# Patient Record
Sex: Female | Born: 1980 | Race: Black or African American | Hispanic: No | Marital: Married | Smoking: Former smoker
Health system: Southern US, Community
[De-identification: ages and names within clinical notes are randomized; demographics above are authoritative.]

## PROBLEM LIST (undated history)

## (undated) ENCOUNTER — Inpatient Hospital Stay (HOSPITAL_COMMUNITY): Payer: Self-pay

## (undated) DIAGNOSIS — D649 Anemia, unspecified: Secondary | ICD-10-CM

## (undated) DIAGNOSIS — G43909 Migraine, unspecified, not intractable, without status migrainosus: Secondary | ICD-10-CM

## (undated) DIAGNOSIS — R87619 Unspecified abnormal cytological findings in specimens from cervix uteri: Secondary | ICD-10-CM

## (undated) DIAGNOSIS — IMO0002 Reserved for concepts with insufficient information to code with codable children: Secondary | ICD-10-CM

## (undated) DIAGNOSIS — E669 Obesity, unspecified: Secondary | ICD-10-CM

## (undated) DIAGNOSIS — D219 Benign neoplasm of connective and other soft tissue, unspecified: Secondary | ICD-10-CM

## (undated) DIAGNOSIS — F329 Major depressive disorder, single episode, unspecified: Secondary | ICD-10-CM

## (undated) DIAGNOSIS — R87629 Unspecified abnormal cytological findings in specimens from vagina: Secondary | ICD-10-CM

## (undated) DIAGNOSIS — F419 Anxiety disorder, unspecified: Secondary | ICD-10-CM

## (undated) DIAGNOSIS — F32A Depression, unspecified: Secondary | ICD-10-CM

## (undated) DIAGNOSIS — E049 Nontoxic goiter, unspecified: Secondary | ICD-10-CM

## (undated) DIAGNOSIS — N83209 Unspecified ovarian cyst, unspecified side: Secondary | ICD-10-CM

## (undated) DIAGNOSIS — K219 Gastro-esophageal reflux disease without esophagitis: Secondary | ICD-10-CM

## (undated) HISTORY — DX: Unspecified abnormal cytological findings in specimens from cervix uteri: R87.619

## (undated) HISTORY — PX: LAPAROSCOPIC GASTRIC SLEEVE RESECTION: SHX5895

## (undated) HISTORY — DX: Migraine, unspecified, not intractable, without status migrainosus: G43.909

## (undated) HISTORY — DX: Reserved for concepts with insufficient information to code with codable children: IMO0002

## (undated) HISTORY — DX: Obesity, unspecified: E66.9

## (undated) HISTORY — DX: Anxiety disorder, unspecified: F41.9

---

## 2002-09-11 ENCOUNTER — Encounter: Admission: RE | Admit: 2002-09-11 | Discharge: 2002-09-11 | Payer: Self-pay | Admitting: Obstetrics and Gynecology

## 2002-09-11 ENCOUNTER — Encounter (INDEPENDENT_AMBULATORY_CARE_PROVIDER_SITE_OTHER): Payer: Self-pay | Admitting: *Deleted

## 2002-09-11 ENCOUNTER — Other Ambulatory Visit: Admission: RE | Admit: 2002-09-11 | Discharge: 2002-09-11 | Payer: Self-pay | Admitting: Obstetrics and Gynecology

## 2002-10-02 ENCOUNTER — Encounter: Admission: RE | Admit: 2002-10-02 | Discharge: 2002-10-02 | Payer: Self-pay | Admitting: Obstetrics and Gynecology

## 2003-04-14 ENCOUNTER — Emergency Department (HOSPITAL_COMMUNITY): Admission: EM | Admit: 2003-04-14 | Discharge: 2003-04-14 | Payer: Self-pay | Admitting: Emergency Medicine

## 2004-08-16 ENCOUNTER — Emergency Department (HOSPITAL_COMMUNITY): Admission: EM | Admit: 2004-08-16 | Discharge: 2004-08-16 | Payer: Self-pay | Admitting: Emergency Medicine

## 2005-06-21 ENCOUNTER — Other Ambulatory Visit: Admission: RE | Admit: 2005-06-21 | Discharge: 2005-06-21 | Payer: Self-pay | Admitting: Obstetrics and Gynecology

## 2008-04-09 ENCOUNTER — Encounter (HOSPITAL_COMMUNITY): Admission: RE | Admit: 2008-04-09 | Discharge: 2008-04-10 | Payer: Self-pay | Admitting: Internal Medicine

## 2008-09-19 ENCOUNTER — Emergency Department (HOSPITAL_COMMUNITY): Admission: EM | Admit: 2008-09-19 | Discharge: 2008-09-19 | Payer: Self-pay | Admitting: *Deleted

## 2010-04-08 ENCOUNTER — Emergency Department (HOSPITAL_COMMUNITY): Admission: EM | Admit: 2010-04-08 | Discharge: 2010-04-08 | Payer: Self-pay | Admitting: Family Medicine

## 2010-04-21 ENCOUNTER — Emergency Department (HOSPITAL_COMMUNITY): Admission: EM | Admit: 2010-04-21 | Discharge: 2010-04-22 | Payer: Self-pay | Admitting: Emergency Medicine

## 2011-09-19 LAB — ACETAMINOPHEN LEVEL: Acetaminophen (Tylenol), Serum: <4 — ABNORMAL LOW

## 2011-09-19 LAB — POCT I-STAT, CHEM 8
BUN: 9
Calcium, Ion: 1.24
HCT: 40
Sodium: 140

## 2011-09-19 LAB — RAPID URINE DRUG SCREEN, HOSP PERFORMED
Amphetamines: NOT DETECTED
Barbiturates: NOT DETECTED
Benzodiazepines: NOT DETECTED
Cocaine: NOT DETECTED
Opiates: NOT DETECTED
Tetrahydrocannabinol: NOT DETECTED

## 2011-09-19 LAB — SALICYLATE LEVEL: Salicylate Lvl: <10

## 2011-09-19 LAB — POCT PREGNANCY, URINE: Preg Test, Ur: NEGATIVE

## 2011-09-19 LAB — TRICYCLICS SCREEN, URINE: TCA Scrn: NOT DETECTED

## 2012-10-04 ENCOUNTER — Ambulatory Visit (INDEPENDENT_AMBULATORY_CARE_PROVIDER_SITE_OTHER): Payer: BC Managed Care – PPO | Admitting: Family Medicine

## 2012-10-04 VITALS — Temp 98.1°F

## 2012-10-04 DIAGNOSIS — Z23 Encounter for immunization: Secondary | ICD-10-CM

## 2013-06-17 LAB — T4
T4, Total: 8.9
TSH: 0.23

## 2013-07-17 ENCOUNTER — Encounter: Payer: Self-pay | Admitting: Physician Assistant

## 2013-07-17 ENCOUNTER — Ambulatory Visit (INDEPENDENT_AMBULATORY_CARE_PROVIDER_SITE_OTHER): Payer: BC Managed Care – PPO | Admitting: Physician Assistant

## 2013-07-17 VITALS — BP 120/80 | HR 97 | Temp 98.0°F | Resp 16 | Ht 60.0 in | Wt 207.0 lb

## 2013-07-17 DIAGNOSIS — M62838 Other muscle spasm: Secondary | ICD-10-CM

## 2013-07-17 DIAGNOSIS — M357 Hypermobility syndrome: Secondary | ICD-10-CM

## 2013-07-17 DIAGNOSIS — E0590 Thyrotoxicosis, unspecified without thyrotoxic crisis or storm: Secondary | ICD-10-CM

## 2013-07-17 DIAGNOSIS — F419 Anxiety disorder, unspecified: Secondary | ICD-10-CM | POA: Insufficient documentation

## 2013-07-17 DIAGNOSIS — T148XXA Other injury of unspecified body region, initial encounter: Secondary | ICD-10-CM

## 2013-07-17 DIAGNOSIS — E669 Obesity, unspecified: Secondary | ICD-10-CM | POA: Insufficient documentation

## 2013-07-17 DIAGNOSIS — G43909 Migraine, unspecified, not intractable, without status migrainosus: Secondary | ICD-10-CM | POA: Insufficient documentation

## 2013-07-17 MED ORDER — MELOXICAM 15 MG PO TABS: 15.0000 mg | ORAL_TABLET | Freq: Every day | ORAL | Status: DC

## 2013-07-17 MED ORDER — HYDROCODONE-ACETAMINOPHEN 5-325 MG PO TABS: 1.0000 | ORAL_TABLET | Freq: Four times a day (QID) | ORAL | Status: DC | PRN

## 2013-07-17 NOTE — Progress Notes (Signed)
Subjective:    Patient ID: Jessica Banks, female    DOB: [birth year], 32 y.o.   MRN: [id number]  HPI 32 y.o. Female presents to clinic today for follow up on abnormal TSH and evaluation of muscle spasms.   Pts TSH was drawn on 06/17/13 and was 0.230. Normal T4. Lab performed as part of workup for urticaria.  Pt does not complain of any chest pain, palpitations, heat or cold intolerance, difficulty urinating or moving her bowels, hot flashes, skin changes, menstrual problems, or changes in vision. She does have a significant family history of hyperthyroidism. Patient expresses that she has always had a palpable thyroid but this was first time her labs have been abnormal.   Patient was doing crossfit 4 days ago and states that she was doing a new exercise and went home and her arm started hurting. She then went to a workout the next day that required similar exercises and her arm has continued to get more painful. She took some Flexeril and 800 mg of Motrin with only mild relief. Patient is not able to use left arm due to pain and muscle spasms.   Patient doing well otherwise. She follows Neurologist for migraine care.    Review of Systems  Constitutional: Negative for fever, chills, activity change, appetite change, fatigue and unexpected weight change.  Eyes: Negative for visual disturbance.  Respiratory: Negative for cough, chest tightness and shortness of breath.   Cardiovascular: Negative for chest pain, palpitations and leg swelling.  Gastrointestinal: Negative for nausea, vomiting, abdominal pain, diarrhea and constipation.  Genitourinary: Negative for difficulty urinating and menstrual problem.  Musculoskeletal: Positive for myalgias. Negative for arthralgias.  Skin: Negative for pallor and rash.  Neurological: Positive for headaches (history of migraines). Negative for speech difficulty, weakness and light-headedness.  Psychiatric/Behavioral: Negative for sleep disturbance.  All  other systems reviewed and are negative.  Past medical history, surgical history, family history, social history and problem list reviewed.      Objective:   Physical Exam  Nursing note and vitals reviewed. Constitutional: She is oriented to person, place, and time. She appears well-developed and well-nourished. No distress.  HENT:  Head: Normocephalic and atraumatic.  Right Ear: External ear normal.  Left Ear: External ear normal.  Eyes: Conjunctivae are normal.  Neck: Normal range of motion. Neck supple. Thyromegaly present.  Cardiovascular: Normal rate, regular rhythm, normal heart sounds and intact distal pulses.   Pulmonary/Chest: Effort normal and breath sounds normal.  Musculoskeletal:       Left shoulder: She exhibits decreased range of motion (2nd to pain and muscle spasms), pain and spasm. She exhibits no tenderness, no swelling, normal pulse and normal strength.       Left upper arm: She exhibits tenderness (2nd to muslce spasms).  Lymphadenopathy:    She has no cervical adenopathy.  Neurological: She is alert and oriented to person, place, and time. She has normal strength. No cranial nerve deficit or sensory deficit.  Skin: Skin is warm and dry. She is not diaphoretic.  Psychiatric: She has a normal mood and affect. Her speech is normal and behavior is normal. Judgment and thought content normal. Cognition and memory are normal.        Assessment & Plan:  Hyperthyroidism - Plan: US Soft Tissue Head/Neck, TSH. Will contact patient with results. Patient referred for [place] and once results are will discuss next step in care with patient .   Muscle strain/spasms - Plan: meloxicam (MOBIC) 15 MG tablet, HYDROcodone-acetaminophen (  NORCO) 5-325 MG per tablet. Pt to continue using Flexeril before bed time for the next week. She was instructed to rest and not perform any upper body crossfit exercises for the next week until her arm is better. Advised use of warm compresses on the  sore areas and told her that a massage would greatly help.  F/u as needed or if pain continues in left arm. Will contact her with her lab results.

## 2013-07-17 NOTE — Patient Instructions (Addendum)
Hold off on upper body work until your pain is resolved. Take the flexeril at bedtime. Use warm compresses on the sore areas. Get a massage!

## 2013-07-19 NOTE — Progress Notes (Signed)
I have examined this patient along with the student and agree.  

## 2013-07-25 ENCOUNTER — Other Ambulatory Visit: Payer: Self-pay

## 2013-12-07 ENCOUNTER — Ambulatory Visit (INDEPENDENT_AMBULATORY_CARE_PROVIDER_SITE_OTHER): Payer: BC Managed Care – PPO | Admitting: Physician Assistant

## 2013-12-07 VITALS — BP 122/78 | HR 104 | Temp 98.3°F | Resp 18 | Ht 60.0 in | Wt 209.0 lb

## 2013-12-07 DIAGNOSIS — L500 Allergic urticaria: Secondary | ICD-10-CM

## 2013-12-07 DIAGNOSIS — L282 Other prurigo: Secondary | ICD-10-CM

## 2013-12-07 MED ORDER — HYDROXYZINE HCL 25 MG PO TABS: 25.0000 mg | ORAL_TABLET | Freq: Three times a day (TID) | ORAL | Status: DC | PRN

## 2013-12-07 MED ORDER — PREDNISONE 20 MG PO TABS: ORAL_TABLET | ORAL | Status: DC

## 2013-12-07 MED ORDER — METHYLPREDNISOLONE SODIUM SUCC 125 MG IJ SOLR
125.0000 mg | Freq: Once | INTRAMUSCULAR | Status: AC
  Administered 2013-12-07: 125 mg via INTRAMUSCULAR

## 2013-12-07 NOTE — Progress Notes (Signed)
   Subjective:    Patient ID: Jessica Banks, female    DOB: August 11, 1981, 32 y.o.   MRN: [id number]  Rash Pertinent negatives include no fever, shortness of breath or vomiting.   32 year old female presents for evaluation of pruritic rash/hives x 2 days. Has hx of similar rash over the past few years. Has been seen by an allergist and had extensive allergy testing that was inconclusive. They were unable to identify a specific allergen, and she was diagnosed with "sensitive skin." No hx of anaphylaxis.   Current episode started yesterday with slight itching that she noticed when she woke up. Over the course of the day it got progressively worse and began spreading.  Has multiple whelps on bilateral arms and trunk.  Denies SOB, wheezing, lip/tongue swelling, or throat swelling.  She has taken Benadryl, Zyrtec and used OTC hydrocortisone cream.   Patient is otherwise doing well with no other concerns today.     Review of Systems  Constitutional: Negative for fever and chills.  HENT: Negative for trouble swallowing.   Respiratory: Negative for chest tightness, shortness of breath and wheezing.   Gastrointestinal: Negative for nausea and vomiting.  Skin: Positive for rash.       Objective:   Physical Exam  Constitutional: She is oriented to person, place, and time. She appears well-developed and well-nourished.  HENT:  Head: Normocephalic and atraumatic.  Right Ear: External ear normal.  Left Ear: External ear normal.  Eyes: Conjunctivae are normal.  Neck: Normal range of motion.  Cardiovascular: Normal rate.   Pulmonary/Chest: Effort normal.  Neurological: She is alert and oriented to person, place, and time.  Skin:  Diffuse urticaria over entire torso and bilateral arms. No vesicles, drainage, or pain  Psychiatric: She has a normal mood and affect. Her behavior is normal. Judgment and thought content normal.          Assessment & Plan:  Allergic urticaria - Plan:  methylPREDNISolone sodium succinate (SOLU-MEDROL) 125 mg/2 mL injection 125 mg, predniSONE (DELTASONE) 20 MG tablet, hydrOXYzine (ATARAX/VISTARIL) 25 MG tablet  Pruritic rash - Plan: predniSONE (DELTASONE) 20 MG tablet  Solumedrol 125 mg IM today Prednisone taper to begin tomorrow Continue Zyrtec daily. Change form Benadryl to Atarax at bedtime Follow up or go to ER with any acutely worsening sx's, or if she fails to improve.

## 2015-04-01 ENCOUNTER — Telehealth: Payer: Self-pay

## 2015-04-01 NOTE — Telephone Encounter (Signed)
Patient called into voicemail on Thursday at 812am wanting to know if we had received her faxed [facility] for her records, she stated that she had sent them 24 hours prior to the call. As of 9:43am I have not seen it. Will wait and see if it comes in later today.

## 2015-04-02 NOTE — Telephone Encounter (Signed)
Request has been received and records mailed on 03/31/15. Patient notified.

## 2015-12-19 HISTORY — PX: LAPAROSCOPIC GASTRIC SLEEVE RESECTION: SHX5895

## 2016-04-04 DIAGNOSIS — Z9884 Bariatric surgery status: Secondary | ICD-10-CM | POA: Insufficient documentation

## 2016-04-06 ENCOUNTER — Ambulatory Visit (INDEPENDENT_AMBULATORY_CARE_PROVIDER_SITE_OTHER): Payer: No Typology Code available for payment source | Admitting: Physician Assistant

## 2016-04-06 VITALS — BP 108/66 | HR 91 | Temp 97.9°F | Resp 18 | Ht 60.0 in | Wt 223.0 lb

## 2016-04-06 DIAGNOSIS — E860 Dehydration: Secondary | ICD-10-CM

## 2016-04-06 DIAGNOSIS — N390 Urinary tract infection, site not specified: Secondary | ICD-10-CM

## 2016-04-06 DIAGNOSIS — R82998 Other abnormal findings in urine: Secondary | ICD-10-CM

## 2016-04-06 DIAGNOSIS — R34 Anuria and oliguria: Secondary | ICD-10-CM | POA: Diagnosis not present

## 2016-04-06 DIAGNOSIS — Z9889 Other specified postprocedural states: Secondary | ICD-10-CM

## 2016-04-06 DIAGNOSIS — R109 Unspecified abdominal pain: Secondary | ICD-10-CM | POA: Diagnosis not present

## 2016-04-06 LAB — POCT URINALYSIS DIP (MANUAL ENTRY)
BILIRUBIN UA: NEGATIVE
GLUCOSE UA: NEGATIVE
GLUCOSE UA: NEGATIVE
Ketones, POC UA: NEGATIVE
NITRITE UA: NEGATIVE
NITRITE UA: NEGATIVE
Protein Ur, POC: NEGATIVE
SPEC GRAV UA: 1.025
SPEC GRAV UA: 1.02
UROBILINOGEN UA: 0.2
Urobilinogen, UA: 0.2
pH, UA: 5
pH, UA: 5.5

## 2016-04-06 LAB — COMPLETE METABOLIC PANEL WITH GFR
ALT: 9 U/L (ref 6–29)
AST: 14 U/L (ref 10–30)
Albumin: 3.8 g/dL (ref 3.6–5.1)
Alkaline Phosphatase: 100 U/L (ref 33–115)
BUN: 8 mg/dL (ref 7–25)
CHLORIDE: 106 mmol/L (ref 98–110)
CO2: 22 mmol/L (ref 20–31)
Calcium: 9.4 mg/dL (ref 8.6–10.2)
Creat: 0.6 mg/dL (ref 0.50–1.10)
Glucose, Bld: 90 mg/dL (ref 65–99)
POTASSIUM: 4 mmol/L (ref 3.5–5.3)
Sodium: 137 mmol/L (ref 135–146)
Total Bilirubin: 0.4 mg/dL (ref 0.2–1.2)
Total Protein: 6.7 g/dL (ref 6.1–8.1)

## 2016-04-06 LAB — POCT CBC
Granulocyte percent: 49.3 %G (ref 37–80)
HEMATOCRIT: 35.6 % — AB (ref 37.7–47.9)
HEMOGLOBIN: 12.2 g/dL (ref 12.2–16.2)
Lymph, poc: 2.3 (ref 0.6–3.4)
MCH: 29.3 pg (ref 27–31.2)
MCHC: 34.4 g/dL (ref 31.8–35.4)
MCV: 85.4 fL (ref 80–97)
MID (cbc): 0.4 (ref 0–0.9)
MPV: 8.5 fL (ref 0–99.8)
POC GRANULOCYTE: 2.6 (ref 2–6.9)
POC LYMPH PERCENT: 42.7 %L (ref 10–50)
POC MID %: 8 % (ref 0–12)
Platelet Count, POC: 253 10*3/uL (ref 142–424)
RBC: 4.17 M/uL (ref 4.04–5.48)
RDW, POC: 14.2 %
WBC: 5.3 10*3/uL (ref 4.6–10.2)

## 2016-04-06 LAB — POC MICROSCOPIC URINALYSIS (UMFC): MUCUS RE: ABSENT

## 2016-04-06 NOTE — Progress Notes (Signed)
04/07/2016 1:50 PM   DOB: December 26, 1980 / MRN: [id number]  SUBJECTIVE:  Jessica Banks is a 35 y.o. female presenting for right sided flank pain.  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  She feels that she is dehydrated and and thinks this is contributing to her side pain.  She had mentioned this to her surgeon at routine follow up and feels that he did not listen to her and advised her to drink more fluids.  She feels now that she is so far behind that she can't catch up.  She would like labs today to investigate why she feels so bad and why she may be having flank pain.   She is allergic to novocain.   She  has a past medical history of Anxiety; Migraine; Obesity; and Abnormal Pap smear.    She  reports that she quit smoking about 2 years ago. She does not have any smokeless tobacco history on file. She reports that she does not drink alcohol or use illicit drugs. She  reports that she currently engages in sexual activity. The patient  has no past surgical history on file.  Her family history includes Thyroid disease in her mother.  Review of Systems  Constitutional: Negative for fever and chills.  Cardiovascular: Negative for chest pain.  Gastrointestinal: Positive for abdominal pain. Negative for nausea, vomiting and diarrhea.  Genitourinary: Positive for flank pain. Negative for dysuria, urgency, frequency and hematuria.  Skin: Negative for rash.  Neurological: Positive for dizziness. Negative for headaches.    Problem list and medications reviewed and updated by myself where necessary, and exist elsewhere in the encounter.   OBJECTIVE:  BP 108/66 mmHg  Pulse 91  Temp(Src) 97.9 F (36.6 C)  Resp 18  Ht 5' (1.524 m)  Wt 223 lb (101.152 kg)  BMI 43.55 kg/m2  SpO2 98%  LMP 03/06/2016 (Approximate)  Physical Exam  Constitutional: She is oriented to person, place, and time. She appears well-nourished. No distress.  Eyes: EOM are normal. Pupils are equal, round,  and reactive to light.  Cardiovascular: Normal rate.   Pulmonary/Chest: Effort normal and breath sounds normal.  Abdominal: Bowel sounds are normal. She exhibits no distension.  Musculoskeletal: Normal range of motion.  Neurological: She is alert and oriented to person, place, and time. No cranial nerve deficit. Gait normal.  Skin: Skin is dry. She is not diaphoretic.  Psychiatric: She has a normal mood and affect.  Vitals reviewed.   Results for orders placed or performed in visit on 04/06/16 (from the past 72 hour(s))  POCT urinalysis dipstick     Status: Abnormal   Collection Time: 04/06/16  4:39 PM  Result Value Ref Range   Color, UA yellow yellow   Clarity, UA clear clear   Glucose, UA negative negative   Bilirubin, UA small (A) negative   Ketones, POC UA negative negative   Spec Grav, UA 1.025    Blood, UA trace-lysed (A) negative   pH, UA 5.0    Protein Ur, POC trace (A) negative   Urobilinogen, UA 0.2    Nitrite, UA Negative Negative   Leukocytes, UA Trace (A) Negative  POCT Microscopic Urinalysis ([facility])     Status: Abnormal   Collection Time: 04/06/16  4:44 PM  Result Value Ref Range   WBC,UR,HPF,POC Moderate (A) None WBC/hpf   RBC,UR,HPF,POC Few (A) None RBC/hpf   Bacteria Few (A) None, Too numerous to count   Mucus Absent Absent   Epithelial  Cells, UR Per Microscopy Few (A) None, Too numerous to count cells/hpf  COMPLETE METABOLIC PANEL WITH GFR     Status: None   Collection Time: 04/06/16  5:00 PM  Result Value Ref Range   Sodium 137 135 - 146 mmol/L   Potassium 4.0 3.5 - 5.3 mmol/L   Chloride 106 98 - 110 mmol/L   CO2 22 20 - 31 mmol/L   Glucose, Bld 90 65 - 99 mg/dL   BUN 8 7 - 25 mg/dL   Creat 6.16 8.15 - 8.10 mg/dL   Total Bilirubin 0.4 0.2 - 1.2 mg/dL   Alkaline Phosphatase 100 33 - 115 U/L   AST 14 10 - 30 U/L   ALT 9 6 - 29 U/L   Total Protein 6.7 6.1 - 8.1 g/dL   Albumin 3.8 3.6 - 5.1 g/dL   Calcium 9.4 8.6 - 04.2 mg/dL   GFR, Est African  American >89 >=60 mL/min   GFR, Est Non African American >89 >=60 mL/min    Comment:   The estimated GFR is a calculation valid for adults (>=36 years old) that uses the CKD-EPI algorithm to adjust for age and sex. It is   not to be used for children, pregnant women, hospitalized patients,    patients on dialysis, or with rapidly changing kidney function. According to the NKDEP, eGFR >89 is normal, 60-89 shows mild impairment, 30-59 shows moderate impairment, 15-29 shows severe impairment and <15 is ESRD.     POCT CBC     Status: Abnormal   Collection Time: 04/06/16  5:05 PM  Result Value Ref Range   WBC 5.3 4.6 - 10.2 K/uL   Lymph, poc 2.3 0.6 - 3.4   POC LYMPH PERCENT 42.7 10 - 50 %L   MID (cbc) 0.4 0 - 0.9   POC MID % 8.0 0 - 12 %M   POC Granulocyte 2.6 2 - 6.9   Granulocyte percent 49.3 37 - 80 %G   RBC 4.17 4.04 - 5.48 M/uL   Hemoglobin 12.2 12.2 - 16.2 g/dL   HCT, POC 24.0 (A) 82.0 - 47.9 %   MCV 85.4 80 - 97 fL   MCH, POC 29.3 27 - 31.2 pg   MCHC 34.4 31.8 - 35.4 g/dL   RDW, POC 64.9 %   Platelet Count, POC 253 142 - 424 K/uL   MPV 8.5 0 - 99.8 fL  POCT urinalysis dipstick     Status: Abnormal   Collection Time: 04/06/16  6:17 PM  Result Value Ref Range   Color, UA yellow yellow   Clarity, UA clear clear   Glucose, UA negative negative   Bilirubin, UA negative negative   Ketones, POC UA small (15) (A) negative   Spec Grav, UA 1.020    Blood, UA trace-lysed (A) negative   pH, UA 5.5    Protein Ur, POC negative negative   Urobilinogen, UA 0.2    Nitrite, UA Negative Negative   Leukocytes, UA small (1+) (A) Negative   Orthostatic VS for the past 24 hrs:  BP- Lying Pulse- Lying BP- Sitting Pulse- Sitting BP- Standing at 0 minutes Pulse- Standing at 0 minutes  04/06/16 1636 109/75 mmHg 82 131/83 mmHg 88 119/82 mmHg 92    No results found.  ASSESSMENT AND PLAN  Jessica Banks was seen today for flank pain and back pain.  Diagnoses and all orders for this  visit:  Flank pain: My workup has revealed very little aside from dehyrdation. Her flank  could be caused by a kidney stone, constipation, gall bladder disease, or possibly adhesions.  Her work up shows nothing urgent here.  Advised that she focus on hydration at home and start a bowel prep to get constipation off of the table.  If her pain continues and her urine culture comes back normal will order a CT of the abdomen to attempt to find an underlying cause.         -     POCT urinalysis dipstick -     POCT CBC -     COMPLETE METABOLIC PANEL WITH GFR -     POCT Microscopic Urinalysis ([facility])  S/P gastric surgery: See problem one.   Oligouria: See problem one.  -     Orthostatic vital signs  Dehydration: See problem one.   Urine leukocytes increased -     Urine culture -     POCT urinalysis dipstick    The patient was advised to call or return to clinic if she does not see an improvement in symptoms or to seek the care of the closest emergency department if she worsens with the above plan.   Philis Fendt, MHS, PA-C [facility] 04/07/2016 1:50 PM

## 2016-04-06 NOTE — Patient Instructions (Signed)
     IF you received an x-ray today, you will receive an invoice from [facility]. Please contact [facility] at [phone] with questions or concerns regarding your invoice.   IF you received labwork today, you will receive an invoice from [facility]. Please contact [facility] at [phone] with questions or concerns regarding your invoice.   Our billing staff will not be able to assist you with questions regarding bills from these companies.  You will be contacted with the lab results as soon as they are available. The fastest way to get your results is to activate your My Chart account. Instructions are located on the last page of this paperwork. If you have not heard from us regarding the results in 2 weeks, please contact this office.      

## 2016-04-07 LAB — URINE CULTURE
COLONY COUNT: NO GROWTH
Organism ID, Bacteria: NO GROWTH

## 2016-04-10 ENCOUNTER — Ambulatory Visit (INDEPENDENT_AMBULATORY_CARE_PROVIDER_SITE_OTHER): Payer: No Typology Code available for payment source | Admitting: Physician Assistant

## 2016-04-10 ENCOUNTER — Telehealth: Payer: Self-pay

## 2016-04-10 ENCOUNTER — Emergency Department (HOSPITAL_BASED_OUTPATIENT_CLINIC_OR_DEPARTMENT_OTHER): Payer: PRIVATE HEALTH INSURANCE

## 2016-04-10 ENCOUNTER — Encounter (HOSPITAL_BASED_OUTPATIENT_CLINIC_OR_DEPARTMENT_OTHER): Payer: Self-pay | Admitting: *Deleted

## 2016-04-10 ENCOUNTER — Emergency Department (HOSPITAL_BASED_OUTPATIENT_CLINIC_OR_DEPARTMENT_OTHER)
Admission: EM | Admit: 2016-04-10 | Discharge: 2016-04-10 | Disposition: A | Discharge status: Home / Self Care | Payer: PRIVATE HEALTH INSURANCE | Attending: Emergency Medicine | Admitting: Emergency Medicine

## 2016-04-10 VITALS — BP 122/80 | HR 94 | Temp 98.2°F | Resp 18 | Wt 224.0 lb

## 2016-04-10 DIAGNOSIS — R109 Unspecified abdominal pain: Secondary | ICD-10-CM

## 2016-04-10 DIAGNOSIS — R10A0 Flank pain, unspecified side: Secondary | ICD-10-CM

## 2016-04-10 DIAGNOSIS — Z87891 Personal history of nicotine dependence: Secondary | ICD-10-CM | POA: Diagnosis not present

## 2016-04-10 DIAGNOSIS — E860 Dehydration: Secondary | ICD-10-CM | POA: Diagnosis not present

## 2016-04-10 DIAGNOSIS — Z9889 Other specified postprocedural states: Secondary | ICD-10-CM

## 2016-04-10 DIAGNOSIS — E669 Obesity, unspecified: Secondary | ICD-10-CM | POA: Insufficient documentation

## 2016-04-10 DIAGNOSIS — M549 Dorsalgia, unspecified: Secondary | ICD-10-CM | POA: Diagnosis present

## 2016-04-10 DIAGNOSIS — R1011 Right upper quadrant pain: Secondary | ICD-10-CM | POA: Insufficient documentation

## 2016-04-10 LAB — POCT URINALYSIS DIP (MANUAL ENTRY)
BILIRUBIN UA: NEGATIVE
Blood, UA: NEGATIVE
Glucose, UA: NEGATIVE
LEUKOCYTES UA: NEGATIVE
Nitrite, UA: NEGATIVE
PH UA: 6
Protein Ur, POC: NEGATIVE
Spec Grav, UA: 1.02
Urobilinogen, UA: 1

## 2016-04-10 LAB — COMPREHENSIVE METABOLIC PANEL
ALT: 14 U/L (ref 14–54)
AST: 25 U/L (ref 15–41)
Albumin: 4.1 g/dL (ref 3.5–5.0)
Alkaline Phosphatase: 99 U/L (ref 38–126)
Anion gap: 11 (ref 5–15)
BILIRUBIN TOTAL: 0.5 mg/dL (ref 0.3–1.2)
BUN: 13 mg/dL (ref 6–20)
CO2: 18 mmol/L — ABNORMAL LOW (ref 22–32)
CREATININE: 0.7 mg/dL (ref 0.44–1.00)
Calcium: 9.4 mg/dL (ref 8.9–10.3)
Chloride: 107 mmol/L (ref 101–111)
Glucose, Bld: 88 mg/dL (ref 65–99)
POTASSIUM: 3.7 mmol/L (ref 3.5–5.1)
Sodium: 136 mmol/L (ref 135–145)
TOTAL PROTEIN: 8 g/dL (ref 6.5–8.1)

## 2016-04-10 LAB — POCT CBC
GRANULOCYTE PERCENT: 53.6 % (ref 37–80)
HCT, POC: 37.5 % — AB (ref 37.7–47.9)
Hemoglobin: 12.9 g/dL (ref 12.2–16.2)
Lymph, poc: 2.2 (ref 0.6–3.4)
MCH, POC: 29.4 pg (ref 27–31.2)
MCHC: 34.5 g/dL (ref 31.8–35.4)
MCV: 85.4 fL (ref 80–97)
MID (cbc): 0.4 (ref 0–0.9)
MPV: 8.8 fL (ref 0–99.8)
PLATELET COUNT, POC: 265 10*3/uL (ref 142–424)
POC Granulocyte: 3 (ref 2–6.9)
POC LYMPH %: 39.3 % (ref 10–50)
POC MID %: 7.1 %M (ref 0–12)
RBC: 4.39 M/uL (ref 4.04–5.48)
RDW, POC: 14.3 %
WBC: 5.6 10*3/uL (ref 4.6–10.2)

## 2016-04-10 LAB — LIPASE: LIPASE: 12 U/L (ref 7–60)

## 2016-04-10 LAB — LIPASE, BLOOD: LIPASE: 19 U/L (ref 11–51)

## 2016-04-10 LAB — POCT URINE PREGNANCY: PREG TEST UR: NEGATIVE

## 2016-04-10 MED ORDER — SULFAMETHOXAZOLE-TRIMETHOPRIM 800-160 MG PO TABS: 1.0000 | ORAL_TABLET | Freq: Two times a day (BID) | ORAL | Status: AC

## 2016-04-10 MED ORDER — SODIUM CHLORIDE 0.9 % IV BOLUS (SEPSIS)
1000.0000 mL | Freq: Once | INTRAVENOUS | Status: AC
  Administered 2016-04-10: 1000 mL via INTRAVENOUS

## 2016-04-10 MED ORDER — ONDANSETRON HCL 4 MG/2ML IJ SOLN
4.0000 mg | Freq: Once | INTRAMUSCULAR | Status: AC
  Administered 2016-04-10: 4 mg via INTRAVENOUS

## 2016-04-10 MED ORDER — SULFAMETHOXAZOLE-TRIMETHOPRIM 800-160 MG PO TABS
1.0000 | ORAL_TABLET | Freq: Once | ORAL | Status: AC
  Administered 2016-04-10: 1 via ORAL
  Filled 2016-04-10: qty 1

## 2016-04-10 MED ORDER — TRAMADOL HCL 50 MG PO TABS: 50.0000 mg | ORAL_TABLET | Freq: Three times a day (TID) | ORAL | Status: DC | PRN

## 2016-04-10 MED ORDER — SODIUM CHLORIDE 0.9 % IV BOLUS (SEPSIS)
1000.0000 mL | Freq: Once | INTRAVENOUS | Status: DC
Start: 2016-04-10 — End: 2016-04-11

## 2016-04-10 MED ORDER — ONDANSETRON HCL 4 MG/2ML IJ SOLN
INTRAMUSCULAR | Status: AC
  Filled 2016-04-10: qty 2

## 2016-04-10 MED ORDER — IOPAMIDOL (ISOVUE-300) INJECTION 61%
100.0000 mL | Freq: Once | INTRAVENOUS | Status: AC | PRN
  Administered 2016-04-10: 100 mL via INTRAVENOUS

## 2016-04-10 NOTE — ED Notes (Signed)
MD at bedside. 

## 2016-04-10 NOTE — Telephone Encounter (Signed)
Returned call and spoke with Coralyn Mark. She needed facility details, she was given [facility] imaging facility. Coralyn Mark will get that entered and this may take up to 72 hours. Philis Fendt PA-C advised. Called patient, left message, with  details

## 2016-04-10 NOTE — ED Notes (Signed)
IV attempts x2  1 each AC labs obtained right [facility] sent to lab post assessment each site WNL.

## 2016-04-10 NOTE — Telephone Encounter (Signed)
Jessica Banks is calling Jessica Banks back to do an emergency prior authorization. Please call! [phone]

## 2016-04-10 NOTE — Progress Notes (Signed)
04/10/2016 2:27 PM   DOB: 1981/04/30 / MRN: [id number]  SUBJECTIVE:  Jessica Banks is a 35 y.o. female presenting for a follow up of flank pain. She is roughly thee weeks out of bariatric surgery and reports the pain is progressively worsening.  Complains that the pain is constant, dull, and sometimes stabbing in nature.  Denies fever, chills, nausea.  She is a non drinker.  Reports she is moving her bowels without difficulty today and has been taking OTC laxatives with excellent results.  She is pushing PO fluids and is urinating 4-5 times daily and reports the urine is clear and without odor.    She is allergic to novocain.   She  has a past medical history of Anxiety; Migraine; Obesity; and Abnormal Pap smear.    She  reports that she quit smoking about 2 years ago. She does not have any smokeless tobacco history on file. She reports that she does not drink alcohol or use illicit drugs. She  reports that she currently engages in sexual activity. The patient  has no past surgical history on file.  Her family history includes Thyroid disease in her mother.  Review of Systems  Constitutional: Negative for fever and chills.  Respiratory: Negative for cough.   Cardiovascular: Negative for chest pain.  Gastrointestinal: Negative for diarrhea and constipation.  Genitourinary: Positive for flank pain. Negative for dysuria, urgency and frequency.  Skin: Negative for rash.  Neurological: Negative for dizziness and headaches.    Problem list and medications reviewed and updated by myself where necessary, and exist elsewhere in the encounter.   OBJECTIVE:  BP 122/80 mmHg  Pulse 94  Temp(Src) 98.2 F (36.8 C) (Oral)  Resp 18  Wt 224 lb (101.606 kg)  SpO2 96%  LMP 03/06/2016 (Approximate)  Physical Exam  Constitutional: She is oriented to person, place, and time. She appears well-nourished. No distress.  Eyes: EOM are normal. Pupils are equal, round, and reactive to light.    Cardiovascular: Normal rate, regular rhythm and normal heart sounds.   Pulmonary/Chest: Effort normal.  Abdominal: Soft. Bowel sounds are normal. She exhibits no distension and no mass. There is no tenderness. There is no rebound and no guarding.  Neurological: She is alert and oriented to person, place, and time. No cranial nerve deficit. Gait normal.  Skin: Skin is dry. She is not diaphoretic.  Psychiatric: She has a normal mood and affect.  Vitals reviewed.   Results for orders placed or performed in visit on 04/10/16 (from the past 72 hour(s))  POCT urinalysis dipstick     Status: Abnormal   Collection Time: 04/10/16  2:24 PM  Result Value Ref Range   Color, UA yellow yellow   Clarity, UA clear clear   Glucose, UA negative negative   Bilirubin, UA negative negative   Ketones, POC UA small (15) (A) negative   Spec Grav, UA 1.020    Blood, UA negative negative   pH, UA 6.0    Protein Ur, POC negative negative   Urobilinogen, UA 1.0    Nitrite, UA Negative Negative   Leukocytes, UA Negative Negative  POCT CBC     Status: Abnormal   Collection Time: 04/10/16  2:24 PM  Result Value Ref Range   WBC 5.6 4.6 - 10.2 K/uL   Lymph, poc 2.2 0.6 - 3.4   POC LYMPH PERCENT 39.3 10 - 50 %L   MID (cbc) 0.4 0 - 0.9   POC MID % 7.1 0 -  12 %M   POC Granulocyte 3.0 2 - 6.9   Granulocyte percent 53.6 37 - 80 %G   RBC 4.39 4.04 - 5.48 M/uL   Hemoglobin 12.9 12.2 - 16.2 g/dL   HCT, POC 37.5 (A) 37.7 - 47.9 %   MCV 85.4 80 - 97 fL   MCH, POC 29.4 27 - 31.2 pg   MCHC 34.5 31.8 - 35.4 g/dL   RDW, POC 14.3 %   Platelet Count, POC 265 142 - 424 K/uL   MPV 8.8 0 - 99.8 fL  POCT urine pregnancy     Status: None   Collection Time: 04/10/16  2:26 PM  Result Value Ref Range   Preg Test, Ur Negative Negative    No results found.  ASSESSMENT AND PLAN  Jessica Banks was seen today for follow-up.  Diagnoses and all orders for this visit:  Flank pain -     Lipase -     POCT urinalysis  dipstick -     POCT CBC -     POCT urine pregnancy -     traMADol (ULTRAM) 50 MG tablet; Take 1-2 tablets (50-100 mg total) by mouth every 8 (eight) hours as needed. Okay to take with 1000 mg of tylenol every 8 hours. -     CT Abdomen Pelvis W Contrast; Future  Status post gastric surgery: I am concerned that her pain is worsening and I can not identify any particular etiology.  Will send her for a stat CT scan of the abdomen and pelvis.  This may reveal gall bladder disease, pancreatitis, or an unknown etiology related to the surgery.  -     CT Abdomen Pelvis W Contrast; Future  Other orders -     Discontinue: traMADol (ULTRAM) 50 MG tablet; Take 1 tablet (50 mg total) by mouth every 8 (eight) hours as needed.    The patient was advised to call or return to clinic if she does not see an improvement in symptoms or to seek the care of the closest emergency department if she worsens with the above plan.   Philis Fendt, MHS, PA-C [facility] 04/10/2016 2:27 PM

## 2016-04-10 NOTE — ED Notes (Signed)
Gastric sleeve surgery 4 weeks ago. Pain in her right upper quadrant.

## 2016-04-10 NOTE — ED Provider Notes (Signed)
CSN: [id number]     Arrival date & time 04/10/16  1814 History  By signing my name below, I, Doran Stabler, attest that this documentation has been prepared under the direction and in the presence of Julianne Rice, MD. Electronically Signed: Doran Stabler, ED Scribe. 04/10/2016. 8:30 PM.   Chief Complaint  Patient presents with  . Back Pain   The history is provided by the patient. No language interpreter was used.   HPI Comments: Jessica Banks is a 35 y.o. female who presents to the Emergency Department complaining of persistent RUQ pain s/p gastric bypass surgery last month. Pt states she had a gastric bypass surgery on 3/29//17. SheHas had persistent pain around the incision site in the right upper quadrant. She states the pain is constant and rates it 2/10. She also has episodic sharp pain that radiates to her right flank. Her pain is worse when she sits up. Denies hematuria, frequency, urgency or dysuria. States she's having regular bowel movements with no blood or mucus. Pt presents today with persistent pain. Pt denies any fevers, chills, cough, SOB, N/V/D or any other symptoms at this time.   Past Medical History  Diagnosis Date  . Anxiety   . Migraine   . Obesity   . Abnormal Pap smear     CIN I   Past Surgical History  Procedure Laterality Date  . Laparoscopic gastric sleeve resection     Family History  Problem Relation Age of Onset  . Thyroid disease Mother     s/p thyroidectomy   Social History  Substance Use Topics  . Smoking status: Former Smoker    Quit date: 06/07/2013  . Smokeless tobacco: None  . Alcohol Use: No   OB History    No data available     Review of Systems  Constitutional: Negative for fever and chills.  Respiratory: Negative for shortness of breath.   Cardiovascular: Negative for chest pain.  Gastrointestinal: Positive for abdominal pain. Negative for nausea, vomiting, diarrhea, constipation and blood in stool.  Genitourinary:  Positive for flank pain. Negative for dysuria, frequency, hematuria, vaginal bleeding, vaginal discharge and pelvic pain.  Musculoskeletal: Positive for myalgias and back pain. Negative for arthralgias, neck pain and neck stiffness.  Skin: Negative for rash and wound.  Neurological: Negative for dizziness, weakness, light-headedness, numbness and headaches.  All other systems reviewed and are negative.   Allergies  Novocain  Home Medications   Prior to Admission medications   Medication Sig Start Date End Date Taking? Authorizing Provider  cyclobenzaprine (FLEXERIL) 10 MG tablet Take 10 mg by mouth as needed for muscle spasms.    Historical Provider, MD  flurbiprofen (ANSAID) 100 MG tablet Take 100 mg by mouth 2 (two) times daily as needed.    Historical Provider, MD  HYDROcodone-acetaminophen (NORCO) 5-325 MG per tablet Take 1 tablet by mouth every 6 (six) hours as needed for pain. 07/17/13   Chelle Jeffery, PA-C  Levonorgestrel (SKYLA) 13.5 MG IUD by Intrauterine route.    Historical Provider, MD  phentermine 37.5 MG capsule Take 37.5 mg by mouth every morning. Reported on 04/06/2016    Historical Provider, MD  promethazine (PHENERGAN) 25 MG tablet Take 25 mg by mouth every 6 (six) hours as needed for nausea. Reported on 04/06/2016    Historical Provider, MD  rizatriptan (MAXALT) 10 MG tablet Take 10 mg by mouth as needed for migraine. Reported on 04/06/2016    Historical Provider, MD  sulfamethoxazole-trimethoprim (BACTRIM DS,SEPTRA DS) 800-160 MG  tablet Take 1 tablet by mouth 2 (two) times daily. 04/10/16 04/17/16  Julianne Rice, MD  traMADol (ULTRAM) 50 MG tablet Take 1-2 tablets (50-100 mg total) by mouth every 8 (eight) hours as needed. Okay to take with 1000 mg of tylenol every 8 hours. 04/10/16   Tereasa Coop, PA-C   BP 149/89 mmHg  Pulse 102  Temp(Src) 98.4 F (36.9 C) (Oral)  Resp 16  SpO2 100%  LMP 03/06/2016 (Approximate) Physical Exam  Constitutional: She is oriented to  person, place, and time. She appears well-developed and well-nourished. No distress.  HENT:  Head: Normocephalic and atraumatic.  Mouth/Throat: Oropharynx is clear and moist. No oropharyngeal exudate.  Eyes: EOM are normal. Pupils are equal, round, and reactive to light.  Neck: Normal range of motion. Neck supple.  Cardiovascular: Normal rate and regular rhythm.   Pulmonary/Chest: Effort normal and breath sounds normal. No respiratory distress. She has no wheezes. She has no rales. She exhibits no tenderness.  Abdominal: Soft. Bowel sounds are normal. She exhibits no distension and no mass. There is tenderness. There is no rebound and no guarding.  Patient with 4 surgical scars that appear to be well healing. She is tender to palpation over the scar in the right upper quadrant. There is no obvious warmth, redness or swelling. No rebound, guarding or tenderness.  Musculoskeletal: Normal range of motion. She exhibits no edema or tenderness.  No CVA tenderness bilaterally. No midline thoracic or lumbar tenderness. No lower extremity swelling, asymmetry.  Neurological: She is alert and oriented to person, place, and time.  Patient is alert and oriented x3 with clear, goal oriented speech. Patient has 5/5 motor in all extremities. Sensation is intact to light touch.  Skin: Skin is warm and dry. No rash noted. No erythema.  Psychiatric: She has a normal mood and affect. Her behavior is normal.  Nursing note and vitals reviewed.   ED Course  Procedures   DIAGNOSTIC STUDIES: Oxygen Saturation is 100% on room air, normal by my interpretation.    COORDINATION OF CARE: 8:14 PM Will give fluids. Will order CTA abdomen and blood work. Discussed treatment plan with pt at bedside and pt agreed to plan.  Labs Review Labs Reviewed  COMPREHENSIVE METABOLIC PANEL - Abnormal; Notable for the following:    CO2 18 (*)    All other components within normal limits  LIPASE, BLOOD    Imaging Review Ct  Abdomen Pelvis W Contrast  04/10/2016  CLINICAL DATA:  Patient status post gastric sleeve 03/15/2016. Right flank and lower abdominal pain. EXAM: CT ABDOMEN AND PELVIS WITH CONTRAST TECHNIQUE: Multidetector CT imaging of the abdomen and pelvis was performed using the standard protocol following bolus administration of intravenous contrast. CONTRAST:  169mL ISOVUE-300 IOPAMIDOL (ISOVUE-300) INJECTION 61% COMPARISON:  None. FINDINGS: Lower chest:  Lower lobes are clear.  No pleural effusion. Hepatobiliary: The hepatic dome is not included on current evaluation. The visualized liver is unremarkable. Gallbladder is unremarkable. Pancreas: Unremarkable Spleen: Unremarkable Adrenals/Urinary Tract: The adrenal glands are normal. Kidneys enhance symmetrically with contrast. Too small to characterize low-attenuation lesion interpolar region left kidney (image 18; series 2). Stomach/Bowel: No abnormal bowel wall thickening or evidence for bowel obstruction. The appendix is normal. No free intraperitoneal air. Postsurgical changes along the greater curvature of the stomach compatible with gastric sleeve procedure. Vascular/Lymphatic: Normal caliber abdominal aorta. 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 No retroperitoneal adenopathy. Other: Intrauterine device is present. Moderate amount of  free fluid in the pelvis. Ovaries are unremarkable. Left ovarian cyst. Musculoskeletal: No aggressive or acute appearing osseous lesions. IMPRESSION: Moderate amount of free fluid in the pelvis which is nonspecific and may be secondary to recent pelvic cyst rupture. Normal appendix. No hydronephrosis. 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 Electronically Signed   By: Lovey Newcomer M.D.   On: 04/10/2016 22:51   I have personally reviewed and evaluated these images and lab results as part of my medical decision-making.   EKG Interpretation None       MDM   Final diagnoses:  Right upper quadrant pain  Dehydration    I personally performed the services described in this documentation, which was scribed in my presence. The recorded information has been reviewed and is accurate.   Bicarbonate is low which may indicate some degree of dehydration. Given IV fluids in the emergency department. CT scan obtained no significant intra-abdominal finding. Patient does have some free fluid in the pelvis which is nonspecific. Patient does have some fat stranding in the anterior abdominal wall which appears consistent with the surgical site where her tenderness lies. Suspect possible abdominal wall cellulitis. We'll start on antibiotics. Patient will follow-up with her surgeon and primary physician. Return precautions have been given.   Julianne Rice, MD 04/10/16 [phone]

## 2016-04-10 NOTE — Patient Instructions (Signed)
     IF you received an x-ray today, you will receive an invoice from [facility]. Please contact [facility] at [phone] with questions or concerns regarding your invoice.   IF you received labwork today, you will receive an invoice from [facility]. Please contact [facility] at [phone] with questions or concerns regarding your invoice.   Our billing staff will not be able to assist you with questions regarding bills from these companies.  You will be contacted with the lab results as soon as they are available. The fastest way to get your results is to activate your My Chart account. Instructions are located on the last page of this paperwork. If you have not heard from us regarding the results in 2 weeks, please contact this office.      

## 2016-04-10 NOTE — ED Notes (Signed)
Pt. Reports gastric bypass surgery last month and has been seen for post op visit with reports to her Surgeon about the pain that is ongoing in the R  Side.  Pt. Reports no nausea or vomiting with this pain.

## 2016-04-10 NOTE — ED Notes (Signed)
IV attempted in bilateral AC without success. Bobby RN in to attempt placement.

## 2016-04-10 NOTE — Discharge Instructions (Signed)

## 2016-12-18 NOTE — L&D Delivery Note (Signed)
Delivery Note At 7:12 PM a viable and healthy female was delivered via Vaginal, Spontaneous Delivery (Presentation:vtx ; ROA ).  APGAR: 7, 9; weight  pending.   Placenta status: spontaneous intact not sent, .  Cord: around leg x1 with the following complications: .  Cord pH: none  Anesthesia:  epidural Episiotomy: None Lacerations: 2nd degree perineal, labia minora lacerations ( R>L) Suture Repair: 3.0 chromic Est. Blood Loss (mL):  200  Mom to postpartum.  Baby to Couplet care / Skin to Skin.  Kelen Laura A 10/17/2017, 8:03 PM

## 2017-02-06 DIAGNOSIS — F411 Generalized anxiety disorder: Secondary | ICD-10-CM | POA: Diagnosis not present

## 2017-02-23 DIAGNOSIS — Z3201 Encounter for pregnancy test, result positive: Secondary | ICD-10-CM | POA: Diagnosis not present

## 2017-02-23 DIAGNOSIS — O26859 Spotting complicating pregnancy, unspecified trimester: Secondary | ICD-10-CM | POA: Diagnosis not present

## 2017-02-23 DIAGNOSIS — Z8759 Personal history of other complications of pregnancy, childbirth and the puerperium: Secondary | ICD-10-CM | POA: Diagnosis not present

## 2017-03-23 DIAGNOSIS — Z8759 Personal history of other complications of pregnancy, childbirth and the puerperium: Secondary | ICD-10-CM | POA: Diagnosis not present

## 2017-04-03 DIAGNOSIS — F411 Generalized anxiety disorder: Secondary | ICD-10-CM | POA: Diagnosis not present

## 2017-04-05 DIAGNOSIS — R875 Abnormal microbiological findings in specimens from female genital organs: Secondary | ICD-10-CM | POA: Diagnosis not present

## 2017-04-05 DIAGNOSIS — O09521 Supervision of elderly multigravida, first trimester: Secondary | ICD-10-CM | POA: Diagnosis not present

## 2017-04-05 DIAGNOSIS — Z113 Encounter for screening for infections with a predominantly sexual mode of transmission: Secondary | ICD-10-CM | POA: Diagnosis not present

## 2017-04-05 DIAGNOSIS — R7309 Other abnormal glucose: Secondary | ICD-10-CM | POA: Diagnosis not present

## 2017-04-05 DIAGNOSIS — O219 Vomiting of pregnancy, unspecified: Secondary | ICD-10-CM | POA: Diagnosis not present

## 2017-04-05 DIAGNOSIS — Z3A10 10 weeks gestation of pregnancy: Secondary | ICD-10-CM | POA: Diagnosis not present

## 2017-04-05 DIAGNOSIS — Z3689 Encounter for other specified antenatal screening: Secondary | ICD-10-CM | POA: Diagnosis not present

## 2017-04-05 LAB — OB RESULTS CONSOLE RUBELLA ANTIBODY, IGM: RUBELLA: IMMUNE

## 2017-04-05 LAB — OB RESULTS CONSOLE RPR: RPR: NONREACTIVE

## 2017-04-05 LAB — OB RESULTS CONSOLE HEPATITIS B SURFACE ANTIGEN: HEP B S AG: NEGATIVE

## 2017-04-05 LAB — OB RESULTS CONSOLE GC/CHLAMYDIA
Chlamydia: NEGATIVE
Gonorrhea: NEGATIVE

## 2017-04-05 LAB — OB RESULTS CONSOLE HIV ANTIBODY (ROUTINE TESTING): HIV: NONREACTIVE

## 2017-04-14 DIAGNOSIS — R102 Pelvic and perineal pain: Secondary | ICD-10-CM | POA: Diagnosis not present

## 2017-04-14 DIAGNOSIS — R309 Painful micturition, unspecified: Secondary | ICD-10-CM | POA: Diagnosis not present

## 2017-04-14 DIAGNOSIS — Z331 Pregnant state, incidental: Secondary | ICD-10-CM | POA: Diagnosis not present

## 2017-04-14 DIAGNOSIS — B373 Candidiasis of vulva and vagina: Secondary | ICD-10-CM | POA: Diagnosis not present

## 2017-04-14 DIAGNOSIS — O26899 Other specified pregnancy related conditions, unspecified trimester: Secondary | ICD-10-CM | POA: Diagnosis not present

## 2017-04-28 ENCOUNTER — Inpatient Hospital Stay (HOSPITAL_COMMUNITY)
Admission: AD | Admit: 2017-04-28 | Discharge: 2017-04-28 | Disposition: A | Discharge status: Home / Self Care | Payer: BLUE CROSS/BLUE SHIELD | Source: Ambulatory Visit | Attending: Obstetrics and Gynecology | Admitting: Obstetrics and Gynecology

## 2017-04-28 ENCOUNTER — Encounter (HOSPITAL_COMMUNITY): Payer: Self-pay | Admitting: *Deleted

## 2017-04-28 DIAGNOSIS — Z79899 Other long term (current) drug therapy: Secondary | ICD-10-CM | POA: Diagnosis not present

## 2017-04-28 DIAGNOSIS — Z87891 Personal history of nicotine dependence: Secondary | ICD-10-CM | POA: Insufficient documentation

## 2017-04-28 DIAGNOSIS — Z3A13 13 weeks gestation of pregnancy: Secondary | ICD-10-CM | POA: Diagnosis not present

## 2017-04-28 DIAGNOSIS — O208 Other hemorrhage in early pregnancy: Secondary | ICD-10-CM | POA: Insufficient documentation

## 2017-04-28 DIAGNOSIS — O98311 Other infections with a predominantly sexual mode of transmission complicating pregnancy, first trimester: Secondary | ICD-10-CM | POA: Diagnosis not present

## 2017-04-28 DIAGNOSIS — N72 Inflammatory disease of cervix uteri: Secondary | ICD-10-CM

## 2017-04-28 DIAGNOSIS — O4692 Antepartum hemorrhage, unspecified, second trimester: Secondary | ICD-10-CM

## 2017-04-28 DIAGNOSIS — A5901 Trichomonal vulvovaginitis: Secondary | ICD-10-CM

## 2017-04-28 HISTORY — DX: Unspecified ovarian cyst, unspecified side: N83.209

## 2017-04-28 HISTORY — DX: Depression, unspecified: F32.A

## 2017-04-28 HISTORY — DX: Unspecified abnormal cytological findings in specimens from vagina: R87.629

## 2017-04-28 HISTORY — DX: Nontoxic goiter, unspecified: E04.9

## 2017-04-28 HISTORY — DX: Benign neoplasm of connective and other soft tissue, unspecified: D21.9

## 2017-04-28 HISTORY — DX: Anemia, unspecified: D64.9

## 2017-04-28 HISTORY — DX: Major depressive disorder, single episode, unspecified: F32.9

## 2017-04-28 LAB — WET PREP, GENITAL
CLUE CELLS WET PREP: NONE SEEN
SPERM: NONE SEEN
Yeast Wet Prep HPF POC: NONE SEEN

## 2017-04-28 MED ORDER — METRONIDAZOLE 500 MG PO TABS
2000.0000 mg | ORAL_TABLET | Freq: Once | ORAL | Status: AC
  Administered 2017-04-28: 2000 mg via ORAL
  Filled 2017-04-28: qty 4

## 2017-04-28 NOTE — Discharge Instructions (Signed)
n  Trichomoniasis Trichomoniasis is an STI (sexually transmitted infection) that can affect both women and men. In women, the outer area of the female genitalia (vulva) and the vagina are affected. In men, the penis is mainly affected, but the prostate and other reproductive organs can also be involved. This condition can be treated with medicine. It often has no symptoms (is asymptomatic), especially in men. What are the causes? This condition is caused by an organism called Trichomonas vaginalis. Trichomoniasis most often spreads from person to person (is contagious) through sexual contact. What increases the risk? The following factors may make you more likely to develop this condition:  Having unprotected sexual intercourse.  Having sexual intercourse with a partner who has trichomoniasis.  Having multiple sexual partners.  Having had previous trichomoniasis infections or other STIs. What are the signs or symptoms? In women, symptoms of trichomoniasis include:  Abnormal vaginal discharge that is clear, white, gray, or yellow-green and foamy and has an unusual "fishy" odor.  Itching and irritation of the vagina and vulva.  Burning or pain during urination or sexual intercourse.  Genital redness and swelling. In men, symptoms of trichomoniasis include:  Penile discharge that may be foamy or contain pus.  Pain in the penis. This may happen only when urinating.  Itching or irritation inside the penis.  Burning after urination or ejaculation. How is this diagnosed? In women, this condition may be found during a routine Pap test or physical exam. It may be found in men during a routine physical exam. Your health care provider may perform tests to help diagnose this infection, such as:  Urine tests (men and women).  The following in women:  Testing the pH of the vagina.  A vaginal swab test that checks for the Trichomonas vaginalis organism.  Testing vaginal  secretions. Your health care provider may test you for other STIs, including HIV (human immunodeficiency virus). How is this treated? This condition is treated with medicine taken by mouth (orally), such as metronidazole or tinidazole to fight the infection. Your sexual partner(s) may also need to be tested and treated.  If you are a woman and you plan to become pregnant or think you may be pregnant, tell your health care provider right away. Some medicines that are used to treat the infection should not be taken during pregnancy. Your health care provider may recommend over-the-counter medicines or creams to help relieve itching or irritation. You may be tested for infection again 3 months after treatment. Follow these instructions at home:  Take and use over-the-counter and prescription medicines, including creams, only as told by your health care provider.  Do not have sexual intercourse until one week after you finish your medicine, or until your health care provider approves. Ask your health care provider when you may resume sexual intercourse.  (Women) Do not douche or wear tampons while you have the infection.  Discuss your infection with your sexual partner(s). Make sure that your partner gets tested and treated, if necessary.  Keep all follow-up visits as told by your health care provider. This is important. How is this prevented?  Use condoms every time you have sex. Using condoms correctly and consistently can help protect against STIs.  Avoid having multiple sexual partners.  Talk with your sexual partner about any symptoms that either of you may have, as well as any history of STIs.  Get tested for STIs and STDs (sexually transmitted diseases) before you have sex. Ask your partner to do the  same.  Do not have sexual contact if you have symptoms of trichomoniasis or another STI.   Contact a health care provider if:  You still have symptoms after you finish your  medicine.  You develop pain in your abdomen.  You have pain when you urinate.  You have bleeding after sexual intercourse.  You develop a rash.  You feel nauseous or you vomit.  You plan to become pregnant or think you may be pregnant. Summary  Trichomoniasis is an STI (sexually transmitted infection) that can affect both women and men.  This condition often has no symptoms (is asymptomatic), especially in men.  You should not have sexual intercourse until one week after you finish your medicine, or until your health care provider approves. Ask your health care provider when you may resume sexual intercourse.  Discuss your infection with your sexual partner. Make sure that your partner gets tested and treated, if necessary. This information is not intended to replace advice given to you by your health care provider. Make sure you discuss any questions you have with your health care provider. Document Released: 05/30/2001 Document Revised: 10/27/2016 Document Reviewed: 10/27/2016 Elsevier Interactive Patient Education  2017 [employer].

## 2017-04-28 NOTE — MAU Note (Addendum)
Noted bleeding at 1300 when used restroom, when she wiped.  Has been bloated and constipated.  Had been straining to have BM.  Denies any pain at this time, had really bad cramping yesterday- but felt it was more GI related.  No bleeding now  When used restroom.

## 2017-04-28 NOTE — MAU Provider Note (Addendum)
History     Chief Complaint  Patient presents with  . Vaginal Bleeding   36 yo G2P0010 SBF @ 13 5/7 weeks presents for evaluation of note brown vaginal blood with wipe today post void. Pt notes lower abdominal cramping for several weeks and is unchanged. No recent intercourse. (-) G/C . Pap at office with trich. Denies urinary sx OB History    Gravida Para Term Preterm AB Living   2       1 0   SAB TAB Ectopic Multiple Live Births   1       0      Past Medical History:  Diagnosis Date  . Abnormal Pap smear    CIN I  . Anemia   . Anxiety   . Depression    off meds now, ok now  . Enlarged thyroid gland    nromal function  . Fibroid   . Migraine   . Obesity   . Ovarian cyst   . Vaginal Pap smear, abnormal    ok now    Past Surgical History:  Procedure Laterality Date  . LAPAROSCOPIC GASTRIC SLEEVE RESECTION      Family History  Problem Relation Age of Onset  . Thyroid disease Mother        s/p thyroidectomy  . Leukemia Maternal Grandmother   . Stroke Maternal Grandmother   . Cancer Maternal Grandfather        lung  . Heart disease Paternal Grandmother   . COPD Paternal Grandfather     Social History  Substance Use Topics  . Smoking status: Former Smoker    Quit date: 06/07/2013  . Smokeless tobacco: Never Used  . Alcohol use No     Comment: social, prior to preg    Allergies:  Allergies  Allergen Reactions  . Novocain [Procaine] Anaphylaxis    Prescriptions Prior to Admission  Medication Sig Dispense Refill Last Dose  . acetaminophen (TYLENOL) 500 MG tablet Take 1,000 mg by mouth every 6 (six) hours as needed for headache.   Past Week at Unknown time  . diphenhydrAMINE (BENADRYL) 25 MG tablet Take 25 mg by mouth every 6 (six) hours as needed (For headache.). Takes with Tylenol.   Past Week at Unknown time  . doxylamine, Sleep, (UNISOM) 25 MG tablet Take 25 mg by mouth daily as needed (For nausea.). With B6.   Past Week at Unknown time  . flurbiprofen  (ANSAID) 100 MG tablet Take 100 mg by mouth 2 (two) times daily as needed.   Past Week at Unknown time  . Prenatal Vit-Fe Fumarate-FA (PRENATAL MULTIVITAMIN) TABS tablet Take 1 tablet by mouth daily at 12 noon.   04/28/2017 at Unknown time  . promethazine (PHENERGAN) 25 MG tablet Take 25 mg by mouth every 6 (six) hours as needed for nausea or vomiting. Reported on 04/06/2016   Past Week at Unknown time  . pyridoxine (B-6) 100 MG tablet Take 100 mg by mouth daily as needed (For nausea.). Takes with Unisom.   Past Week at Unknown time  . ranitidine (ZANTAC) 150 MG tablet Take 150 mg by mouth 2 (two) times daily as needed for heartburn.   04/28/2017 at Unknown time     Physical Exam   Blood pressure 121/84, pulse (!) 104, temperature 98.2 F (36.8 C), temperature source Oral, resp. rate 18.  General appearance: alert, cooperative and no distress Lungs: clear to auscultation bilaterally Breasts: soft tender no palp mass Heart: regular rate and rhythm, S1,  S2 normal, no murmur, click, rub or gallop Abdomen: gravid 2 FB above symphysis(+) FHR Pelvic: vulva nl Vaginal creamy yellow d/c with tinge of blood Cervix long/closed sl friable Uterus gravid 14 weeks And: nonpalp  Wet preP done:  (+) trich (+) wbc ED Course  IMP: trichomoniasis IUP @ 13 5/7 weeks P) flagyl 2 gram po x 1 now . Partner needs treatment( not together per pt). Keep Ob appt . D/c home MDM   Jessica Wild A, MD 5:07 PM 04/28/2017

## 2017-05-01 DIAGNOSIS — F411 Generalized anxiety disorder: Secondary | ICD-10-CM | POA: Diagnosis not present

## 2017-05-03 DIAGNOSIS — Z3A14 14 weeks gestation of pregnancy: Secondary | ICD-10-CM | POA: Diagnosis not present

## 2017-05-03 DIAGNOSIS — Z36 Encounter for antenatal screening for chromosomal anomalies: Secondary | ICD-10-CM | POA: Diagnosis not present

## 2017-05-03 DIAGNOSIS — O09522 Supervision of elderly multigravida, second trimester: Secondary | ICD-10-CM | POA: Diagnosis not present

## 2017-05-03 DIAGNOSIS — O09521 Supervision of elderly multigravida, first trimester: Secondary | ICD-10-CM | POA: Diagnosis not present

## 2017-05-15 DIAGNOSIS — F411 Generalized anxiety disorder: Secondary | ICD-10-CM | POA: Diagnosis not present

## 2017-05-18 DIAGNOSIS — Z361 Encounter for antenatal screening for raised alphafetoprotein level: Secondary | ICD-10-CM | POA: Diagnosis not present

## 2017-06-12 DIAGNOSIS — Z3A20 20 weeks gestation of pregnancy: Secondary | ICD-10-CM | POA: Diagnosis not present

## 2017-06-12 DIAGNOSIS — O09522 Supervision of elderly multigravida, second trimester: Secondary | ICD-10-CM | POA: Diagnosis not present

## 2017-06-26 DIAGNOSIS — F411 Generalized anxiety disorder: Secondary | ICD-10-CM | POA: Diagnosis not present

## 2017-07-02 DIAGNOSIS — Z3A22 22 weeks gestation of pregnancy: Secondary | ICD-10-CM | POA: Diagnosis not present

## 2017-07-02 DIAGNOSIS — O09522 Supervision of elderly multigravida, second trimester: Secondary | ICD-10-CM | POA: Diagnosis not present

## 2017-07-02 DIAGNOSIS — Z362 Encounter for other antenatal screening follow-up: Secondary | ICD-10-CM | POA: Diagnosis not present

## 2017-07-05 ENCOUNTER — Telehealth: Payer: Self-pay | Admitting: Cardiology

## 2017-07-05 NOTE — Telephone Encounter (Signed)
Received records from [facility] for appointment on 07/16/17 with Kerin Ransom, [place].  Records put with Luke's schedule for 07/16/17. lp

## 2017-07-16 ENCOUNTER — Ambulatory Visit (INDEPENDENT_AMBULATORY_CARE_PROVIDER_SITE_OTHER): Payer: BLUE CROSS/BLUE SHIELD | Admitting: Cardiology

## 2017-07-16 ENCOUNTER — Encounter: Payer: Self-pay | Admitting: Cardiology

## 2017-07-16 VITALS — BP 110/72 | HR 90 | Ht 60.0 in | Wt 228.2 lb

## 2017-07-16 DIAGNOSIS — Z3490 Encounter for supervision of normal pregnancy, unspecified, unspecified trimester: Secondary | ICD-10-CM | POA: Insufficient documentation

## 2017-07-16 DIAGNOSIS — R002 Palpitations: Secondary | ICD-10-CM

## 2017-07-16 DIAGNOSIS — E66813 Obesity, class 3: Secondary | ICD-10-CM

## 2017-07-16 DIAGNOSIS — Z3A25 25 weeks gestation of pregnancy: Secondary | ICD-10-CM | POA: Diagnosis not present

## 2017-07-16 DIAGNOSIS — R55 Syncope and collapse: Secondary | ICD-10-CM

## 2017-07-16 DIAGNOSIS — Z6841 Body Mass Index (BMI) 40.0 and over, adult: Secondary | ICD-10-CM

## 2017-07-16 NOTE — Assessment & Plan Note (Signed)
BMI 44- she is s/p bariatric surgery March 2017

## 2017-07-16 NOTE — Progress Notes (Signed)
07/16/2017 Jessica Banks   [birth year]  [id number]  Primary Physician Glendale Chard, MD Primary Cardiologist: Dr Sallyanne Kuster (new)  HPI:  Pleasant 36 y/o female, [redacted] weeks pregnant, referred to [place] for cardiac evaluation for near syncope. 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 She is [redacted] weeks pregnant with her first pregnancy. She describes episodes of weakness associated with diaphoresis, usually after she gets up in the morning, improved with food. At one point EMS was called and told her that her BS was 70. The denied any palpitations to me. She denies orthopnea, she does have LE edema after a day at work. She also told me she had a cardiac evaluation in 2016 prior to bariatric surgery in march 2017 at [facility] including a stress test and echo and was told these were normal.    Current Outpatient Prescriptions  Medication Sig Dispense Refill  . acetaminophen (TYLENOL) 500 MG tablet Take 1,000 mg by mouth every 6 (six) hours as needed for headache.    . diphenhydrAMINE (BENADRYL) 25 MG tablet Take 25 mg by mouth every 6 (six) hours as needed (For headache.). Takes with Tylenol.    . doxylamine, Sleep, (UNISOM) 25 MG tablet Take 25 mg by mouth daily as needed (For nausea.). With B6.    . pantoprazole (PROTONIX) 40 MG tablet Take 40 mg by mouth daily.  4  . Prenatal Vit-Fe Fumarate-FA (PRENATAL MULTIVITAMIN) TABS tablet Take 1 tablet by mouth daily at 12 noon.    . promethazine (PHENERGAN) 25 MG tablet Take 25 mg by mouth every 6 (six) hours as needed for nausea or vomiting. Reported on 04/06/2016     No current facility-administered medications for this visit.     Allergies  Allergen Reactions  . Novocain [Procaine] Anaphylaxis    Past Medical History:  Diagnosis Date  . Abnormal Pap smear    CIN I  . Anemia   . Anxiety   . Depression    off meds now, ok now  . Enlarged thyroid gland    nromal function  . Fibroid   . Migraine   .  Obesity   . Ovarian cyst   . Vaginal Pap smear, abnormal    ok now    Social History   Social History  . Marital status: Single    Spouse name: n/a  . Number of children: 0  . Years of education: master's   Occupational History  . RN     [employer]  . NP     graduated/boards spring 2014   Social History Main Topics  . Smoking status: Former Smoker    Quit date: 06/07/2013  . Smokeless tobacco: Never Used  . Alcohol use No     Comment: social, prior to preg  . Drug use: No  . Sexual activity: Not Currently   Other Topics Concern  . Not on file   Social History Narrative  . No narrative on file     Family History  Problem Relation Age of Onset  . Thyroid disease Mother        s/p thyroidectomy  . Leukemia Maternal Grandmother   . Stroke Maternal Grandmother   . Cancer Maternal Grandfather        lung  . Heart disease Paternal Grandmother   . COPD Paternal Grandfather      Review of Systems: General: negative for chills, fever, night sweats or weight changes.  Cardiovascular:  negative for chest pain, dyspnea on exertion, edema, orthopnea, palpitations, paroxysmal nocturnal dyspnea or shortness of breath Dermatological: negative for rash Respiratory: negative for cough or wheezing Urologic: negative for hematuria Abdominal: negative for nausea, vomiting, diarrhea, bright red blood per rectum, melena, or hematemesis Neurologic: negative for visual changes, syncope, or dizziness All other systems reviewed and are otherwise negative except as noted above.    Blood pressure 110/72, pulse 90, height 5' (1.524 m), weight 228 lb 3.2 oz (103.5 kg).  General appearance: alert, cooperative, no distress and mildly obese Neck: no carotid bruit and no JVD Lungs: clear to auscultation bilaterally Heart: regular rate and rhythm Abdomen: 25 weeks preganant, non tender Extremities: trace LE edema Pulses: 2+ and symmetric Skin: Skin color, texture, turgor normal.  No rashes or lesions Neurologic: Grossly normal  EKG NSR-HR 90, QTc 435  ASSESSMENT AND PLAN:   Near syncope Referred by Dr Lesle Chris at [facility]  Pregnant [redacted] wks pregnant with her first  Obesity BMI 44- she is s/p bariatric surgery March 2017   PLAN  Discussed with Dr Sallyanne Kuster who saw the patient with me today in the office. Plan an echo to r/o cardiomyopathy, if normal PRN follow up.   Kerin Ransom PA-C 07/16/2017 3:41 PM

## 2017-07-16 NOTE — Assessment & Plan Note (Signed)
Referred by Dr Lesle Chris at [facility]

## 2017-07-16 NOTE — Assessment & Plan Note (Signed)
[redacted] wks pregnant with her first

## 2017-07-16 NOTE — Patient Instructions (Signed)
Medication Instructions:  Your physician recommends that you continue on your current medications as directed. Please refer to the Current Medication list given to you today.  Testing/Procedures: Your physician has requested that you have an echocardiogram. Echocardiography is a painless test that uses sound waves to create images of your heart. It provides your doctor with information about the size and shape of your heart and how well your heart's chambers and valves are working. This procedure takes approximately one hour. There are no restrictions for this procedure. -this will be completed at our [address] location: [place]: Pending test results  Any Other Special Instructions Will Be Listed Below (If Applicable).     If you need a refill on your cardiac medications before your next appointment, please call your pharmacy.

## 2017-07-17 NOTE — Addendum Note (Signed)
Addended by: Vennie Homans on: 07/17/2017 03:32 PM   Modules accepted: Orders

## 2017-07-27 ENCOUNTER — Ambulatory Visit (HOSPITAL_COMMUNITY): Payer: BLUE CROSS/BLUE SHIELD | Attending: Internal Medicine

## 2017-07-27 ENCOUNTER — Other Ambulatory Visit: Payer: Self-pay

## 2017-07-27 DIAGNOSIS — I071 Rheumatic tricuspid insufficiency: Secondary | ICD-10-CM | POA: Diagnosis not present

## 2017-07-27 DIAGNOSIS — R002 Palpitations: Secondary | ICD-10-CM | POA: Insufficient documentation

## 2017-07-30 DIAGNOSIS — Z3A26 26 weeks gestation of pregnancy: Secondary | ICD-10-CM | POA: Diagnosis not present

## 2017-07-30 DIAGNOSIS — O09522 Supervision of elderly multigravida, second trimester: Secondary | ICD-10-CM | POA: Diagnosis not present

## 2017-08-07 DIAGNOSIS — Z3689 Encounter for other specified antenatal screening: Secondary | ICD-10-CM | POA: Diagnosis not present

## 2017-08-07 DIAGNOSIS — O09522 Supervision of elderly multigravida, second trimester: Secondary | ICD-10-CM | POA: Diagnosis not present

## 2017-08-07 DIAGNOSIS — Z3A28 28 weeks gestation of pregnancy: Secondary | ICD-10-CM | POA: Diagnosis not present

## 2017-08-23 DIAGNOSIS — Z3A30 30 weeks gestation of pregnancy: Secondary | ICD-10-CM | POA: Diagnosis not present

## 2017-08-23 DIAGNOSIS — R350 Frequency of micturition: Secondary | ICD-10-CM | POA: Diagnosis not present

## 2017-08-23 DIAGNOSIS — O09523 Supervision of elderly multigravida, third trimester: Secondary | ICD-10-CM | POA: Diagnosis not present

## 2017-09-04 DIAGNOSIS — F411 Generalized anxiety disorder: Secondary | ICD-10-CM | POA: Diagnosis not present

## 2017-09-06 DIAGNOSIS — Z3A32 32 weeks gestation of pregnancy: Secondary | ICD-10-CM | POA: Diagnosis not present

## 2017-09-06 DIAGNOSIS — O09523 Supervision of elderly multigravida, third trimester: Secondary | ICD-10-CM | POA: Diagnosis not present

## 2017-09-17 DIAGNOSIS — Z3A33 33 weeks gestation of pregnancy: Secondary | ICD-10-CM | POA: Diagnosis not present

## 2017-09-17 DIAGNOSIS — O09523 Supervision of elderly multigravida, third trimester: Secondary | ICD-10-CM | POA: Diagnosis not present

## 2017-09-17 DIAGNOSIS — Z23 Encounter for immunization: Secondary | ICD-10-CM | POA: Diagnosis not present

## 2017-10-03 DIAGNOSIS — O09523 Supervision of elderly multigravida, third trimester: Secondary | ICD-10-CM | POA: Diagnosis not present

## 2017-10-03 DIAGNOSIS — Z3A36 36 weeks gestation of pregnancy: Secondary | ICD-10-CM | POA: Diagnosis not present

## 2017-10-03 DIAGNOSIS — Z3685 Encounter for antenatal screening for Streptococcus B: Secondary | ICD-10-CM | POA: Diagnosis not present

## 2017-10-03 LAB — OB RESULTS CONSOLE GBS: GBS: NEGATIVE

## 2017-10-09 DIAGNOSIS — Z3A37 37 weeks gestation of pregnancy: Secondary | ICD-10-CM | POA: Diagnosis not present

## 2017-10-09 DIAGNOSIS — O09523 Supervision of elderly multigravida, third trimester: Secondary | ICD-10-CM | POA: Diagnosis not present

## 2017-10-16 DIAGNOSIS — F411 Generalized anxiety disorder: Secondary | ICD-10-CM | POA: Diagnosis not present

## 2017-10-17 ENCOUNTER — Inpatient Hospital Stay (HOSPITAL_COMMUNITY)
Admission: AD | Admit: 2017-10-17 | Discharge: 2017-10-19 | DRG: 807 | Disposition: A | Discharge status: Home / Self Care | Payer: BLUE CROSS/BLUE SHIELD | Source: Ambulatory Visit | Attending: Obstetrics and Gynecology | Admitting: Obstetrics and Gynecology

## 2017-10-17 ENCOUNTER — Encounter (HOSPITAL_COMMUNITY): Payer: Self-pay | Admitting: *Deleted

## 2017-10-17 ENCOUNTER — Inpatient Hospital Stay (HOSPITAL_COMMUNITY): Payer: BLUE CROSS/BLUE SHIELD | Admitting: Anesthesiology

## 2017-10-17 DIAGNOSIS — Z3A38 38 weeks gestation of pregnancy: Secondary | ICD-10-CM

## 2017-10-17 DIAGNOSIS — K219 Gastro-esophageal reflux disease without esophagitis: Secondary | ICD-10-CM | POA: Diagnosis present

## 2017-10-17 DIAGNOSIS — O99214 Obesity complicating childbirth: Secondary | ICD-10-CM | POA: Diagnosis present

## 2017-10-17 DIAGNOSIS — O4292 Full-term premature rupture of membranes, unspecified as to length of time between rupture and onset of labor: Secondary | ICD-10-CM | POA: Diagnosis not present

## 2017-10-17 DIAGNOSIS — Z87891 Personal history of nicotine dependence: Secondary | ICD-10-CM | POA: Diagnosis not present

## 2017-10-17 DIAGNOSIS — O9962 Diseases of the digestive system complicating childbirth: Secondary | ICD-10-CM | POA: Diagnosis not present

## 2017-10-17 DIAGNOSIS — Z3A00 Weeks of gestation of pregnancy not specified: Secondary | ICD-10-CM | POA: Diagnosis not present

## 2017-10-17 HISTORY — DX: Gastro-esophageal reflux disease without esophagitis: K21.9

## 2017-10-17 LAB — TYPE AND SCREEN
ABO/RH(D): A POS
Antibody Screen: NEGATIVE

## 2017-10-17 LAB — CBC
HCT: 35 % — ABNORMAL LOW (ref 36.0–46.0)
Hemoglobin: 11.1 g/dL — ABNORMAL LOW (ref 12.0–15.0)
MCH: 26.7 pg (ref 26.0–34.0)
MCHC: 31.7 g/dL (ref 30.0–36.0)
MCV: 84.1 fL (ref 78.0–100.0)
PLATELETS: 321 10*3/uL (ref 150–400)
RBC: 4.16 MIL/uL (ref 3.87–5.11)
RDW: 15 % (ref 11.5–15.5)
WBC: 10.2 10*3/uL (ref 4.0–10.5)

## 2017-10-17 LAB — URIC ACID: Uric Acid, Serum: 4.3 mg/dL (ref 2.3–6.6)

## 2017-10-17 LAB — ABO/RH: ABO/RH(D): A POS

## 2017-10-17 MED ORDER — LIDOCAINE HCL (PF) 1 % IJ SOLN
30.0000 mL | INTRAMUSCULAR | Status: DC | PRN
  Administered 2017-10-17: 30 mL via SUBCUTANEOUS
  Filled 2017-10-17: qty 30

## 2017-10-17 MED ORDER — EPHEDRINE 5 MG/ML INJ: 10.0000 mg | INTRAVENOUS | Status: DC | PRN

## 2017-10-17 MED ORDER — EPHEDRINE 5 MG/ML INJ
10.0000 mg | INTRAVENOUS | Status: DC | PRN
  Filled 2017-10-17: qty 2

## 2017-10-17 MED ORDER — IBUPROFEN 600 MG PO TABS
600.0000 mg | ORAL_TABLET | Freq: Four times a day (QID) | ORAL | Status: DC
  Administered 2017-10-18 – 2017-10-19 (×6): 600 mg via ORAL
  Filled 2017-10-17 (×6): qty 1

## 2017-10-17 MED ORDER — PHENYLEPHRINE 40 MCG/ML (10ML) SYRINGE FOR IV PUSH (FOR BLOOD PRESSURE SUPPORT): 80.0000 ug | PREFILLED_SYRINGE | INTRAVENOUS | Status: DC | PRN

## 2017-10-17 MED ORDER — DIBUCAINE 1 % RE OINT: 1.0000 "application " | TOPICAL_OINTMENT | RECTAL | Status: DC | PRN

## 2017-10-17 MED ORDER — FLEET ENEMA 7-19 GM/118ML RE ENEM: 1.0000 | ENEMA | RECTAL | Status: DC | PRN

## 2017-10-17 MED ORDER — LACTATED RINGERS IV SOLN
INTRAVENOUS | Status: DC
  Administered 2017-10-17 (×3): via INTRAVENOUS

## 2017-10-17 MED ORDER — COCONUT OIL OIL
1.0000 "application " | TOPICAL_OIL | Status: DC | PRN
  Administered 2017-10-18: 1 via TOPICAL
  Filled 2017-10-17: qty 120

## 2017-10-17 MED ORDER — BENZOCAINE-MENTHOL 20-0.5 % EX AERO
1.0000 "application " | INHALATION_SPRAY | Freq: Four times a day (QID) | CUTANEOUS | Status: DC | PRN
  Administered 2017-10-18: 1 via TOPICAL
  Filled 2017-10-17: qty 56

## 2017-10-17 MED ORDER — SENNOSIDES-DOCUSATE SODIUM 8.6-50 MG PO TABS
2.0000 | ORAL_TABLET | ORAL | Status: DC
  Administered 2017-10-18 (×2): 2 via ORAL
  Filled 2017-10-17 (×2): qty 2

## 2017-10-17 MED ORDER — SOD CITRATE-CITRIC ACID 500-334 MG/5ML PO SOLN: 30.0000 mL | ORAL | Status: DC | PRN

## 2017-10-17 MED ORDER — PHENYLEPHRINE 40 MCG/ML (10ML) SYRINGE FOR IV PUSH (FOR BLOOD PRESSURE SUPPORT)
80.0000 ug | PREFILLED_SYRINGE | INTRAVENOUS | Status: DC | PRN
  Administered 2017-10-17: 80 ug via INTRAVENOUS
  Filled 2017-10-17: qty 5

## 2017-10-17 MED ORDER — ACETAMINOPHEN 325 MG PO TABS: 650.0000 mg | ORAL_TABLET | ORAL | Status: DC | PRN

## 2017-10-17 MED ORDER — LACTATED RINGERS IV SOLN: 500.0000 mL | Freq: Once | INTRAVENOUS | Status: DC

## 2017-10-17 MED ORDER — DIPHENHYDRAMINE HCL 25 MG PO CAPS: 25.0000 mg | ORAL_CAPSULE | Freq: Four times a day (QID) | ORAL | Status: DC | PRN

## 2017-10-17 MED ORDER — DIPHENHYDRAMINE HCL 50 MG/ML IJ SOLN: 12.5000 mg | INTRAMUSCULAR | Status: DC | PRN

## 2017-10-17 MED ORDER — OXYTOCIN BOLUS FROM INFUSION
500.0000 mL | Freq: Once | INTRAVENOUS | Status: AC
  Administered 2017-10-17: 500 mL via INTRAVENOUS

## 2017-10-17 MED ORDER — FERROUS SULFATE 325 (65 FE) MG PO TABS
325.0000 mg | ORAL_TABLET | Freq: Two times a day (BID) | ORAL | Status: DC
  Administered 2017-10-18 (×2): 325 mg via ORAL
  Filled 2017-10-17 (×2): qty 1

## 2017-10-17 MED ORDER — OXYCODONE-ACETAMINOPHEN 5-325 MG PO TABS: 1.0000 | ORAL_TABLET | ORAL | Status: DC | PRN

## 2017-10-17 MED ORDER — LIDOCAINE HCL (PF) 1 % IJ SOLN
INTRAMUSCULAR | Status: DC | PRN
Start: 2017-10-17 — End: 2017-10-20
  Administered 2017-10-17: 5 mL via EPIDURAL
  Administered 2017-10-17: 6 mL via EPIDURAL

## 2017-10-17 MED ORDER — WITCH HAZEL-GLYCERIN EX PADS: 1.0000 "application " | MEDICATED_PAD | CUTANEOUS | Status: DC | PRN

## 2017-10-17 MED ORDER — PHENYLEPHRINE 40 MCG/ML (10ML) SYRINGE FOR IV PUSH (FOR BLOOD PRESSURE SUPPORT)
80.0000 ug | PREFILLED_SYRINGE | INTRAVENOUS | Status: DC | PRN
  Filled 2017-10-17: qty 10
  Filled 2017-10-17: qty 5

## 2017-10-17 MED ORDER — BUTORPHANOL TARTRATE 1 MG/ML IJ SOLN: 1.0000 mg | INTRAMUSCULAR | Status: DC | PRN

## 2017-10-17 MED ORDER — ACETAMINOPHEN 325 MG PO TABS
650.0000 mg | ORAL_TABLET | ORAL | Status: DC | PRN
Start: 2017-10-17 — End: 2017-10-19

## 2017-10-17 MED ORDER — LACTATED RINGERS IV SOLN
500.0000 mL | Freq: Once | INTRAVENOUS | Status: AC
Start: 2017-10-17 — End: 2017-10-17
  Administered 2017-10-17: 500 mL via INTRAVENOUS

## 2017-10-17 MED ORDER — OXYTOCIN 40 UNITS IN LACTATED RINGERS INFUSION - SIMPLE MED
2.5000 [IU]/h | INTRAVENOUS | Status: DC
  Administered 2017-10-17: 2.5 [IU]/h via INTRAVENOUS
  Filled 2017-10-17: qty 1000

## 2017-10-17 MED ORDER — TERBUTALINE SULFATE 1 MG/ML IJ SOLN
0.2500 mg | Freq: Once | INTRAMUSCULAR | Status: DC | PRN
  Filled 2017-10-17: qty 1

## 2017-10-17 MED ORDER — FENTANYL 2.5 MCG/ML BUPIVACAINE 1/10 % EPIDURAL INFUSION (WH - ANES)
14.0000 mL/h | INTRAMUSCULAR | Status: DC | PRN
  Administered 2017-10-17: 12 mL/h via EPIDURAL
  Administered 2017-10-17: 14 mL/h via EPIDURAL
  Filled 2017-10-17 (×2): qty 100

## 2017-10-17 MED ORDER — ONDANSETRON HCL 4 MG/2ML IJ SOLN: 4.0000 mg | INTRAMUSCULAR | Status: DC | PRN

## 2017-10-17 MED ORDER — LACTATED RINGERS IV SOLN
500.0000 mL | INTRAVENOUS | Status: DC | PRN
  Administered 2017-10-17: 500 mL via INTRAVENOUS
  Administered 2017-10-17: 1000 mL via INTRAVENOUS

## 2017-10-17 MED ORDER — DIPHENHYDRAMINE HCL 50 MG/ML IJ SOLN
12.5000 mg | INTRAMUSCULAR | Status: DC | PRN
Start: 2017-10-17 — End: 2017-10-17

## 2017-10-17 MED ORDER — ZOLPIDEM TARTRATE 5 MG PO TABS: 5.0000 mg | ORAL_TABLET | Freq: Every evening | ORAL | Status: DC | PRN

## 2017-10-17 MED ORDER — SODIUM BICARBONATE 8.4 % IV SOLN
INTRAVENOUS | Status: DC | PRN
  Administered 2017-10-17: 3 mL via EPIDURAL

## 2017-10-17 MED ORDER — ONDANSETRON HCL 4 MG PO TABS
4.0000 mg | ORAL_TABLET | ORAL | Status: DC | PRN
Start: 2017-10-17 — End: 2017-10-19

## 2017-10-17 MED ORDER — OXYTOCIN 40 UNITS IN LACTATED RINGERS INFUSION - SIMPLE MED
1.0000 m[IU]/min | INTRAVENOUS | Status: DC
  Administered 2017-10-17: 2 m[IU]/min via INTRAVENOUS

## 2017-10-17 MED ORDER — PRENATAL MULTIVITAMIN CH
1.0000 | ORAL_TABLET | Freq: Every day | ORAL | Status: DC
  Administered 2017-10-18: 1 via ORAL
  Filled 2017-10-17: qty 1

## 2017-10-17 MED ORDER — OXYCODONE-ACETAMINOPHEN 5-325 MG PO TABS: 2.0000 | ORAL_TABLET | ORAL | Status: DC | PRN

## 2017-10-17 MED ORDER — ONDANSETRON HCL 4 MG/2ML IJ SOLN
4.0000 mg | Freq: Four times a day (QID) | INTRAMUSCULAR | Status: DC | PRN
  Administered 2017-10-17: 4 mg via INTRAVENOUS
  Filled 2017-10-17: qty 2

## 2017-10-17 MED ORDER — SIMETHICONE 80 MG PO CHEW: 80.0000 mg | CHEWABLE_TABLET | ORAL | Status: DC | PRN

## 2017-10-17 NOTE — H&P (Signed)
Jessica Banks is a 36 y.o. female presenting @ term  In early labor having SROM clear fluid 7;15 am. GBS cx neg (+) ctx  OB History    Gravida Para Term Preterm AB Living   2       1 0   SAB TAB Ectopic Multiple Live Births   1       0     Past Medical History:  Diagnosis Date  . Abnormal Pap smear    CIN I  . Anemia   . Anxiety   . Depression    off meds now, ok now  . Enlarged thyroid gland    nromal function  . Fibroid   . GERD (gastroesophageal reflux disease)   . Migraine   . Obesity   . Ovarian cyst   . Vaginal Pap smear, abnormal    ok now   Past Surgical History:  Procedure Laterality Date  . LAPAROSCOPIC GASTRIC SLEEVE RESECTION     Family History: family history includes Arthritis in her mother; COPD in her paternal grandfather; Cancer in her maternal grandfather; Heart disease in her paternal grandmother; Leukemia in her maternal grandmother; Stroke in her maternal grandmother; Thyroid disease in her mother. Social History:  reports that she quit smoking about 4 years ago. She has never used smokeless tobacco. She reports that she does not drink alcohol or use drugs.     Maternal Diabetes: No Genetic Screening: Normal Maternal Ultrasounds/Referrals: Abnormal:  Findings:   Isolated EIF (echogenic intracardiac focus) Fetal Ultrasounds or other Referrals:  None Maternal Substance Abuse:  No Significant Maternal Medications:  None Significant Maternal Lab Results:  Lab values include: Group B Strep negative Other Comments:  None  Review of Systems  Constitutional: Negative.   All other systems reviewed and are negative.  History Dilation: 5 Effacement (%): 80 Station: -1 Exam by:: A. Gray RN  Blood pressure (!) 101/55, pulse 78, temperature 97.7 F (36.5 C), temperature source Oral, resp. rate 18, height 5' (1.524 m), weight 108 kg (238 lb), SpO2 100 %. Exam Physical Exam  Constitutional: She appears well-developed and well-nourished.  HENT:   Head: Atraumatic.  Eyes: EOM are normal.  Neck: Neck supple.  Cardiovascular: Regular rhythm.   Respiratory: Breath sounds normal.  Musculoskeletal: Normal range of motion. She exhibits edema.  Neurological: She is alert.  Skin: Skin is warm and dry.    Prenatal labs: ABO, Rh: --/--/A POS, A POS (10/31 0254) Antibody: NEG (10/31 2706) Rubella: Immune (04/19 0000) RPR: Nonreactive (04/19 0000)  HBsAg: Negative (04/19 0000)  HIV: Non-reactive (04/19 0000)  GBS: Negative (10/17 0000)   Assessment/Plan: Early labor PROM Term gestation P) admit routine labs. Pitocin augmentation. Analgesic/epidural   Rayder Sullenger A 10/17/2017, 1:19 PM

## 2017-10-17 NOTE — Progress Notes (Signed)
S; notes some rectal pressure Epidural just redosed  O: VE rim/100/-1 asynclitic  Tracing: baseline 120 (+) accel occ variables Ctx q 2-3 mins  IMP: Active labor P) right exaggerated sims.cont pitocin

## 2017-10-17 NOTE — MAU Note (Addendum)
Started leaking at 0716, clear fluid, small amt of blood streaked mucous. Some contractions.

## 2017-10-17 NOTE — Anesthesia Pain Management Evaluation Note (Signed)
  CRNA Pain Management Visit Note  Patient: Jessica Banks, 36 y.o., female  "Hello I am a member of the anesthesia team at [facility]. We have an anesthesia team available at all times to provide care throughout the hospital, including epidural management and anesthesia for C-section. I don't know your plan for the delivery whether it a natural birth, water birth, IV sedation, nitrous supplementation, doula or epidural, but we want to meet your pain goals."   1.Was your pain managed to your expectations on prior hospitalizations?   Yes   2.What is your expectation for pain management during this hospitalization?     Epidural  3.How can we help you reach that goal?   Record the patient's initial score and the patient's pain goal.   Pain: 4  Pain Goal: 9 [facility] wants you to be able to say your pain was always managed very well.  Shanekqua Schaper Hristova 10/17/2017

## 2017-10-17 NOTE — Progress Notes (Addendum)
Contacted on-call Dr Benjie Karvonen, okay to give dermoplast spray per verbal read-back order.  Lab also wished to ask about uric acid level order, Dr Benjie Karvonen said to draw lab as ordered.

## 2017-10-17 NOTE — Anesthesia Preprocedure Evaluation (Signed)
Anesthesia Evaluation  Patient identified by MRN, date of birth, ID band Patient awake    Reviewed: Allergy & Precautions, NPO status , Patient's Chart, lab work & pertinent test results  History of Anesthesia Complications Negative for: history of anesthetic complications  Airway Mallampati: II  TM Distance: >3 FB Neck ROM: Full    Dental  (+) Dental Advisory Given   Pulmonary former smoker,    breath sounds clear to auscultation       Cardiovascular negative cardio ROS   Rhythm:Regular Rate:Normal     Neuro/Psych  Headaches,    GI/Hepatic Neg liver ROS, GERD  Medicated and Controlled,  Endo/Other  Morbid obesity  Renal/GU negative Renal ROS     Musculoskeletal   Abdominal (+) + obese,   Peds  Hematology plt 321k   Anesthesia Other Findings   Reproductive/Obstetrics (+) Pregnancy                            Anesthesia Physical Anesthesia Plan  ASA: II  Anesthesia Plan: Epidural   Post-op Pain Management:    Induction:   PONV Risk Score and Plan: Treatment may vary due to age or medical condition  Airway Management Planned: Natural Airway  Additional Equipment:   Intra-op Plan:   Post-operative Plan:   Informed Consent: I have reviewed the patients History and Physical, chart, labs and discussed the procedure including the risks, benefits and alternatives for the proposed anesthesia with the patient or authorized representative who has indicated his/her understanding and acceptance.   Dental advisory given  Plan Discussed with:   Anesthesia Plan Comments: (Patient identified. Risks/Benefits/Options discussed with patient including but not limited to bleeding, infection, nerve damage, paralysis, failed block, incomplete pain control, headache, blood pressure changes, nausea, vomiting, reactions to medication both or allergic, itching and postpartum back pain. Confirmed with  bedside nurse the patient's most recent platelet count. Confirmed with patient that they are not currently taking any anticoagulation, have any bleeding history or any family history of bleeding disorders. Patient expressed understanding and wished to proceed. All questions were answered. )        Anesthesia Quick Evaluation

## 2017-10-17 NOTE — Progress Notes (Signed)
S: epidural  O; VE 4/70/-2 IUPC placed  tracing: baseline 120 (+) accels Couplets  IMP: arrest of dilation Term gestation P) start pitocin.

## 2017-10-17 NOTE — Anesthesia Procedure Notes (Signed)
Epidural Patient location during procedure: OB Start time: 10/17/2017 10:04 AM End time: 10/17/2017 10:44 AM  Staffing Anesthesiologist: Annye Asa Performed: anesthesiologist   Preanesthetic Checklist Completed: patient identified, surgical consent, pre-op evaluation, timeout performed, IV checked, risks and benefits discussed and monitors and equipment checked  Epidural Patient position: sitting Prep: site prepped and draped and DuraPrep Patient monitoring: blood pressure, continuous pulse ox and heart rate Approach: midline Location: L3-L4 Injection technique: LOR air  Needle:  Needle type: Tuohy  Needle gauge: 17 G Needle length: 9 cm Needle insertion depth: 7 cm Catheter type: closed end flexible Catheter size: 19 Gauge Catheter at skin depth: 12 cm Test dose: negative (1% lidocaine)  Assessment Events: blood not aspirated, injection not painful, no injection resistance, negative IV test and no paresthesia  Additional Notes Pt identified in Labor room.  Monitors applied. Working IV access confirmed. Sterile prep, drape lumbar spine.  1% lido local L 2,3.  #17ga Touhy os only, repeat local L 3,4 and LOR air at 7 cm L 3,4, cath in easily to 12 cm skin. Test dose OK, cath dosed and infusion begun.  Patient asymptomatic, VSS, no heme aspirated, tolerated well.  Jenita Seashore, MDReason for block:procedure for pain

## 2017-10-18 LAB — CBC
HCT: 28.1 % — ABNORMAL LOW (ref 36.0–46.0)
Hemoglobin: 9.2 g/dL — ABNORMAL LOW (ref 12.0–15.0)
MCH: 27.1 pg (ref 26.0–34.0)
MCHC: 32.7 g/dL (ref 30.0–36.0)
MCV: 82.9 fL (ref 78.0–100.0)
PLATELETS: 261 10*3/uL (ref 150–400)
RBC: 3.39 MIL/uL — AB (ref 3.87–5.11)
RDW: 15.2 % (ref 11.5–15.5)
WBC: 19.3 10*3/uL — AB (ref 4.0–10.5)

## 2017-10-18 LAB — RPR: RPR Ser Ql: NONREACTIVE

## 2017-10-18 NOTE — Progress Notes (Signed)
Post Partum Day 1,  G2P1011. SVD, 2nd degree lac. 10/31  7 pm. BOY. Subjective: no complaints, up ad lib, voiding and tolerating PO Baby breast feeding well, voided.   Objective: Blood pressure 112/70, pulse 77, temperature 98.2 F (36.8 C), temperature source Axillary, resp. rate 18, height 5' (1.524 m), weight 238 lb (108 kg), SpO2 100 %, unknown if currently breastfeeding.  Physical Exam:  General: alert and cooperative Lochia: appropriate Uterine Fundus: firm Incision: healing well DVT Evaluation: No evidence of DVT seen on physical exam.  CBC Latest Ref Rng & Units 10/18/2017 10/17/2017   WBC 4.0 - 10.5 K/uL 19.3(H) 10.2   Hemoglobin 12.0 - 15.0 g/dL 9.2(L) 11.1(L)   Hematocrit 36.0 - 46.0 % 28.1(L) 35.0(L)   Platelets 150 - 400 K/uL 261 321    A(+) Rub Imm  Assessment/Plan: Plan for discharge tomorrow, Breastfeeding and Circumcision prior to discharge, Dr cousins aware.  WBC elevated, exam non-focal and asymptomatic, no further f/up needed, patient and Dr Garwin Brothers aware  No hx Depression    LOS: 1 day   Jessica Banks R 10/18/2017, 7:41 AM

## 2017-10-18 NOTE — Lactation Note (Signed)
This note was copied from a baby's chart. Lactation Consultation Note New mom w/10 hr old baby. Mom stated baby BF great after delivery, but hasn't wanted to BF since. Baby is spitty. Abd. Slightly distended. Newborn behavior, feeding habits, STS, I&O, cluster feeding, supply and demand educated. Mom encouraged to feed baby 8-12 times/24 hours and with feeding cues.  Mom has pendulous breast w/everted nipple. Hand expression taught w/dot of colostrum to Lt. Nipple. Rt. Breast more tender, heavier, no colostrum noted. Rt. Nipple semi flat, everts well w/stimulation.  Answered questions mom had. Assisted in latch n football hold. Baby latched well. Taught "C" hold and using props and support.  [place] brochure given w/resources, support groups and [place] services.  Patient Name: Jessica Banks Date: 10/18/2017 Reason for consult: Initial assessment   Maternal Data Has patient been taught Hand Expression?: Yes Does the patient have breastfeeding experience prior to this delivery?: No  Feeding Feeding Type: Breast Fed Length of feed: 15 min (still BF)  LATCH Score Latch: Grasps breast easily, tongue down, lips flanged, rhythmical sucking.  Audible Swallowing: A few with stimulation  Type of Nipple: Everted at rest and after stimulation  Comfort (Breast/Nipple): Soft / non-tender  Hold (Positioning): Assistance needed to correctly position infant at breast and maintain latch.  LATCH Score: 8  Interventions Interventions: Breast feeding basics reviewed;Breast compression;Assisted with latch;Adjust position;Skin to skin;Support pillows;Breast massage;Position options;Hand express  Lactation Tools Discussed/Used     Consult Status Consult Status: Follow-up Date: 10/19/17 Follow-up type: In-patient    Theodoro Kalata 10/18/2017, 5:29 AM

## 2017-10-18 NOTE — Progress Notes (Signed)
MOB was referred for history of depression/anxiety. * Referral screened out by Clinical Social Worker because none of the following criteria appear to apply: ~ History of anxiety/depression during this pregnancy, or of post-partum depression. ~ Diagnosis of anxiety and/or depression within last 3 years OR * MOB's symptoms currently being treated with medication and/or therapy. Please contact the Clinical Social Worker if needs arise, by [facility] request, or if MOB scores greater than 9/yes to question 10 on Edinburgh Postpartum Depression Screen.  Chart notes MOB is "okay now."

## 2017-10-18 NOTE — Anesthesia Postprocedure Evaluation (Signed)
Anesthesia Post Note  Patient: Jessica Banks  Procedure(s) Performed: AN AD Electra     Patient location during evaluation: Mother Baby Anesthesia Type: Epidural Level of consciousness: awake Vital Signs Assessment: post-procedure vital signs reviewed and stable Respiratory status: spontaneous breathing Cardiovascular status: blood pressure returned to baseline Postop Assessment: epidural receding, patient able to bend at knees and no headache Anesthetic complications: no    Last Vitals:  Vitals:   10/18/17 0230 10/18/17 0625  BP: (!) 105/54 112/70  Pulse: 93 77  Resp: 18 18  Temp: 36.8 C 36.8 C  SpO2:      Last Pain:  Vitals:   10/18/17 0625  TempSrc: Axillary  PainSc: 2    Pain Goal:                 Everette Rank

## 2017-10-19 MED ORDER — IBUPROFEN 600 MG PO TABS: 600.0000 mg | ORAL_TABLET | Freq: Four times a day (QID) | ORAL | 0 refills | Status: DC

## 2017-10-19 NOTE — Lactation Note (Signed)
This note was copied from a baby's chart. Lactation Consultation Note  Patient Name: Jessica Banks [facility] Date: 10/19/2017  Mom and baby for discharge today.  Reviewed discharge teaching including engorgement treatment.  Answered questions.  Mom feeling more confident with breastfeeding.  Outpatient lactation services reviewed and encouraged.  Mom states she has a lot of good support at home.   Maternal Data    Feeding Feeding Type: Breast Milk  LATCH Score                   Interventions    Lactation Tools Discussed/Used     Consult Status      Ave Filter 10/19/2017, 9:04 AM

## 2017-10-19 NOTE — Discharge Summary (Signed)
OB Discharge Summary  Patient Name: Jessica Banks DOB: 08/20/1981 MRN: [id number]  Date of admission: 10/17/2017 Delivering MD: COUSINS, SHERONETTE   Date of discharge: 10/19/2017  Admitting diagnosis: WATER BROKE,CTX Intrauterine pregnancy: [redacted]w[redacted]d     Secondary diagnosis:Principal Problem:   Postpartum care following vaginal delivery (10/31) Active Problems:   Normal labor and delivery   SVD (spontaneous vaginal delivery)   Second-degree perineal laceration, with delivery   Obstetrical laceration: labial minora lacerations R>L  Additional problems:none     Discharge diagnosis: Term Pregnancy Delivered                                                                     Post partum procedures:none  Augmentation: Pitocin  Complications: None  Hospital course:  Onset of Labor With Vaginal Delivery     36 y.o. yo G2P1011 at [redacted]w[redacted]d was admitted in Latent Labor on 10/17/2017. Patient had an uncomplicated labor course as follows:  Membrane Rupture Time/Date: 7:16 AM ,10/17/2017   Intrapartum Procedures: Episiotomy: None [1]                                         Lacerations:  2nd degree [3];Perineal [11]  Patient had a delivery of a Viable infant. 10/17/2017  Information for the patient's newborn:  Ruhani, [place] [[id number]]       Pateint had an uncomplicated postpartum course.  She is ambulating, tolerating a regular diet, passing flatus, and urinating well. Patient is discharged home in stable condition on 10/19/17.  On PPD#2 pt notes no CP/ SOB. No dizziness with standing, minimal lochia. Pt notes painful BM. Tolerating pain with NSAIDs, no narcotic  use  Physical exam  Vitals:   10/18/17 0230 10/18/17 0625 10/18/17 1747 10/19/17 0640  BP: (!) 105/54 112/70 121/80 135/75  Pulse: 93 77 86 86  Resp: 18 18 16 18   Temp: 98.3 F (36.8 C) 98.2 F (36.8 C) 98.7 F (37.1 C) 98.2 F (36.8 C)  TempSrc: Axillary Axillary Oral Axillary  SpO2:   99%   Weight:       Height:       General: alert and cooperative Lochia: appropriate Uterine Fundus: firm Incision: N/A DVT Evaluation: No evidence of DVT seen on physical exam. Labs: Lab Results  Component Value Date   WBC 19.3 (H) 10/18/2017   HGB 9.2 (L) 10/18/2017   HCT 28.1 (L) 10/18/2017   MCV 82.9 10/18/2017   PLT 261 10/18/2017   CMP Latest Ref Rng & Units 04/10/2016  Glucose 65 - 99 mg/dL 88  BUN 6 - 20 mg/dL 13  Creatinine 0.44 - 1.00 mg/dL 0.70  Sodium 135 - 145 mmol/L 136  Potassium 3.5 - 5.1 mmol/L 3.7  Chloride 101 - 111 mmol/L 107  CO2 22 - 32 mmol/L 18(L)  Calcium 8.9 - 10.3 mg/dL 9.4  Total Protein 6.5 - 8.1 g/dL 8.0  Total Bilirubin 0.3 - 1.2 mg/dL 0.5  Alkaline Phos 38 - 126 U/L 99  AST 15 - 41 U/L 25  ALT 14 - 54 U/L 14    Discharge instruction: per After Visit Summary and "Baby and  Me Booklet".  After Visit Meds:  Allergies as of 10/19/2017      Reactions   Novocain [procaine] Anaphylaxis      Medication List    TAKE these medications   acetaminophen 500 MG tablet Commonly known as:  TYLENOL Take 1,000 mg by mouth every 6 (six) hours as needed for headache.   doxylamine (Sleep) 25 MG tablet Commonly known as:  UNISOM Take 25 mg by mouth at bedtime as needed for sleep. With B6.   ibuprofen 600 MG tablet Commonly known as:  ADVIL,MOTRIN Take 1 tablet (600 mg total) by mouth every 6 (six) hours.   pantoprazole 40 MG tablet Commonly known as:  PROTONIX Take 40 mg by mouth daily.   prenatal multivitamin Tabs tablet Take 1 tablet by mouth daily at 12 noon.   ranitidine 150 MG tablet Commonly known as:  ZANTAC Take 150 mg by mouth daily as needed for heartburn.       Diet: routine diet  Given asymptomatic anemia, pt would like to treat with dietary supplementation over iron pills.  Instructed on stool softeners as needed  Activity: Advance as tolerated. Pelvic rest for 6 weeks.   Outpatient follow up:6 weeks Follow up Appt:No future  appointments. Follow up visit: No Follow-up on file.  Postpartum contraception: Undecided and cnosidering IUD  Newborn Data: Live born female  Birth Weight: 6 lb 15.1 oz (3150 g) APGAR: 7, 9  Newborn Delivery   Birth date/time:  10/17/2017 19:12:00 Delivery type:  Vaginal, Spontaneous Delivery      Baby Feeding: Breast Disposition:home with mother   10/19/2017 Ala Dach., MD

## 2017-12-03 DIAGNOSIS — M25531 Pain in right wrist: Secondary | ICD-10-CM | POA: Diagnosis not present

## 2017-12-03 DIAGNOSIS — M654 Radial styloid tenosynovitis [de Quervain]: Secondary | ICD-10-CM | POA: Diagnosis not present

## 2017-12-04 DIAGNOSIS — Z130 Encounter for screening for diseases of the blood and blood-forming organs and certain disorders involving the immune mechanism: Secondary | ICD-10-CM | POA: Diagnosis not present

## 2017-12-25 DIAGNOSIS — F411 Generalized anxiety disorder: Secondary | ICD-10-CM | POA: Diagnosis not present

## 2018-01-01 DIAGNOSIS — M25531 Pain in right wrist: Secondary | ICD-10-CM | POA: Diagnosis not present

## 2018-01-01 DIAGNOSIS — M654 Radial styloid tenosynovitis [de Quervain]: Secondary | ICD-10-CM | POA: Diagnosis not present

## 2018-06-25 DIAGNOSIS — F411 Generalized anxiety disorder: Secondary | ICD-10-CM | POA: Diagnosis not present

## 2018-07-31 DIAGNOSIS — F411 Generalized anxiety disorder: Secondary | ICD-10-CM | POA: Diagnosis not present

## 2018-12-17 DIAGNOSIS — Z049 Encounter for examination and observation for unspecified reason: Secondary | ICD-10-CM | POA: Diagnosis not present

## 2018-12-17 DIAGNOSIS — R51 Headache: Secondary | ICD-10-CM | POA: Diagnosis not present

## 2018-12-17 DIAGNOSIS — Z79899 Other long term (current) drug therapy: Secondary | ICD-10-CM | POA: Diagnosis not present

## 2018-12-17 DIAGNOSIS — G43719 Chronic migraine without aura, intractable, without status migrainosus: Secondary | ICD-10-CM | POA: Diagnosis not present

## 2018-12-26 DIAGNOSIS — M791 Myalgia, unspecified site: Secondary | ICD-10-CM | POA: Diagnosis not present

## 2018-12-26 DIAGNOSIS — M542 Cervicalgia: Secondary | ICD-10-CM | POA: Diagnosis not present

## 2018-12-26 DIAGNOSIS — G518 Other disorders of facial nerve: Secondary | ICD-10-CM | POA: Diagnosis not present

## 2018-12-26 DIAGNOSIS — G43719 Chronic migraine without aura, intractable, without status migrainosus: Secondary | ICD-10-CM | POA: Diagnosis not present

## 2019-01-17 DIAGNOSIS — G43719 Chronic migraine without aura, intractable, without status migrainosus: Secondary | ICD-10-CM | POA: Diagnosis not present

## 2019-01-17 DIAGNOSIS — G518 Other disorders of facial nerve: Secondary | ICD-10-CM | POA: Diagnosis not present

## 2019-01-17 DIAGNOSIS — M791 Myalgia, unspecified site: Secondary | ICD-10-CM | POA: Diagnosis not present

## 2019-01-17 DIAGNOSIS — M542 Cervicalgia: Secondary | ICD-10-CM | POA: Diagnosis not present

## 2019-01-24 DIAGNOSIS — Z113 Encounter for screening for infections with a predominantly sexual mode of transmission: Secondary | ICD-10-CM | POA: Diagnosis not present

## 2019-01-24 DIAGNOSIS — Z1151 Encounter for screening for human papillomavirus (HPV): Secondary | ICD-10-CM | POA: Diagnosis not present

## 2019-01-24 DIAGNOSIS — Z6841 Body Mass Index (BMI) 40.0 and over, adult: Secondary | ICD-10-CM | POA: Diagnosis not present

## 2019-01-24 DIAGNOSIS — Z01419 Encounter for gynecological examination (general) (routine) without abnormal findings: Secondary | ICD-10-CM | POA: Diagnosis not present

## 2019-01-24 DIAGNOSIS — Z124 Encounter for screening for malignant neoplasm of cervix: Secondary | ICD-10-CM | POA: Diagnosis not present

## 2019-01-31 DIAGNOSIS — G518 Other disorders of facial nerve: Secondary | ICD-10-CM | POA: Diagnosis not present

## 2019-01-31 DIAGNOSIS — M542 Cervicalgia: Secondary | ICD-10-CM | POA: Diagnosis not present

## 2019-01-31 DIAGNOSIS — M791 Myalgia, unspecified site: Secondary | ICD-10-CM | POA: Diagnosis not present

## 2019-01-31 DIAGNOSIS — R51 Headache: Secondary | ICD-10-CM | POA: Diagnosis not present

## 2019-01-31 DIAGNOSIS — G43719 Chronic migraine without aura, intractable, without status migrainosus: Secondary | ICD-10-CM | POA: Diagnosis not present

## 2019-02-24 DIAGNOSIS — G518 Other disorders of facial nerve: Secondary | ICD-10-CM | POA: Diagnosis not present

## 2019-02-24 DIAGNOSIS — M791 Myalgia, unspecified site: Secondary | ICD-10-CM | POA: Diagnosis not present

## 2019-02-24 DIAGNOSIS — M542 Cervicalgia: Secondary | ICD-10-CM | POA: Diagnosis not present

## 2019-02-24 DIAGNOSIS — G43719 Chronic migraine without aura, intractable, without status migrainosus: Secondary | ICD-10-CM | POA: Diagnosis not present

## 2019-03-11 DIAGNOSIS — M791 Myalgia, unspecified site: Secondary | ICD-10-CM | POA: Diagnosis not present

## 2019-03-11 DIAGNOSIS — G43719 Chronic migraine without aura, intractable, without status migrainosus: Secondary | ICD-10-CM | POA: Diagnosis not present

## 2019-03-11 DIAGNOSIS — F411 Generalized anxiety disorder: Secondary | ICD-10-CM | POA: Diagnosis not present

## 2019-03-11 DIAGNOSIS — M542 Cervicalgia: Secondary | ICD-10-CM | POA: Diagnosis not present

## 2019-03-11 DIAGNOSIS — G518 Other disorders of facial nerve: Secondary | ICD-10-CM | POA: Diagnosis not present

## 2019-03-25 DIAGNOSIS — M791 Myalgia, unspecified site: Secondary | ICD-10-CM | POA: Diagnosis not present

## 2019-03-25 DIAGNOSIS — M542 Cervicalgia: Secondary | ICD-10-CM | POA: Diagnosis not present

## 2019-03-25 DIAGNOSIS — G518 Other disorders of facial nerve: Secondary | ICD-10-CM | POA: Diagnosis not present

## 2019-03-25 DIAGNOSIS — G43719 Chronic migraine without aura, intractable, without status migrainosus: Secondary | ICD-10-CM | POA: Diagnosis not present

## 2019-04-23 ENCOUNTER — Emergency Department (HOSPITAL_COMMUNITY): Payer: BLUE CROSS/BLUE SHIELD

## 2019-04-23 ENCOUNTER — Encounter (HOSPITAL_COMMUNITY): Payer: Self-pay | Admitting: Radiology

## 2019-04-23 ENCOUNTER — Emergency Department (HOSPITAL_COMMUNITY)
Admission: EM | Admit: 2019-04-23 | Discharge: 2019-04-23 | Disposition: A | Discharge status: Home / Self Care | Payer: BLUE CROSS/BLUE SHIELD | Attending: Emergency Medicine | Admitting: Emergency Medicine

## 2019-04-23 ENCOUNTER — Other Ambulatory Visit: Payer: Self-pay

## 2019-04-23 DIAGNOSIS — R079 Chest pain, unspecified: Secondary | ICD-10-CM | POA: Diagnosis not present

## 2019-04-23 DIAGNOSIS — R7989 Other specified abnormal findings of blood chemistry: Secondary | ICD-10-CM | POA: Diagnosis not present

## 2019-04-23 DIAGNOSIS — R0789 Other chest pain: Secondary | ICD-10-CM | POA: Insufficient documentation

## 2019-04-23 DIAGNOSIS — Z87891 Personal history of nicotine dependence: Secondary | ICD-10-CM | POA: Diagnosis not present

## 2019-04-23 DIAGNOSIS — R000 Tachycardia, unspecified: Secondary | ICD-10-CM | POA: Diagnosis not present

## 2019-04-23 DIAGNOSIS — Z79899 Other long term (current) drug therapy: Secondary | ICD-10-CM | POA: Diagnosis not present

## 2019-04-23 DIAGNOSIS — R069 Unspecified abnormalities of breathing: Secondary | ICD-10-CM | POA: Diagnosis not present

## 2019-04-23 DIAGNOSIS — R0902 Hypoxemia: Secondary | ICD-10-CM | POA: Diagnosis not present

## 2019-04-23 DIAGNOSIS — R0602 Shortness of breath: Secondary | ICD-10-CM | POA: Diagnosis not present

## 2019-04-23 LAB — TSH: TSH: 2.117 u[IU]/mL (ref 0.350–4.500)

## 2019-04-23 LAB — CBC WITH DIFFERENTIAL/PLATELET
Abs Immature Granulocytes: 0.03 10*3/uL (ref 0.00–0.07)
Basophils Absolute: 0 10*3/uL (ref 0.0–0.1)
Basophils Relative: 0 %
Eosinophils Absolute: 0.1 10*3/uL (ref 0.0–0.5)
Eosinophils Relative: 1 %
HCT: 39 % (ref 36.0–46.0)
Hemoglobin: 12 g/dL (ref 12.0–15.0)
Immature Granulocytes: 0 %
Lymphocytes Relative: 24 %
Lymphs Abs: 2.2 10*3/uL (ref 0.7–4.0)
MCH: 27.5 pg (ref 26.0–34.0)
MCHC: 30.8 g/dL (ref 30.0–36.0)
MCV: 89.4 fL (ref 80.0–100.0)
Monocytes Absolute: 0.6 10*3/uL (ref 0.1–1.0)
Monocytes Relative: 7 %
Neutro Abs: 6.2 10*3/uL (ref 1.7–7.7)
Neutrophils Relative %: 68 %
Platelets: 326 10*3/uL (ref 150–400)
RBC: 4.36 MIL/uL (ref 3.87–5.11)
RDW: 14.3 % (ref 11.5–15.5)
WBC: 9.3 10*3/uL (ref 4.0–10.5)
nRBC: 0 % (ref 0.0–0.2)

## 2019-04-23 LAB — BASIC METABOLIC PANEL
Anion gap: 10 (ref 5–15)
BUN: 13 mg/dL (ref 6–20)
CO2: 18 mmol/L — ABNORMAL LOW (ref 22–32)
Calcium: 9.6 mg/dL (ref 8.9–10.3)
Chloride: 109 mmol/L (ref 98–111)
Creatinine, Ser: 0.84 mg/dL (ref 0.44–1.00)
GFR calc Af Amer: >60 mL/min (ref >60)
GFR calc non Af Amer: >60 mL/min (ref >60)
Glucose, Bld: 107 mg/dL — ABNORMAL HIGH (ref 70–99)
Potassium: 4.6 mmol/L (ref 3.5–5.1)
Sodium: 137 mmol/L (ref 135–145)

## 2019-04-23 LAB — D-DIMER, QUANTITATIVE: D-Dimer, Quant: 0.7 ug/mL-FEU — ABNORMAL HIGH (ref 0.00–0.50)

## 2019-04-23 LAB — TROPONIN I: Troponin I: <0.03 ng/mL (ref <0.03)

## 2019-04-23 MED ORDER — IOHEXOL 350 MG/ML SOLN
80.0000 mL | Freq: Once | INTRAVENOUS | Status: AC | PRN
  Administered 2019-04-23: 80 mL via INTRAVENOUS

## 2019-04-23 MED ORDER — SODIUM CHLORIDE 0.9 % IV BOLUS
1000.0000 mL | Freq: Once | INTRAVENOUS | Status: AC
  Administered 2019-04-23: 1000 mL via INTRAVENOUS

## 2019-04-23 MED ORDER — LORAZEPAM 2 MG/ML IJ SOLN
0.5000 mg | Freq: Once | INTRAMUSCULAR | Status: AC
  Administered 2019-04-23: 0.5 mg via INTRAVENOUS
  Filled 2019-04-23: qty 1

## 2019-04-23 NOTE — ED Notes (Signed)
Patient transported to CT 

## 2019-04-23 NOTE — ED Triage Notes (Signed)
Per patient last night around 2300 she was awoke from her sleep with a burning sensation in her mid chest and SOB. 324 aspirin given by EMS en route

## 2019-04-23 NOTE — ED Provider Notes (Addendum)
[facility] EMERGENCY DEPARTMENT Provider Note   CSN: [id number] Arrival date & time: 04/23/19  0040    History   Chief Complaint Chief Complaint  Patient presents with  . Chest Pain    HPI Jessica Banks is a 38 y.o. female.     Patient with history of GERD, migraine headaches presents the emergency department today with acute onset of chest pressure and shortness of breath.  Pressure is described as a burning sensation.  Patient went to sleep feeling normal.  She did take an Aleve prior to going to bed for ongoing right lower extremity "restlessness".  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  Symptoms were severe at onset and gradually improved.  EMS was called by family.  EMS has given aspirin prior to arrival.  Her symptoms have been waxing and waning but not as severe as onset.  Patient denies risk factors for pulmonary embolism including: unilateral leg swelling, history of DVT/PE/other blood clots, use of exogenous hormones, recent immobilizations, recent surgery, recent travel (>4hr segment), malignancy, hemoptysis.  No history of hypertension, high cholesterol, current smoking, diabetes.  Family history of heart disease in older patients with comorbidities.       Past Medical History:  Diagnosis Date  . Abnormal Pap smear    CIN I  . Anemia   . Anxiety   . Depression    off meds now, ok now  . Enlarged thyroid gland    nromal function  . Fibroid   . GERD (gastroesophageal reflux disease)   . Migraine   . Obesity   . Ovarian cyst   . Vaginal Pap smear, abnormal    ok now    Patient Active Problem List   Diagnosis Date Noted  . SVD (spontaneous vaginal delivery) 10/18/2017  . Second-degree perineal laceration, with delivery 10/18/2017  . Obstetrical laceration: labial minora lacerations R>L 10/18/2017  . Normal labor and delivery 10/17/2017   . Postpartum care following vaginal delivery (10/31) 10/17/2017  . Near syncope 07/16/2017  . Pregnant 07/16/2017  . Anxiety   . Migraine   . Obesity     Past Surgical History:  Procedure Laterality Date  . LAPAROSCOPIC GASTRIC SLEEVE RESECTION       OB History    Gravida  2   Para  1   Term  1   Preterm      AB  1   Living  1     SAB  1   TAB      Ectopic      Multiple  0   Live Births  1            Home Medications    Prior to Admission medications   Medication Sig Start Date End Date Taking? Authorizing Provider  acetaminophen (TYLENOL) 500 MG tablet Take 1,000 mg by mouth every 6 (six) hours as needed for headache.    [provider]  doxylamine, Sleep, (UNISOM) 25 MG tablet Take 25 mg by mouth at bedtime as needed for sleep. With B6.     [provider]  ibuprofen (ADVIL,MOTRIN) 600 MG tablet Take 1 tablet (600 mg total) by mouth every 6 (six) hours. 10/19/17   Aloha Gell, MD  pantoprazole (PROTONIX) 40 MG tablet Take 40 mg by mouth daily. 06/17/17   [provider]  Prenatal Vit-Fe Fumarate-FA (PRENATAL MULTIVITAMIN) TABS  tablet Take 1 tablet by mouth daily at 12 noon.    [provider]  ranitidine (ZANTAC) 150 MG tablet Take 150 mg by mouth daily as needed for heartburn.    [provider]    Family History Family History  Problem Relation Age of Onset  . Thyroid disease Mother        s/p thyroidectomy  . Arthritis Mother   . Leukemia Maternal Grandmother   . Stroke Maternal Grandmother   . Cancer Maternal Grandfather        lung  . Heart disease Paternal Grandmother   . COPD Paternal Grandfather     Social History Social History   Tobacco Use  . Smoking status: Former Smoker    Last attempt to quit: 06/07/2013    Years since quitting: 5.8  . Smokeless tobacco: Never Used  Substance Use Topics  . Alcohol use: No    Comment: social, prior to preg  . Drug use: No     Allergies    Novocain [procaine]   Review of Systems Review of Systems  Constitutional: Negative for diaphoresis and fever.  Eyes: Negative for redness.  Respiratory: Positive for shortness of breath. Negative for cough and wheezing.   Cardiovascular: Positive for chest pain. Negative for palpitations and leg swelling.  Gastrointestinal: Negative for abdominal pain, nausea and vomiting.  Genitourinary: Negative for dysuria.  Musculoskeletal: Negative for back pain and neck pain.  Skin: Negative for rash.  Neurological: Positive for numbness (tingling in extremities). Negative for syncope and light-headedness.  Psychiatric/Behavioral: The patient is not nervous/anxious.      Physical Exam Updated Vital Signs BP 131/86 (BP Location: Right Arm)   Pulse (!) 119   Temp 98.4 F (36.9 C) (Oral)   Resp (!) 22   SpO2 100%   Physical Exam Vitals signs and nursing note reviewed.  Constitutional:      Appearance: She is well-developed. She is not diaphoretic.  HENT:     Head: Normocephalic and atraumatic.     Mouth/Throat:     Mouth: Mucous membranes are not dry.  Eyes:     Conjunctiva/sclera: Conjunctivae normal.  Neck:     Musculoskeletal: Normal range of motion and neck supple. No muscular tenderness.     Vascular: Normal carotid pulses. No carotid bruit or JVD.     Trachea: Trachea normal. No tracheal deviation.  Cardiovascular:     Rate and Rhythm: Regular rhythm. Tachycardia present.     Pulses: No decreased pulses.     Heart sounds: Normal heart sounds, S1 normal and S2 normal. No murmur.  Pulmonary:     Effort: Pulmonary effort is normal. Tachypnea (mild) present. No accessory muscle usage or respiratory distress.     Breath sounds: No wheezing.  Chest:     Chest wall: No tenderness.  Abdominal:     General: Bowel sounds are normal.     Palpations: Abdomen is soft.     Tenderness: There is no abdominal tenderness. There is no guarding or rebound.  Musculoskeletal: Normal range  of motion.  Skin:    General: Skin is warm and dry.     Coloration: Skin is not pale.  Neurological:     Mental Status: She is alert.  Psychiatric:        Mood and Affect: Mood is anxious.      ED Treatments / Results  Labs (all labs ordered are listed, but only abnormal results are displayed) Labs Reviewed  BASIC METABOLIC PANEL -  Abnormal; Notable for the following components:      Result Value   CO2 18 (*)    Glucose, Bld 107 (*)    All other components within normal limits  D-DIMER, QUANTITATIVE (NOT AT [facility]) - Abnormal; Notable for the following components:   D-Dimer, Quant 0.70 (*)    All other components within normal limits  CBC WITH DIFFERENTIAL/PLATELET  TROPONIN I  TSH    EKG EKG Interpretation  Date/Time:  Wednesday Apr 23 2019 00:52:55 EDT Ventricular Rate:  107 PR Interval:    QRS Duration: 89 QT Interval:  330 QTC Calculation: 441 R Axis:   15 Text Interpretation:  Sinus tachycardia Abnormal R-wave progression, early transition Borderline T wave abnormalities No prior for comparison Confirmed by Thayer Jew [id number]) on 04/23/2019 1:01:11 AM   Radiology Dg Chest 2 View  Result Date: 04/23/2019 CLINICAL DATA:  Chest pain, shortness of breath EXAM: CHEST - 2 VIEW COMPARISON:  None. FINDINGS: Heart and mediastinal contours are within normal limits. No focal opacities or effusions. No acute bony abnormality. IMPRESSION: No active cardiopulmonary disease. Electronically Signed   By: Rolm Baptise M.D.   On: 04/23/2019 01:47    Procedures Procedures (including critical care time)  Medications Ordered in ED Medications  sodium chloride 0.9 % bolus 1,000 mL (0 mLs Intravenous Stopped 04/23/19 0308)  LORazepam (ATIVAN) injection 0.5 mg (0.5 mg Intravenous Given 04/23/19 0216)  iohexol (OMNIPAQUE) 350 MG/ML injection 80 mL (80 mLs Intravenous Contrast Given 04/23/19 0221)     Initial Impression / Assessment and Plan / ED Course  I have reviewed the triage  vital signs and the nursing notes.  Pertinent labs & imaging results that were available during my care of the patient were reviewed by me and considered in my medical decision making (see chart for details).        Patient seen and examined. Work-up initiated. Medications ordered. EKG personally reviewed. Shows sinus tachycardia.   Vital signs reviewed and are as follows: BP 131/86 (BP Location: Right Arm)   Pulse (!) 119   Temp 98.4 F (36.9 C) (Oral)   Resp (!) 22   SpO2 100%   Patient has had recurrent symptoms with persistent tachycardia at times during the ED stay.  D-dimer is elevated and CT angio ordered to definitively rule out pulmonary embolism.  0.5 mg Ativan ordered for anxiety.  Will give fluid bolus as well.  No hypoxia.  3:15 AM patient feeling better.  CT reviewed personally.  No signs of PE or pneumonia.  After discussion with patient, TSH added on.  She will follow-up on this as an outpatient.  We discussed signs and symptoms to return including worsening pain, persistent shortness of breath, lightheadedness or syncope.  Encouraged PCP follow-up for routine physical which she has not had in the past couple of years.  Patient verbalizes understanding and agrees with plan.    Final Clinical Impressions(s) / ED Diagnoses   Final diagnoses:  Chest pressure  Tachycardia   Patient with abrupt chest pain and pressure waking her from sleep.  Symptoms gradually improved.  EKG shows sinus tachycardia without ischemia.  No significant risk factors for ACS and patient has a low risk heart score.  Patient evaluated for pulmonary embolism.  D-dimer is elevated and CT did not demonstrate any PE or other problems with the lungs.  No pneumothorax or pericardial effusion.  Patient does have a history of GERD and did take NSAIDs before going to bed tonight.  Pain possibly GI in etiology,?  Esophageal spasm, trouble with anxiety.  No indications for admission at this point.  Patient  appears well and is not hypoxic.  No significant pain or discomfort at time of discharge.  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  She seems reliable to return if symptoms change or worsen.  ED Discharge Orders    None         Carlisle Cater, PA-C 04/23/19 2552    Merryl Hacker, MD 04/23/19 [phone]

## 2019-04-23 NOTE — Discharge Instructions (Addendum)
Please read and follow all provided instructions.  Your diagnoses today include:  1. Chest pressure   2. Tachycardia     Tests performed today include: An EKG of your heart - shows sinus tachycardia A chest x-ray - is clear Cardiac enzymes - a blood test for heart muscle damage, no sign of heart irritation Blood counts and electrolytes D-dimer - elevated to 0.70 CT of the chest - fortunately no signs of blood clots or other problems TSH - pending, please follow-up on this with your PCP if needed Vital signs. See below for your results today.   Medications prescribed:  None  Take any prescribed medications only as directed.  Follow-up instructions: Please follow-up with your primary care provider for further evaluation of your symptoms.   Return instructions:  SEEK IMMEDIATE MEDICAL ATTENTION IF: You have severe chest pain, especially if the pain is crushing or pressure-like and spreads to the arms, back, neck, or jaw, or if you have sweating, nausea (feeling sick to your stomach), or shortness of breath. THIS IS AN EMERGENCY. Don't wait to see if the pain will go away. Get medical help at once. Call 911 or 0 (operator). DO NOT drive yourself to the hospital.  Your chest pain gets worse and does not go away with rest.  You have an attack of chest pain lasting longer than usual, despite rest and treatment with the medications your caregiver has prescribed.  You wake from sleep with chest pain or shortness of breath. You feel dizzy or faint. You have chest pain not typical of your usual pain for which you originally saw your caregiver.  You have any other emergent concerns regarding your health.  Your vital signs today were: BP 119/80    Pulse (!) 105    Temp 98.4 F (36.9 C) (Oral)    Resp (!) 22    SpO2 100%  If your blood pressure (BP) was elevated above 135/85 this visit, please have this repeated by your doctor within one month. --------------

## 2019-04-30 ENCOUNTER — Other Ambulatory Visit: Payer: Self-pay

## 2019-04-30 ENCOUNTER — Ambulatory Visit (INDEPENDENT_AMBULATORY_CARE_PROVIDER_SITE_OTHER): Payer: BLUE CROSS/BLUE SHIELD | Admitting: Internal Medicine

## 2019-04-30 ENCOUNTER — Encounter: Payer: Self-pay | Admitting: Internal Medicine

## 2019-04-30 VITALS — BP 122/74 | HR 91 | Resp 18 | Ht 60.0 in | Wt 215.0 lb

## 2019-04-30 DIAGNOSIS — G2581 Restless legs syndrome: Secondary | ICD-10-CM | POA: Diagnosis not present

## 2019-04-30 DIAGNOSIS — R0789 Other chest pain: Secondary | ICD-10-CM

## 2019-04-30 DIAGNOSIS — F419 Anxiety disorder, unspecified: Secondary | ICD-10-CM

## 2019-04-30 DIAGNOSIS — Z6841 Body Mass Index (BMI) 40.0 and over, adult: Secondary | ICD-10-CM

## 2019-04-30 DIAGNOSIS — K219 Gastro-esophageal reflux disease without esophagitis: Secondary | ICD-10-CM

## 2019-04-30 DIAGNOSIS — Z9884 Bariatric surgery status: Secondary | ICD-10-CM

## 2019-04-30 MED ORDER — ESCITALOPRAM OXALATE 10 MG PO TABS: 10.0000 mg | ORAL_TABLET | Freq: Every day | ORAL | 1 refills | Status: DC

## 2019-04-30 NOTE — Patient Instructions (Signed)

## 2019-05-05 ENCOUNTER — Encounter: Payer: Self-pay | Admitting: Internal Medicine

## 2019-05-06 ENCOUNTER — Other Ambulatory Visit: Payer: Self-pay | Admitting: Internal Medicine

## 2019-05-06 DIAGNOSIS — F411 Generalized anxiety disorder: Secondary | ICD-10-CM | POA: Diagnosis not present

## 2019-05-06 MED ORDER — ALPRAZOLAM 0.25 MG PO TABS
ORAL_TABLET | ORAL | 0 refills | Status: DC
Start: 2019-05-06 — End: 2019-10-09

## 2019-05-14 DIAGNOSIS — F411 Generalized anxiety disorder: Secondary | ICD-10-CM | POA: Diagnosis not present

## 2019-05-16 ENCOUNTER — Other Ambulatory Visit: Payer: Self-pay | Admitting: Internal Medicine

## 2019-05-16 MED ORDER — ESCITALOPRAM OXALATE 20 MG PO TABS: 20.0000 mg | ORAL_TABLET | Freq: Every day | ORAL | 2 refills | Status: DC

## 2019-05-22 DIAGNOSIS — F411 Generalized anxiety disorder: Secondary | ICD-10-CM | POA: Diagnosis not present

## 2019-05-25 NOTE — Progress Notes (Signed)
Virtual Visit via Video   This visit type was conducted due to national recommendations for restrictions regarding the COVID-19 Pandemic (e.g. social distancing) in an effort to limit this patient's exposure and mitigate transmission in our community.  Due to her co-morbid illnesses, this patient is at least at moderate risk for complications without adequate follow up.  This format is felt to be most appropriate for this patient at this time.  All issues noted in this document were discussed and addressed.  A limited physical exam was performed with this format.    This visit type was conducted due to national recommendations for restrictions regarding the COVID-19 Pandemic (e.g. social distancing) in an effort to limit this patient's exposure and mitigate transmission in our community.  Patients identity confirmed using two different identifiers.  This format is felt to be most appropriate for this patient at this time.  All issues noted in this document were discussed and addressed.  No physical exam was performed (except for noted visual exam findings with Video Visits).    Date:  05/25/2019   ID:  Jessica Banks, DOB July 10, 1981, MRN [id number]  Patient Location:  Home  Provider location:   Office    Chief Complaint:  "I have anxiety, I am on the front lines"  History of Present Illness:    MADDELINE Banks is a 38 y.o. female who presents via video conferencing for a telehealth visit today.    The patient does not have symptoms concerning for COVID-19 infection (fever, chills, cough, or new shortness of breath).   She presents today for virtual visit. She prefers this method of contact due to COVID-19 pandemic.  She wants to be evaluated for both chest pain and anxiety today. She reports she went to [facility] ED on 5/6 for further evaluation of acute cp and sob. She reports she was awakened with cp and sob. She reports being under a lot of stress. 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 This has caused her a lot of anxiety. ED workup was negative for acute cardiac event. She is worried about bringing home the virus to her family. She thinks she needs meds to help her get through this.     Past Medical History:  Diagnosis Date  . Abnormal Pap smear    CIN I  . Anemia   . Anxiety   . Depression    off meds now, ok now  . Enlarged thyroid gland    nromal function  . Fibroid   . GERD (gastroesophageal reflux disease)   . Migraine   . Obesity   . Ovarian cyst   . Vaginal Pap smear, abnormal    ok now   Past Surgical History:  Procedure Laterality Date  . LAPAROSCOPIC GASTRIC SLEEVE RESECTION       No outpatient medications have been marked as taking for the 04/30/19 encounter (Office Visit) with Glendale Chard, MD.     Allergies:   Novocain [procaine]   Social History   Tobacco Use  . Smoking status: Former Smoker    Years: 4.00    Types: Cigarettes    Last attempt to quit: 06/07/2013    Years since quitting: 5.9  . Smokeless tobacco: Never Used  . Tobacco comment: only smoked on the weekends  Substance Use Topics  . Alcohol use: No    Comment: social, prior to preg  . Drug use: No  Family Hx: The patient's family history includes Arthritis in her mother; COPD in her paternal grandfather; Cancer in her maternal grandfather; Heart disease in her paternal grandmother; Leukemia in her maternal grandmother; Stroke in her maternal grandmother; Thyroid disease in her mother.  ROS:   Please see the history of present illness.    Review of Systems  Constitutional: Negative.   Respiratory: Negative.   Cardiovascular: Positive for chest pain.  Gastrointestinal: Negative.   Neurological: Negative.   Psychiatric/Behavioral: The patient is nervous/anxious.     All other systems reviewed and are negative.   Labs/Other Tests and Data Reviewed:    Recent Labs: 04/23/2019: BUN 13; Creatinine, Ser 0.84;  Hemoglobin 12.0; Platelets 326; Potassium 4.6; Sodium 137; TSH 2.117   Recent Lipid Panel No results found for: CHOL, TRIG, HDL, CHOLHDL, LDLCALC, LDLDIRECT  Wt Readings from Last 3 Encounters:  04/30/19 215 lb (97.5 kg)  10/17/17 238 lb (108 kg)  07/16/17 228 lb 3.2 oz (103.5 kg)     Exam:    Vital Signs:  BP 122/74 (BP Location: Left Arm, Patient Position: Sitting, Cuff Size: Normal) Comment: pt provided  Pulse 91 Comment: pt provided  Resp 18   Ht 5' (1.524 m)   Wt 215 lb (97.5 kg) Comment: pt provided  LMP 04/02/2019   SpO2 99%   BMI 41.99 kg/m     Physical Exam  Constitutional: She is oriented to person, place, and time and well-developed, well-nourished, and in no distress.  HENT:  Head: Normocephalic and atraumatic.  Neck: Normal range of motion.  Pulmonary/Chest: Effort normal.  Neurological: She is alert and oriented to person, place, and time.  Psychiatric: Affect normal.  Nursing note and vitals reviewed.   ASSESSMENT & PLAN:     1. Other chest pain  Recent ER visit notes were reviewed in full detail. She and I agree her sx are not likely cardiac related.   2. Gastroesophageal reflux disease without esophagitis  She is encouraged to take pepcid once daily. She can get this OTC. This is the likely cause of her chest pain that she experienced last week.   3. Anxiety  She will continue with behavioral therapy. I will send in rx lexapro 10mg  daily. She is advised to take with evening meal. She agrees to f/u in four weeks for re-evaluation.   4. Restless leg syndrome  She may benefit from ropirinole nightly. However, since we are starting lexapro, we will see if her sx persist while on this therapy.   5. Class 3 severe obesity due to excess calories without serious comorbidity with body mass index (BMI) of 40.0 to 44.9 in adult [facility]  She is s/p bariatric surgery. She is encouraged to incorporate more exercise into her daily routine. This may help her  relieve some stress.     COVID-19 Education: The signs and symptoms of COVID-19 were discussed with the patient and how to seek care for testing (follow up with PCP or arrange E-visit).  The importance of social distancing was discussed today.  Patient Risk:   After full review of this patients clinical status, I feel that they are at least moderate risk at this time.  Time:   Today, I have spent 25 minutes/ 30 seconds with the patient with telehealth technology discussing above diagnoses.  This Is a failed virtual visit. Call started off well initially, then connection was lost. Remainder of call was done on the phone.    Medication Adjustments/Labs and Tests Ordered: Current  medicines are reviewed at length with the patient today.  Concerns regarding medicines are outlined above.   Tests Ordered: No orders of the defined types were placed in this encounter.   Medication Changes: Meds ordered this encounter  Medications  . DISCONTD: escitalopram (LEXAPRO) 10 MG tablet    Sig: Take 1 tablet (10 mg total) by mouth daily.    Dispense:  30 tablet    Refill:  1    Disposition:  Follow up in 4 week(s)  Signed, [name], MD

## 2019-06-02 ENCOUNTER — Other Ambulatory Visit: Payer: Self-pay

## 2019-06-02 ENCOUNTER — Ambulatory Visit: Payer: BC Managed Care – PPO | Admitting: Internal Medicine

## 2019-06-02 ENCOUNTER — Encounter: Payer: Self-pay | Admitting: Internal Medicine

## 2019-06-02 VITALS — BP 112/76 | HR 76 | Temp 98.3°F | Ht 60.0 in | Wt 221.3 lb

## 2019-06-02 DIAGNOSIS — G44209 Tension-type headache, unspecified, not intractable: Secondary | ICD-10-CM | POA: Diagnosis not present

## 2019-06-02 DIAGNOSIS — G43009 Migraine without aura, not intractable, without status migrainosus: Secondary | ICD-10-CM

## 2019-06-02 DIAGNOSIS — Z6841 Body Mass Index (BMI) 40.0 and over, adult: Secondary | ICD-10-CM

## 2019-06-02 DIAGNOSIS — F419 Anxiety disorder, unspecified: Secondary | ICD-10-CM | POA: Diagnosis not present

## 2019-06-02 MED ORDER — CYCLOBENZAPRINE HCL 10 MG PO TABS: 10.0000 mg | ORAL_TABLET | Freq: Three times a day (TID) | ORAL | 1 refills | Status: DC | PRN

## 2019-06-02 MED ORDER — ESCITALOPRAM OXALATE 20 MG PO TABS: 20.0000 mg | ORAL_TABLET | Freq: Every day | ORAL | 2 refills | Status: DC

## 2019-06-02 NOTE — Progress Notes (Signed)
Subjective:     Patient ID: Jessica Banks , female    DOB: 1981-10-18 , 38 y.o.   MRN: [id number]   Chief Complaint  Patient presents with  . lexapro f/u    HPI  Anxiety Presents for follow-up visit. Patient reports no chest pain, confusion, decreased concentration, depressed mood, insomnia, irritability or panic. The severity of symptoms is mild. The quality of sleep is good.   Compliance with medications is 76-100%.     Past Medical History:  Diagnosis Date  . Abnormal Pap smear    CIN I  . Anemia   . Anxiety   . Depression    off meds now, ok now  . Enlarged thyroid gland    nromal function  . Fibroid   . GERD (gastroesophageal reflux disease)   . Migraine   . Obesity   . Ovarian cyst   . Vaginal Pap smear, abnormal    ok now     Family History  Problem Relation Age of Onset  . Thyroid disease Mother        s/p thyroidectomy  . Arthritis Mother   . Leukemia Maternal Grandmother   . Stroke Maternal Grandmother   . Cancer Maternal Grandfather        lung  . Heart disease Paternal Grandmother   . COPD Paternal Grandfather      Current Outpatient Medications:  .  ALPRAZolam (XANAX) 0.25 MG tablet, Take one tab daily as needed, Disp: 30 tablet, Rfl: 0 .  cyclobenzaprine (FLEXERIL) 10 MG tablet, Take 1 tablet (10 mg total) by mouth 3 (three) times daily as needed for muscle spasms., Disp: 30 tablet, Rfl: 1 .  escitalopram (LEXAPRO) 20 MG tablet, Take 1 tablet (20 mg total) by mouth daily., Disp: 90 tablet, Rfl: 2 .  Magnesium 400 MG TABS, Take by mouth daily., Disp: , Rfl:  .  Multiple Vitamins-Minerals (HAIR SKIN AND NAILS FORMULA) TABS, Take 1 tablet by mouth at bedtime., Disp: , Rfl:  .  pantoprazole (PROTONIX) 40 MG tablet, Take 40 mg by mouth at bedtime. , Disp: , Rfl: 4 .  zonisamide (ZONEGRAN) 100 MG capsule, Take 100 mg by mouth at bedtime., Disp: , Rfl:    Allergies  Allergen Reactions  . Novocain [Procaine] Anaphylaxis     Review of Systems   Constitutional: Negative.  Negative for irritability.  Respiratory: Negative.   Cardiovascular: Negative.  Negative for chest pain.  Gastrointestinal: Negative.   Neurological: Negative.   Psychiatric/Behavioral: Negative.  Negative for confusion and decreased concentration. The patient does not have insomnia.      Today's Vitals   06/02/19 1406  BP: 112/76  Pulse: 76  Temp: 98.3 F (36.8 C)  TempSrc: Oral  Weight: 221 lb 4.8 oz (100.4 kg)  Height: 5' (1.524 m)   Body mass index is 43.22 kg/m.   Objective:  Physical Exam Vitals signs and nursing note reviewed.  Constitutional:      Appearance: Normal appearance.  HENT:     Head: Normocephalic and atraumatic.  Cardiovascular:     Rate and Rhythm: Normal rate and regular rhythm.     Heart sounds: Normal heart sounds.  Pulmonary:     Effort: Pulmonary effort is normal.     Breath sounds: Normal breath sounds.  Skin:    General: Skin is warm.  Neurological:     General: No focal deficit present.     Mental Status: She is alert.  Psychiatric:  Mood and Affect: Mood normal.        Behavior: Behavior normal.         Assessment And Plan:     1. Anxiety  Improved with use of lexapro 20mg  daily and therapy. She plans to continue with therapy. Pt agrees to rto in 3 months for re-evaluation. She is encouraged to take meds for at least 4-6 months to decrease risk of relapse of her symptoms. She agrees to continue to take meds as directed.   2. Tension headache  Chronic. She was given rx flexeril 10mg  tid prn. She may also benefit from massage therapy vs. Acupuncture vs. Chiropractic therapy.    3. Migraine without aura and without status migrainosus, not intractable  Chronic. She was given sample of Ubrelvy, 50mg  to use prn migraines. She is also advised that she would likely benefit from magnesium supplementation. I suggest 400mg  nightly.   4. Class 3 severe obesity due to excess calories without serious  comorbidity with body mass index (BMI) of 40.0 to 44.9 in adult [facility])  Importance of achieving optimal weight to decrease risk of cardiovascular disease and cancers was discussed with the patient in full detail. She is encouraged to start slowly - start with 10 minutes twice daily at least three to four days per week and to gradually build to 30 minutes five days weekly. She was given tips to incorporate more activity into her daily routine - take stairs when possible, park farther away from her job, grocery stores, etc.    [name], MD    THE PATIENT IS ENCOURAGED TO PRACTICE SOCIAL DISTANCING DUE TO THE COVID-19 PANDEMIC.

## 2019-06-02 NOTE — Patient Instructions (Signed)

## 2019-06-03 DIAGNOSIS — F411 Generalized anxiety disorder: Secondary | ICD-10-CM | POA: Diagnosis not present

## 2019-06-10 ENCOUNTER — Encounter: Payer: Self-pay | Admitting: Internal Medicine

## 2019-06-10 ENCOUNTER — Other Ambulatory Visit: Payer: Self-pay | Admitting: Internal Medicine

## 2019-06-10 MED ORDER — NURTEC 75 MG PO TBDP: 75.0000 mg | ORAL_TABLET | Freq: Every day | ORAL | 1 refills | Status: DC | PRN

## 2019-06-17 DIAGNOSIS — F411 Generalized anxiety disorder: Secondary | ICD-10-CM | POA: Diagnosis not present

## 2019-07-31 DIAGNOSIS — R079 Chest pain, unspecified: Secondary | ICD-10-CM | POA: Insufficient documentation

## 2019-07-31 DIAGNOSIS — R0789 Other chest pain: Secondary | ICD-10-CM | POA: Diagnosis not present

## 2019-07-31 DIAGNOSIS — R002 Palpitations: Secondary | ICD-10-CM | POA: Diagnosis not present

## 2019-07-31 DIAGNOSIS — R9431 Abnormal electrocardiogram [ECG] [EKG]: Secondary | ICD-10-CM | POA: Insufficient documentation

## 2019-07-31 DIAGNOSIS — E669 Obesity, unspecified: Secondary | ICD-10-CM | POA: Diagnosis not present

## 2019-08-26 DIAGNOSIS — F411 Generalized anxiety disorder: Secondary | ICD-10-CM | POA: Diagnosis not present

## 2019-08-31 ENCOUNTER — Other Ambulatory Visit: Payer: Self-pay | Admitting: Internal Medicine

## 2019-09-02 ENCOUNTER — Ambulatory Visit: Payer: BC Managed Care – PPO | Admitting: Internal Medicine

## 2019-09-09 ENCOUNTER — Ambulatory Visit: Payer: BC Managed Care – PPO | Admitting: Internal Medicine

## 2019-09-09 ENCOUNTER — Other Ambulatory Visit: Payer: Self-pay

## 2019-09-09 ENCOUNTER — Encounter: Payer: Self-pay | Admitting: Internal Medicine

## 2019-09-09 VITALS — BP 122/70 | HR 83 | Temp 98.6°F | Ht 60.2 in | Wt 230.6 lb

## 2019-09-09 DIAGNOSIS — F5101 Primary insomnia: Secondary | ICD-10-CM | POA: Diagnosis not present

## 2019-09-09 DIAGNOSIS — G43009 Migraine without aura, not intractable, without status migrainosus: Secondary | ICD-10-CM

## 2019-09-09 DIAGNOSIS — R9431 Abnormal electrocardiogram [ECG] [EKG]: Secondary | ICD-10-CM | POA: Diagnosis not present

## 2019-09-09 DIAGNOSIS — F419 Anxiety disorder, unspecified: Secondary | ICD-10-CM | POA: Diagnosis not present

## 2019-09-09 MED ORDER — TEMAZEPAM 15 MG PO CAPS: 15.0000 mg | ORAL_CAPSULE | Freq: Every evening | ORAL | 2 refills | Status: DC | PRN

## 2019-09-14 NOTE — Progress Notes (Signed)
Subjective:     Patient ID: Jessica Banks , female    DOB: Oct 17, 1981 , 38 y.o.   MRN: [id number]   Chief Complaint  Patient presents with  . Anxiety    HPI  She is here today for f/u anxiety.  She feels that the lexapro is effective in controlling her symptoms.   Anxiety Presents for follow-up visit. Patient reports no chest pain or feeling of choking. Symptoms occur occasionally. The quality of sleep is fair. Nighttime awakenings: none.   Compliance with medications is 76-100%.     Past Medical History:  Diagnosis Date  . Abnormal Pap smear    CIN I  . Anemia   . Anxiety   . Depression    off meds now, ok now  . Enlarged thyroid gland    nromal function  . Fibroid   . GERD (gastroesophageal reflux disease)   . Migraine   . Obesity   . Ovarian cyst   . Vaginal Pap smear, abnormal    ok now     Family History  Problem Relation Age of Onset  . Thyroid disease Mother        s/p thyroidectomy  . Arthritis Mother   . Leukemia Maternal Grandmother   . Stroke Maternal Grandmother   . Cancer Maternal Grandfather        lung  . Heart disease Paternal Grandmother   . COPD Paternal Grandfather      Current Outpatient Medications:  .  ALPRAZolam (XANAX) 0.25 MG tablet, Take one tab daily as needed, Disp: 30 tablet, Rfl: 0 .  cyclobenzaprine (FLEXERIL) 10 MG tablet, Take 1 tablet (10 mg total) by mouth 3 (three) times daily as needed for muscle spasms., Disp: 30 tablet, Rfl: 1 .  escitalopram (LEXAPRO) 20 MG tablet, Take 1 tablet (20 mg total) by mouth daily., Disp: 90 tablet, Rfl: 2 .  Magnesium 400 MG TABS, Take by mouth daily., Disp: , Rfl:  .  Multiple Vitamins-Minerals (HAIR SKIN AND NAILS FORMULA) TABS, Take 1 tablet by mouth at bedtime., Disp: , Rfl:  .  NURTEC 75 MG TBDP, TAKE 75 MG BY MOUTH DAILY AS NEEDED (MIGRAINE)., Disp: 8 tablet, Rfl: 1 .  pantoprazole (PROTONIX) 40 MG tablet, Take 40 mg by mouth at bedtime. , Disp: , Rfl: 4 .  temazepam (RESTORIL)  15 MG capsule, Take 1 capsule (15 mg total) by mouth at bedtime as needed for sleep., Disp: 30 capsule, Rfl: 2 .  zonisamide (ZONEGRAN) 100 MG capsule, Take 100 mg by mouth at bedtime., Disp: , Rfl:    Allergies  Allergen Reactions  . Novocain [Procaine] Anaphylaxis     Review of Systems  Constitutional: Negative.   Respiratory: Negative.   Cardiovascular: Negative.  Negative for chest pain.       She states she had EKG performed at work. It was found to be abnormal, so she followed up with cardiologist friend of hers. She states echo was performed. Cardiologist plans to see in her 3 months. No further testing is recommended at this time.   Gastrointestinal: Negative.   Neurological: Negative.   Psychiatric/Behavioral: Negative.      Today's Vitals   09/09/19 1128  BP: 122/70  Pulse: 83  Temp: 98.6 F (37 C)  TempSrc: Oral  SpO2: 97%  Weight: 230 lb 9.6 oz (104.6 kg)  Height: 5' 0.2" (1.529 m)   Body mass index is 44.74 kg/m.   Objective:  Physical Exam Vitals signs and nursing note  reviewed.  Constitutional:      Appearance: Normal appearance.  HENT:     Head: Normocephalic and atraumatic.  Cardiovascular:     Rate and Rhythm: Normal rate and regular rhythm.     Heart sounds: Normal heart sounds.  Pulmonary:     Effort: Pulmonary effort is normal.     Breath sounds: Normal breath sounds.  Skin:    General: Skin is warm.  Neurological:     General: No focal deficit present.     Mental Status: She is alert.  Psychiatric:        Mood and Affect: Mood normal.        Behavior: Behavior normal.         Assessment And Plan:     1. Anxiety  Improved with use of Lexapro. She will continue with current meds.   2. Abnormal EKG  EKG reviewed and will be scanned into her chart. She reports decreased EF. I think she should have sleep study. However, she declines at this time b/c this was not recommended by the cardiologist. I will request records and review upon  receipt.   3. Migraine without aura and without status migrainosus, not intractable  Chronic. She was given refill on Nurtec. She was also given sample of 100mg  Ubrelvy to use as needed, so she can determine which medication is more effective for her.   4. Primary insomnia  She was given rx temazepam to use nightly as needed.   [name], MD    THE PATIENT IS ENCOURAGED TO PRACTICE SOCIAL DISTANCING DUE TO THE COVID-19 PANDEMIC.

## 2019-09-28 ENCOUNTER — Encounter: Payer: Self-pay | Admitting: Internal Medicine

## 2019-09-30 ENCOUNTER — Encounter: Payer: Self-pay | Admitting: Internal Medicine

## 2019-10-07 ENCOUNTER — Other Ambulatory Visit: Payer: Self-pay | Admitting: Internal Medicine

## 2019-10-08 NOTE — Telephone Encounter (Signed)
Please refill patient's prescription 

## 2019-10-14 ENCOUNTER — Encounter: Payer: Self-pay | Admitting: Internal Medicine

## 2019-10-16 DIAGNOSIS — Z20828 Contact with and (suspected) exposure to other viral communicable diseases: Secondary | ICD-10-CM | POA: Diagnosis not present

## 2019-10-30 DIAGNOSIS — R002 Palpitations: Secondary | ICD-10-CM | POA: Diagnosis not present

## 2019-10-30 DIAGNOSIS — R0789 Other chest pain: Secondary | ICD-10-CM | POA: Diagnosis not present

## 2019-10-30 DIAGNOSIS — E669 Obesity, unspecified: Secondary | ICD-10-CM | POA: Diagnosis not present

## 2019-10-31 DIAGNOSIS — F411 Generalized anxiety disorder: Secondary | ICD-10-CM | POA: Diagnosis not present

## 2019-11-03 DIAGNOSIS — R002 Palpitations: Secondary | ICD-10-CM | POA: Diagnosis not present

## 2019-11-12 DIAGNOSIS — H40033 Anatomical narrow angle, bilateral: Secondary | ICD-10-CM | POA: Diagnosis not present

## 2019-11-12 DIAGNOSIS — H04123 Dry eye syndrome of bilateral lacrimal glands: Secondary | ICD-10-CM | POA: Diagnosis not present

## 2019-11-20 ENCOUNTER — Other Ambulatory Visit: Payer: Self-pay | Admitting: Internal Medicine

## 2019-11-25 DIAGNOSIS — F4323 Adjustment disorder with mixed anxiety and depressed mood: Secondary | ICD-10-CM | POA: Diagnosis not present

## 2019-11-27 DIAGNOSIS — R002 Palpitations: Secondary | ICD-10-CM | POA: Diagnosis not present

## 2019-11-27 DIAGNOSIS — E669 Obesity, unspecified: Secondary | ICD-10-CM | POA: Diagnosis not present

## 2019-11-27 DIAGNOSIS — R0789 Other chest pain: Secondary | ICD-10-CM | POA: Diagnosis not present

## 2019-12-02 ENCOUNTER — Encounter: Payer: Self-pay | Admitting: Internal Medicine

## 2019-12-10 ENCOUNTER — Other Ambulatory Visit: Payer: Self-pay

## 2019-12-10 MED ORDER — AIMOVIG 70 MG/ML ~~LOC~~ SOAJ
70.0000 mg | SUBCUTANEOUS | 3 refills | Status: DC
Start: 2019-12-10 — End: 2020-03-01

## 2019-12-16 ENCOUNTER — Other Ambulatory Visit: Payer: Self-pay | Admitting: Internal Medicine

## 2019-12-16 NOTE — Telephone Encounter (Signed)
Temazepam refill 

## 2019-12-19 NOTE — L&D Delivery Note (Addendum)
[facility] Delivery Note  Jessica Banks is a 39 y.o. now [id number] s/p SVD at [redacted]w[redacted]d who was admitted for active labor and SROM.   ROM: 2h 71m with clear then transitioned to light meconium stained fluid GBS Status: Positive Maximum Maternal Temperature: 97.5  Delivery Note At 10:10 AM a viable female was delivered via Vaginal, Spontaneous (Presentation: Right Occiput Anterior).  APGAR: 8, 9; weight 7 lbs 3.7 oz (3280 grams).   Placenta status: Spontaneous, Intact.  Cord: 3 vessels with the following complications: None.  Cord pH: n/a  Anesthesia: Epidural Episiotomy: None Lacerations: Superficial vaginal and bilateral periurethral - all hemostatic Suture Repair: None Est. Blood Loss (mL): 100   Postpartum Planning [x]  Mom to postpartum.  Baby to Couplet care / Skin to Skin.  [x]  plans to breast feed [x]  message to sent to schedule follow-up  [x]  vaccines UTD  Laury Deep, MSN, CNM 12/06/20, 10:32 AM

## 2020-01-20 DIAGNOSIS — R790 Abnormal level of blood mineral: Secondary | ICD-10-CM | POA: Diagnosis not present

## 2020-01-20 DIAGNOSIS — E559 Vitamin D deficiency, unspecified: Secondary | ICD-10-CM | POA: Diagnosis not present

## 2020-01-20 DIAGNOSIS — R635 Abnormal weight gain: Secondary | ICD-10-CM | POA: Diagnosis not present

## 2020-01-20 DIAGNOSIS — R5382 Chronic fatigue, unspecified: Secondary | ICD-10-CM | POA: Diagnosis not present

## 2020-01-20 DIAGNOSIS — E7800 Pure hypercholesterolemia, unspecified: Secondary | ICD-10-CM | POA: Diagnosis not present

## 2020-01-20 DIAGNOSIS — N951 Menopausal and female climacteric states: Secondary | ICD-10-CM | POA: Diagnosis not present

## 2020-01-20 DIAGNOSIS — R7309 Other abnormal glucose: Secondary | ICD-10-CM | POA: Diagnosis not present

## 2020-01-20 DIAGNOSIS — R7303 Prediabetes: Secondary | ICD-10-CM | POA: Diagnosis not present

## 2020-01-23 LAB — BASIC METABOLIC PANEL
CO2: 26 — AB (ref 13–22)
Chloride: 106 (ref 99–108)
Glucose: 79
Potassium: 5 (ref 3.4–5.3)
Sodium: 141 (ref 137–147)

## 2020-01-23 LAB — HEPATIC FUNCTION PANEL
Alkaline Phosphatase: 136 — AB (ref 25–125)
Bilirubin, Total: 0.3

## 2020-01-23 LAB — VITAMIN D 25 HYDROXY (VIT D DEFICIENCY, FRACTURES): Vit D, 25-Hydroxy: 20.6

## 2020-01-23 LAB — IRON,TIBC AND FERRITIN PANEL
Ferritin: 7
Iron: 29

## 2020-01-23 LAB — HEMOGLOBIN A1C: Hemoglobin A1C: 6.1

## 2020-01-29 DIAGNOSIS — E7800 Pure hypercholesterolemia, unspecified: Secondary | ICD-10-CM | POA: Diagnosis not present

## 2020-01-29 DIAGNOSIS — Z6841 Body Mass Index (BMI) 40.0 and over, adult: Secondary | ICD-10-CM | POA: Diagnosis not present

## 2020-01-29 DIAGNOSIS — R790 Abnormal level of blood mineral: Secondary | ICD-10-CM | POA: Diagnosis not present

## 2020-01-29 DIAGNOSIS — Z1331 Encounter for screening for depression: Secondary | ICD-10-CM | POA: Diagnosis not present

## 2020-01-29 DIAGNOSIS — R7303 Prediabetes: Secondary | ICD-10-CM | POA: Diagnosis not present

## 2020-01-29 DIAGNOSIS — Z1339 Encounter for screening examination for other mental health and behavioral disorders: Secondary | ICD-10-CM | POA: Diagnosis not present

## 2020-02-06 DIAGNOSIS — Z6841 Body Mass Index (BMI) 40.0 and over, adult: Secondary | ICD-10-CM | POA: Diagnosis not present

## 2020-02-06 DIAGNOSIS — R7303 Prediabetes: Secondary | ICD-10-CM | POA: Diagnosis not present

## 2020-02-12 DIAGNOSIS — Z6841 Body Mass Index (BMI) 40.0 and over, adult: Secondary | ICD-10-CM | POA: Diagnosis not present

## 2020-02-12 DIAGNOSIS — E7800 Pure hypercholesterolemia, unspecified: Secondary | ICD-10-CM | POA: Diagnosis not present

## 2020-02-20 DIAGNOSIS — Z6841 Body Mass Index (BMI) 40.0 and over, adult: Secondary | ICD-10-CM | POA: Diagnosis not present

## 2020-02-20 DIAGNOSIS — R7303 Prediabetes: Secondary | ICD-10-CM | POA: Diagnosis not present

## 2020-02-25 DIAGNOSIS — K219 Gastro-esophageal reflux disease without esophagitis: Secondary | ICD-10-CM | POA: Insufficient documentation

## 2020-02-26 DIAGNOSIS — E559 Vitamin D deficiency, unspecified: Secondary | ICD-10-CM | POA: Diagnosis not present

## 2020-02-26 DIAGNOSIS — E7800 Pure hypercholesterolemia, unspecified: Secondary | ICD-10-CM | POA: Diagnosis not present

## 2020-02-26 DIAGNOSIS — Z6841 Body Mass Index (BMI) 40.0 and over, adult: Secondary | ICD-10-CM | POA: Diagnosis not present

## 2020-03-01 ENCOUNTER — Ambulatory Visit (INDEPENDENT_AMBULATORY_CARE_PROVIDER_SITE_OTHER): Payer: BC Managed Care – PPO | Admitting: Internal Medicine

## 2020-03-01 ENCOUNTER — Encounter: Payer: Self-pay | Admitting: Internal Medicine

## 2020-03-01 ENCOUNTER — Other Ambulatory Visit: Payer: Self-pay

## 2020-03-01 VITALS — BP 112/66 | HR 91 | Temp 98.7°F | Ht 61.2 in | Wt 248.0 lb

## 2020-03-01 DIAGNOSIS — E049 Nontoxic goiter, unspecified: Secondary | ICD-10-CM | POA: Diagnosis not present

## 2020-03-01 DIAGNOSIS — Z6841 Body Mass Index (BMI) 40.0 and over, adult: Secondary | ICD-10-CM

## 2020-03-01 DIAGNOSIS — E559 Vitamin D deficiency, unspecified: Secondary | ICD-10-CM | POA: Diagnosis not present

## 2020-03-01 DIAGNOSIS — K219 Gastro-esophageal reflux disease without esophagitis: Secondary | ICD-10-CM | POA: Diagnosis not present

## 2020-03-01 DIAGNOSIS — Z0000 Encounter for general adult medical examination without abnormal findings: Secondary | ICD-10-CM | POA: Diagnosis not present

## 2020-03-01 DIAGNOSIS — Z8349 Family history of other endocrine, nutritional and metabolic diseases: Secondary | ICD-10-CM

## 2020-03-01 LAB — POCT URINALYSIS DIPSTICK
Bilirubin, UA: NEGATIVE
Blood, UA: NEGATIVE
Glucose, UA: NEGATIVE
Ketones, UA: NEGATIVE
Leukocytes, UA: NEGATIVE
Nitrite, UA: NEGATIVE
Protein, UA: NEGATIVE
Spec Grav, UA: 1.025 (ref 1.010–1.025)
Urobilinogen, UA: 0.2 E.U./dL
pH, UA: 7 (ref 5.0–8.0)

## 2020-03-01 MED ORDER — CYCLOBENZAPRINE HCL 10 MG PO TABS: 10.0000 mg | ORAL_TABLET | Freq: Three times a day (TID) | ORAL | 3 refills | Status: DC | PRN

## 2020-03-01 MED ORDER — ESCITALOPRAM OXALATE 20 MG PO TABS: 20.0000 mg | ORAL_TABLET | Freq: Every day | ORAL | 2 refills | Status: DC

## 2020-03-01 MED ORDER — VITAMIN D (ERGOCALCIFEROL) 1.25 MG (50000 UNIT) PO CAPS: ORAL_CAPSULE | ORAL | 0 refills | Status: DC

## 2020-03-01 MED ORDER — AIMOVIG 70 MG/ML ~~LOC~~ SOAJ: 70.0000 mg | SUBCUTANEOUS | 11 refills | Status: DC

## 2020-03-01 NOTE — Progress Notes (Signed)
This visit occurred during the SARS-CoV-2 public health emergency.  Safety protocols were in place, including screening questions prior to the visit, additional usage of staff PPE, and extensive cleaning of exam room while observing appropriate contact time as indicated for disinfecting solutions.  Subjective:     Patient ID: Jessica Banks , female    DOB: [birth year] , 39 y.o.   MRN: [id number]   Chief Complaint  Patient presents with  . Annual Exam    HPI  She is here today for a full physical examination. She is followed by Dr. Garwin Brothers for her GYN care. She is due to see her later this year.     Past Medical History:  Diagnosis Date  . Abnormal Pap smear    CIN I  . Anemia   . Anxiety   . Depression    off meds now, ok now  . Enlarged thyroid gland    nromal function  . Fibroid   . GERD (gastroesophageal reflux disease)   . Migraine   . Obesity   . Ovarian cyst   . Vaginal Pap smear, abnormal    ok now     Family History  Problem Relation Age of Onset  . Thyroid disease Mother        s/p thyroidectomy  . Arthritis Mother   . Leukemia Maternal Grandmother   . Stroke Maternal Grandmother   . Cancer Maternal Grandfather        lung  . Heart disease Paternal Grandmother   . COPD Paternal Grandfather      Current Outpatient Medications:  .  AIMOVIG 70 MG/ML SOAJ, Inject 70 mg into the skin every 30 (thirty) days., Disp: 1 pen, Rfl: 3 .  ALPRAZolam (XANAX) 0.25 MG tablet, TAKE 1 TABLET BY MOUTH EVERY DAY AS NEEDED, Disp: 30 tablet, Rfl: 0 .  cyclobenzaprine (FLEXERIL) 10 MG tablet, Take 1 tablet (10 mg total) by mouth 3 (three) times daily as needed for muscle spasms., Disp: 30 tablet, Rfl: 1 .  escitalopram (LEXAPRO) 20 MG tablet, Take 1 tablet (20 mg total) by mouth daily., Disp: 90 tablet, Rfl: 2 .  Ferrous Sulfate (IRON PO), Take by mouth. 22 mg, Disp: , Rfl:  .  Magnesium 400 MG TABS, Take by mouth daily., Disp: , Rfl:  .  Multiple Vitamins-Minerals  (HAIR SKIN AND NAILS FORMULA) TABS, Take 1 tablet by mouth at bedtime., Disp: , Rfl:  .  NURTEC 75 MG TBDP, TAKE 75 MG BY MOUTH DAILY AS NEEDED (MIGRAINE)., Disp: 8 tablet, Rfl: 1 .  pantoprazole (PROTONIX) 40 MG tablet, Take 40 mg by mouth at bedtime. , Disp: , Rfl: 4 .  temazepam (RESTORIL) 15 MG capsule, TAKE 1 CAPSULE (15 MG TOTAL) BY MOUTH AT BEDTIME AS NEEDED FOR SLEEP., Disp: 30 capsule, Rfl: 2 .  vitamin E (VITAMIN E) 180 MG (400 UNITS) capsule, Take 400 Units by mouth daily., Disp: , Rfl:    Allergies  Allergen Reactions  . Novocain [Procaine] Anaphylaxis     The patient states she uses none for birth control. Last LMP was Patient's last menstrual period was 02/08/2020.. Negative for Dysmenorrhea Negative for: breast discharge, breast lump(s), breast pain and breast self exam. Associated symptoms include abnormal vaginal bleeding. Pertinent negatives include abnormal bleeding (hematology), anxiety, decreased libido, depression, difficulty falling sleep, dyspareunia, history of infertility, nocturia, sexual dysfunction, sleep disturbances, urinary incontinence, urinary urgency, vaginal discharge and vaginal itching. Diet regular.The patient states her exercise level is    .  The patient's tobacco use is:  Social History   Tobacco Use  Smoking Status Former Smoker  . Years: 4.00  . Types: Cigarettes  . Quit date: 06/07/2013  . Years since quitting: 6.7  Smokeless Tobacco Never Used  Tobacco Comment   only smoked on the weekends  . She has been exposed to passive smoke. The patient's alcohol use is:  Social History   Substance and Sexual Activity  Alcohol Use Yes   Comment: SOCIAL    Review of Systems  Constitutional: Negative.   HENT: Negative.   Eyes: Negative.   Respiratory: Negative.   Cardiovascular: Negative.   Gastrointestinal:       She c/o worsening reflux. Has EGD scheduled in the near future. Has been taking pantoprazole.   Endocrine: Negative.   Genitourinary:  Negative.   Musculoskeletal: Negative.   Skin: Negative.   Allergic/Immunologic: Negative.   Neurological: Negative.   Hematological: Negative.   Psychiatric/Behavioral: Negative.      Today's Vitals   03/01/20 0951  BP: 112/66  Pulse: 91  Temp: 98.7 F (37.1 C)  TempSrc: Oral  Weight: 248 lb (112.5 kg)  Height: 5' 1.2" (1.554 m)   Body mass index is 46.55 kg/m.   Objective:  Physical Exam Vitals and nursing note reviewed.  Constitutional:      Appearance: Normal appearance.  HENT:     Head: Normocephalic and atraumatic.     Right Ear: Tympanic membrane, ear canal and external ear normal.     Left Ear: Tympanic membrane, ear canal and external ear normal.     Nose: Congestion: vitamin d.  Eyes:     Extraocular Movements: Extraocular movements intact.     Conjunctiva/sclera: Conjunctivae normal.     Pupils: Pupils are equal, round, and reactive to light.  Cardiovascular:     Rate and Rhythm: Normal rate and regular rhythm.     Pulses: Normal pulses.     Heart sounds: Normal heart sounds.  Pulmonary:     Effort: Pulmonary effort is normal.     Breath sounds: Normal breath sounds.  Chest:     Breasts: Tanner Score is 5.        Right: Normal.        Left: Normal.  Abdominal:     General: Bowel sounds are normal.     Palpations: Abdomen is soft.     Comments: Rounded, soft, difficult to assess organomegaly  Genitourinary:    Comments: deferred Musculoskeletal:        General: Normal range of motion.     Cervical back: Normal range of motion and neck supple.  Skin:    General: Skin is warm and dry.  Neurological:     General: No focal deficit present.     Mental Status: She is alert and oriented to person, place, and time.  Psychiatric:        Mood and Affect: Mood normal.        Behavior: Behavior normal.         Assessment And Plan:     1. Routine general medical examination at health care facility  A full exam was performed.  Importance of monthly  self breast exams was discussed with the patient. Labs not drawn b/c she had labs drawn at [facility] in Feb 2021. These were reviewed in full detail during her visit. PATIENT IS ADVISED TO GET 30-45 MINUTES REGULAR EXERCISE NO LESS THAN FOUR TO FIVE DAYS PER WEEK - BOTH WEIGHTBEARING EXERCISES AND  AEROBIC ARE RECOMMENDED.  SHE IS ADVISED TO FOLLOW A HEALTHY DIET WITH AT LEAST SIX FRUITS/VEGGIES PER DAY, DECREASE INTAKE OF RED MEAT, AND TO INCREASE FISH INTAKE TO TWO DAYS PER WEEK.  MEATS/FISH SHOULD NOT BE FRIED, BAKED OR BROILED IS PREFERABLE.  I SUGGEST WEARING SPF 50 SUNSCREEN ON EXPOSED PARTS AND ESPECIALLY WHEN IN THE DIRECT SUNLIGHT FOR AN EXTENDED PERIOD OF TIME.  PLEASE AVOID FAST FOOD RESTAURANTS AND INCREASE YOUR WATER INTAKE.   2. Goiter  I will refer her for thyroid u/s.  TSH from 2/21 is normal.   - US THYROID; Future  3. Gastroesophageal reflux disease without esophagitis  Chronic. She was given samples of Dexilant, 60mg  to take once daily in am. Advised to stop pantoprazole while on Dexilant. She is encouraged to let me know how this works for her within a week or so.    4. Vitamin D deficiency disease  Vitamin D level is 20. She was given rx vitamin D to take twice weekly x 2 months, then once weekly.   5. Class 3 severe obesity due to excess calories with serious comorbidity and body mass index (BMI) of 45.0 to 49.9 in adult (HCC)  BMI 46. She plans to have gastric sleeve revision June 2021. She is encouraged to incorporate more exercise into her daily routine.    [name], MD    THE PATIENT IS ENCOURAGED TO PRACTICE SOCIAL DISTANCING DUE TO THE COVID-19 PANDEMIC.

## 2020-03-01 NOTE — Patient Instructions (Signed)
Health Maintenance, Female Adopting a healthy lifestyle and getting preventive care are important in promoting health and wellness. Ask your health care provider about:  The right schedule for you to have regular tests and exams.  Things you can do on your own to prevent diseases and keep yourself healthy. What should I know about diet, weight, and exercise? Eat a healthy diet   Eat a diet that includes plenty of vegetables, fruits, low-fat dairy products, and lean protein.  Do not eat a lot of foods that are high in solid fats, added sugars, or sodium. Maintain a healthy weight Body mass index (BMI) is used to identify weight problems. It estimates body fat based on height and weight. Your health care provider can help determine your BMI and help you achieve or maintain a healthy weight. Get regular exercise Get regular exercise. This is one of the most important things you can do for your health. Most adults should:  Exercise for at least 150 minutes each week. The exercise should increase your heart rate and make you sweat (moderate-intensity exercise).  Do strengthening exercises at least twice a week. This is in addition to the moderate-intensity exercise.  Spend less time sitting. Even light physical activity can be beneficial. Watch cholesterol and blood lipids Have your blood tested for lipids and cholesterol at 38 years of age, then have this test every 5 years. Have your cholesterol levels checked more often if:  Your lipid or cholesterol levels are high.  You are older than 38 years of age.  You are at high risk for heart disease. What should I know about cancer screening? Depending on your health history and family history, you may need to have cancer screening at various ages. This may include screening for:  Breast cancer.  Cervical cancer.  Colorectal cancer.  Skin cancer.  Lung cancer. What should I know about heart disease, diabetes, and high blood  pressure? Blood pressure and heart disease  High blood pressure causes heart disease and increases the risk of stroke. This is more likely to develop in people who have high blood pressure readings, are of African descent, or are overweight.  Have your blood pressure checked: ? Every 3-5 years if you are 18-39 years of age. ? Every year if you are 40 years old or older. Diabetes Have regular diabetes screenings. This checks your fasting blood sugar level. Have the screening done:  Once every three years after age 40 if you are at a normal weight and have a low risk for diabetes.  More often and at a younger age if you are overweight or have a high risk for diabetes. What should I know about preventing infection? Hepatitis B If you have a higher risk for hepatitis B, you should be screened for this virus. Talk with your health care provider to find out if you are at risk for hepatitis B infection. Hepatitis C Testing is recommended for:  Everyone born from 1945 through 1965.  Anyone with known risk factors for hepatitis C. Sexually transmitted infections (STIs)  Get screened for STIs, including gonorrhea and chlamydia, if: ? You are sexually active and are younger than 38 years of age. ? You are older than 38 years of age and your health care provider tells you that you are at risk for this type of infection. ? Your sexual activity has changed since you were last screened, and you are at increased risk for chlamydia or gonorrhea. Ask your health care provider if   you are at risk.  Ask your health care provider about whether you are at high risk for HIV. Your health care provider may recommend a prescription medicine to help prevent HIV infection. If you choose to take medicine to prevent HIV, you should first get tested for HIV. You should then be tested every 3 months for as long as you are taking the medicine. Pregnancy  If you are about to stop having your period (premenopausal) and  you may become pregnant, seek counseling before you get pregnant.  Take 400 to 800 micrograms (mcg) of folic acid every day if you become pregnant.  Ask for birth control (contraception) if you want to prevent pregnancy. Osteoporosis and menopause Osteoporosis is a disease in which the bones lose minerals and strength with aging. This can result in bone fractures. If you are 65 years old or older, or if you are at risk for osteoporosis and fractures, ask your health care provider if you should:  Be screened for bone loss.  Take a calcium or vitamin D supplement to lower your risk of fractures.  Be given hormone replacement therapy (HRT) to treat symptoms of menopause. Follow these instructions at home: Lifestyle  Do not use any products that contain nicotine or tobacco, such as cigarettes, e-cigarettes, and chewing tobacco. If you need help quitting, ask your health care provider.  Do not use street drugs.  Do not share needles.  Ask your health care provider for help if you need support or information about quitting drugs. Alcohol use  Do not drink alcohol if: ? Your health care provider tells you not to drink. ? You are pregnant, may be pregnant, or are planning to become pregnant.  If you drink alcohol: ? Limit how much you use to 0-1 drink a day. ? Limit intake if you are breastfeeding.  Be aware of how much alcohol is in your drink. In the [place] one drink equals one 12 oz bottle of beer (355 mL), one 5 oz glass of wine (148 mL), or one 1 oz glass of hard liquor (44 mL). General instructions  Schedule regular health, dental, and eye exams.  Stay current with your vaccines.  Tell your health care provider if: ? You often feel depressed. ? You have ever been abused or do not feel safe at home. Summary  Adopting a healthy lifestyle and getting preventive care are important in promoting health and wellness.  Follow your health care provider's instructions about healthy  diet, exercising, and getting tested or screened for diseases.  Follow your health care provider's instructions on monitoring your cholesterol and blood pressure. This information is not intended to replace advice given to you by your health care provider. Make sure you discuss any questions you have with your health care provider. Document Revised: 11/27/2018 Document Reviewed: 11/27/2018 Elsevier Patient Education  2020 [employer]  

## 2020-03-04 ENCOUNTER — Telehealth: Payer: Self-pay

## 2020-03-04 NOTE — Telephone Encounter (Signed)
Prior authorization done for Aimovag, waiting for a response form the pt's insurance company.

## 2020-03-08 ENCOUNTER — Telehealth: Payer: Self-pay

## 2020-03-08 NOTE — Telephone Encounter (Signed)
Pt and pharm notified of approval of aimovig

## 2020-03-10 ENCOUNTER — Ambulatory Visit
Admission: RE | Admit: 2020-03-10 | Discharge: 2020-03-10 | Disposition: A | Discharge status: Home / Self Care | Payer: BC Managed Care – PPO | Source: Ambulatory Visit | Attending: Internal Medicine | Admitting: Internal Medicine

## 2020-03-10 DIAGNOSIS — E041 Nontoxic single thyroid nodule: Secondary | ICD-10-CM | POA: Diagnosis not present

## 2020-03-10 DIAGNOSIS — E049 Nontoxic goiter, unspecified: Secondary | ICD-10-CM

## 2020-03-11 ENCOUNTER — Encounter: Payer: Self-pay | Admitting: Internal Medicine

## 2020-03-11 ENCOUNTER — Other Ambulatory Visit: Payer: Self-pay

## 2020-03-11 MED ORDER — DEXLANSOPRAZOLE 60 MG PO CPDR: 60.0000 mg | DELAYED_RELEASE_CAPSULE | Freq: Every day | ORAL | 3 refills | Status: DC

## 2020-03-12 ENCOUNTER — Other Ambulatory Visit: Payer: Self-pay | Admitting: Internal Medicine

## 2020-03-18 DIAGNOSIS — E559 Vitamin D deficiency, unspecified: Secondary | ICD-10-CM | POA: Diagnosis not present

## 2020-03-18 DIAGNOSIS — Z6841 Body Mass Index (BMI) 40.0 and over, adult: Secondary | ICD-10-CM | POA: Diagnosis not present

## 2020-03-22 ENCOUNTER — Other Ambulatory Visit: Payer: Self-pay

## 2020-03-22 ENCOUNTER — Other Ambulatory Visit: Payer: Self-pay | Admitting: Internal Medicine

## 2020-03-22 MED ORDER — TEMAZEPAM 15 MG PO CAPS: 15.0000 mg | ORAL_CAPSULE | Freq: Every evening | ORAL | 2 refills | Status: DC | PRN

## 2020-03-22 MED ORDER — NURTEC 75 MG PO TBDP: 75.0000 mg | ORAL_TABLET | Freq: Every day | ORAL | 1 refills | Status: DC | PRN

## 2020-03-24 DIAGNOSIS — Z6841 Body Mass Index (BMI) 40.0 and over, adult: Secondary | ICD-10-CM | POA: Diagnosis not present

## 2020-03-24 DIAGNOSIS — E559 Vitamin D deficiency, unspecified: Secondary | ICD-10-CM | POA: Diagnosis not present

## 2020-04-02 ENCOUNTER — Encounter: Payer: Self-pay | Admitting: Internal Medicine

## 2020-04-02 DIAGNOSIS — Z87892 Personal history of anaphylaxis: Secondary | ICD-10-CM | POA: Diagnosis not present

## 2020-04-02 DIAGNOSIS — Z806 Family history of leukemia: Secondary | ICD-10-CM | POA: Diagnosis not present

## 2020-04-02 DIAGNOSIS — Z87891 Personal history of nicotine dependence: Secondary | ICD-10-CM | POA: Diagnosis not present

## 2020-04-02 DIAGNOSIS — K219 Gastro-esophageal reflux disease without esophagitis: Secondary | ICD-10-CM | POA: Diagnosis not present

## 2020-04-02 DIAGNOSIS — Z9884 Bariatric surgery status: Secondary | ICD-10-CM | POA: Diagnosis not present

## 2020-04-02 DIAGNOSIS — Z820 Family history of epilepsy and other diseases of the nervous system: Secondary | ICD-10-CM | POA: Diagnosis not present

## 2020-04-02 DIAGNOSIS — Z800 Family history of malignant neoplasm of digestive organs: Secondary | ICD-10-CM | POA: Diagnosis not present

## 2020-04-02 DIAGNOSIS — Z5309 Procedure and treatment not carried out because of other contraindication: Secondary | ICD-10-CM | POA: Diagnosis not present

## 2020-04-02 DIAGNOSIS — R7303 Prediabetes: Secondary | ICD-10-CM | POA: Diagnosis not present

## 2020-04-02 DIAGNOSIS — Z3201 Encounter for pregnancy test, result positive: Secondary | ICD-10-CM | POA: Diagnosis not present

## 2020-04-02 DIAGNOSIS — Z8249 Family history of ischemic heart disease and other diseases of the circulatory system: Secondary | ICD-10-CM | POA: Diagnosis not present

## 2020-04-02 DIAGNOSIS — Z833 Family history of diabetes mellitus: Secondary | ICD-10-CM | POA: Diagnosis not present

## 2020-04-02 DIAGNOSIS — Z884 Allergy status to anesthetic agent status: Secondary | ICD-10-CM | POA: Diagnosis not present

## 2020-04-02 DIAGNOSIS — Z79899 Other long term (current) drug therapy: Secondary | ICD-10-CM | POA: Diagnosis not present

## 2020-04-06 DIAGNOSIS — Z6841 Body Mass Index (BMI) 40.0 and over, adult: Secondary | ICD-10-CM | POA: Diagnosis not present

## 2020-04-06 DIAGNOSIS — R7303 Prediabetes: Secondary | ICD-10-CM | POA: Diagnosis not present

## 2020-04-24 IMAGING — CT CT ANGIOGRAPHY CHEST
2 of 7 series · 19 of 46 positions shown · IV contrast (APPLIED)
Comparison: Chest x-ray earlier today

CLINICAL DATA: Positive D-dimer, chest pain

EXAM:
CT ANGIOGRAPHY CHEST WITH CONTRAST
TECHNIQUE: Multidetector CT imaging of the chest was performed using the
standard protocol during bolus administration of intravenous
contrast. Multiplanar CT image reconstructions and MIPs were
obtained to evaluate the vascular anatomy.
CONTRAST:  80mL OMNIPAQUE IOHEXOL 350 MG/ML SOLN

[Series 8: thins · axial · 0.71mm/px · z∈[+1051,+1303]mm · 16 of 407 slices shown]
[im 23/407  lung]
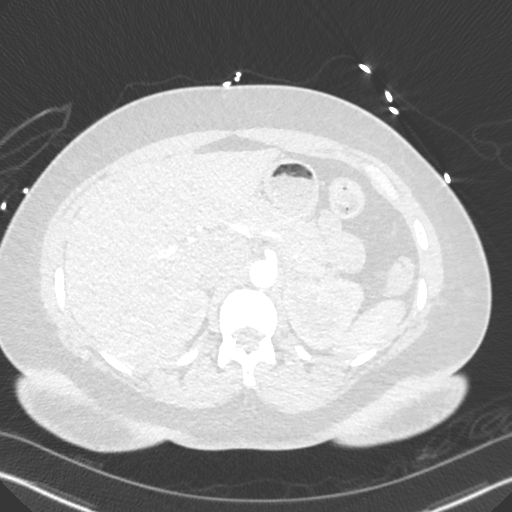
[im 46/407  soft-tissue]
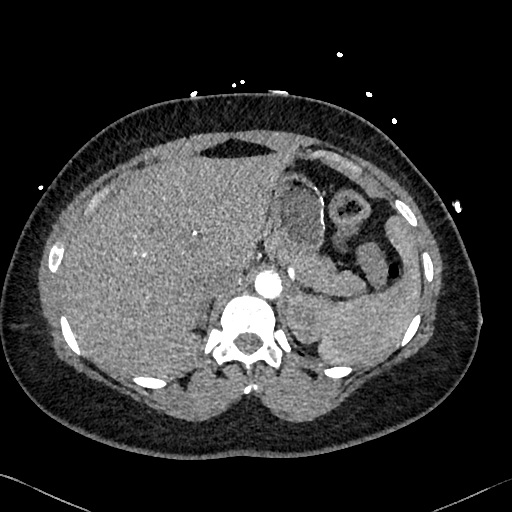
[im 68/407  lung]
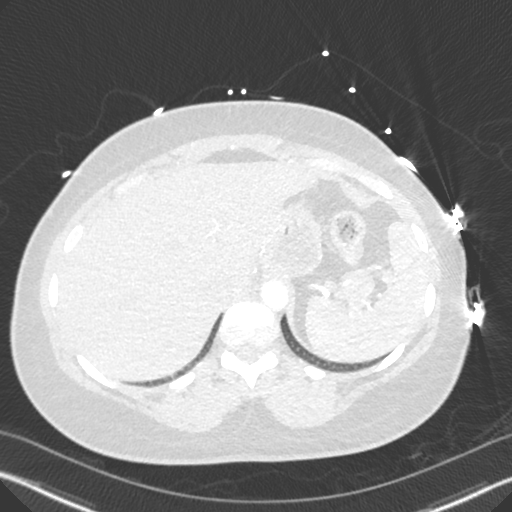
[im 91/407  soft-tissue]
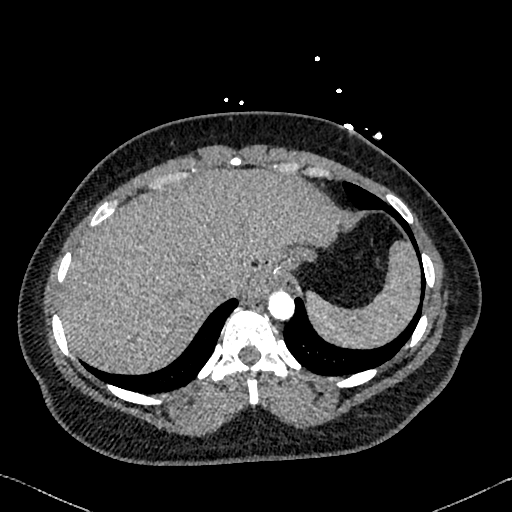
[im 113/407  lung]
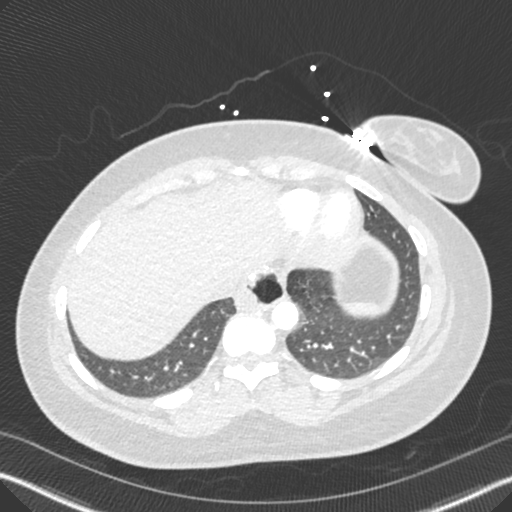
[im 136/407  soft-tissue]
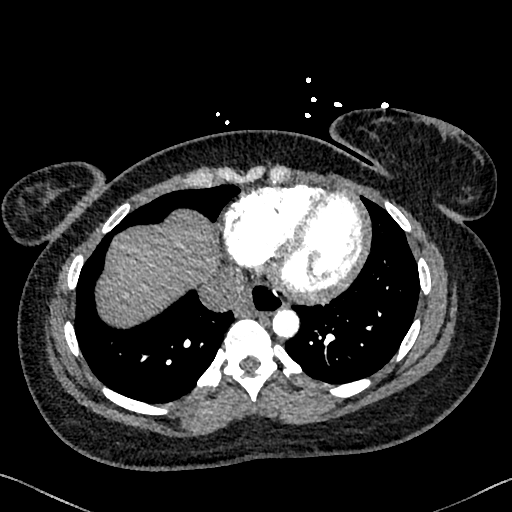
[im 158/407  lung]
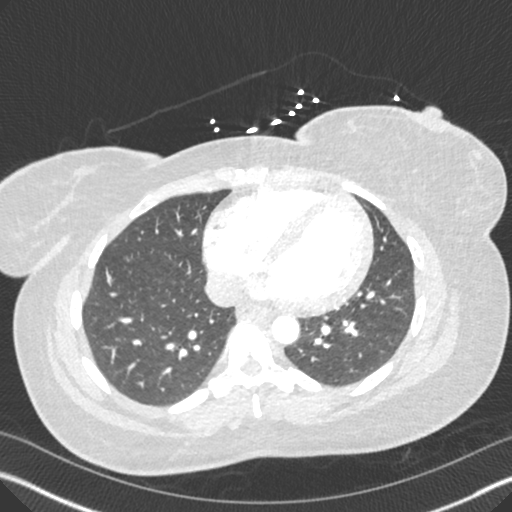
[im 181/407  soft-tissue]
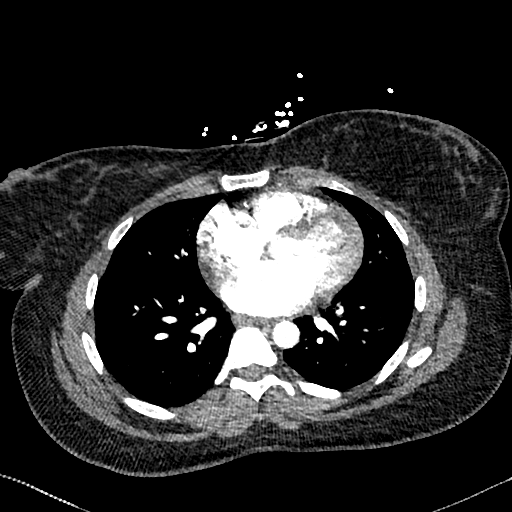
[im 226/407  lung]
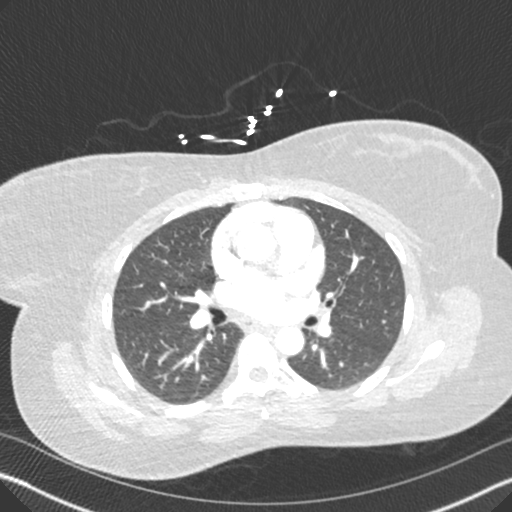
[im 249/407  soft-tissue]
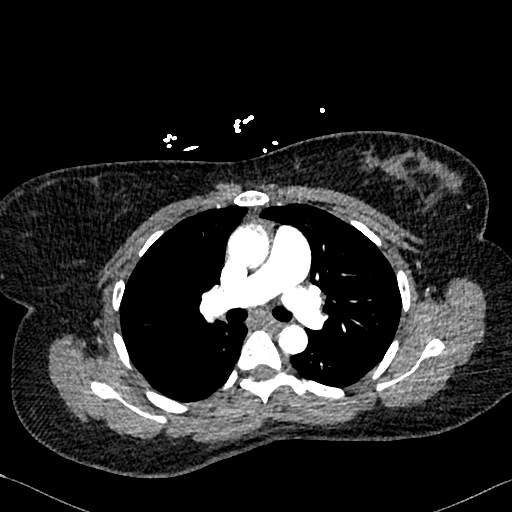
[im 271/407  lung]
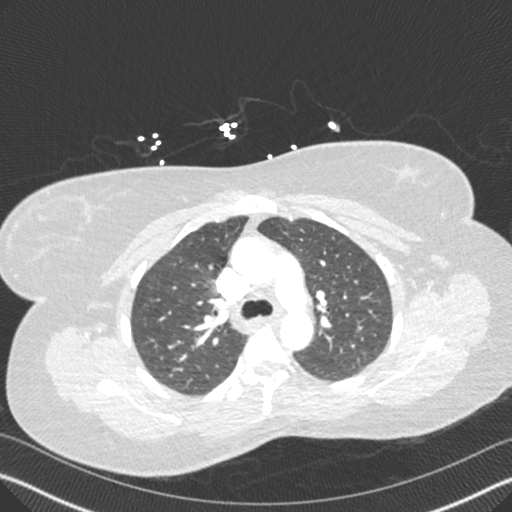
[im 294/407  soft-tissue]
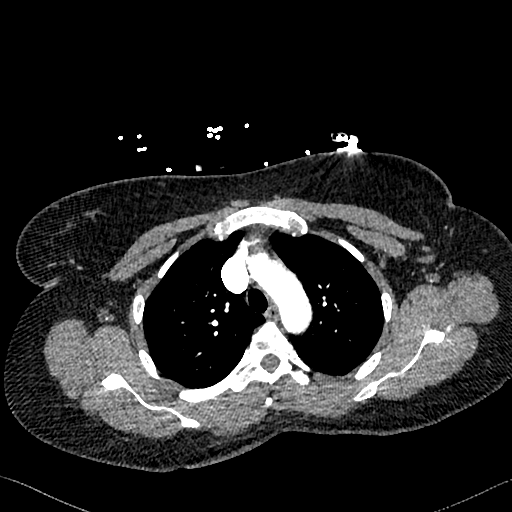
[im 316/407  lung]
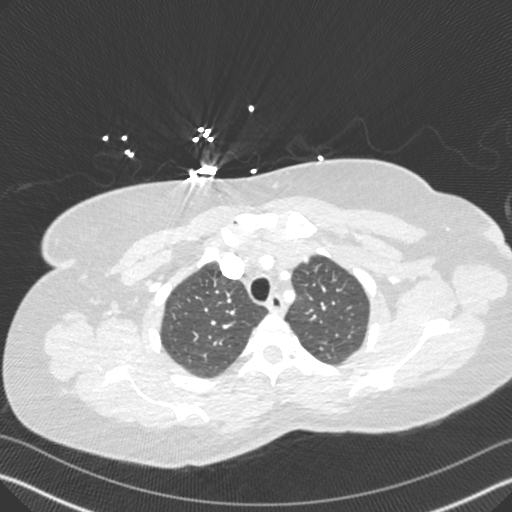
[im 339/407  soft-tissue]
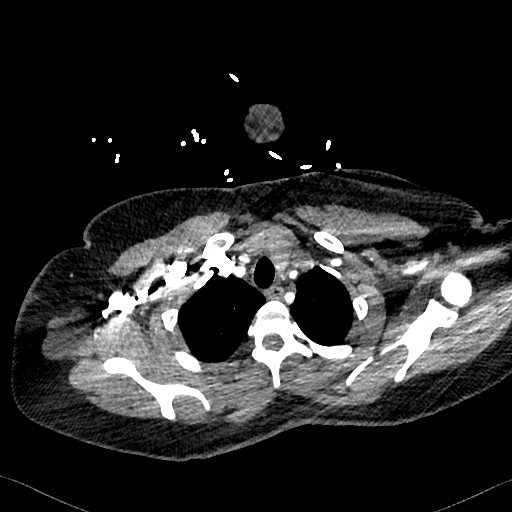
[im 361/407  lung]
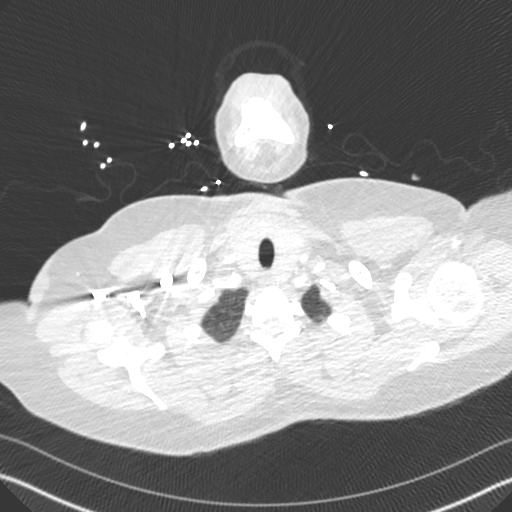
[im 384/407  soft-tissue]
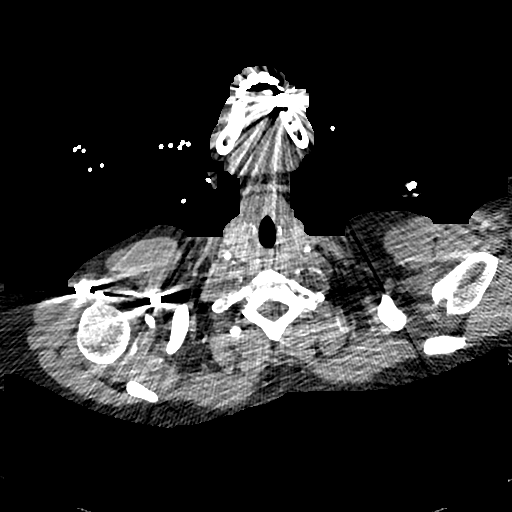

[Series 9: cor · coronal · 0.64mm/px · 3 of 141 slices shown]
[im 36/141  soft-tissue]
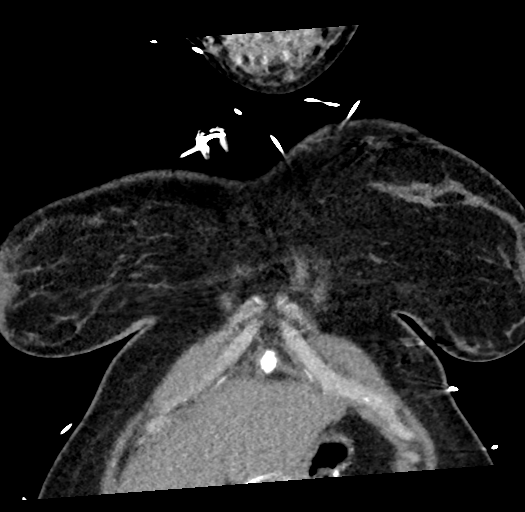
[im 71/141  soft-tissue]
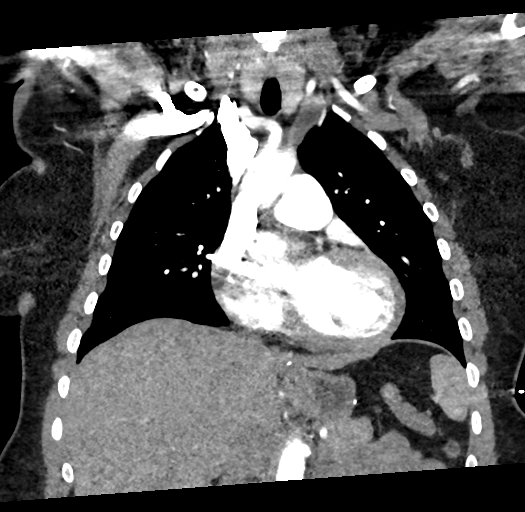
[im 106/141  soft-tissue]
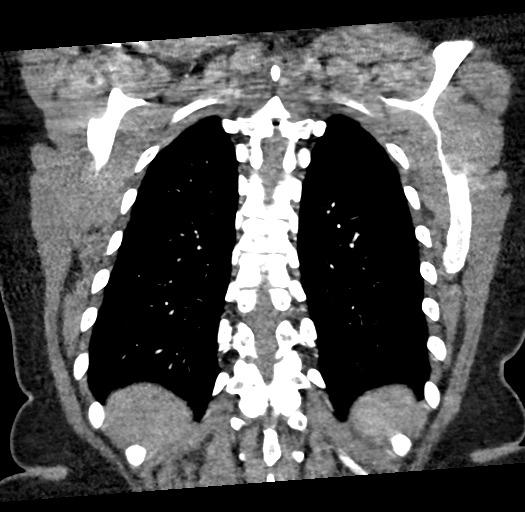

[19 of 46 positions shown; findings below may reference images not displayed]

FINDINGS: Cardiovascular: No filling defects in the pulmonary arteries to
suggest pulmonary emboli. Heart is normal size. Aorta is normal
caliber.

Mediastinum/Nodes: No mediastinal, hilar, or axillary adenopathy.
Small hiatal hernia

Lungs/Pleura: Lungs are clear. No focal airspace opacities or
suspicious nodules. No effusions.

Upper Abdomen: Postoperative changes within the stomach. No acute
findings.

Musculoskeletal: Chest wall soft tissues are unremarkable. No acute
bony abnormality.

Review of the MIP images confirms the above findings.
IMPRESSION: No evidence of pulmonary embolus.

No acute cardiopulmonary disease.

## 2020-04-24 IMAGING — CR CHEST - 2 VIEW
2 series · 2 of 2 positions shown · non-contrast
Comparison: None.

CLINICAL DATA: Chest pain, shortness of breath

EXAM:
CHEST - 2 VIEW

[chest pa]
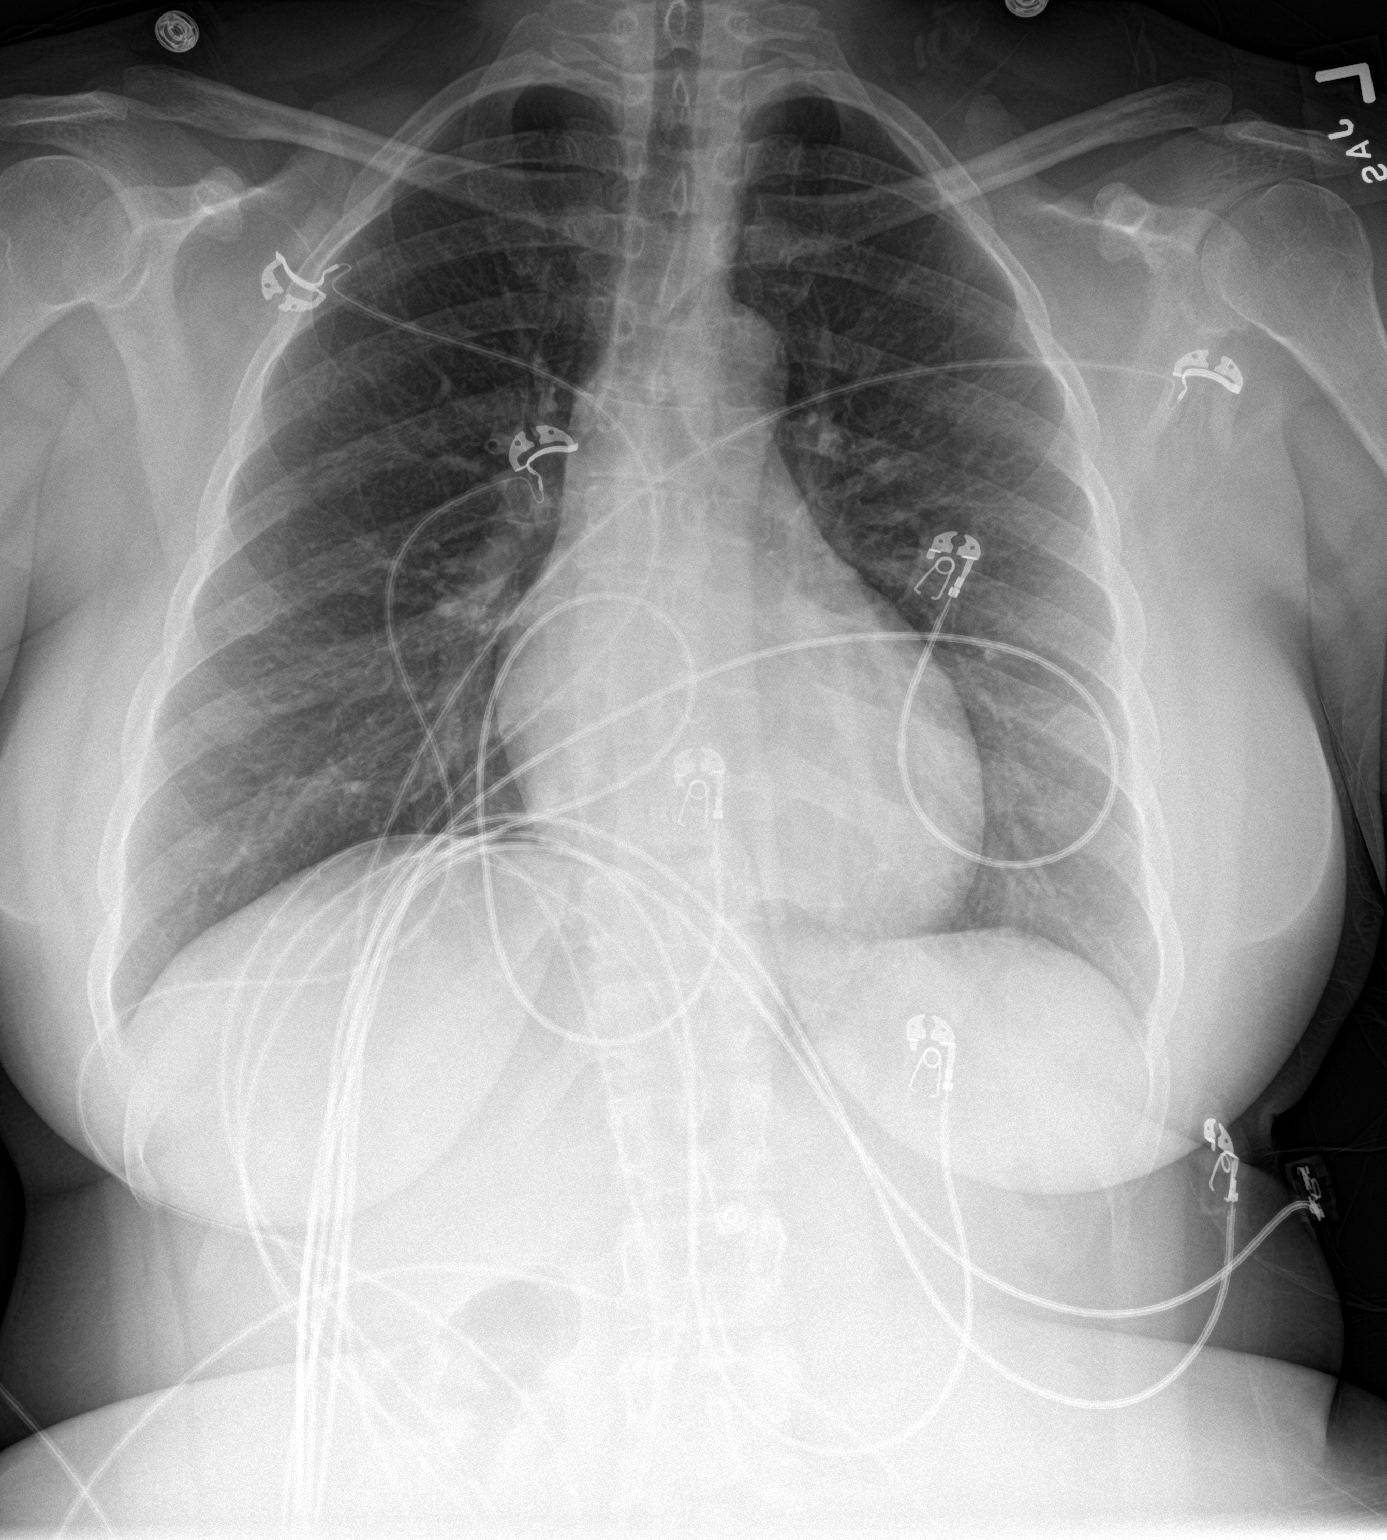

[chest lat]
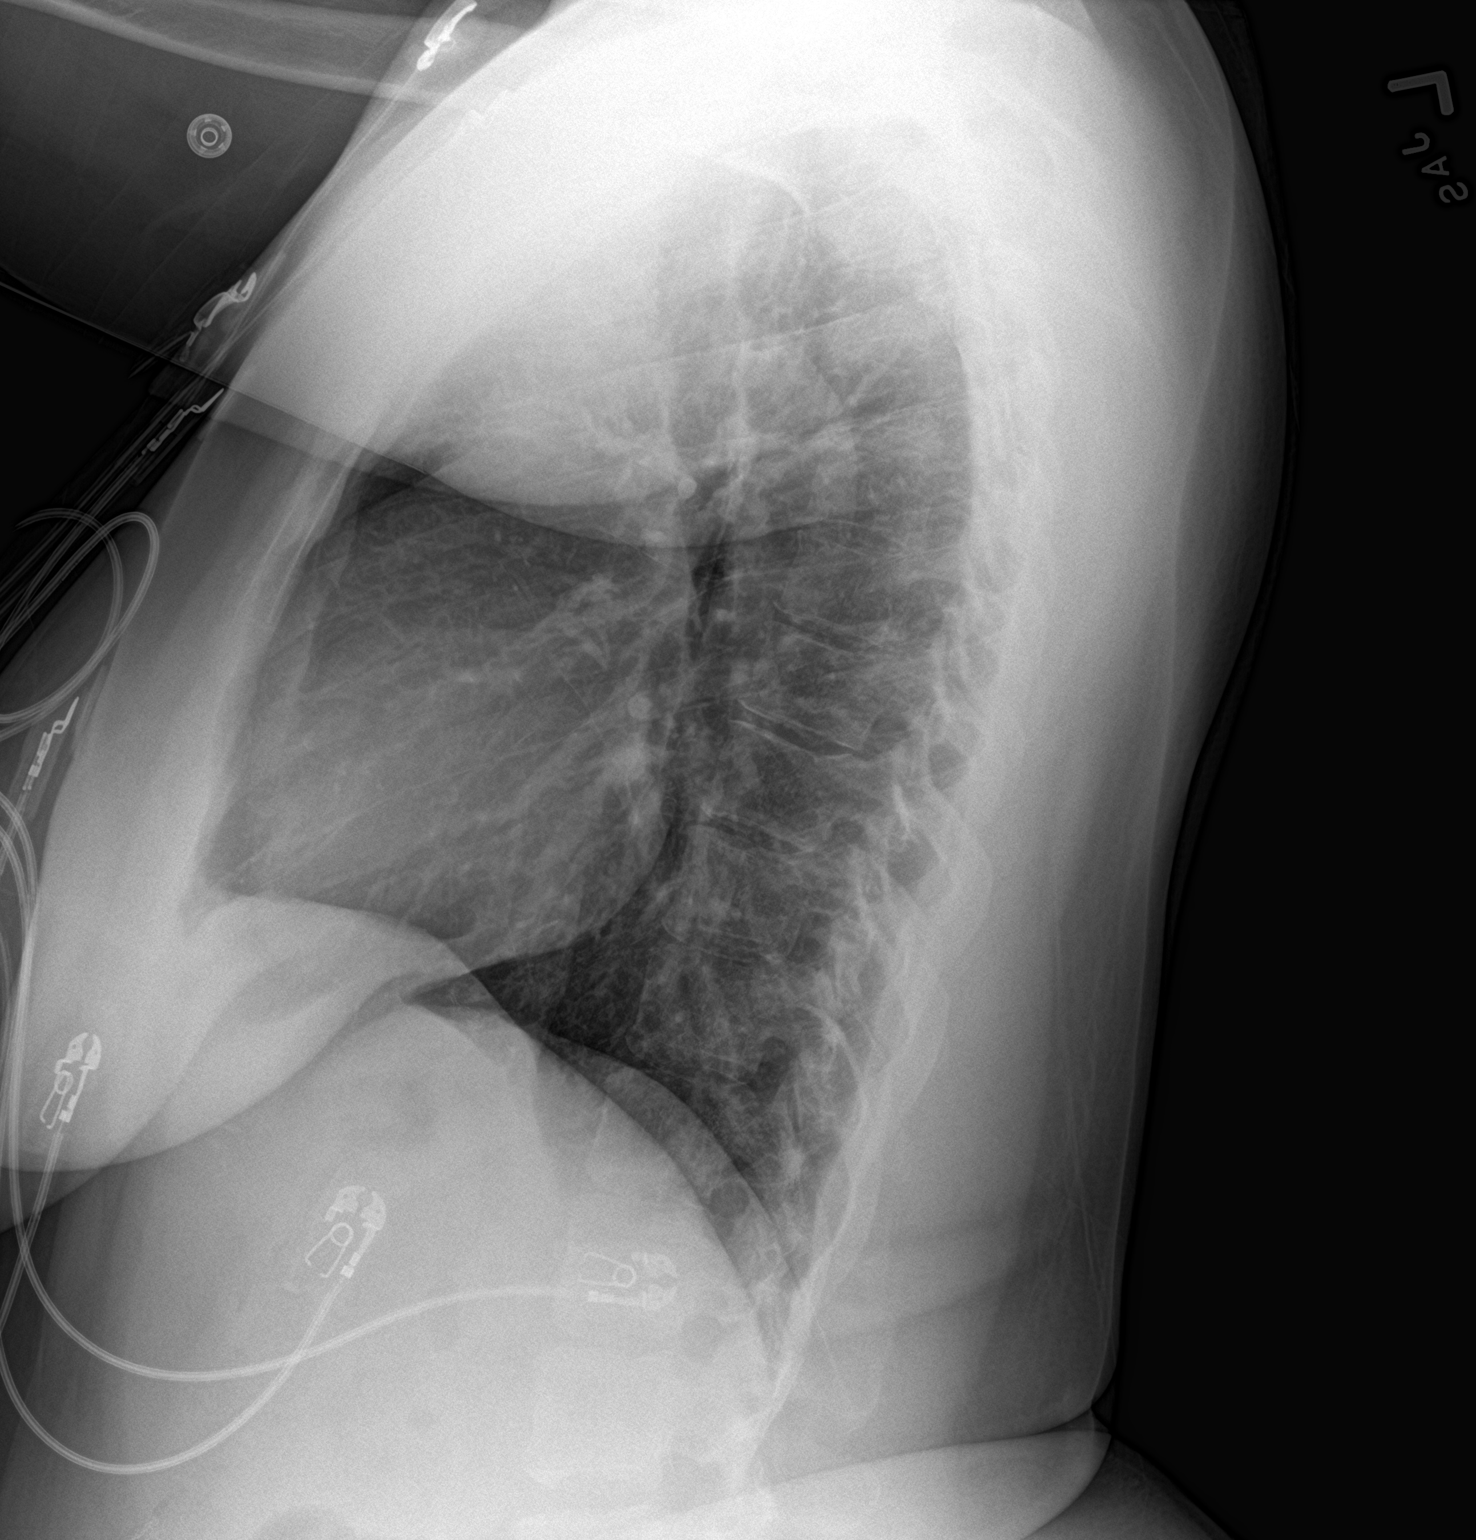

[2 of 2 positions shown; findings below may reference images not displayed]

FINDINGS: Heart and mediastinal contours are within normal limits. No focal
opacities or effusions. No acute bony abnormality.
IMPRESSION: No active cardiopulmonary disease.

## 2020-05-04 DIAGNOSIS — Z3201 Encounter for pregnancy test, result positive: Secondary | ICD-10-CM | POA: Diagnosis not present

## 2020-05-11 DIAGNOSIS — Z3A09 9 weeks gestation of pregnancy: Secondary | ICD-10-CM | POA: Diagnosis not present

## 2020-05-11 DIAGNOSIS — R7309 Other abnormal glucose: Secondary | ICD-10-CM | POA: Diagnosis not present

## 2020-05-11 DIAGNOSIS — Z3689 Encounter for other specified antenatal screening: Secondary | ICD-10-CM | POA: Diagnosis not present

## 2020-05-11 DIAGNOSIS — O09521 Supervision of elderly multigravida, first trimester: Secondary | ICD-10-CM | POA: Diagnosis not present

## 2020-05-11 LAB — OB RESULTS CONSOLE RUBELLA ANTIBODY, IGM: Rubella: IMMUNE

## 2020-05-11 LAB — OB RESULTS CONSOLE HGB/HCT, BLOOD
HCT: 32 (ref 29–41)
Hemoglobin: 10.3

## 2020-05-11 LAB — OB RESULTS CONSOLE PLATELET COUNT: Platelets: 416

## 2020-05-11 LAB — OB RESULTS CONSOLE HIV ANTIBODY (ROUTINE TESTING): HIV: NONREACTIVE

## 2020-05-11 LAB — OB RESULTS CONSOLE RPR: RPR: NONREACTIVE

## 2020-05-11 LAB — OB RESULTS CONSOLE ABO/RH: RH Type: POSITIVE

## 2020-05-11 LAB — OB RESULTS CONSOLE ANTIBODY SCREEN: Antibody Screen: NEGATIVE

## 2020-05-11 LAB — OB RESULTS CONSOLE HEPATITIS B SURFACE ANTIGEN: Hepatitis B Surface Ag: NEGATIVE

## 2020-05-18 DIAGNOSIS — Z3A12 12 weeks gestation of pregnancy: Secondary | ICD-10-CM | POA: Diagnosis not present

## 2020-05-18 DIAGNOSIS — Z1151 Encounter for screening for human papillomavirus (HPV): Secondary | ICD-10-CM | POA: Diagnosis not present

## 2020-05-18 DIAGNOSIS — Z3A10 10 weeks gestation of pregnancy: Secondary | ICD-10-CM | POA: Diagnosis not present

## 2020-05-18 DIAGNOSIS — Z3480 Encounter for supervision of other normal pregnancy, unspecified trimester: Secondary | ICD-10-CM | POA: Diagnosis not present

## 2020-05-18 DIAGNOSIS — O09521 Supervision of elderly multigravida, first trimester: Secondary | ICD-10-CM | POA: Diagnosis not present

## 2020-05-18 DIAGNOSIS — Z124 Encounter for screening for malignant neoplasm of cervix: Secondary | ICD-10-CM | POA: Diagnosis not present

## 2020-05-18 LAB — OB RESULTS CONSOLE GC/CHLAMYDIA
Chlamydia: NEGATIVE
Gonorrhea: NEGATIVE

## 2020-05-31 DIAGNOSIS — O09521 Supervision of elderly multigravida, first trimester: Secondary | ICD-10-CM | POA: Diagnosis not present

## 2020-05-31 DIAGNOSIS — Z3A12 12 weeks gestation of pregnancy: Secondary | ICD-10-CM | POA: Diagnosis not present

## 2020-06-10 DIAGNOSIS — O09521 Supervision of elderly multigravida, first trimester: Secondary | ICD-10-CM | POA: Diagnosis not present

## 2020-06-10 DIAGNOSIS — Z3A13 13 weeks gestation of pregnancy: Secondary | ICD-10-CM | POA: Diagnosis not present

## 2020-06-17 DIAGNOSIS — Z3A14 14 weeks gestation of pregnancy: Secondary | ICD-10-CM | POA: Diagnosis not present

## 2020-06-17 DIAGNOSIS — O09522 Supervision of elderly multigravida, second trimester: Secondary | ICD-10-CM | POA: Diagnosis not present

## 2020-06-19 LAB — CHG ALPHA-FETOPROTEIN SERUM: AFP, Serum: NEGATIVE

## 2020-06-29 DIAGNOSIS — R7303 Prediabetes: Secondary | ICD-10-CM | POA: Diagnosis not present

## 2020-06-29 DIAGNOSIS — Z361 Encounter for antenatal screening for raised alphafetoprotein level: Secondary | ICD-10-CM | POA: Diagnosis not present

## 2020-06-29 LAB — GLUCOSE, 1 HOUR GESTATIONAL: Glucose 1 Hr Prenatal, POC: 92 mg/dL

## 2020-07-05 DIAGNOSIS — O4692 Antepartum hemorrhage, unspecified, second trimester: Secondary | ICD-10-CM | POA: Diagnosis not present

## 2020-07-05 DIAGNOSIS — O209 Hemorrhage in early pregnancy, unspecified: Secondary | ICD-10-CM | POA: Diagnosis not present

## 2020-07-05 DIAGNOSIS — N939 Abnormal uterine and vaginal bleeding, unspecified: Secondary | ICD-10-CM | POA: Diagnosis not present

## 2020-07-05 DIAGNOSIS — O26852 Spotting complicating pregnancy, second trimester: Secondary | ICD-10-CM | POA: Diagnosis not present

## 2020-07-05 DIAGNOSIS — O26892 Other specified pregnancy related conditions, second trimester: Secondary | ICD-10-CM | POA: Diagnosis not present

## 2020-07-05 DIAGNOSIS — Z3A18 18 weeks gestation of pregnancy: Secondary | ICD-10-CM | POA: Diagnosis not present

## 2020-07-05 DIAGNOSIS — Z3A17 17 weeks gestation of pregnancy: Secondary | ICD-10-CM | POA: Diagnosis not present

## 2020-07-09 ENCOUNTER — Other Ambulatory Visit: Payer: Self-pay | Admitting: Obstetrics and Gynecology

## 2020-07-09 ENCOUNTER — Encounter (HOSPITAL_COMMUNITY): Payer: Self-pay | Admitting: Obstetrics and Gynecology

## 2020-07-09 ENCOUNTER — Other Ambulatory Visit: Payer: Self-pay

## 2020-07-09 ENCOUNTER — Inpatient Hospital Stay (HOSPITAL_COMMUNITY)
Admission: AD | Admit: 2020-07-09 | Discharge: 2020-07-09 | Disposition: A | Discharge status: Home / Self Care | Payer: BC Managed Care – PPO | Attending: Obstetrics and Gynecology | Admitting: Obstetrics and Gynecology

## 2020-07-09 ENCOUNTER — Inpatient Hospital Stay (HOSPITAL_BASED_OUTPATIENT_CLINIC_OR_DEPARTMENT_OTHER): Payer: BC Managed Care – PPO

## 2020-07-09 DIAGNOSIS — O26892 Other specified pregnancy related conditions, second trimester: Secondary | ICD-10-CM | POA: Insufficient documentation

## 2020-07-09 DIAGNOSIS — K219 Gastro-esophageal reflux disease without esophagitis: Secondary | ICD-10-CM | POA: Diagnosis not present

## 2020-07-09 DIAGNOSIS — Z3A17 17 weeks gestation of pregnancy: Secondary | ICD-10-CM | POA: Diagnosis not present

## 2020-07-09 DIAGNOSIS — O26852 Spotting complicating pregnancy, second trimester: Secondary | ICD-10-CM | POA: Insufficient documentation

## 2020-07-09 DIAGNOSIS — Z87891 Personal history of nicotine dependence: Secondary | ICD-10-CM | POA: Insufficient documentation

## 2020-07-09 DIAGNOSIS — F329 Major depressive disorder, single episode, unspecified: Secondary | ICD-10-CM | POA: Insufficient documentation

## 2020-07-09 DIAGNOSIS — B9689 Other specified bacterial agents as the cause of diseases classified elsewhere: Secondary | ICD-10-CM | POA: Insufficient documentation

## 2020-07-09 DIAGNOSIS — R3989 Other symptoms and signs involving the genitourinary system: Secondary | ICD-10-CM

## 2020-07-09 DIAGNOSIS — O99342 Other mental disorders complicating pregnancy, second trimester: Secondary | ICD-10-CM | POA: Diagnosis not present

## 2020-07-09 DIAGNOSIS — O23592 Infection of other part of genital tract in pregnancy, second trimester: Secondary | ICD-10-CM | POA: Insufficient documentation

## 2020-07-09 DIAGNOSIS — R109 Unspecified abdominal pain: Secondary | ICD-10-CM | POA: Insufficient documentation

## 2020-07-09 DIAGNOSIS — F419 Anxiety disorder, unspecified: Secondary | ICD-10-CM | POA: Diagnosis not present

## 2020-07-09 DIAGNOSIS — O99212 Obesity complicating pregnancy, second trimester: Secondary | ICD-10-CM

## 2020-07-09 DIAGNOSIS — O99612 Diseases of the digestive system complicating pregnancy, second trimester: Secondary | ICD-10-CM | POA: Insufficient documentation

## 2020-07-09 DIAGNOSIS — O4692 Antepartum hemorrhage, unspecified, second trimester: Secondary | ICD-10-CM | POA: Diagnosis not present

## 2020-07-09 DIAGNOSIS — Z79899 Other long term (current) drug therapy: Secondary | ICD-10-CM | POA: Diagnosis not present

## 2020-07-09 DIAGNOSIS — O26899 Other specified pregnancy related conditions, unspecified trimester: Secondary | ICD-10-CM

## 2020-07-09 LAB — URINALYSIS, ROUTINE W REFLEX MICROSCOPIC
Bilirubin Urine: NEGATIVE
Glucose, UA: NEGATIVE mg/dL
Ketones, ur: NEGATIVE mg/dL
Nitrite: NEGATIVE
Protein, ur: NEGATIVE mg/dL
Specific Gravity, Urine: 1.004 — ABNORMAL LOW (ref 1.005–1.030)
pH: 7 (ref 5.0–8.0)

## 2020-07-09 LAB — WET PREP, GENITAL
Sperm: NONE SEEN
Trich, Wet Prep: NONE SEEN
Yeast Wet Prep HPF POC: NONE SEEN

## 2020-07-09 MED ORDER — CEFADROXIL 500 MG PO CAPS: 500.0000 mg | ORAL_CAPSULE | Freq: Two times a day (BID) | ORAL | 0 refills | Status: AC

## 2020-07-09 MED ORDER — METRONIDAZOLE 500 MG PO TABS: 500.0000 mg | ORAL_TABLET | Freq: Two times a day (BID) | ORAL | 0 refills | Status: DC

## 2020-07-09 MED ORDER — METRONIDAZOLE 0.75 % VA GEL: 1.0000 | VAGINAL | 0 refills | Status: DC

## 2020-07-09 NOTE — MAU Provider Note (Signed)
History     CSN: [id number]  Arrival date and time: 07/09/20 1256   First Provider Initiated Contact with Patient 07/09/20 1347      Chief Complaint  Patient presents with  . Pressure  . Spotting   Ms. Jessica Banks is a 39 y.o. year old G27P1011 female at [redacted]w[redacted]d weeks gestation who presents to MAU reporting pink spotting with wiping x1 week.  She also reports increased pelvic pressure that is oftentimes painful with movement.  She reports the same pressure and pink spotting earlier last week where she had a limited ultrasound at her job that was normal.  She has had no complications such as this in her first pregnancy.  She last sexual intercourse on Saturday, July 03, 2020. She also has a complaint of possible hemorrhoid or some type of rectal skin tag.  She complains that the rectal hemorrhoid or skin tag is painful at times.  She is a patient of [facility]; where was a primary patient of Dr. Garwin Brothers.  She is scheduled to have her anatomy ultrasound Thursday July 15, 2020.     OB History    Gravida  3   Para  1   Term  1   Preterm      AB  1   Living  1     SAB  1   TAB      Ectopic      Multiple  0   Live Births  1           Past Medical History:  Diagnosis Date  . Abnormal Pap smear    CIN I  . Anemia   . Anxiety   . Depression    off meds now, ok now  . Enlarged thyroid gland    nromal function  . GERD (gastroesophageal reflux disease)   . Migraine   . Obesity   . Ovarian cyst   . Vaginal Pap smear, abnormal    ok now    Past Surgical History:  Procedure Laterality Date  . LAPAROSCOPIC GASTRIC SLEEVE RESECTION      Family History  Problem Relation Age of Onset  . Thyroid disease Mother        s/p thyroidectomy  . Arthritis Mother   . Leukemia Maternal Grandmother   . Stroke Maternal Grandmother   . Cancer Maternal Grandfather        lung  . Heart disease Paternal Grandmother   . COPD Paternal Grandfather     Social  History   Tobacco Use  . Smoking status: Former Smoker    Years: 4.00    Types: Cigarettes    Quit date: 06/07/2013    Years since quitting: 7.0  . Smokeless tobacco: Never Used  . Tobacco comment: only smoked on the weekends  Vaping Use  . Vaping Use: Never used  Substance Use Topics  . Alcohol use: Yes    Comment: SOCIAL   . Drug use: No    Allergies:  Allergies  Allergen Reactions  . Novocain [Procaine] Anaphylaxis    Medications Prior to Admission  Medication Sig Dispense Refill Last Dose  . dexlansoprazole (DEXILANT) 60 MG capsule Take 1 capsule (60 mg total) by mouth daily. 30 capsule 3 07/08/2020 at 2100  . docusate sodium (COLACE) 100 MG capsule Take 100 mg by mouth daily.   07/08/2020 at 2100  . polyethylene glycol (MIRALAX / GLYCOLAX) 17 g packet Take 17 g by mouth daily.   07/09/2020 at  0800  . Prenatal Vit-Fe Fumarate-FA (MULTIVITAMIN-PRENATAL) 27-0.8 MG TABS tablet Take 1 tablet by mouth daily at 12 noon.   07/08/2020 at 2100  . Rimegepant Sulfate (NURTEC) 75 MG TBDP Take 75 mg by mouth daily as needed (migraine). 8 tablet 1   . AIMOVIG 70 MG/ML SOAJ Inject 70 mg into the skin every 30 (thirty) days. 1 pen 11   . ALPRAZolam (XANAX) 0.25 MG tablet TAKE 1 TABLET BY MOUTH EVERY DAY AS NEEDED 30 tablet 0   . cyclobenzaprine (FLEXERIL) 10 MG tablet Take 1 tablet (10 mg total) by mouth 3 (three) times daily as needed for muscle spasms. 30 tablet 3   . escitalopram (LEXAPRO) 20 MG tablet TAKE 1 TABLET BY MOUTH EVERY DAY 90 tablet 2   . Ferrous Sulfate (IRON PO) Take by mouth. 22 mg     . Magnesium 400 MG TABS Take by mouth daily.     . Multiple Vitamins-Minerals (HAIR SKIN AND NAILS FORMULA) TABS Take 1 tablet by mouth at bedtime.     . pantoprazole (PROTONIX) 40 MG tablet Take 40 mg by mouth at bedtime.   4   . temazepam (RESTORIL) 15 MG capsule Take 1 capsule (15 mg total) by mouth at bedtime as needed for sleep. 30 capsule 2   . Vitamin D, Ergocalciferol, (DRISDOL) 1.25 MG  (50000 UNIT) CAPS capsule One tab po twice weekly on Tues/Friday 24 capsule 0   . vitamin E (VITAMIN E) 180 MG (400 UNITS) capsule Take 400 Units by mouth daily.       Review of Systems  Constitutional: Negative.   HENT: Negative.   Eyes: Negative.   Respiratory: Negative.   Cardiovascular: Negative.   Gastrointestinal: Positive for rectal pain ("possible hemorrhoid or skin tag").  Endocrine: Negative.   Genitourinary: Positive for pelvic pain (increased pressure and pain) and vaginal bleeding (pink spotting x 1 week).  Musculoskeletal: Negative.   Skin: Negative.   Allergic/Immunologic: Negative.   Neurological: Negative.   Hematological: Negative.   Psychiatric/Behavioral: Negative.    Physical Exam   Blood pressure 124/76, pulse 91, temperature 98.1 F (36.7 C), temperature source Oral, resp. rate 20, last menstrual period 02/08/2020, SpO2 100 %, unknown if currently breastfeeding.  Physical Exam Vitals and nursing note reviewed. Exam conducted with a chaperone present.  Constitutional:      Appearance: Normal appearance. She is obese.  HENT:     Head: Normocephalic and atraumatic.  Eyes:     Pupils: Pupils are equal, round, and reactive to light.  Cardiovascular:     Rate and Rhythm: Normal rate and regular rhythm.  Pulmonary:     Effort: Pulmonary effort is normal.  Abdominal:     Comments: gravid  Genitourinary:    General: Normal vulva.     Comments: Uterus: gravid, S=D, SE: cervix is smooth, pink, no lesions, moderate amt of thick, yellowish, mildly malodorus vaginal d/c -- WP done, closed/long/firm, no CMT, (+) friability after insertion of speculum, no adnexal tenderness  Rectal skin tag noted at 7 o'clock position Musculoskeletal:        General: Normal range of motion.     Cervical back: Normal range of motion.  Skin:    General: Skin is warm and dry.  Neurological:     Mental Status: She is alert and oriented to person, place, and time.  Psychiatric:          Mood and Affect: Mood normal.        Behavior: Behavior  normal.        Thought Content: Thought content normal.        Judgment: Judgment normal.     MAU Course  Procedures  MDM CCUA UCx Wet Prep  Results for orders placed or performed during the hospital encounter of 07/09/20 (from the past 24 hour(s))  Urinalysis, Routine w reflex microscopic     Status: Abnormal   Collection Time: 07/09/20  1:21 PM  Result Value Ref Range   Color, Urine STRAW (A) YELLOW   APPearance CLEAR CLEAR   Specific Gravity, Urine 1.004 (L) 1.005 - 1.030   pH 7.0 5.0 - 8.0   Glucose, UA NEGATIVE NEGATIVE mg/dL   Hgb urine dipstick MODERATE (A) NEGATIVE   Bilirubin Urine NEGATIVE NEGATIVE   Ketones, ur NEGATIVE NEGATIVE mg/dL   Protein, ur NEGATIVE NEGATIVE mg/dL   Nitrite NEGATIVE NEGATIVE   Leukocytes,Ua LARGE (A) NEGATIVE   WBC, UA 0-5 0 - 5 WBC/hpf   Bacteria, UA RARE (A) NONE SEEN   Squamous Epithelial / LPF 0-5 0 - 5  Wet prep, genital     Status: Abnormal   Collection Time: 07/09/20  2:02 PM   Specimen: Cervix  Result Value Ref Range   Yeast Wet Prep HPF POC NONE SEEN NONE SEEN   Trich, Wet Prep NONE SEEN NONE SEEN   Clue Cells Wet Prep HPF POC PRESENT (A) NONE SEEN   WBC, Wet Prep HPF POC MANY (A) NONE SEEN   Sperm NONE SEEN          Assessment and Plan  1. Bacterial vaginosis - Information provided on BV - Rx for Flagyl 500 mg BID x 7 days - Decision made post discharge home to change Rx to Metrogel - patient advised via phone message   2. Abdominal cramping affecting pregnancy - Information provided on abdominal pain in pregnancy - Advised BV is quite possibly the cause of her abdominal cramping  3. Spotting affecting pregnancy in second trimester - Information provided on VB during pregnancy, 2nd trimester - Advised pelvic rest until spotting is resolved x 3 days  4. Suspected UTI - Rx for Cefadroxil 500 mg BID x 10 days - Patient advised of Rx for suspected UTI  and pending UCx via phone message  - Discharge home - Keep scheduled appointment with [facility] on Thursday 07/15/2020 - Patient verbalized an understanding of the plan of care and agrees.   Laury Deep, MSN, CNM 07/09/2020, 2:00 PM

## 2020-07-09 NOTE — MAU Note (Signed)
Presents with c/o spotting with wiping x1 week.  Also no having pelvic pressure.  States no blood noted on panties or panty liner.

## 2020-07-11 LAB — CULTURE, OB URINE
Culture: 40000 — AB
Special Requests: NORMAL

## 2020-07-15 DIAGNOSIS — O26892 Other specified pregnancy related conditions, second trimester: Secondary | ICD-10-CM | POA: Diagnosis not present

## 2020-07-15 DIAGNOSIS — O283 Abnormal ultrasonic finding on antenatal screening of mother: Secondary | ICD-10-CM | POA: Diagnosis not present

## 2020-07-15 DIAGNOSIS — O09522 Supervision of elderly multigravida, second trimester: Secondary | ICD-10-CM | POA: Diagnosis not present

## 2020-07-15 DIAGNOSIS — O3412 Maternal care for benign tumor of corpus uteri, second trimester: Secondary | ICD-10-CM | POA: Diagnosis not present

## 2020-07-20 DIAGNOSIS — O4692 Antepartum hemorrhage, unspecified, second trimester: Secondary | ICD-10-CM | POA: Diagnosis not present

## 2020-07-20 DIAGNOSIS — O09522 Supervision of elderly multigravida, second trimester: Secondary | ICD-10-CM | POA: Diagnosis not present

## 2020-07-20 DIAGNOSIS — Z3A19 19 weeks gestation of pregnancy: Secondary | ICD-10-CM | POA: Diagnosis not present

## 2020-07-30 DIAGNOSIS — F4320 Adjustment disorder, unspecified: Secondary | ICD-10-CM | POA: Diagnosis not present

## 2020-07-30 DIAGNOSIS — R319 Hematuria, unspecified: Secondary | ICD-10-CM | POA: Diagnosis not present

## 2020-07-30 DIAGNOSIS — O09522 Supervision of elderly multigravida, second trimester: Secondary | ICD-10-CM | POA: Diagnosis not present

## 2020-07-30 DIAGNOSIS — Z3689 Encounter for other specified antenatal screening: Secondary | ICD-10-CM | POA: Diagnosis not present

## 2020-07-30 DIAGNOSIS — Z3A20 20 weeks gestation of pregnancy: Secondary | ICD-10-CM | POA: Diagnosis not present

## 2020-08-06 ENCOUNTER — Other Ambulatory Visit: Payer: Self-pay | Admitting: Internal Medicine

## 2020-08-13 DIAGNOSIS — F4320 Adjustment disorder, unspecified: Secondary | ICD-10-CM | POA: Diagnosis not present

## 2020-08-17 ENCOUNTER — Encounter: Payer: Self-pay | Admitting: General Practice

## 2020-08-27 DIAGNOSIS — F4320 Adjustment disorder, unspecified: Secondary | ICD-10-CM | POA: Diagnosis not present

## 2020-08-30 ENCOUNTER — Other Ambulatory Visit: Payer: Self-pay | Admitting: *Deleted

## 2020-08-30 ENCOUNTER — Encounter: Payer: Self-pay | Admitting: Obstetrics and Gynecology

## 2020-08-30 DIAGNOSIS — Z3480 Encounter for supervision of other normal pregnancy, unspecified trimester: Secondary | ICD-10-CM | POA: Insufficient documentation

## 2020-08-30 DIAGNOSIS — O09529 Supervision of elderly multigravida, unspecified trimester: Secondary | ICD-10-CM | POA: Insufficient documentation

## 2020-09-01 DIAGNOSIS — O99210 Obesity complicating pregnancy, unspecified trimester: Secondary | ICD-10-CM | POA: Insufficient documentation

## 2020-09-02 ENCOUNTER — Ambulatory Visit: Payer: BC Managed Care – PPO | Admitting: Internal Medicine

## 2020-09-02 ENCOUNTER — Other Ambulatory Visit: Payer: Self-pay

## 2020-09-02 ENCOUNTER — Encounter: Payer: Self-pay | Admitting: Internal Medicine

## 2020-09-02 ENCOUNTER — Ambulatory Visit (INDEPENDENT_AMBULATORY_CARE_PROVIDER_SITE_OTHER): Payer: BC Managed Care – PPO | Admitting: Obstetrics and Gynecology

## 2020-09-02 ENCOUNTER — Encounter: Payer: Self-pay | Admitting: Obstetrics and Gynecology

## 2020-09-02 VITALS — BP 118/78 | HR 93 | Temp 98.0°F | Wt 255.0 lb

## 2020-09-02 VITALS — BP 134/72 | HR 82 | Temp 97.9°F | Ht 60.8 in | Wt 254.2 lb

## 2020-09-02 DIAGNOSIS — F419 Anxiety disorder, unspecified: Secondary | ICD-10-CM

## 2020-09-02 DIAGNOSIS — Z3A26 26 weeks gestation of pregnancy: Secondary | ICD-10-CM

## 2020-09-02 DIAGNOSIS — O09529 Supervision of elderly multigravida, unspecified trimester: Secondary | ICD-10-CM

## 2020-09-02 DIAGNOSIS — Z6841 Body Mass Index (BMI) 40.0 and over, adult: Secondary | ICD-10-CM | POA: Diagnosis not present

## 2020-09-02 DIAGNOSIS — Z3480 Encounter for supervision of other normal pregnancy, unspecified trimester: Secondary | ICD-10-CM

## 2020-09-02 DIAGNOSIS — O99210 Obesity complicating pregnancy, unspecified trimester: Secondary | ICD-10-CM

## 2020-09-02 NOTE — Progress Notes (Addendum)
I,Tianna Badgett,acting as a Education administrator for [name], MD.,have documented all relevant documentation on the behalf of [name], MD,as directed by  [name], MD while in the presence of [name], MD.  This visit occurred during the SARS-CoV-2 public health emergency.  Safety protocols were in place, including screening questions prior to the visit, additional usage of staff PPE, and extensive cleaning of exam room while observing appropriate contact time as indicated for disinfecting solutions.  Subjective:     Patient ID: Jessica Banks , female    DOB: February 28, 1981 , 39 y.o.   MRN: [id number]   Chief Complaint  Patient presents with  . Anxiety    HPI  She is here today for f/u anxiety.  She she has since stopped anxiety medication due to pregnancy.  She is followed by Hester Mates, midwife at [facility] for [employer]. She is due Dec 2021.   Anxiety Presents for follow-up visit. Patient reports no chest pain or feeling of choking. Symptoms occur occasionally. The quality of sleep is fair. Nighttime awakenings: none.   Compliance with medications is 76-100%.     Past Medical History:  Diagnosis Date  . Abnormal Pap smear    CIN I  . Anemia   . Anxiety   . Depression    off meds now, ok now  . Enlarged thyroid gland    nromal function  . GERD (gastroesophageal reflux disease)   . Migraine   . Obesity   . Ovarian cyst   . Vaginal Pap smear, abnormal    ok now     Family History  Problem Relation Age of Onset  . Thyroid disease Mother        s/p thyroidectomy  . Arthritis Mother   . Leukemia Maternal Grandmother   . Stroke Maternal Grandmother   . Cancer Maternal Grandfather        lung  . Heart disease Paternal Grandmother   . COPD Paternal Grandfather      Current Outpatient Medications:  .  butalbital-acetaminophen-caffeine (FIORICET) 50-325-40 MG tablet, Take 1-2 tablets by mouth every 4 (four) hours as needed., Disp:  , Rfl:  .  cetirizine (ZYRTEC ALLERGY) 10 MG tablet, , Disp: , Rfl:  .  DEXILANT 60 MG capsule, TAKE 1 CAPSULE BY MOUTH EVERY DAY, Disp: 30 capsule, Rfl: 3 .  docusate sodium (COLACE) 100 MG capsule, Take 100 mg by mouth daily., Disp: , Rfl:  .  doxylamine, Sleep, (UNISOM) 25 MG tablet, Take 25 mg by mouth at bedtime as needed., Disp: , Rfl:  .  Prenatal Vit-Fe Fumarate-FA (MULTIVITAMIN-PRENATAL) 27-0.8 MG TABS tablet, Take 1 tablet by mouth daily at 12 noon., Disp: , Rfl:    Allergies  Allergen Reactions  . Novocain [Procaine] Anaphylaxis     Review of Systems  Constitutional: Negative.   Respiratory: Negative.   Cardiovascular: Negative.  Negative for chest pain.  Gastrointestinal: Negative.   Neurological: Negative.      Today's Vitals   09/02/20 1008  BP: 134/72  Pulse: 82  Temp: 97.9 F (36.6 C)  TempSrc: Oral  Weight: 254 lb 3.2 oz (115.3 kg)  Height: 5' 0.8" (1.544 m)   Body mass index is 48.35 kg/m.   Objective:  Physical Exam Vitals and nursing note reviewed.  Constitutional:      Appearance: Normal appearance.  HENT:     Head: Normocephalic and atraumatic.  Cardiovascular:     Rate and Rhythm: Normal rate and  regular rhythm.     Heart sounds: Normal heart sounds.  Pulmonary:     Effort: Pulmonary effort is normal.     Breath sounds: Normal breath sounds.  Abdominal:     Comments: Gravid uterus  Skin:    General: Skin is warm.  Neurological:     General: No focal deficit present.     Mental Status: She is alert.  Psychiatric:        Mood and Affect: Mood normal.        Behavior: Behavior normal.         Assessment And Plan:     1. Anxiety Comments: Chronic. Discontinued medication due to pregnancy. Will defer treatment to OBGYN should her symptoms worsen.  2. Class 3 severe obesity due to excess calories with serious comorbidity and body mass index (BMI) of 45.0 to 49.9 in adult [facility]) Comments: Encouraged to continue with regular exercise  during her pregnancy.   3. [redacted] weeks gestation of pregnancy As per OB/GYN. She reports compliance with prenatal vitamins. Also compliant with prenatal care.    Patient was given opportunity to ask questions. Patient verbalized understanding of the plan and was able to repeat key elements of the plan. All questions were answered to their satisfaction.  [name], MD   I, [name], MD, have reviewed all documentation for this visit. The documentation on 09/13/20 for the exam, diagnosis, procedures, and orders are all accurate and complete.  THE PATIENT IS ENCOURAGED TO PRACTICE SOCIAL DISTANCING DUE TO THE COVID-19 PANDEMIC.

## 2020-09-02 NOTE — Patient Instructions (Addendum)
°  CONGRATULATIONS!!!!!!!!!!!!!!!!!!!!!!!!!!!!!!!!!!!!

## 2020-09-02 NOTE — Progress Notes (Signed)
INITIAL OBSTETRICAL VISIT Patient name: Jessica Banks MRN [id number]  Date of birth: 12-08-1981 Chief Complaint:   Initial Prenatal Visit  History of Present Illness:   Jessica Banks is a 39 y.o. G56P1011 African American female at [redacted]w[redacted]d by early U/S with an Estimated Date of Delivery: 12/12/20 being seen today for her initial obstetrical visit.  Her obstetrical history is significant for advanced maternal age, obesity and history of first trimester SAB in 70. This is a planned pregnancy. She and her husband (FOB) "Ollen Gross" live together with her older son, Sydnee Cabal. She has a support system that consists of her husband/family/friends. Today she reports fatigue. She wants to get the COVID vaccine booster since she received hers the end of January and beginning of February.  Patient's last menstrual period was 02/08/2020. Last pap 05/2020. Results were: normal Review of Systems:   Pertinent items are noted in HPI Denies cramping/contractions, leakage of fluid, vaginal bleeding, abnormal vaginal discharge w/ itching/odor/irritation, headaches, visual changes, shortness of breath, chest pain, abdominal pain, severe nausea/vomiting, or problems with urination or bowel movements unless otherwise stated above.  Pertinent History Reviewed:  Reviewed past medical,surgical, social, obstetrical and family history.  Reviewed problem list, medications and allergies. OB History  Gravida Para Term Preterm AB Living  3 1 1   1 1   SAB TAB Ectopic Multiple Live Births  1     0 1    # Outcome Date GA Lbr Len/2nd Weight Sex Delivery Anes PTL Lv  3 Current           2 Term 10/17/17 [redacted]w[redacted]d 06:54 / 01:08 6 lb 15.1 oz (3.15 kg) M Vag-Spont EPI, Local  LIV     Birth Comments: WNL   1 SAB 2015           Physical Assessment:   Vitals:   09/02/20 1331  BP: 118/78  Pulse: 93  Temp: 98 F (36.7 C)  Weight: 255 lb (115.7 kg)  Body mass index is 48.5 kg/m.       Physical Examination:  General  appearance - well appearing, and in no distress  Mental status - alert, oriented to person, place, and time  Psych:  She has a normal mood and affect  Skin - warm and dry, normal color, no suspicious lesions noted  Chest - effort normal, all lung fields clear to auscultation bilaterally  Heart - normal rate and regular rhythm  Abdomen - soft, nontender  Extremities:  No swelling or varicosities noted  Pelvic - not indicated   FHTs by doppler: 146 bpm  Assessment & Plan:  1) Low-Risk Pregnancy G3P1011 at [redacted]w[redacted]d with an Estimated Date of Delivery: 12/12/20   2) Initial OB visit - Welcomed to practice and introduced self to patient in addition to discussing other advanced practice providers that she may be seeing at this practice - Congratulated patient - Anticipatory guidance on upcoming appointments - Educated on [place] and pregnancy and the integration of virtual appointments  - Educated on babyscripts app- patient reports she has not received email, encouraged to look in spam folder and to call office if she still has not received email - patient verbalizes understanding    3) Supervision of other normal pregnancy, antepartum - Anticipatory guidance for 2 hr GTT in 3 wks; need to be fasting after midnight  2. Antepartum multigravida of advanced maternal age - Will be 39 yo at time of delivery  3. Obesity affecting pregnancy, antepartum - Growth [place] MFM  OB DETAIL +14 WK; Future    Meds: No orders of the defined types were placed in this encounter.   Initial labs reviewed. Prenatal records from [facility] reviewed. Continue prenatal vitamins Reviewed n/v relief measures and warning s/s to report Reviewed recommended weight gain based on pre-gravid BMI Encouraged well-balanced diet Genetic Screening discussed: results reviewed Cystic fibrosis, SMA, Fragile X screening discussed results reviewed The nature of [place] with multiple MDs and other  Advanced Practice Providers was explained to patient; also emphasized that residents, students are part of our team.  Discussed optimized OB schedule and video visits. Advised can have an in-office visit whenever she feels she needs to be seen.  Does have own BP cuff and scale. Check BP weekly, let us know if >140/90. Advised to call during normal business hours and there is an after-hours nurse line available.    Follow-up: Return in about 3 weeks (around 09/23/2020) for Return OB 2hr GTT.   Orders Placed This Encounter  Procedures   [place] MFM OB DETAIL +14 WK   Glucose, 1 hour gestational   OB RESULTS CONSOLE GC/Chlamydia   OB RESULTS CONSOLE RPR   OB RESULTS CONSOLE HIV antibody   OB RESULTS CONSOLE Rubella Antibody   OB RESULTS CONSOLE Hepatitis B surface antigen   OB RESULTS CONSOLE Hemoglobin and hematocrit, blood   OB RESULTS CONSOLE PLATELET COUNT   CHG ALPHA-FETOPROTEIN, SERUM   OB RESULTS CONSOLE ABO/Rh   OB RESULTS CONSOLE Antibody Screen    Laury Deep MSN, CNM 09/02/2020

## 2020-09-08 DIAGNOSIS — F4320 Adjustment disorder, unspecified: Secondary | ICD-10-CM | POA: Diagnosis not present

## 2020-09-09 ENCOUNTER — Encounter: Payer: Self-pay | Admitting: Internal Medicine

## 2020-09-16 ENCOUNTER — Other Ambulatory Visit: Payer: Self-pay

## 2020-09-16 ENCOUNTER — Ambulatory Visit: Payer: BC Managed Care – PPO

## 2020-09-16 ENCOUNTER — Other Ambulatory Visit: Payer: Self-pay | Admitting: *Deleted

## 2020-09-16 ENCOUNTER — Ambulatory Visit: Payer: BC Managed Care – PPO | Attending: Obstetrics and Gynecology

## 2020-09-16 DIAGNOSIS — O99212 Obesity complicating pregnancy, second trimester: Secondary | ICD-10-CM

## 2020-09-16 DIAGNOSIS — O99210 Obesity complicating pregnancy, unspecified trimester: Secondary | ICD-10-CM | POA: Diagnosis not present

## 2020-09-16 DIAGNOSIS — O43192 Other malformation of placenta, second trimester: Secondary | ICD-10-CM

## 2020-09-16 DIAGNOSIS — Z363 Encounter for antenatal screening for malformations: Secondary | ICD-10-CM

## 2020-09-16 DIAGNOSIS — O358XX0 Maternal care for other (suspected) fetal abnormality and damage, not applicable or unspecified: Secondary | ICD-10-CM

## 2020-09-16 DIAGNOSIS — O43199 Other malformation of placenta, unspecified trimester: Secondary | ICD-10-CM

## 2020-09-16 DIAGNOSIS — Z3A27 27 weeks gestation of pregnancy: Secondary | ICD-10-CM

## 2020-09-16 DIAGNOSIS — O09522 Supervision of elderly multigravida, second trimester: Secondary | ICD-10-CM

## 2020-09-23 ENCOUNTER — Ambulatory Visit (INDEPENDENT_AMBULATORY_CARE_PROVIDER_SITE_OTHER): Payer: BC Managed Care – PPO | Admitting: Obstetrics and Gynecology

## 2020-09-23 ENCOUNTER — Other Ambulatory Visit: Payer: Self-pay

## 2020-09-23 VITALS — BP 109/73 | HR 92 | Temp 98.3°F | Wt 251.0 lb

## 2020-09-23 DIAGNOSIS — Z3480 Encounter for supervision of other normal pregnancy, unspecified trimester: Secondary | ICD-10-CM | POA: Diagnosis not present

## 2020-09-23 DIAGNOSIS — O99210 Obesity complicating pregnancy, unspecified trimester: Secondary | ICD-10-CM

## 2020-09-23 DIAGNOSIS — O09529 Supervision of elderly multigravida, unspecified trimester: Secondary | ICD-10-CM

## 2020-09-23 DIAGNOSIS — O43193 Other malformation of placenta, third trimester: Secondary | ICD-10-CM

## 2020-09-23 DIAGNOSIS — Z23 Encounter for immunization: Secondary | ICD-10-CM

## 2020-09-23 DIAGNOSIS — Z3A28 28 weeks gestation of pregnancy: Secondary | ICD-10-CM

## 2020-09-23 NOTE — Patient Instructions (Signed)
Iron-Rich Diet  Iron is a mineral that helps your body to produce hemoglobin. Hemoglobin is a protein in red blood cells that carries oxygen to your body's tissues. Eating too little iron may cause you to feel weak and tired, and it can increase your risk of infection. Iron is naturally found in many foods, and many foods have iron added to them (iron-fortified foods). You may need to follow an iron-rich diet if you do not have enough iron in your body due to certain medical conditions. The amount of iron that you need each day depends on your age, your sex, and any medical conditions you have. Follow instructions from your health care provider or a diet and nutrition specialist (dietitian) about how much iron you should eat each day. What are tips for following this plan? Reading food labels  Check food labels to see how many milligrams (mg) of iron are in each serving. Cooking  Cook foods in pots and pans that are made from iron.  Take these steps to make it easier for your body to absorb iron from certain foods: ? Soak beans overnight before cooking. ? Soak whole grains overnight and drain them before using. ? Ferment flours before baking, such as by using yeast in bread dough. Meal planning  When you eat foods that contain iron, you should eat them with foods that are high in vitamin C. These include oranges, peppers, tomatoes, potatoes, and mango. Vitamin C helps your body to absorb iron. General information  Take iron supplements only as told by your health care provider. An overdose of iron can be life-threatening. If you were prescribed iron supplements, take them with orange juice or a vitamin C supplement.  When you eat iron-fortified foods or take an iron supplement, you should also eat foods that naturally contain iron, such as meat, poultry, and fish. Eating naturally iron-rich foods helps your body to absorb the iron that is added to other foods or contained in a  supplement.  Certain foods and drinks prevent your body from absorbing iron properly. Avoid eating these foods in the same meal as iron-rich foods or with iron supplements. These foods include: ? Coffee, black tea, and red wine. ? Milk, dairy products, and foods that are high in calcium. ? Beans and soybeans. ? Whole grains. What foods should I eat? Fruits Prunes. Raisins. Eat fruits high in vitamin C, such as oranges, grapefruits, and strawberries, alongside iron-rich foods. Vegetables Spinach (cooked). Green peas. Broccoli. Fermented vegetables. Eat vegetables high in vitamin C, such as leafy greens, potatoes, bell peppers, and tomatoes, alongside iron-rich foods. Grains Iron-fortified breakfast cereal. Iron-fortified whole-wheat bread. Enriched rice. Sprouted grains. Meats and other proteins Beef liver. Oysters. Beef. Shrimp. [place]. Chicken. [place]. Sardines. Chickpeas. Nuts. Tofu. Pumpkin seeds. Beverages Tomato juice. Fresh orange juice. Prune juice. Hibiscus tea. Fortified instant breakfast shakes. Sweets and desserts Blackstrap molasses. Seasonings and condiments Tahini. Fermented soy sauce. Other foods Wheat germ. The items listed above may not be a complete list of recommended foods and beverages. Contact a dietitian for more information. What foods should I avoid? Grains Whole grains. Bran cereal. Bran flour. Oats. Meats and other proteins Soybeans. Products made from soy protein. Black beans. Lentils. Mung beans. Split peas. Dairy Milk. Cream. Cheese. Yogurt. Cottage cheese. Beverages Coffee. Black tea. Red wine. Sweets and desserts Cocoa. Chocolate. Ice cream. Other foods Basil. Oregano. Large amounts of parsley. The items listed above may not be a complete list of foods and beverages to avoid.  Contact a dietitian for more information. Summary  Iron is a mineral that helps your body to produce hemoglobin. Hemoglobin is a protein in red blood cells that carries  oxygen to your body's tissues.  Iron is naturally found in many foods, and many foods have iron added to them (iron-fortified foods).  When you eat foods that contain iron, you should eat them with foods that are high in vitamin C. Vitamin C helps your body to absorb iron.  Certain foods and drinks prevent your body from absorbing iron properly, such as whole grains and dairy products. You should avoid eating these foods in the same meal as iron-rich foods or with iron supplements. This information is not intended to replace advice given to you by your health care provider. Make sure you discuss any questions you have with your health care provider. Document Revised: 11/16/2017 Document Reviewed: 10/30/2017 Elsevier Patient Education  2020 [place]. Fetal Movement Counts Patient Name: ________________________________________________ Patient Due Date: ____________________ What is a fetal movement count?  A fetal movement count is the number of times that you feel your baby move during a certain amount of time. This may also be called a fetal kick count. A fetal movement count is recommended for every pregnant woman. You may be asked to start counting fetal movements as early as week 28 of your pregnancy. Pay attention to when your baby is most active. You may notice your baby's sleep and wake cycles. You may also notice things that make your baby move more. You should do a fetal movement count:  When your baby is normally most active.  At the same time each day. A good time to count movements is while you are resting, after having something to eat and drink. How do I count fetal movements? 1. Find a quiet, comfortable area. Sit, or lie down on your side. 2. Write down the date, the start time and stop time, and the number of movements that you felt between those two times. Take this information with you to your health care visits. 3. Write down your start time when you feel the first  movement. 4. Count kicks, flutters, swishes, rolls, and jabs. You should feel at least 10 movements. 5. You may stop counting after you have felt 10 movements, or if you have been counting for 2 hours. Write down the stop time. 6. If you do not feel 10 movements in 2 hours, contact your health care provider for further instructions. Your health care provider may want to do additional tests to assess your baby's well-being. Contact a health care provider if:  You feel fewer than 10 movements in 2 hours.  Your baby is not moving like he or she usually does. Date: ____________ Start time: ____________ Stop time: ____________ Movements: ____________ Date: ____________ Start time: ____________ Stop time: ____________ Movements: ____________ Date: ____________ Start time: ____________ Stop time: ____________ Movements: ____________ Date: ____________ Start time: ____________ Stop time: ____________ Movements: ____________ Date: ____________ Start time: ____________ Stop time: ____________ Movements: ____________ Date: ____________ Start time: ____________ Stop time: ____________ Movements: ____________ Date: ____________ Start time: ____________ Stop time: ____________ Movements: ____________ Date: ____________ Start time: ____________ Stop time: ____________ Movements: ____________ Date: ____________ Start time: ____________ Stop time: ____________ Movements: ____________ This information is not intended to replace advice given to you by your health care provider. Make sure you discuss any questions you have with your health care provider. Document Revised: 07/24/2019 Document Reviewed: 07/24/2019 Elsevier Patient Education  2020 Elsevier  Inc. Ruptura prematura de [place] y ruptura prematura de membranas pretrmino Premature Rupture and Preterm Premature Rupture of Membranes  La ruptura de membranas es cuando las membranas (saco amnitico) que contienen al beb se abren. Esto se conoce  comnmente como "ruptura de bolsa". Si la bolsa de aguas se rompe antes del inicio del trabajo de parto (prematuramente), se llama ruptura prematura de membranas (RPM). Si esto ocurre antes de la semana 37, se denomina ruptura prematura de las [place] pretrmino (RPMP). Debido a que Paediatric nurse impide la infeccin y Musician otras funciones importantes, la ruptura del saco amnitico antes de las 37semanas de [place] provocar problemas graves. Requiere la atencin inmediata de un mdico. Cules son las causas? Cuando ocurre la RPMP a las 37semanas de Media planner o despus, la causa suele ser un debilitamiento natural de las membranas y la friccin producida por las contracciones. Habitualmente, la RPMP se debe a una infeccin. En muchos de [employer], se desconoce la causa. Qu incrementa el riesgo de RPMP? Los siguientes factores pueden hacerla ms propensa a sufrir una RPMP:  Infeccin.  Haber tenido una RPMP en un embarazo anterior.  Tener el cuello uterino corto.  Hemorragia durante el segundo o Oceanographer.  [facility]ajo [facility], que es un clculo de la Air traffic controller.  Fumar.  Consumir drogas.  Nivel socioeconmico bajo. Qu problemas pueden ser ocasionados por la RPM y la RPMP? Esta afeccin ocasiona peligros para la salud de la madre y del beb. Estos incluyen:  Dar a luz un beb prematuro.  Sufrir una infeccin grave en los tejidos de la placenta (corioamnionitis).  Desprendimiento temprano de la placenta en el tero (desprendimiento de la placenta).  Compresin del cordn umbilical.  Tener una infeccin grave despus del parto. Cules son los signos de la RPM y la RPMP? Los signos de esta afeccin incluyen lo siguiente:  Prdida repentina o lenta de lquido por la vagina.  Ropa interior mojada constantemente. En algunos casos, las mujeres confunden la prdida o goteo con orina, especialmente si la prdida es lenta y no un chorro de lquido. Si tiene una  prdida constante o si su ropa interior continua mojndose, es probable que haya roto las [place]. Qu debo hacer si creo que las [place] se han roto?  Llame a su mdico de inmediato.  Deber ir al hospital de inmediato para que un mdico la examine. Qu ocurre si me diagnostican una RPM o RPMP? [place] que llegue al hospital le harn [employer]. Le realizarn un examen del cuello del tero con un instrumento lubricado (espculo) para determinar si el cuello del tero se ha ablandado o ha comenzado a abrirse (dilatarse).  Si le han diagnosticado RPM, es posible que le induzcan el parto dentro de las 24horas, si no tiene contracciones.  Si le han diagnosticado una RPMP, y no tiene [place], es posible que le induzcan el parto segn en qu trimestre se encuentre. Si tiene una RPMP:  Se har un seguimiento exhaustivo de usted y su beb para detectar signos de infeccin u otras complicaciones.  Le administrarn lo siguiente: ? Un antibitico para disminuir las probabilidades de que desarrolle una infeccin. ? Un corticoesteroide para ayudar a [employer] del beb con mayor rapidez. ? Un medicamento para prevenir la parlisis cerebral en el beb. ? Un medicamento para detener el trabajo de parto prematuro.  Le pueden indicar hacer reposo en cama, en su casa o en el hospital.  Podrn inducirle el parto si  surgen complicaciones para usted o para el beb. El tratamiento depender de muchos factores, como en qu semana de gestacin (en qu etapa del [place]) se encuentra, el desarrollo del beb y otras complicaciones que puedan surgir. Esta informacin no tiene Marine scientist el consejo del mdico. Asegrese de hacerle al mdico cualquier pregunta que tenga. Document Revised: 10/04/2017 Document Reviewed: 07/10/2016 Elsevier Patient Education  [place]. Back Pain in Pregnancy Back pain during pregnancy is common. Back pain may be caused by several  factors that are related to changes during your pregnancy. Follow these instructions at home: Managing pain, stiffness, and swelling      If directed, for sudden (acute) back pain, put ice on the painful area. ? Put ice in a plastic bag. ? Place a towel between your skin and the bag. ? Leave the ice on for 20 minutes, 2-3 times per day.  If directed, apply heat to the affected area before you exercise. Use the heat source that your health care provider recommends, such as a moist heat pack or a heating pad. ? Place a towel between your skin and the heat source. ? Leave the heat on for 20-30 minutes. ? Remove the heat if your skin turns bright red. This is especially important if you are unable to feel pain, heat, or cold. You may have a greater risk of getting burned.  If directed, massage the affected area. Activity  Exercise as told by your health care provider. Gentle exercise is the best way to prevent or manage back pain.  Listen to your body when lifting. If lifting hurts, ask for help or bend your knees. This uses your leg muscles instead of your back muscles.  Squat down when picking up something from the floor. Do not bend over.  Only use bed rest for short periods as told by your health care provider. Bed rest should only be used for the most severe episodes of back pain. Standing, sitting, and lying down  Do not stand in one place for long periods of time.  Use good posture when sitting. Make sure your head rests over your shoulders and is not hanging forward. Use a pillow on your lower back if necessary.  Try sleeping on your side, preferably the left side, with a pregnancy support pillow or 1-2 regular pillows between your legs. ? If you have back pain after a night's rest, your bed may be too soft. ? A firm mattress may provide more support for your back during pregnancy. General instructions  Do not wear high heels.  Eat a healthy diet. Try to gain weight within  your health care provider's recommendations.  Use a maternity girdle, elastic sling, or back brace as told by your health care provider.  Take over-the-counter and prescription medicines only as told by your health care provider.  Work with a physical therapist or massage therapist to find ways to manage back pain. Acupuncture or massage therapy may be helpful.  Keep all follow-up visits as told by your health care provider. This is important. Contact a health care provider if:  Your back pain interferes with your daily activities.  You have increasing pain in other parts of your body. Get help right away if:  You develop numbness, tingling, weakness, or problems with the use of your arms or legs.  You develop severe back pain that is not controlled with medicine.  You have a change in bowel or bladder control.  You develop shortness of  breath, dizziness, or you faint.  You develop nausea, vomiting, or sweating.  You have back pain that is a rhythmic, cramping pain similar to labor pains. Labor pain is usually 1-2 minutes apart, lasts for about 1 minute, and involves a bearing down feeling or pressure in your pelvis.  You have back pain and your water breaks or you have vaginal bleeding.  You have back pain or numbness that travels down your leg.  Your back pain developed after you fell.  You develop pain on one side of your back.  You see blood in your urine.  You develop skin blisters in the area of your back pain. Summary  Back pain may be caused by several factors that are related to changes during your pregnancy.  Follow instructions as told by your health care provider for managing pain, stiffness, and swelling.  Exercise as told by your health care provider. Gentle exercise is the best way to prevent or manage back pain.  Take over-the-counter and prescription medicines only as told by your health care provider.  Keep all follow-up visits as told by your health  care provider. This is important. This information is not intended to replace advice given to you by your health care provider. Make sure you discuss any questions you have with your health care provider. Document Revised: 03/25/2019 Document Reviewed: 05/22/2018 Elsevier Patient Education  [place]. Sciatica  Sciatica is pain, numbness, weakness, or tingling along the path of the sciatic nerve. The sciatic nerve starts in the lower back and runs down the back of each leg. The nerve controls the muscles in the lower leg and in the back of the knee. It also provides feeling (sensation) to the back of the thigh, the lower leg, and the sole of the foot. Sciatica is a symptom of another medical condition that pinches or puts pressure on the sciatic nerve. Sciatica most often only affects one side of the body. Sciatica usually goes away on its own or with treatment. In some cases, sciatica may come back (recur). What are the causes? This condition is caused by pressure on the sciatic nerve or pinching of the nerve. This may be the result of:  A disk in between the bones of the spine bulging out too far (herniated disk).  Age-related changes in the spinal disks.  A pain disorder that affects a muscle in the buttock.  Extra bone growth near the sciatic nerve.  A break (fracture) of the pelvis.  Pregnancy.  Tumor. This is rare. What increases the risk? The following factors may make you more likely to develop this condition:  Playing sports that place pressure or stress on the spine.  Having poor strength and flexibility.  A history of back injury or surgery.  Sitting for long periods of time.  Doing activities that involve repetitive bending or lifting.  Obesity. What are the signs or symptoms? Symptoms can vary from mild to very severe, and they may include:  Any of these problems in the lower back, leg, hip, or buttock: ? Mild tingling, numbness, or dull  aches. ? Burning sensations. ? Sharp pains.  Numbness in the back of the calf or the sole of the foot.  Leg weakness.  Severe back pain that makes movement difficult. Symptoms may get worse when you cough, sneeze, or laugh, or when you sit or stand for long periods of time. How is this diagnosed? This condition may be diagnosed based on:  Your symptoms and medical  history.  A physical exam.  Blood tests.  Imaging tests, such as: ? X-rays. ? MRI. ? CT scan. How is this treated? In many cases, this condition improves on its own without treatment. However, treatment may include:  Reducing or modifying physical activity.  Exercising and stretching.  Icing and applying heat to the affected area.  Medicines that help to: ? Relieve pain and swelling. ? Relax your muscles.  Injections of medicines that help to relieve pain, irritation, and inflammation around the sciatic nerve (steroids).  Surgery. Follow these instructions at home: Medicines  Take over-the-counter and prescription medicines only as told by your health care provider.  Ask your health care provider if the medicine prescribed to you: ? Requires you to avoid driving or using heavy machinery. ? Can cause constipation. You may need to take these actions to prevent or treat constipation:  Drink enough fluid to keep your urine pale yellow.  Take over-the-counter or prescription medicines.  Eat foods that are high in fiber, such as beans, whole grains, and fresh fruits and vegetables.  Limit foods that are high in fat and processed sugars, such as fried or sweet foods. Managing pain      If directed, put ice on the affected area. ? Put ice in a plastic bag. ? Place a towel between your skin and the bag. ? Leave the ice on for 20 minutes, 2-3 times a day.  If directed, apply heat to the affected area. Use the heat source that your health care provider recommends, such as a moist heat pack or a heating  pad. ? Place a towel between your skin and the heat source. ? Leave the heat on for 20-30 minutes. ? Remove the heat if your skin turns bright red. This is especially important if you are unable to feel pain, heat, or cold. You may have a greater risk of getting burned. Activity   Return to your normal activities as told by your health care provider. Ask your health care provider what activities are safe for you.  Avoid activities that make your symptoms worse.  Take brief periods of rest throughout the day. ? When you rest for longer periods, mix in some mild activity or stretching between periods of rest. This will help to prevent stiffness and pain. ? Avoid sitting for long periods of time without moving. Get up and move around at least one time each hour.  Exercise and stretch regularly, as told by your health care provider.  Do not lift anything that is heavier than 10 lb (4.5 kg) while you have symptoms of sciatica. When you do not have symptoms, you should still avoid heavy lifting, especially repetitive heavy lifting.  When you lift objects, always use proper lifting technique, which includes: ? Bending your knees. ? Keeping the load close to your body. ? Avoiding twisting. General instructions  Maintain a healthy weight. Excess weight puts extra stress on your back.  Wear supportive, comfortable shoes. Avoid wearing high heels.  Avoid sleeping on a mattress that is too soft or too hard. A mattress that is firm enough to support your back when you sleep may help to reduce your pain.  Keep all follow-up visits as told by your health care provider. This is important. Contact a health care provider if:  You have pain that: ? Wakes you up when you are sleeping. ? Gets worse when you lie down. ? Is worse than you have experienced in the past. ? Lasts longer  than 4 weeks.  You have an unexplained weight loss. Get help right away if:  You are not able to control when you  urinate or have bowel movements (incontinence).  You have: ? Weakness in your lower back, pelvis, buttocks, or legs that gets worse. ? Redness or swelling of your back. ? A burning sensation when you urinate. Summary  Sciatica is pain, numbness, weakness, or tingling along the path of the sciatic nerve.  This condition is caused by pressure on the sciatic nerve or pinching of the nerve.  Sciatica can cause pain, numbness, or tingling in the lower back, legs, hips, and buttocks.  Treatment often includes rest, exercise, medicines, and applying ice or heat. This information is not intended to replace advice given to you by your health care provider. Make sure you discuss any questions you have with your health care provider. Document Revised: 12/23/2018 Document Reviewed: 12/23/2018 Elsevier Patient Education  [place].

## 2020-09-23 NOTE — Progress Notes (Signed)
   LOW-RISK PREGNANCY OFFICE VISIT Patient name: Jessica Banks MRN [id number]  Date of birth: 1980-12-21 Chief Complaint:   Routine Prenatal Visit  History of Present Illness:   Jessica Banks is a 39 y.o. G43P1011 female at [redacted]w[redacted]d with an Estimated Date of Delivery: 12/12/20 being seen today for ongoing management of a low-risk pregnancy.  Today she reports backache and sciatic pain and pelvic pressure. Contractions: Not present. Vag. Bleeding: None.  Movement: Present. denies leaking of fluid. Review of Systems:   Pertinent items are noted in HPI Denies abnormal vaginal discharge w/ itching/odor/irritation, headaches, visual changes, shortness of breath, chest pain, abdominal pain, severe nausea/vomiting, or problems with urination or bowel movements unless otherwise stated above. Pertinent History Reviewed:  Reviewed past medical,surgical, social, obstetrical and family history.  Reviewed problem list, medications and allergies. Physical Assessment:   Vitals:   09/23/20 0808  BP: 109/73  Pulse: 92  Temp: 98.3 F (36.8 C)  Weight: 251 lb (113.9 kg)  Body mass index is 47.74 kg/m.        Physical Examination:   General appearance: Well appearing, and in no distress  Mental status: Alert, oriented to person, place, and time  Skin: Warm & dry  Cardiovascular: Normal heart rate noted  Respiratory: Normal respiratory effort, no distress  Abdomen: Soft, gravid, nontender  Pelvic: Cervical exam deferred         Extremities: Edema: Trace  Fetal Status: Fetal Heart Rate (bpm): 140 Fundal Height: 30 cm Movement: Present Presentation: Undeterminable  No results found for this or any previous visit (from the past 24 hour(s)).  Assessment & Plan:  1) Low-risk pregnancy G3P1011 at [redacted]w[redacted]d with an Estimated Date of Delivery: 12/12/20   2) Supervision of other normal pregnancy, antepartum  - Glucose Tolerance, 2 Hours w/1 Hour,  - HIV Antibody (routine testing w rflx),  - RPR,    - CBC - Flu Vaccine - Desires to have TdaP at next visit - Ok to continue Flexeril or Fioricet for recurrent H/As  3) Antepartum multigravida of advanced maternal age  61) Obesity affecting pregnancy, antepartum  5) [redacted] weeks gestation of pregnancy  6) Marginal insertion of umbilical cord affecting management of mother in third trimester - Discussed marginal cord and impact on pregnancy - F/U U/S 10/19/2020   Meds: No orders of the defined types were placed in this encounter.  Labs/procedures today: 2 hr GTT, 3rd trimester labs and flu vaccine  Plan:  Continue routine obstetrical care   Reviewed: Preterm labor symptoms and general obstetric precautions including but not limited to vaginal bleeding, contractions, leaking of fluid and fetal movement were reviewed in detail with the patient.  All questions were answered.    Follow-up: Return in about 2 weeks (around 10/07/2020) for Return OB visit.  Orders Placed This Encounter  Procedures  . Flu Vaccine QUAD 36+ mos IM  . Glucose Tolerance, 2 Hours w/1 Hour  . HIV Antibody (routine testing w rflx)  . RPR  . CBC   Laury Deep MSN, CNM 09/23/2020 8:58 AM

## 2020-09-24 LAB — CBC
Hematocrit: 34.5 % (ref 34.0–46.6)
Hemoglobin: 11.1 g/dL (ref 11.1–15.9)
MCH: 27.7 pg (ref 26.6–33.0)
MCHC: 32.2 g/dL (ref 31.5–35.7)
MCV: 86 fL (ref 79–97)
Platelets: 284 10*3/uL (ref 150–450)
RBC: 4.01 x10E6/uL (ref 3.77–5.28)
RDW: 14.5 % (ref 11.7–15.4)
WBC: 7.4 10*3/uL (ref 3.4–10.8)

## 2020-09-24 LAB — RPR: RPR Ser Ql: NONREACTIVE

## 2020-09-24 LAB — GLUCOSE TOLERANCE, 2 HOURS W/ 1HR
Glucose, 1 hour: 151 mg/dL (ref 65–179)
Glucose, 2 hour: 114 mg/dL (ref 65–152)
Glucose, Fasting: 75 mg/dL (ref 65–91)

## 2020-09-24 LAB — HIV ANTIBODY (ROUTINE TESTING W REFLEX): HIV Screen 4th Generation wRfx: NONREACTIVE

## 2020-09-27 ENCOUNTER — Other Ambulatory Visit (INDEPENDENT_AMBULATORY_CARE_PROVIDER_SITE_OTHER): Payer: BC Managed Care – PPO | Admitting: Obstetrics and Gynecology

## 2020-09-27 DIAGNOSIS — G43009 Migraine without aura, not intractable, without status migrainosus: Secondary | ICD-10-CM

## 2020-09-27 MED ORDER — BUTALBITAL-APAP-CAFFEINE 50-325-40 MG PO TABS: 1.0000 | ORAL_TABLET | ORAL | 0 refills | Status: DC | PRN

## 2020-09-27 NOTE — Progress Notes (Signed)
TC from patient requesting refill on Fioricet. Rx sent and patient notified via My Chart.  Laury Deep, CNM

## 2020-09-29 DIAGNOSIS — F4320 Adjustment disorder, unspecified: Secondary | ICD-10-CM | POA: Diagnosis not present

## 2020-09-30 ENCOUNTER — Encounter: Payer: BC Managed Care – PPO | Admitting: Obstetrics and Gynecology

## 2020-10-07 ENCOUNTER — Encounter: Payer: BC Managed Care – PPO | Admitting: Obstetrics and Gynecology

## 2020-10-15 ENCOUNTER — Other Ambulatory Visit: Payer: Self-pay

## 2020-10-15 ENCOUNTER — Ambulatory Visit (INDEPENDENT_AMBULATORY_CARE_PROVIDER_SITE_OTHER): Payer: BC Managed Care – PPO | Admitting: Certified Nurse Midwife

## 2020-10-15 VITALS — BP 123/70 | HR 103 | Temp 98.0°F | Wt 253.6 lb

## 2020-10-15 DIAGNOSIS — Z3A31 31 weeks gestation of pregnancy: Secondary | ICD-10-CM

## 2020-10-15 DIAGNOSIS — R3915 Urgency of urination: Secondary | ICD-10-CM | POA: Diagnosis not present

## 2020-10-15 DIAGNOSIS — O0993 Supervision of high risk pregnancy, unspecified, third trimester: Secondary | ICD-10-CM

## 2020-10-15 NOTE — Progress Notes (Signed)
   PRENATAL VISIT NOTE  Subjective:  Jessica Banks is a 39 y.o. G3P1011 at [redacted]w[redacted]d being seen today for ongoing prenatal care.  She is currently monitored for the following issues for this high-risk pregnancy and has Anxiety; Migraine; Obesity; Near syncope; Supervision of other normal pregnancy, antepartum; Antepartum multigravida of advanced maternal age; and Obesity affecting pregnancy, antepartum on their problem list.  Patient reports increased urgency and 3 episodes of hypoglycemia, but otherwise doing well.  Contractions: Not present.  .  Movement: Present. Denies leaking of fluid.   The following portions of the patient's history were reviewed and updated as appropriate: allergies, current medications, past family history, past medical history, past social history, past surgical history and problem list.   Objective:   Vitals:   10/15/20 0853  BP: 123/70  Pulse: (!) 103  Temp: 98 F (36.7 C)  Weight: 253 lb 9.6 oz (115 kg)    Fetal Status: Fetal Heart Rate (bpm): 145 Fundal Height: 32 cm Movement: Present  Presentation: Vertex  General:  Alert, oriented and cooperative. Patient is in no acute distress.  Skin: Skin is warm and dry. No rash noted.   Cardiovascular: Normal heart rate noted  Respiratory: Normal respiratory effort, no problems with respiration noted  Abdomen: Soft, gravid, appropriate for gestational age.  Pain/Pressure: Present     Pelvic: Cervical exam deferred        Extremities: Normal range of motion.  Edema: Trace  Mental Status: Normal mood and affect. Normal behavior. Normal judgment and thought content.   Assessment and Plan:  Pregnancy: G3P1011 at [redacted]w[redacted]d 1. Urgency of urination - Culture, OB Urine  2. Supervision of high risk pregnancy in third trimester - Gave reassurance that hypoglycemia can be a normal finding in pregnancy, discussed dietary changes to prevent further occurrences  3. [redacted] weeks gestation of pregnancy - Pt requested  information about doula care, am recommending a husband/wife team (Jessica Banks & Jessica Banks) and will check on availability/provide contact information.  Preterm labor symptoms and general obstetric precautions including but not limited to vaginal bleeding, contractions, leaking of fluid and fetal movement were reviewed in detail with the patient. Please refer to After Visit Summary for other counseling recommendations.   Return in about 2 weeks (around 10/29/2020) for IN-PERSON, LOB.  Future Appointments  Date Time Provider [place]  10/19/2020  8:30 AM [facility] NURSE [facility]  10/19/2020  8:45 AM [facility] US4 [facility]  10/28/2020  8:00 AM Laury Deep, CNM [facility] None  03/03/2021  9:30 AM [place] Chard, MD [facility] None    Gabriel Carina, CNM

## 2020-10-19 ENCOUNTER — Ambulatory Visit: Payer: BC Managed Care – PPO | Admitting: *Deleted

## 2020-10-19 ENCOUNTER — Other Ambulatory Visit: Payer: Self-pay | Admitting: *Deleted

## 2020-10-19 ENCOUNTER — Other Ambulatory Visit: Payer: Self-pay

## 2020-10-19 ENCOUNTER — Ambulatory Visit: Payer: BC Managed Care – PPO | Attending: Obstetrics and Gynecology

## 2020-10-19 ENCOUNTER — Encounter: Payer: Self-pay | Admitting: *Deleted

## 2020-10-19 DIAGNOSIS — Z362 Encounter for other antenatal screening follow-up: Secondary | ICD-10-CM

## 2020-10-19 DIAGNOSIS — O43192 Other malformation of placenta, second trimester: Secondary | ICD-10-CM | POA: Diagnosis not present

## 2020-10-19 DIAGNOSIS — Z3A32 32 weeks gestation of pregnancy: Secondary | ICD-10-CM

## 2020-10-19 DIAGNOSIS — O99213 Obesity complicating pregnancy, third trimester: Secondary | ICD-10-CM | POA: Diagnosis not present

## 2020-10-19 DIAGNOSIS — Z3480 Encounter for supervision of other normal pregnancy, unspecified trimester: Secondary | ICD-10-CM | POA: Diagnosis not present

## 2020-10-19 DIAGNOSIS — O43199 Other malformation of placenta, unspecified trimester: Secondary | ICD-10-CM | POA: Diagnosis not present

## 2020-10-19 DIAGNOSIS — O358XX0 Maternal care for other (suspected) fetal abnormality and damage, not applicable or unspecified: Secondary | ICD-10-CM

## 2020-10-19 DIAGNOSIS — O09523 Supervision of elderly multigravida, third trimester: Secondary | ICD-10-CM

## 2020-10-19 DIAGNOSIS — O09529 Supervision of elderly multigravida, unspecified trimester: Secondary | ICD-10-CM | POA: Diagnosis not present

## 2020-10-19 DIAGNOSIS — O43193 Other malformation of placenta, third trimester: Secondary | ICD-10-CM

## 2020-10-19 DIAGNOSIS — D259 Leiomyoma of uterus, unspecified: Secondary | ICD-10-CM

## 2020-10-19 DIAGNOSIS — O3413 Maternal care for benign tumor of corpus uteri, third trimester: Secondary | ICD-10-CM

## 2020-10-21 LAB — URINE CULTURE, OB REFLEX

## 2020-10-21 LAB — CULTURE, OB URINE

## 2020-10-22 ENCOUNTER — Telehealth: Payer: Self-pay | Admitting: Obstetrics and Gynecology

## 2020-10-22 NOTE — Telephone Encounter (Signed)
TC from patient stating that she saw her UCx results in My Chart and wants to know what medication she should get. UCx results reviewed and patient advised that she could either get a shot of Rocephin or Duracef. Patient is at work and receive Rocephin injection there. Advised she would need only 1 dose and have a repetat UCx in 2 weeks.  Laury Deep, CNM

## 2020-10-26 ENCOUNTER — Other Ambulatory Visit (INDEPENDENT_AMBULATORY_CARE_PROVIDER_SITE_OTHER): Payer: BC Managed Care – PPO | Admitting: Obstetrics and Gynecology

## 2020-10-26 DIAGNOSIS — R399 Unspecified symptoms and signs involving the genitourinary system: Secondary | ICD-10-CM

## 2020-10-26 DIAGNOSIS — N3000 Acute cystitis without hematuria: Secondary | ICD-10-CM | POA: Diagnosis not present

## 2020-10-26 MED ORDER — CEFADROXIL 500 MG PO CAPS: 500.0000 mg | ORAL_CAPSULE | Freq: Two times a day (BID) | ORAL | 0 refills | Status: AC

## 2020-10-26 NOTE — Progress Notes (Signed)
TC from patient reporting continued pelvic pressure after receiving Rocephin IM on 10/21/2020. Rx for Cefadroxil sent to pharmacy.  Laury Deep, CNM

## 2020-10-28 ENCOUNTER — Encounter: Payer: Self-pay | Admitting: Obstetrics and Gynecology

## 2020-10-28 ENCOUNTER — Ambulatory Visit (INDEPENDENT_AMBULATORY_CARE_PROVIDER_SITE_OTHER): Payer: BC Managed Care – PPO | Admitting: Obstetrics and Gynecology

## 2020-10-28 ENCOUNTER — Other Ambulatory Visit: Payer: Self-pay

## 2020-10-28 VITALS — BP 112/77 | HR 90 | Temp 98.1°F | Wt 255.2 lb

## 2020-10-28 DIAGNOSIS — Z3A33 33 weeks gestation of pregnancy: Secondary | ICD-10-CM

## 2020-10-28 DIAGNOSIS — Z3480 Encounter for supervision of other normal pregnancy, unspecified trimester: Secondary | ICD-10-CM | POA: Diagnosis not present

## 2020-10-28 DIAGNOSIS — F4320 Adjustment disorder, unspecified: Secondary | ICD-10-CM | POA: Diagnosis not present

## 2020-10-28 DIAGNOSIS — Z23 Encounter for immunization: Secondary | ICD-10-CM | POA: Diagnosis not present

## 2020-10-28 DIAGNOSIS — R399 Unspecified symptoms and signs involving the genitourinary system: Secondary | ICD-10-CM

## 2020-10-28 DIAGNOSIS — M5431 Sciatica, right side: Secondary | ICD-10-CM

## 2020-10-28 NOTE — Progress Notes (Signed)
   LOW-RISK PREGNANCY OFFICE VISIT Patient name: Jessica Banks MRN [id number]  Date of birth: 01-Nov-1981 Chief Complaint:   Routine Prenatal Visit  History of Present Illness:   Jessica Banks is a 39 y.o. G54P1011 female at [redacted]w[redacted]d with an Estimated Date of Delivery: 12/12/20 being seen today for ongoing management of a low-risk pregnancy.  Today she reports sciatic nerve pain. Contractions: Not present. Vag. Bleeding: None.  Movement: Present. denies leaking of fluid. Review of Systems:   Pertinent items are noted in HPI Denies abnormal vaginal discharge w/ itching/odor/irritation, headaches, visual changes, shortness of breath, chest pain, abdominal pain, severe nausea/vomiting, or problems with urination or bowel movements unless otherwise stated above. Pertinent History Reviewed:  Reviewed past medical,surgical, social, obstetrical and family history.  Reviewed problem list, medications and allergies. Physical Assessment:   Vitals:   10/28/20 0800  BP: 112/77  Pulse: 90  Temp: 98.1 F (36.7 C)  Weight: 255 lb 3.2 oz (115.8 kg)  Body mass index is 48.54 kg/m.        Physical Examination:   General appearance: Well appearing, and in no distress  Mental status: Alert, oriented to person, place, and time  Skin: Warm & dry  Cardiovascular: Normal heart rate noted  Respiratory: Normal respiratory effort, no distress  Abdomen: Soft, gravid, nontender  Pelvic: Cervical exam deferred         Extremities: Edema: None  Fetal Status: Fetal Heart Rate (bpm): 141   Movement: Present    No results found for this or any previous visit (from the past 24 hour(s)).  Assessment & Plan:  1) Low-risk pregnancy G3P1011 at [redacted]w[redacted]d with an Estimated Date of Delivery: 12/12/20   2) Supervision of other normal pregnancy, antepartum  - Tdap vaccine greater than or equal to 7yo IM - Discussed GBS and cervical exam at next visit  3) UTI symptoms - Taking Duracef as prescribed - Discussed  the increase chances of pregnancy causing more frequent UTIs due to increased stasis of urine  4) Sciatic pain, right - Using Flexeril and TENS unit prn with good relief of pain  5) [redacted] weeks gestation of pregnancy    Meds: No orders of the defined types were placed in this encounter.  Labs/procedures today: TdaP  Plan:  Continue routine obstetrical care   Reviewed: Preterm labor symptoms and general obstetric precautions including but not limited to vaginal bleeding, contractions, leaking of fluid and fetal movement were reviewed in detail with the patient.  All questions were answered. Has home bp cuff. Check bp weekly, let us know if >140/90.   Follow-up: No follow-ups on file.  Orders Placed This Encounter  Procedures  . Tdap vaccine greater than or equal to 7yo IM   [employer] MSN, CNM 10/28/2020 8:10 AM

## 2020-11-09 ENCOUNTER — Encounter (HOSPITAL_COMMUNITY): Payer: Self-pay | Admitting: Obstetrics and Gynecology

## 2020-11-09 ENCOUNTER — Inpatient Hospital Stay (HOSPITAL_COMMUNITY)
Admission: AD | Admit: 2020-11-09 | Discharge: 2020-11-10 | Disposition: A | Discharge status: Home / Self Care | Payer: BC Managed Care – PPO | Attending: Obstetrics and Gynecology | Admitting: Obstetrics and Gynecology

## 2020-11-09 ENCOUNTER — Other Ambulatory Visit: Payer: Self-pay

## 2020-11-09 DIAGNOSIS — Z3A35 35 weeks gestation of pregnancy: Secondary | ICD-10-CM | POA: Insufficient documentation

## 2020-11-09 DIAGNOSIS — O212 Late vomiting of pregnancy: Secondary | ICD-10-CM | POA: Diagnosis not present

## 2020-11-09 DIAGNOSIS — Z87891 Personal history of nicotine dependence: Secondary | ICD-10-CM | POA: Insufficient documentation

## 2020-11-09 DIAGNOSIS — O09529 Supervision of elderly multigravida, unspecified trimester: Secondary | ICD-10-CM

## 2020-11-09 DIAGNOSIS — Z3689 Encounter for other specified antenatal screening: Secondary | ICD-10-CM

## 2020-11-09 DIAGNOSIS — Z3480 Encounter for supervision of other normal pregnancy, unspecified trimester: Secondary | ICD-10-CM

## 2020-11-09 DIAGNOSIS — O219 Vomiting of pregnancy, unspecified: Secondary | ICD-10-CM

## 2020-11-09 LAB — URINALYSIS, ROUTINE W REFLEX MICROSCOPIC
Glucose, UA: NEGATIVE mg/dL
Hgb urine dipstick: NEGATIVE
Ketones, ur: 20 mg/dL — AB
Nitrite: NEGATIVE
Protein, ur: 100 mg/dL — AB
Specific Gravity, Urine: 1.033 — ABNORMAL HIGH (ref 1.005–1.030)
pH: 5 (ref 5.0–8.0)

## 2020-11-09 MED ORDER — LACTATED RINGERS IV BOLUS
1000.0000 mL | Freq: Once | INTRAVENOUS | Status: AC
  Administered 2020-11-09: 1000 mL via INTRAVENOUS

## 2020-11-09 MED ORDER — FAMOTIDINE IN NACL 20-0.9 MG/50ML-% IV SOLN
20.0000 mg | Freq: Once | INTRAVENOUS | Status: AC
  Administered 2020-11-09: 20 mg via INTRAVENOUS
  Filled 2020-11-09: qty 50

## 2020-11-09 MED ORDER — ONDANSETRON HCL 40 MG/20ML IJ SOLN
8.0000 mg | Freq: Once | INTRAMUSCULAR | Status: DC
Start: 2020-11-09 — End: 2020-11-09

## 2020-11-09 MED ORDER — M.V.I. ADULT IV INJ
Freq: Once | INTRAVENOUS | Status: AC
  Filled 2020-11-09: qty 10

## 2020-11-09 MED ORDER — PROMETHAZINE HCL 25 MG/ML IJ SOLN
25.0000 mg | Freq: Once | INTRAMUSCULAR | Status: AC
  Administered 2020-11-09: 25 mg via INTRAVENOUS
  Filled 2020-11-09: qty 1

## 2020-11-09 NOTE — MAU Note (Addendum)
N/V since 1930. Have tried Zofran and phenergan supp. Have vomited 6 times since then. No diarrhea. Have had constipation this pregnancy and had some hard stools today. Having a lot of pelvic pressure. Denies VB or LOF. Have noticed over last few wks sometimes I have n/v after eating but usually one time and done.

## 2020-11-09 NOTE — MAU Provider Note (Signed)
History     CSN: [id number]  Arrival date and time: 11/09/20 2154   First Provider Initiated Contact with Patient 11/09/20 2255      Chief Complaint  Patient presents with  . Abdominal Pain  . Emesis During Pregnancy   Jessica Banks is a 39 y.o. G3P1011 at [redacted]w[redacted]d who receives care at [facility].  She presents today for Abdominal Pain and Emesis During Pregnancy.  She reports she started feeling nauseous around 1930 and "tried to fight it," but still had emesis.  She states she took Zofran ODT 8mg  at 2000 with no relief as well as a Phenergan Suppository 25mg  at 2045mg .  Patient reports that despite this medication, she continued to have vomiting.  Patient questions if zofran and phenergan were effective. She also reports being "very constipated" despite colace, but had a bowel movement after suppository d/t vomiting. Patient recalls eating Brendolyn Patty this morning with a ham, egg, cheese croissant and an orange juice at 0600. She states she did not eat lunch and had "a mambo," a banana, and a fanta today.  Patient reports this is not common intake and that she usually eats 3x/day.  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    OB History    Gravida  3   Para  1   Term  1   Preterm      AB  1   Living  1     SAB  1   TAB      Ectopic      Multiple  0   Live Births  1           Past Medical History:  Diagnosis Date  . Abnormal Pap smear    CIN I  . Anemia   . Anxiety   . Depression    off meds now, ok now  . Enlarged thyroid gland    nromal function  . GERD (gastroesophageal reflux disease)   . Migraine   . Obesity   . Ovarian cyst   . Vaginal Pap smear, abnormal    ok now    Past Surgical History:  Procedure Laterality Date  . LAPAROSCOPIC GASTRIC SLEEVE RESECTION      Family History  Problem Relation Age of Onset  . Thyroid disease Mother        s/p thyroidectomy  . Arthritis Mother   . Leukemia  Maternal Grandmother   . Stroke Maternal Grandmother   . Cancer Maternal Grandfather        lung  . Heart disease Paternal Grandmother   . COPD Paternal Grandfather     Social History   Tobacco Use  . Smoking status: Former Smoker    Years: 4.00    Types: Cigarettes    Quit date: 06/07/2013    Years since quitting: 7.4  . Smokeless tobacco: Never Used  . Tobacco comment: only smoked on the weekends  Vaping Use  . Vaping Use: Never used  Substance Use Topics  . Alcohol use: Not Currently    Comment: SOCIAL   . Drug use: No    Allergies:  Allergies  Allergen Reactions  . Novocain [Procaine] Anaphylaxis    Medications Prior to Admission  Medication Sig Dispense Refill Last Dose  . butalbital-acetaminophen-caffeine (FIORICET) 50-325-40 MG tablet Take 1-2 tablets by mouth every 4 (four) hours as needed. 30 tablet 0 11/08/2020 at Unknown time  . cetirizine (ZYRTEC ALLERGY) 10 MG tablet  11/08/2020 at Unknown time  . DEXILANT 60 MG capsule TAKE 1 CAPSULE BY MOUTH EVERY DAY 30 capsule 3 11/08/2020 at Unknown time  . docusate sodium (COLACE) 100 MG capsule Take 100 mg by mouth daily.   11/08/2020 at Unknown time  . doxylamine, Sleep, (UNISOM) 25 MG tablet Take 25 mg by mouth at bedtime as needed.   11/08/2020 at Unknown time  . ondansetron (ZOFRAN) 8 MG tablet Take 8 mg by mouth every 8 (eight) hours as needed for nausea or vomiting.   11/09/2020 at Unknown time  . Prenatal Vit-Fe Fumarate-FA (MULTIVITAMIN-PRENATAL) 27-0.8 MG TABS tablet Take 1 tablet by mouth daily at 12 noon.   11/08/2020 at Unknown time  . promethazine (PHENERGAN) 25 MG suppository Place 25 mg rectally every 6 (six) hours as needed for nausea or vomiting.   11/09/2020 at Unknown time    Review of Systems  Constitutional: Negative for chills and fever.  Respiratory: Negative for cough and shortness of breath.   Gastrointestinal: Positive for abdominal pain (Epigastric Pain), nausea and vomiting.   Genitourinary: Negative for difficulty urinating and dysuria.  Neurological: Negative for dizziness, light-headedness and headaches.   Physical Exam   Blood pressure 126/83, pulse (!) 108, resp. rate 20, height 5' (1.524 m), weight 113.9 kg, last menstrual period 02/08/2020, SpO2 100 %, unknown if currently breastfeeding.  Physical Exam Vitals and nursing note reviewed.  Constitutional:      Appearance: She is well-developed.  HENT:     Head: Normocephalic and atraumatic.  Eyes:     Conjunctiva/sclera: Conjunctivae normal.  Cardiovascular:     Rate and Rhythm: Regular rhythm. Tachycardia present.     Heart sounds: Normal heart sounds.  Pulmonary:     Effort: Pulmonary effort is normal. No respiratory distress.     Breath sounds: Normal breath sounds.  Abdominal:     Palpations: Abdomen is soft.     Tenderness: There is no abdominal tenderness.     Comments: Gravid-Appears LGA  Musculoskeletal:     Cervical back: Normal range of motion.     Right lower leg: Edema present.     Left lower leg: Edema present.  Skin:    General: Skin is warm and dry.  Neurological:     Mental Status: She is alert and oriented to person, place, and time.  Psychiatric:        Mood and Affect: Mood normal.        Behavior: Behavior normal.    Fetal Assessment: 145 bpm, Mod Var, -Decels, +Accels Mild irregular contractions graphed and palpated.  MAU Course  Procedures Results for orders placed or performed during the hospital encounter of 11/09/20 (from the past 24 hour(s))  Urinalysis, Routine w reflex microscopic Urine, Clean Catch     Status: Abnormal   Collection Time: 11/09/20 10:38 PM  Result Value Ref Range   Color, Urine AMBER (A) YELLOW   APPearance HAZY (A) CLEAR   Specific Gravity, Urine 1.033 (H) 1.005 - 1.030   pH 5.0 5.0 - 8.0   Glucose, UA NEGATIVE NEGATIVE mg/dL   Hgb urine dipstick NEGATIVE NEGATIVE   Bilirubin Urine SMALL (A) NEGATIVE   Ketones, ur 20 (A) NEGATIVE  mg/dL   Protein, ur 100 (A) NEGATIVE mg/dL   Nitrite NEGATIVE NEGATIVE   Leukocytes,Ua LARGE (A) NEGATIVE   RBC / HPF 0-5 0 - 5 RBC/hpf   WBC, UA 6-10 0 - 5 WBC/hpf   Bacteria, UA RARE (A) NONE SEEN   Squamous Epithelial /  LPF 6-10 0 - 5   Mucus PRESENT    MDM Start IV LR Bolus f/b MVI Antiemetic PPI Assessment and Plan  39 year old, G3P1011  SIUP at 35.3weeks Cat I FT N/V  -Reassured of common return of 1st trimester symptoms in the 3rd trimester. -Encouraged increased water intake as much as tolerated when considering sleeve. -Reviewed POC with patient. -Exam performed.  -Will start IV and give LR bolus f/b MVI -Pepcid 20mg  IV -Patient initially refuses antiemetic, but has bout of vomiting while provider at bedside. -Agreeable to antiemetic-Will give Phenergan 25mg  IV   Maryann Conners 11/09/2020, 10:55 PM   Reassessment (12:38 AM) -Patient reports improvement in nausea with current interventions, however, states she does not want to eat.  -LR complete and discussed infusion of MV, patient agreeable. -Nurse updated on POC and will call pharmacy to expedite delivery. -Will continue to monitor and reassess.   Reassessment (1:54 AM)  -Infusion 3/4 complete. -Patient reports nausea continues to be improved and requests discharge. -Encouraged to call or return to MAU if symptoms worsen or with the onset of new symptoms. -Plan to follow up with this provider in office Friday Dec 3rd.  -Discharged to home in improved and stable condition.  Maryann Conners MSN, CNM Advanced Practice Provider, [facility] [employer]

## 2020-11-10 NOTE — Discharge Instructions (Signed)
Postpartum Tubal Ligation Postpartum tubal ligation (PPTL) is a procedure to close the fallopian tubes. This is done so that you cannot get pregnant. When the fallopian tubes are closed, the eggs that the ovaries release cannot enter the uterus, and sperm cannot reach the eggs. PPTL is done right after childbirth or 1-2 days after childbirth, before the uterus returns to its normal location. If you have a cesarean section, it can be performed at the same time as the procedure. Having this done after childbirth does not make your stay in the hospital longer. PPTL is sometimes called "getting your tubes tied." You should not have this procedure if you want to get pregnant again or if you are unsure about having more children. Tell a health care provider about:  Any allergies you have.  All medicines you are taking, including vitamins, herbs, eye drops, creams, and over-the-counter medicines.  Any problems you or family members have had with anesthetic medicines.  Any blood disorders you have.  Any surgeries you have had.  Any medical conditions you have or have had.  Any past pregnancies. What are the risks? Generally, this is a safe procedure. However, problems may occur, including:  Infection.  Bleeding.  Injury to other organs in the abdomen.  Side effects from anesthetic medicines.  Failure of the procedure. If this happens, you could get pregnant.  Having a fertilized egg attach outside the uterus (ectopic pregnancy). What happens before the procedure?  Ask your health care provider about: ? How much pain you can expect to have. ? What medicines you will be given for pain, especially if you are planning to breastfeed. What happens during the procedure? If you had a vaginal delivery:  You will be given one or more of the following: ? A medicine to help you relax (sedative). ? A medicine to numb the area (local anesthetic). ? A medicine to make you fall asleep (general  anesthetic). ? A medicine that is injected into an area of your body to numb everything below the injection site (regional anesthetic).  If you have been given a general anesthetic, a tube will be put down your throat to help you breathe.  An IV will be inserted into one of your veins.  Your bladder may be emptied with a small tube (catheter).  An incision will be made just below your belly button.  Your fallopian tubes will be located and brought up through the incision.  Your fallopian tubes will be tied off, burned (cauterized), or blocked with a clip, ring, or clamp. A small part in the center of each fallopian tube may be removed.  The incision will be closed with stitches (sutures).  A bandage (dressing) will be placed over the incision. If you had a cesarean delivery:  Tubal ligation will be done through the incision that was used for the cesarean delivery of your baby.  The incision will be closed with sutures.  A dressing will be placed over the incision. The procedure may vary among health care providers and hospitals. What happens after the procedure?  Your blood pressure, heart rate, breathing rate, and blood oxygen level will be monitored until you leave the hospital.  You will be given pain medicine as needed.  Do not drive for 24 hours if you were given a sedative during your procedure. Summary  Postpartum tubal ligation is a procedure that closes the fallopian tubes so you cannot get pregnant anymore.  This procedure is done while you are still   in the hospital after childbirth. If you have a cesarean section, it can be performed at the same time.  Having this done after childbirth does not make your stay in the hospital longer.  Postpartum tubal ligation is considered permanent. You should not have this procedure if you want to get pregnant again or if you are unsure about having more children.  Talk to your health care provider to see if this procedure is  right for you. This information is not intended to replace advice given to you by your health care provider. Make sure you discuss any questions you have with your health care provider. Document Revised: 05/19/2019 Document Reviewed: 10/24/2018 Elsevier Patient Education  [place].

## 2020-11-15 ENCOUNTER — Telehealth: Payer: Self-pay

## 2020-11-15 NOTE — Telephone Encounter (Signed)
Patient called in to cancel all of her BPP's and Follow Up Ultrasound appointments.   When asked why she needs to cancel, she stated that her Midwife said that she did not need these appointments. Routing to our doctors to inform them of the cancellations.

## 2020-11-16 ENCOUNTER — Ambulatory Visit: Payer: BC Managed Care – PPO

## 2020-11-19 ENCOUNTER — Other Ambulatory Visit (HOSPITAL_COMMUNITY)
Admission: RE | Admit: 2020-11-19 | Discharge: 2020-11-19 | Disposition: A | Discharge status: Home / Self Care | Payer: BC Managed Care – PPO | Source: Ambulatory Visit

## 2020-11-19 ENCOUNTER — Ambulatory Visit (INDEPENDENT_AMBULATORY_CARE_PROVIDER_SITE_OTHER): Payer: BC Managed Care – PPO

## 2020-11-19 ENCOUNTER — Other Ambulatory Visit: Payer: Self-pay

## 2020-11-19 VITALS — BP 118/74 | HR 95 | Temp 98.4°F | Wt 254.8 lb

## 2020-11-19 DIAGNOSIS — Z3480 Encounter for supervision of other normal pregnancy, unspecified trimester: Secondary | ICD-10-CM | POA: Insufficient documentation

## 2020-11-19 DIAGNOSIS — Z3483 Encounter for supervision of other normal pregnancy, third trimester: Secondary | ICD-10-CM | POA: Diagnosis not present

## 2020-11-19 DIAGNOSIS — Z3A36 36 weeks gestation of pregnancy: Secondary | ICD-10-CM

## 2020-11-19 DIAGNOSIS — F4320 Adjustment disorder, unspecified: Secondary | ICD-10-CM | POA: Diagnosis not present

## 2020-11-19 DIAGNOSIS — O99210 Obesity complicating pregnancy, unspecified trimester: Secondary | ICD-10-CM

## 2020-11-19 NOTE — Patient Instructions (Signed)
Postpartum Tubal Ligation Postpartum tubal ligation (PPTL) is a procedure to close the fallopian tubes. This is done so that you cannot get pregnant. When the fallopian tubes are closed, the eggs that the ovaries release cannot enter the uterus, and sperm cannot reach the eggs. PPTL is done right after childbirth or 1-2 days after childbirth, before the uterus returns to its normal location. If you have a cesarean section, it can be performed at the same time as the procedure. Having this done after childbirth does not make your stay in the hospital longer. PPTL is sometimes called "getting your tubes tied." You should not have this procedure if you want to get pregnant again or if you are unsure about having more children. Tell a health care provider about:  Any allergies you have.  All medicines you are taking, including vitamins, herbs, eye drops, creams, and over-the-counter medicines.  Any problems you or family members have had with anesthetic medicines.  Any blood disorders you have.  Any surgeries you have had.  Any medical conditions you have or have had.  Any past pregnancies. What are the risks? Generally, this is a safe procedure. However, problems may occur, including:  Infection.  Bleeding.  Injury to other organs in the abdomen.  Side effects from anesthetic medicines.  Failure of the procedure. If this happens, you could get pregnant.  Having a fertilized egg attach outside the uterus (ectopic pregnancy). What happens before the procedure?  Ask your health care provider about: ? How much pain you can expect to have. ? What medicines you will be given for pain, especially if you are planning to breastfeed. What happens during the procedure? If you had a vaginal delivery:  You will be given one or more of the following: ? A medicine to help you relax (sedative). ? A medicine to numb the area (local anesthetic). ? A medicine to make you fall asleep (general  anesthetic). ? A medicine that is injected into an area of your body to numb everything below the injection site (regional anesthetic).  If you have been given a general anesthetic, a tube will be put down your throat to help you breathe.  An IV will be inserted into one of your veins.  Your bladder may be emptied with a small tube (catheter).  An incision will be made just below your belly button.  Your fallopian tubes will be located and brought up through the incision.  Your fallopian tubes will be tied off, burned (cauterized), or blocked with a clip, ring, or clamp. A small part in the center of each fallopian tube may be removed.  The incision will be closed with stitches (sutures).  A bandage (dressing) will be placed over the incision. If you had a cesarean delivery:  Tubal ligation will be done through the incision that was used for the cesarean delivery of your baby.  The incision will be closed with sutures.  A dressing will be placed over the incision. The procedure may vary among health care providers and hospitals. What happens after the procedure?  Your blood pressure, heart rate, breathing rate, and blood oxygen level will be monitored until you leave the hospital.  You will be given pain medicine as needed.  Do not drive for 24 hours if you were given a sedative during your procedure. Summary  Postpartum tubal ligation is a procedure that closes the fallopian tubes so you cannot get pregnant anymore.  This procedure is done while you are still   in the hospital after childbirth. If you have a cesarean section, it can be performed at the same time.  Having this done after childbirth does not make your stay in the hospital longer.  Postpartum tubal ligation is considered permanent. You should not have this procedure if you want to get pregnant again or if you are unsure about having more children.  Talk to your health care provider to see if this procedure is  right for you. This information is not intended to replace advice given to you by your health care provider. Make sure you discuss any questions you have with your health care provider. Document Revised: 05/19/2019 Document Reviewed: 10/24/2018 Elsevier Patient Education  [place].

## 2020-11-19 NOTE — Progress Notes (Signed)
   LOW-RISK PREGNANCY OFFICE VISIT  Patient name: Jessica Banks MRN [id number]  Date of birth: [birth year] Chief Complaint:   Routine Prenatal Visit  Subjective:   Jessica Banks is a 39 y.o. G20P1011 female at [redacted]w[redacted]d with an Estimated Date of Delivery: 12/12/20 being seen today for ongoing management of a low-risk pregnancy aeb has Anxiety; Migraine; Obesity; Near syncope; Supervision of other normal pregnancy, antepartum; Antepartum multigravida of advanced maternal age; and Obesity affecting pregnancy, antepartum on their problem list.  Patient presents today with complaint of pelvic pressure and intermittent back pain. She states she thinks they are BH contractions. Patient endorses fetal movement and denies vaginal concerns including abnormal discharge, leaking of fluid, and bleeding.  Contractions: Irritability. Vag. Bleeding: None.  Movement: Present.  Reviewed past medical,surgical, social, obstetrical and family history as well as problem list, medications and allergies.  Objective   Vitals:   11/19/20 0855  BP: 118/74  Pulse: 95  Temp: 98.4 F (36.9 C)  Weight: 254 lb 12.8 oz (115.6 kg)  Body mass index is 49.76 kg/m.  Total Weight Gain:10 lb 12.8 oz (4.899 kg)         Physical Examination:   General appearance: Well appearing, and in no distress  Mental status: Alert, oriented to person, place, and time  Skin: Warm & dry  Cardiovascular: Normal heart rate noted  Respiratory: Normal respiratory effort, no distress  Abdomen: Soft, gravid, nontender, AGA with Fundal height of Fundal Height: 37 cm  Pelvic: Cervical exam performed  Dilation: 2 Effacement (%): Thick Station: Ballotable Presentation: Vertex  Extremities: Edema: None  Fetal Status: Fetal Heart Rate (bpm): 134  Movement: Present   No results found for this or any previous visit (from the past 24 hour(s)).  Assessment & Plan:  Low-risk pregnancy of a 39 y.o., G3P1011 at [redacted]w[redacted]d with an Estimated Date  of Delivery: 12/12/20   1. Supervision of other normal pregnancy, antepartum -Anticipatory guidance for upcoming visits. -Discussed exam findings today. -Reviewed characteristics of contractions and how to distinguish them from general pain.  -Reassured that she should be able to continue to work her remaining 5 shifts despite dilation. -Labor Precautions.   2. Obesity affecting pregnancy, antepartum -Taking bASA  3. [redacted] weeks gestation of pregnancy -Labs as below. -Educated on GBS bacteria including what it is, why we test, and how and when we treat if needed. -Discussed releasing of results to mychart. -Discussed and reviewed postpartum contraception. *Patient is considering BTL for contraception. -Reviewed meeting with MD to discuss procedure.  Informed that provider will not know who will be performing procedure, but that it would be completed after delivery. -Patient agreeable with pre-procedure consultation via virtual visit. Provider will attempt to schedule accordingly.     Meds: No orders of the defined types were placed in this encounter.  Labs/procedures today:  Lab Orders     Culture, beta strep (group b only)   Reviewed: Preterm labor symptoms and general obstetric precautions including but not limited to vaginal bleeding, contractions, leaking of fluid and fetal movement were reviewed in detail with the patient.  All questions were answered.  Follow-up: No follow-ups on file.  Orders Placed This Encounter  Procedures  . Culture, beta strep (group b only)   Maryann Conners MSN, CNM 11/19/2020

## 2020-11-22 ENCOUNTER — Telehealth: Payer: Self-pay | Admitting: General Practice

## 2020-11-22 LAB — CULTURE, BETA STREP (GROUP B ONLY): Strep Gp B Culture: POSITIVE — AB

## 2020-11-22 NOTE — Telephone Encounter (Signed)
Pt aware of virtual appt with Dr. Rip Harbour on 11/26/2020 at 1030 and pt verbalized understanding.

## 2020-11-23 ENCOUNTER — Ambulatory Visit: Payer: BC Managed Care – PPO

## 2020-11-23 LAB — CERVICOVAGINAL ANCILLARY ONLY
Chlamydia: NEGATIVE
Comment: NEGATIVE
Comment: NEGATIVE
Comment: NORMAL
Neisseria Gonorrhea: NEGATIVE
Trichomonas: NEGATIVE

## 2020-11-25 ENCOUNTER — Ambulatory Visit (INDEPENDENT_AMBULATORY_CARE_PROVIDER_SITE_OTHER): Payer: BC Managed Care – PPO | Admitting: Obstetrics and Gynecology

## 2020-11-25 ENCOUNTER — Other Ambulatory Visit: Payer: Self-pay

## 2020-11-25 ENCOUNTER — Encounter: Payer: Self-pay | Admitting: Obstetrics and Gynecology

## 2020-11-25 ENCOUNTER — Other Ambulatory Visit (INDEPENDENT_AMBULATORY_CARE_PROVIDER_SITE_OTHER): Payer: BC Managed Care – PPO | Admitting: Obstetrics and Gynecology

## 2020-11-25 VITALS — BP 134/85 | HR 92 | Temp 97.9°F | Wt 250.8 lb

## 2020-11-25 DIAGNOSIS — O99210 Obesity complicating pregnancy, unspecified trimester: Secondary | ICD-10-CM

## 2020-11-25 DIAGNOSIS — O09529 Supervision of elderly multigravida, unspecified trimester: Secondary | ICD-10-CM

## 2020-11-25 DIAGNOSIS — F411 Generalized anxiety disorder: Secondary | ICD-10-CM | POA: Diagnosis not present

## 2020-11-25 DIAGNOSIS — Z3480 Encounter for supervision of other normal pregnancy, unspecified trimester: Secondary | ICD-10-CM

## 2020-11-25 DIAGNOSIS — Z3A37 37 weeks gestation of pregnancy: Secondary | ICD-10-CM

## 2020-11-25 NOTE — Progress Notes (Signed)
   LOW-RISK PREGNANCY OFFICE VISIT Patient name: Jessica Banks MRN [id number]  Date of birth: Apr 28, 1981 Chief Complaint:   Routine Prenatal Visit  History of Present Illness:   Jessica Banks is a 39 y.o. G50P1011 female at [redacted]w[redacted]d with an Estimated Date of Delivery: 12/12/20 being seen today for ongoing management of a low-risk pregnancy.  Today she reports occasional contractions and lost mucous plug on Tuesday 11/23/20. Contractions: Irregular. Vag. Bleeding: None.  Movement: Present. denies leaking of fluid. Review of Systems:   Pertinent items are noted in HPI Denies abnormal vaginal discharge w/ itching/odor/irritation, headaches, visual changes, shortness of breath, chest pain, abdominal pain, severe nausea/vomiting, or problems with urination or bowel movements unless otherwise stated above. Pertinent History Reviewed:  Reviewed past medical,surgical, social, obstetrical and family history.  Reviewed problem list, medications and allergies. Physical Assessment:   Vitals:   11/25/20 0914  BP: 134/85  Pulse: 92  Temp: 97.9 F (36.6 C)  Weight: 250 lb 12.8 oz (113.8 kg)  Body mass index is 48.98 kg/m.        Physical Examination:   General appearance: Well appearing, and in no distress  Mental status: Alert, oriented to person, place, and time  Skin: Warm & dry  Cardiovascular: Normal heart rate noted  Respiratory: Normal respiratory effort, no distress  Abdomen: Soft, gravid, nontender  Pelvic: Cervical exam performed         Extremities: Edema: None  Fetal Status: Fetal Heart Rate (bpm): 135   Movement: Present    No results found for this or any previous visit (from the past 24 hour(s)).  Assessment & Plan:  1) Low-risk pregnancy G3P1011 at [redacted]w[redacted]d with an Estimated Date of Delivery: 12/12/20   2) Supervision of other normal pregnancy, antepartum - Discussed with patient and husband about what to expect during induction, the role of their doula vs the role of  their midwife - Discussed best method for IOL: pitocin and AROM - IOL scheduled for 12/07/2020 midnight -- orders entered in Epic  3) Obesity affecting pregnancy, antepartum  4) Antepartum multigravida of advanced maternal age  61) [redacted] weeks gestation of pregnancy    Meds: No orders of the defined types were placed in this encounter.  Labs/procedures today: cervical exam  Plan:  Continue routine obstetrical care   Reviewed: Term labor symptoms and general obstetric precautions including but not limited to vaginal bleeding, contractions, leaking of fluid and fetal movement were reviewed in detail with the patient.  All questions were answered. Has home bp cuff. Check bp weekly, let us know if >140/90.   Follow-up: Return in about 1 week (around 12/02/2020) for Return OB visit.  No orders of the defined types were placed in this encounter.  Laury Deep MSN, CNM 11/25/2020

## 2020-11-25 NOTE — Progress Notes (Signed)
IOL orders entered 

## 2020-11-25 NOTE — Patient Instructions (Signed)
Signs and Symptoms of Labor Labor is your body's natural process of moving your baby, placenta, and umbilical cord out of your uterus. The process of labor usually starts when your baby is full-term, between 37 and 40 weeks of pregnancy. How will I know when I am close to going into labor? As your body prepares for labor and the birth of your baby, you may notice the following symptoms in the weeks and days before true labor starts:  Having a strong desire to get your home ready to receive your new baby. This is called nesting. Nesting may be a sign that labor is approaching, and it may occur several weeks before birth. Nesting may involve cleaning and organizing your home.  Passing a small amount of thick, bloody mucus out of your vagina (normal bloody show or losing your mucus plug). This may happen more than a week before labor begins, or it might occur right before labor begins as the opening of the cervix starts to widen (dilate). For some women, the entire mucus plug passes at once. For others, smaller portions of the mucus plug may gradually pass over several days.  Your baby moving (dropping) lower in your pelvis to get into position for birth (lightening). When this happens, you may feel more pressure on your bladder and pelvic bone and less pressure on your ribs. This may make it easier to breathe. It may also cause you to need to urinate more often and have problems with bowel movements.  Having "practice contractions" (Braxton Hicks contractions) that occur at irregular (unevenly spaced) intervals that are more than 10 minutes apart. This is also called false labor. False labor contractions are common after exercise or sexual activity, and they will stop if you change position, rest, or drink fluids. These contractions are usually mild and do not get stronger over time. They may feel like: ? A backache or back pain. ? Mild cramps, similar to menstrual cramps. ? Tightening or pressure in  your abdomen. Other early symptoms that labor may be starting soon include:  Nausea or loss of appetite.  Diarrhea.  Having a sudden burst of energy, or feeling very tired.  Mood changes.  Having trouble sleeping. How will I know when labor has begun? Signs that true labor has begun may include:  Having contractions that come at regular (evenly spaced) intervals and increase in intensity. This may feel like more intense tightening or pressure in your abdomen that moves to your back. ? Contractions may also feel like rhythmic pain in your upper thighs or back that comes and goes at regular intervals. ? For first-time mothers, this change in intensity of contractions often occurs at a more gradual pace. ? Women who have given birth before may notice a more rapid progression of contraction changes.  Having a feeling of pressure in the vaginal area.  Your water breaking (rupture of membranes). This is when the sac of fluid that surrounds your baby breaks. When this happens, you will notice fluid leaking from your vagina. This may be clear or blood-tinged. Labor usually starts within 24 hours of your water breaking, but it may take longer to begin. ? Some women notice this as a gush of fluid. ? Others notice that their underwear repeatedly becomes damp. Follow these instructions at home:   When labor starts, or if your water breaks, call your health care provider or nurse care line. Based on your situation, they will determine when you should go in for an   exam.  When you are in early labor, you may be able to rest and manage symptoms at home. Some strategies to try at home include: ? Breathing and relaxation techniques. ? Taking a warm bath or shower. ? Listening to music. ? Using a heating pad on the lower back for pain. If you are directed to use heat:  Place a towel between your skin and the heat source.  Leave the heat on for 20-30 minutes.  Remove the heat if your skin turns  bright red. This is especially important if you are unable to feel pain, heat, or cold. You may have a greater risk of getting burned. Get help right away if:  You have painful, regular contractions that are 5 minutes apart or less.  Labor starts before you are [redacted] weeks along in your pregnancy.  You have a fever.  You have a headache that does not go away.  You have bright red blood coming from your vagina.  You do not feel your baby moving.  You have a sudden onset of: ? Severe headache with vision problems. ? Nausea, vomiting, or diarrhea. ? Chest pain or shortness of breath. These symptoms may be an emergency. If your health care provider recommends that you go to the hospital or birth center where you plan to deliver, do not drive yourself. Have someone else drive you, or call emergency services (911 in the [place] Summary  Labor is your body's natural process of moving your baby, placenta, and umbilical cord out of your uterus.  The process of labor usually starts when your baby is full-term, between 28 and 40 weeks of pregnancy.  When labor starts, or if your water breaks, call your health care provider or nurse care line. Based on your situation, they will determine when you should go in for an exam. This information is not intended to replace advice given to you by your health care provider. Make sure you discuss any questions you have with your health care provider. Document Revised: 09/03/2017 Document Reviewed: 05/11/2017 Elsevier Patient Education  2020 [place] induction is when steps are taken to cause a pregnant woman to begin the labor process. Most women go into labor on their own between 37 weeks and 42 weeks of pregnancy. When this does not happen or when there is a medical need for labor to begin, steps may be taken to induce labor. Labor induction causes a pregnant woman's uterus to contract. It also causes the cervix to soften (ripen),  open (dilate), and thin out (efface). Usually, labor is not induced before 39 weeks of pregnancy unless there is a medical reason to do so. Your health care provider will determine if labor induction is needed. Before inducing labor, your health care provider will consider a number of factors, including:  Your medical condition and your baby's.  How many weeks along you are in your pregnancy.  How mature your baby's lungs are.  The condition of your cervix.  The position of your baby.  The size of your birth canal. What are some reasons for labor induction? Labor may be induced if:  Your health or your baby's health is at risk.  Your pregnancy is overdue by 1 week or more.  Your water breaks but labor does not start on its own.  There is a low amount of amniotic fluid around your baby. You may also choose (elect) to have labor induced at a certain time. Generally, elective labor  induction is done no earlier than 39 weeks of pregnancy. What methods are used for labor induction? Methods used for labor induction include:  Prostaglandin medicine. This medicine starts contractions and causes the cervix to dilate and ripen. It can be taken by mouth (orally) or by being inserted into the vagina (suppository).  Inserting a small, thin tube (catheter) with a balloon into the vagina and then expanding the balloon with water to dilate the cervix.  Stripping the membranes. In this method, your health care provider gently separates amniotic sac tissue from the cervix. This causes the cervix to stretch, which in turn causes the release of a hormone called progesterone. The hormone causes the uterus to contract. This procedure is often done during an office visit, after which you will be sent home to wait for contractions to begin.  Breaking the water. In this method, your health care provider uses a small instrument to make a small hole in the amniotic sac. This eventually causes the amniotic sac  to break. Contractions should begin after a few hours.  Medicine to trigger or strengthen contractions. This medicine is given through an IV that is inserted into a vein in your arm. Except for membrane stripping, which can be done in a clinic, labor induction is done in the hospital so that you and your baby can be carefully monitored. How long does it take for labor to be induced? The length of time it takes to induce labor depends on how ready your body is for labor. Some inductions can take up to 2-3 days, while others may take less than a day. Induction may take longer if:  You are induced early in your pregnancy.  It is your first pregnancy.  Your cervix is not ready. What are some risks associated with labor induction? Some risks associated with labor induction include:  Changes in fetal heart rate, such as being too high, too low, or irregular (erratic).  Failed induction.  Infection in the mother or the baby.  Increased risk of having a cesarean delivery.  Fetal death.  Breaking off (abruption) of the placenta from the uterus (rare).  Rupture of the uterus (very rare). When induction is needed for medical reasons, the benefits of induction generally outweigh the risks. What are some reasons for not inducing labor? Labor induction should not be done if:  Your baby does not tolerate contractions.  You have had previous surgeries on your uterus, such as a myomectomy, removal of fibroids, or a vertical scar from a previous cesarean delivery.  Your placenta lies very low in your uterus and blocks the opening of the cervix (placenta previa).  Your baby is not in a head-down position.  The umbilical cord drops down into the birth canal in front of the baby.  There are unusual circumstances, such as the baby being very early (premature).  You have had more than 2 previous cesarean deliveries. Summary  Labor induction is when steps are taken to cause a pregnant woman to  begin the labor process.  Labor induction causes a pregnant woman's uterus to contract. It also causes the cervix to ripen, dilate, and efface.  Labor is not induced before 39 weeks of pregnancy unless there is a medical reason to do so.  When induction is needed for medical reasons, the benefits of induction generally outweigh the risks. This information is not intended to replace advice given to you by your health care provider. Make sure you discuss any questions you have with  your health care provider. Document Revised: 12/07/2017 Document Reviewed: 01/17/2017 Elsevier Patient Education  2020 [employer].

## 2020-11-26 ENCOUNTER — Telehealth (HOSPITAL_COMMUNITY): Payer: Self-pay | Admitting: *Deleted

## 2020-11-26 ENCOUNTER — Encounter: Payer: Self-pay | Admitting: Obstetrics and Gynecology

## 2020-11-26 ENCOUNTER — Encounter (HOSPITAL_COMMUNITY): Payer: Self-pay

## 2020-11-26 ENCOUNTER — Telehealth (INDEPENDENT_AMBULATORY_CARE_PROVIDER_SITE_OTHER): Payer: BC Managed Care – PPO | Admitting: Obstetrics and Gynecology

## 2020-11-26 DIAGNOSIS — O09529 Supervision of elderly multigravida, unspecified trimester: Secondary | ICD-10-CM

## 2020-11-26 DIAGNOSIS — Z3480 Encounter for supervision of other normal pregnancy, unspecified trimester: Secondary | ICD-10-CM

## 2020-11-26 DIAGNOSIS — Z3A37 37 weeks gestation of pregnancy: Secondary | ICD-10-CM

## 2020-11-26 DIAGNOSIS — O09523 Supervision of elderly multigravida, third trimester: Secondary | ICD-10-CM

## 2020-11-26 DIAGNOSIS — Z3009 Encounter for other general counseling and advice on contraception: Secondary | ICD-10-CM | POA: Insufficient documentation

## 2020-11-26 NOTE — Patient Instructions (Signed)
Postpartum Tubal Ligation Postpartum tubal ligation (PPTL) is a procedure to close the fallopian tubes. This is done so that you cannot get pregnant. When the fallopian tubes are closed, the eggs that the ovaries release cannot enter the uterus, and sperm cannot reach the eggs. PPTL is done right after childbirth or 1-2 days after childbirth, before the uterus returns to its normal location. If you have a cesarean section, it can be performed at the same time as the procedure. Having this done after childbirth does not make your stay in the hospital longer. PPTL is sometimes called "getting your tubes tied." You should not have this procedure if you want to get pregnant again or if you are unsure about having more children. Tell a health care provider about:  Any allergies you have.  All medicines you are taking, including vitamins, herbs, eye drops, creams, and over-the-counter medicines.  Any problems you or family members have had with anesthetic medicines.  Any blood disorders you have.  Any surgeries you have had.  Any medical conditions you have or have had.  Any past pregnancies. What are the risks? Generally, this is a safe procedure. However, problems may occur, including:  Infection.  Bleeding.  Injury to other organs in the abdomen.  Side effects from anesthetic medicines.  Failure of the procedure. If this happens, you could get pregnant.  Having a fertilized egg attach outside the uterus (ectopic pregnancy). What happens before the procedure?  Ask your health care provider about: ? How much pain you can expect to have. ? What medicines you will be given for pain, especially if you are planning to breastfeed. What happens during the procedure? If you had a vaginal delivery:  You will be given one or more of the following: ? A medicine to help you relax (sedative). ? A medicine to numb the area (local anesthetic). ? A medicine to make you fall asleep (general  anesthetic). ? A medicine that is injected into an area of your body to numb everything below the injection site (regional anesthetic).  If you have been given a general anesthetic, a tube will be put down your throat to help you breathe.  An IV will be inserted into one of your veins.  Your bladder may be emptied with a small tube (catheter).  An incision will be made just below your belly button.  Your fallopian tubes will be located and brought up through the incision.  Your fallopian tubes will be tied off, burned (cauterized), or blocked with a clip, ring, or clamp. A small part in the center of each fallopian tube may be removed.  The incision will be closed with stitches (sutures).  A bandage (dressing) will be placed over the incision. If you had a cesarean delivery:  Tubal ligation will be done through the incision that was used for the cesarean delivery of your baby.  The incision will be closed with sutures.  A dressing will be placed over the incision. The procedure may vary among health care providers and hospitals. What happens after the procedure?  Your blood pressure, heart rate, breathing rate, and blood oxygen level will be monitored until you leave the hospital.  You will be given pain medicine as needed.  Do not drive for 24 hours if you were given a sedative during your procedure. Summary  Postpartum tubal ligation is a procedure that closes the fallopian tubes so you cannot get pregnant anymore.  This procedure is done while you are still   in the hospital after childbirth. If you have a cesarean section, it can be performed at the same time.  Having this done after childbirth does not make your stay in the hospital longer.  Postpartum tubal ligation is considered permanent. You should not have this procedure if you want to get pregnant again or if you are unsure about having more children.  Talk to your health care provider to see if this procedure is  right for you. This information is not intended to replace advice given to you by your health care provider. Make sure you discuss any questions you have with your health care provider. Document Revised: 05/19/2019 Document Reviewed: 10/24/2018 Elsevier Patient Education  [employer].

## 2020-11-26 NOTE — Progress Notes (Signed)
S/w pt for virtual visit, pt reports fetal movement with pressure. Pt does not have BP cuff but had an OB visit yesterday with midwife, pt would like to discuss BTL with MD today.

## 2020-11-26 NOTE — Telephone Encounter (Signed)
Preadmission screen  

## 2020-11-26 NOTE — Progress Notes (Signed)
OBSTETRICS PRENATAL VIRTUAL VISIT ENCOUNTER NOTE  Provider location: [facility] [place]   I connected with [name] on 11/26/20 at 10:30 AM EST by MyChart Video Encounter at home and verified that I am speaking with the correct person using two identifiers.   I discussed the limitations, risks, security and privacy concerns of performing an evaluation and management service virtually and the availability of in person appointments. I also discussed with the patient that there may be a patient responsible charge related to this service. The patient expressed understanding and agreed to proceed. Subjective:  Jessica Banks is a 39 y.o. G3P1011 at [redacted]w[redacted]d being seen today for ongoing prenatal care.  She is currently monitored for the following issues for this low-risk pregnancy and has Anxiety; Migraine; Obesity; Near syncope; Supervision of other normal pregnancy, antepartum; Antepartum multigravida of advanced maternal age; Obesity affecting pregnancy, antepartum; and Unwanted fertility on their problem list.  Patient reports no complaints.  Contractions: Irregular. Vag. Bleeding: None.  Movement: Present. Denies any leaking of fluid.   The following portions of the patient's history were reviewed and updated as appropriate: allergies, current medications, past family history, past medical history, past social history, past surgical history and problem list.   Objective:  There were no vitals filed for this visit.  Fetal Status:     Movement: Present     General:  Alert, oriented and cooperative. Patient is in no acute distress.  Respiratory: Normal respiratory effort, no problems with respiration noted  Mental Status: Normal mood and affect. Normal behavior. Normal judgment and thought content.  Rest of physical exam deferred due to type of encounter  Imaging: No results found.  Assessment and Plan:  Pregnancy: G3P1011 at [redacted]w[redacted]d 1. Supervision of  other normal pregnancy, antepartum Stable  2. Antepartum multigravida of advanced maternal age Stable  3. Unwanted fertility Patient desires bilateral tubal sterilization.  Other reversible forms of contraception were discussed with patient; she declines all other modalities. Discussed bilateral tubal sterilization in detail; discussed options of laparoscopic bilateral tubal sterilization using Filshie clips vs laparoscopic bilateral salpingectomy. Risks and benefits discussed in detail including but not limited to: risk of regret, permanence of method, bleeding, infection, injury to surrounding organs and need for additional procedures.  Failure risk of 1-2 % for Filshie clips and <1% for bilateral salpingectomy with increased risk of ectopic gestation if pregnancy occurs was also discussed with patient.  Also discussed possible reduction of risk of ovarian cancer via bilateral salpingectomy given that a growing body of knowledge reveals that the majority of cases of high grade serous "ovarian" cancer actually are actually  cancers arising from the fimbriated end of the fallopian tubes. Emphasized that removal of fallopian tubes do not result in any known hormonal imbalance.  Patient verbalized understanding of these risks and benefits.   Printed patient education handouts about the procedure was given to the patient to review at home.  Medicaid papers are not needed as pt has private insurance and no secondary coverage.      Preterm labor symptoms and general obstetric precautions including but not limited to vaginal bleeding, contractions, leaking of fluid and fetal movement were reviewed in detail with the patient. I discussed the assessment and treatment plan with the patient. The patient was provided an opportunity to ask questions and all were answered. The patient agreed with the plan and demonstrated an understanding of the instructions. The patient was advised to call back or seek an  in-person office evaluation/go to [facility]U at [facility] for any urgent or concerning symptoms. Please refer to After Visit Summary for other counseling recommendations.   I provided 10 minutes of face-to-face time during this encounter.  No follow-ups on file.  Future Appointments  Date Time Provider [place]  12/02/2020  8:50 AM Laury Deep, CNM [facility] None  12/06/2020  9:20 AM [facility] None  12/07/2020 12:00 AM MC-LD [place] [facility] None  12/09/2020  8:50 AM Laury Deep, CNM [facility] None  03/03/2021  9:30 AM Glendale Chard, MD [facility] None    Chancy Milroy, [place] for [facility], [place]

## 2020-11-28 ENCOUNTER — Other Ambulatory Visit: Payer: Self-pay | Admitting: Advanced Practice Midwife

## 2020-11-29 ENCOUNTER — Encounter (HOSPITAL_COMMUNITY): Payer: Self-pay | Admitting: *Deleted

## 2020-11-29 ENCOUNTER — Telehealth (HOSPITAL_COMMUNITY): Payer: Self-pay | Admitting: *Deleted

## 2020-11-29 NOTE — Telephone Encounter (Signed)
Preadmission screen  

## 2020-11-30 ENCOUNTER — Encounter (HOSPITAL_COMMUNITY): Payer: Self-pay

## 2020-11-30 ENCOUNTER — Ambulatory Visit: Payer: BC Managed Care – PPO

## 2020-12-02 ENCOUNTER — Encounter: Payer: Self-pay | Admitting: Obstetrics and Gynecology

## 2020-12-02 ENCOUNTER — Other Ambulatory Visit: Payer: Self-pay

## 2020-12-02 ENCOUNTER — Ambulatory Visit (INDEPENDENT_AMBULATORY_CARE_PROVIDER_SITE_OTHER): Payer: BC Managed Care – PPO | Admitting: Obstetrics and Gynecology

## 2020-12-02 VITALS — BP 128/84 | HR 93 | Temp 98.0°F | Wt 251.0 lb

## 2020-12-02 DIAGNOSIS — Z3480 Encounter for supervision of other normal pregnancy, unspecified trimester: Secondary | ICD-10-CM

## 2020-12-02 DIAGNOSIS — O99210 Obesity complicating pregnancy, unspecified trimester: Secondary | ICD-10-CM

## 2020-12-02 DIAGNOSIS — O09529 Supervision of elderly multigravida, unspecified trimester: Secondary | ICD-10-CM

## 2020-12-02 DIAGNOSIS — Z3A38 38 weeks gestation of pregnancy: Secondary | ICD-10-CM

## 2020-12-02 NOTE — Progress Notes (Signed)
   LOW-RISK PREGNANCY OFFICE VISIT Patient name: Jessica Banks MRN [id number]  Date of birth: 1981-03-29 Chief Complaint:   Routine Prenatal Visit  History of Present Illness:   Jessica Banks is a 39 y.o. G17P1011 female at [redacted]w[redacted]d with an Estimated Date of Delivery: 12/12/20 being seen today for ongoing management of a low-risk pregnancy.  Today she reports occasional contractions. Contractions: Irregular. Vag. Bleeding: None.  Movement: Present. denies leaking of fluid. Review of Systems:   Pertinent items are noted in HPI Denies abnormal vaginal discharge w/ itching/odor/irritation, headaches, visual changes, shortness of breath, chest pain, abdominal pain, severe nausea/vomiting, or problems with urination or bowel movements unless otherwise stated above. Pertinent History Reviewed:  Reviewed past medical,surgical, social, obstetrical and family history.  Reviewed problem list, medications and allergies. Physical Assessment:   Vitals:   12/02/20 0845  BP: 128/84  Pulse: 93  Temp: 98 F (36.7 C)  Weight: 251 lb (113.9 kg)  Body mass index is 49.02 kg/m.        Physical Examination:   General appearance: Well appearing, and in no distress  Mental status: Alert, oriented to person, place, and time  Skin: Warm & dry  Cardiovascular: Normal heart rate noted  Respiratory: Normal respiratory effort, no distress  Abdomen: Soft, gravid, nontender  Pelvic: Cervical exam performed  Dilation: 3.5 Effacement (%): 80 Station: -2  Extremities: Edema: None  Fetal Status: Fetal Heart Rate (bpm): 133 Fundal Height: 38 cm Movement: Present Presentation: Vertex  No results found for this or any previous visit (from the past 24 hour(s)).  Assessment & Plan:  1) Low-risk pregnancy G3P1011 at [redacted]w[redacted]d with an Estimated Date of Delivery: 12/12/20   2) Supervision of other normal pregnancy, antepartum - BP cuff given for monitoring BP at home - Discussed IOL expectations - Taking RRLT  and EPO BID  3) Antepartum multigravida of advanced maternal age  34) Obesity affecting pregnancy, antepartum  5) [redacted] weeks gestation of pregnancy    Meds: No orders of the defined types were placed in this encounter.  Labs/procedures today: cervical exam  Plan:  Continue routine obstetrical care   Reviewed: Term labor symptoms and general obstetric precautions including but not limited to vaginal bleeding, contractions, leaking of fluid and fetal movement were reviewed in detail with the patient.  All questions were answered. Does not own home bp cuff. BP cuff given. Check bp weekly, let us know if >140/90.   Follow-up: No follow-ups on file.  No orders of the defined types were placed in this encounter.  Laury Deep MSN, CNM 12/02/2020 9:49 AM

## 2020-12-02 NOTE — Patient Instructions (Signed)

## 2020-12-03 DIAGNOSIS — F4323 Adjustment disorder with mixed anxiety and depressed mood: Secondary | ICD-10-CM | POA: Diagnosis not present

## 2020-12-06 ENCOUNTER — Inpatient Hospital Stay (HOSPITAL_COMMUNITY): Payer: BC Managed Care – PPO | Admitting: Anesthesiology

## 2020-12-06 ENCOUNTER — Inpatient Hospital Stay (HOSPITAL_COMMUNITY)
Admission: AD | Admit: 2020-12-06 | Discharge: 2020-12-08 | DRG: 807 | Disposition: A | Discharge status: Home / Self Care | Payer: BC Managed Care – PPO | Attending: Family Medicine | Admitting: Family Medicine

## 2020-12-06 ENCOUNTER — Other Ambulatory Visit (HOSPITAL_COMMUNITY): Admission: RE | Admit: 2020-12-06 | Payer: BC Managed Care – PPO | Source: Ambulatory Visit

## 2020-12-06 ENCOUNTER — Encounter (HOSPITAL_COMMUNITY): Payer: Self-pay | Admitting: Obstetrics and Gynecology

## 2020-12-06 ENCOUNTER — Other Ambulatory Visit: Payer: Self-pay

## 2020-12-06 DIAGNOSIS — O99824 Streptococcus B carrier state complicating childbirth: Secondary | ICD-10-CM | POA: Diagnosis present

## 2020-12-06 DIAGNOSIS — O43123 Velamentous insertion of umbilical cord, third trimester: Principal | ICD-10-CM | POA: Diagnosis present

## 2020-12-06 DIAGNOSIS — Z20822 Contact with and (suspected) exposure to covid-19: Secondary | ICD-10-CM | POA: Diagnosis not present

## 2020-12-06 DIAGNOSIS — O99214 Obesity complicating childbirth: Secondary | ICD-10-CM | POA: Diagnosis present

## 2020-12-06 DIAGNOSIS — Z87891 Personal history of nicotine dependence: Secondary | ICD-10-CM | POA: Diagnosis not present

## 2020-12-06 DIAGNOSIS — D259 Leiomyoma of uterus, unspecified: Secondary | ICD-10-CM | POA: Diagnosis present

## 2020-12-06 DIAGNOSIS — O3413 Maternal care for benign tumor of corpus uteri, third trimester: Secondary | ICD-10-CM | POA: Diagnosis not present

## 2020-12-06 DIAGNOSIS — Z23 Encounter for immunization: Secondary | ICD-10-CM | POA: Diagnosis not present

## 2020-12-06 DIAGNOSIS — Z3A39 39 weeks gestation of pregnancy: Secondary | ICD-10-CM

## 2020-12-06 DIAGNOSIS — O09529 Supervision of elderly multigravida, unspecified trimester: Secondary | ICD-10-CM

## 2020-12-06 DIAGNOSIS — O26893 Other specified pregnancy related conditions, third trimester: Secondary | ICD-10-CM | POA: Diagnosis not present

## 2020-12-06 DIAGNOSIS — Z3009 Encounter for other general counseling and advice on contraception: Secondary | ICD-10-CM | POA: Diagnosis present

## 2020-12-06 DIAGNOSIS — Z3480 Encounter for supervision of other normal pregnancy, unspecified trimester: Secondary | ICD-10-CM

## 2020-12-06 DIAGNOSIS — O99210 Obesity complicating pregnancy, unspecified trimester: Secondary | ICD-10-CM | POA: Diagnosis present

## 2020-12-06 LAB — COMPREHENSIVE METABOLIC PANEL
ALT: 12 U/L (ref 0–44)
AST: 21 U/L (ref 15–41)
Albumin: 2.8 g/dL — ABNORMAL LOW (ref 3.5–5.0)
Alkaline Phosphatase: 208 U/L — ABNORMAL HIGH (ref 38–126)
Anion gap: 12 (ref 5–15)
BUN: <5 mg/dL — ABNORMAL LOW (ref 6–20)
CO2: 18 mmol/L — ABNORMAL LOW (ref 22–32)
Calcium: 9.1 mg/dL (ref 8.9–10.3)
Chloride: 104 mmol/L (ref 98–111)
Creatinine, Ser: 0.68 mg/dL (ref 0.44–1.00)
GFR, Estimated: >60 mL/min (ref >60)
Glucose, Bld: 84 mg/dL (ref 70–99)
Potassium: 3.6 mmol/L (ref 3.5–5.1)
Sodium: 134 mmol/L — ABNORMAL LOW (ref 135–145)
Total Bilirubin: 0.3 mg/dL (ref 0.3–1.2)
Total Protein: 6.6 g/dL (ref 6.5–8.1)

## 2020-12-06 LAB — RESP PANEL BY RT-PCR (FLU A&B, COVID) ARPGX2
Influenza A by PCR: NEGATIVE
Influenza B by PCR: NEGATIVE
SARS Coronavirus 2 by RT PCR: NEGATIVE

## 2020-12-06 LAB — CBC
HCT: 37.8 % (ref 36.0–46.0)
Hemoglobin: 11.5 g/dL — ABNORMAL LOW (ref 12.0–15.0)
MCH: 26.9 pg (ref 26.0–34.0)
MCHC: 30.4 g/dL (ref 30.0–36.0)
MCV: 88.3 fL (ref 80.0–100.0)
Platelets: 300 10*3/uL (ref 150–400)
RBC: 4.28 MIL/uL (ref 3.87–5.11)
RDW: 14.5 % (ref 11.5–15.5)
WBC: 8.4 10*3/uL (ref 4.0–10.5)
nRBC: 0 % (ref 0.0–0.2)

## 2020-12-06 LAB — TYPE AND SCREEN
ABO/RH(D): A POS
Antibody Screen: NEGATIVE

## 2020-12-06 LAB — RPR: RPR Ser Ql: NONREACTIVE

## 2020-12-06 MED ORDER — FENTANYL CITRATE (PF) 100 MCG/2ML IJ SOLN
100.0000 ug | INTRAMUSCULAR | Status: DC | PRN
Start: 2020-12-06 — End: 2020-12-06

## 2020-12-06 MED ORDER — ONDANSETRON HCL 4 MG/2ML IJ SOLN: 4.0000 mg | INTRAMUSCULAR | Status: DC | PRN

## 2020-12-06 MED ORDER — OXYTOCIN-SODIUM CHLORIDE 30-0.9 UT/500ML-% IV SOLN
2.5000 [IU]/h | INTRAVENOUS | Status: DC
  Filled 2020-12-06: qty 500

## 2020-12-06 MED ORDER — WITCH HAZEL-GLYCERIN EX PADS: 1.0000 "application " | MEDICATED_PAD | CUTANEOUS | Status: DC | PRN

## 2020-12-06 MED ORDER — OXYTOCIN BOLUS FROM INFUSION
333.0000 mL | Freq: Once | INTRAVENOUS | Status: AC
  Administered 2020-12-06: 10:00:00 333 mL via INTRAVENOUS

## 2020-12-06 MED ORDER — SENNOSIDES-DOCUSATE SODIUM 8.6-50 MG PO TABS
2.0000 | ORAL_TABLET | Freq: Every day | ORAL | Status: DC
  Filled 2020-12-06: qty 2

## 2020-12-06 MED ORDER — PRENATAL MULTIVITAMIN CH
1.0000 | ORAL_TABLET | Freq: Every day | ORAL | Status: DC
  Filled 2020-12-06: qty 1

## 2020-12-06 MED ORDER — OXYCODONE-ACETAMINOPHEN 5-325 MG PO TABS: 2.0000 | ORAL_TABLET | ORAL | Status: DC | PRN

## 2020-12-06 MED ORDER — SOD CITRATE-CITRIC ACID 500-334 MG/5ML PO SOLN: 30.0000 mL | ORAL | Status: DC | PRN

## 2020-12-06 MED ORDER — SIMETHICONE 80 MG PO CHEW: 80.0000 mg | CHEWABLE_TABLET | ORAL | Status: DC | PRN

## 2020-12-06 MED ORDER — ACETAMINOPHEN 325 MG PO TABS: 650.0000 mg | ORAL_TABLET | ORAL | Status: DC | PRN

## 2020-12-06 MED ORDER — ZOLPIDEM TARTRATE 5 MG PO TABS: 5.0000 mg | ORAL_TABLET | Freq: Every evening | ORAL | Status: DC | PRN

## 2020-12-06 MED ORDER — LACTATED RINGERS IV SOLN: 500.0000 mL | INTRAVENOUS | Status: DC | PRN

## 2020-12-06 MED ORDER — OXYTOCIN 10 UNIT/ML IJ SOLN: 10.0000 [IU] | Freq: Once | INTRAMUSCULAR | Status: DC | PRN

## 2020-12-06 MED ORDER — OXYTOCIN-SODIUM CHLORIDE 30-0.9 UT/500ML-% IV SOLN: 1.0000 m[IU]/min | INTRAVENOUS | Status: DC

## 2020-12-06 MED ORDER — DIPHENHYDRAMINE HCL 50 MG/ML IJ SOLN: 12.5000 mg | INTRAMUSCULAR | Status: DC | PRN

## 2020-12-06 MED ORDER — LACTATED RINGERS IV SOLN: INTRAVENOUS | Status: DC

## 2020-12-06 MED ORDER — COCONUT OIL OIL: 1.0000 "application " | TOPICAL_OIL | Status: DC | PRN

## 2020-12-06 MED ORDER — LIDOCAINE HCL (PF) 1 % IJ SOLN: 30.0000 mL | INTRAMUSCULAR | Status: DC | PRN

## 2020-12-06 MED ORDER — EPHEDRINE 5 MG/ML INJ: 10.0000 mg | INTRAVENOUS | Status: DC | PRN

## 2020-12-06 MED ORDER — LIDOCAINE HCL (PF) 1 % IJ SOLN
INTRAMUSCULAR | Status: DC | PRN
  Administered 2020-12-06 (×2): 5 mL via EPIDURAL

## 2020-12-06 MED ORDER — ONDANSETRON HCL 4 MG PO TABS: 4.0000 mg | ORAL_TABLET | ORAL | Status: DC | PRN

## 2020-12-06 MED ORDER — PHENYLEPHRINE 40 MCG/ML (10ML) SYRINGE FOR IV PUSH (FOR BLOOD PRESSURE SUPPORT): 80.0000 ug | PREFILLED_SYRINGE | INTRAVENOUS | Status: DC | PRN

## 2020-12-06 MED ORDER — OXYCODONE-ACETAMINOPHEN 5-325 MG PO TABS: 1.0000 | ORAL_TABLET | ORAL | Status: DC | PRN

## 2020-12-06 MED ORDER — DIPHENHYDRAMINE HCL 25 MG PO CAPS: 25.0000 mg | ORAL_CAPSULE | Freq: Four times a day (QID) | ORAL | Status: DC | PRN

## 2020-12-06 MED ORDER — IBUPROFEN 600 MG PO TABS
600.0000 mg | ORAL_TABLET | Freq: Four times a day (QID) | ORAL | Status: DC
Start: 2020-12-06 — End: 2020-12-08
  Administered 2020-12-06 – 2020-12-07 (×6): 600 mg via ORAL
  Filled 2020-12-06 (×7): qty 1

## 2020-12-06 MED ORDER — SODIUM CHLORIDE (PF) 0.9 % IJ SOLN
INTRAMUSCULAR | Status: DC | PRN
  Administered 2020-12-06: 12 mL/h via EPIDURAL

## 2020-12-06 MED ORDER — FENTANYL-BUPIVACAINE-NACL 0.5-0.125-0.9 MG/250ML-% EP SOLN
EPIDURAL | Status: AC
  Filled 2020-12-06: qty 250

## 2020-12-06 MED ORDER — ONDANSETRON HCL 4 MG/2ML IJ SOLN: 4.0000 mg | Freq: Four times a day (QID) | INTRAMUSCULAR | Status: DC | PRN

## 2020-12-06 MED ORDER — SODIUM CHLORIDE 0.9 % IV SOLN
2.0000 g | Freq: Once | INTRAVENOUS | Status: AC
  Administered 2020-12-06: 09:00:00 2 g via INTRAVENOUS
  Filled 2020-12-06: qty 2000

## 2020-12-06 MED ORDER — FENTANYL-BUPIVACAINE-NACL 0.5-0.125-0.9 MG/250ML-% EP SOLN: 12.0000 mL/h | EPIDURAL | Status: DC | PRN

## 2020-12-06 MED ORDER — ACETAMINOPHEN 325 MG PO TABS
650.0000 mg | ORAL_TABLET | ORAL | Status: DC | PRN
  Administered 2020-12-06: 650 mg via ORAL
  Filled 2020-12-06 (×2): qty 2

## 2020-12-06 MED ORDER — LACTATED RINGERS IV SOLN
500.0000 mL | Freq: Once | INTRAVENOUS | Status: AC
  Administered 2020-12-06: 09:00:00 500 mL via INTRAVENOUS

## 2020-12-06 MED ORDER — TERBUTALINE SULFATE 1 MG/ML IJ SOLN: 0.2500 mg | Freq: Once | INTRAMUSCULAR | Status: DC | PRN

## 2020-12-06 MED ORDER — DIBUCAINE (PERIANAL) 1 % EX OINT: 1.0000 "application " | TOPICAL_OINTMENT | CUTANEOUS | Status: DC | PRN

## 2020-12-06 MED ORDER — SODIUM CHLORIDE 0.9 % IV SOLN: 1.0000 g | INTRAVENOUS | Status: DC

## 2020-12-06 NOTE — Anesthesia Postprocedure Evaluation (Signed)
Anesthesia Post Note  Patient: [name]  Procedure(s) Performed: AN AD HOC LABOR EPIDURAL     Patient location during evaluation: Mother Baby Anesthesia Type: Epidural Level of consciousness: awake and alert and oriented Pain management: satisfactory to patient Vital Signs Assessment: post-procedure vital signs reviewed and stable Respiratory status: spontaneous breathing and nonlabored ventilation Cardiovascular status: stable Postop Assessment: no headache, no backache, no signs of nausea or vomiting, adequate PO intake, patient able to bend at knees and able to ambulate (patient up walking) Anesthetic complications: no   No complications documented.  Last Vitals:  Vitals:   12/06/20 1340 12/06/20 1740  BP: 111/84 122/74  Pulse: 84 81  Resp: 17 18  Temp: 37.1 C 36.9 C  SpO2:      Last Pain:  Vitals:   12/06/20 1740  TempSrc: Oral  PainSc:    Pain Goal: Patients Stated Pain Goal: 8 (12/06/20 0839)                 Willa Rough

## 2020-12-06 NOTE — Anesthesia Preprocedure Evaluation (Signed)
Anesthesia Evaluation  Patient identified by MRN, date of birth, ID band Patient awake    Reviewed: Allergy & Precautions, H&P , NPO status , Patient's Chart, lab work & pertinent test results  Airway Mallampati: III  TM Distance: >3 FB Neck ROM: Full    Dental no notable dental hx. (+) Dental Advisory Given   Pulmonary neg pulmonary ROS, former smoker,    Pulmonary exam normal        Cardiovascular negative cardio ROS Normal cardiovascular exam     Neuro/Psych PSYCHIATRIC DISORDERS Anxiety Depression negative neurological ROS     GI/Hepatic Neg liver ROS, GERD  ,  Endo/Other  Morbid obesity  Renal/GU negative Renal ROS  negative genitourinary   Musculoskeletal negative musculoskeletal ROS (+)   Abdominal   Peds negative pediatric ROS (+)  Hematology negative hematology ROS (+)   Anesthesia Other Findings   Reproductive/Obstetrics negative OB ROS                             Anesthesia Physical Anesthesia Plan  ASA: III  Anesthesia Plan: Epidural   Post-op Pain Management:    Induction:   PONV Risk Score and Plan:   Airway Management Planned: Natural Airway  Additional Equipment:   Intra-op Plan:   Post-operative Plan:   Informed Consent: I have reviewed the patients History and Physical, chart, labs and discussed the procedure including the risks, benefits and alternatives for the proposed anesthesia with the patient or authorized representative who has indicated his/her understanding and acceptance.     Dental advisory given  Plan Discussed with: Anesthesiologist  Anesthesia Plan Comments: (Pt received labor CLE in 2018, given Marcaine and Lidocaine without allergic reaction.)        Anesthesia Quick Evaluation

## 2020-12-06 NOTE — Progress Notes (Signed)
During assessment pt stated she was having "lightening strike pain in her right lower back with movement" and rating it a 8/10. Pt still has epidural placed PP since she is having BTL tomorrow afternoon. RN notified Dr. Elgie Congo and received a verbal order to removed the epidural since it would of been placed for greater than 24 hours. RN will call L&D nurse to come remove epidural catheter.    Marguerita Beards, RN

## 2020-12-06 NOTE — H&P (Addendum)
OBSTETRIC ADMISSION HISTORY AND PHYSICAL  Jessica Banks is a 39 y.o. female G3P1011 with IUP at [redacted]w[redacted]d by LMP and early U/S presenting for labor. Contractions started about 0400 at every 10 mins; now every 4 mins on the way to the hospital. She reports a small leak of clear fluid just after she urinated and was about to get up from the toilet.  Reports fetal movement. Denies vaginal bleeding.  She [facility] her prenatal [facility]  Support person in labor: Ollen Gross (spouse) and Precious Leory Plowman (doula)  Ultrasounds . Anatomy U/S: Normal  Marginal cord insertion  Prenatal History/Complications: . AMA . Obesity  OB BOX:  Nursing Staff Provider  Office Location [facility]) Dating  Ultrasound  Language  English Anatomy US  Normal  Flu Vaccine  09/23/20 Genetic Screen  NIPS: Low Risk Female AFP: Negative  First Screen:  Quad:    TDaP Vaccine  10/28/20 Hgb A1C or  GTT Early 5.8, Normal early 1 hr GTT Third trimester  Glucose, Fasting 65 - 91 mg/dL 75   Glucose, 1 hour 65 - 179 mg/dL 151   Glucose, 2 hour 65 - 152 mg/dL 114     COVID Vaccine 12/202 & 12/2019   LAB RESULTS   Rhogam  N/A Blood Type A/Positive/-- (05/25 0000)   Feeding Plan Breast Antibody Negative (05/25 0000)  Contraception BTL Rubella Immune (05/25 0000)  Circumcision N/A RPR Nonreactive (05/25 0000)   Pediatrician  [facility] Dr. Suzan Slick HBsAg Negative (05/25 0000)   Support Person Husband - Alvin HCVAb Negative  Prenatal Classes N/A HIV Non-reactive (05/25 0000)     BTL Consent N/A GBS  POSITIVE (For PCN allergy, check sensitivities)   VBAC Consent N/A Pap Normal (05/2020)    Hgb Electro  Negative  BP Cuff Home CF   Weight Scale Home SMA     Waterbirth  [ ]  Class [ ]  Consent [ ]  CNM visit    Induction  [x]  Orders Entered [x] Foley Not needed   Past Medical History: Past Medical History:  Diagnosis Date  . Abnormal Pap smear    CIN I  . Anemia   . Anxiety   . Depression     off meds now, ok now  . Enlarged thyroid gland    nromal function  . GERD (gastroesophageal reflux disease)   . Migraine   . Obesity   . Ovarian cyst   . Vaginal Pap smear, abnormal    ok now    Past Surgical History: Past Surgical History:  Procedure Laterality Date  . LAPAROSCOPIC GASTRIC SLEEVE RESECTION      Obstetrical History: OB History    Gravida  3   Para  1   Term  1   Preterm      AB  1   Living  1     SAB  1   IAB      Ectopic      Multiple  0   Live Births  1           Social History: Social History   Socioeconomic History  . Marital status: Married    Spouse name: n/a  . Number of children: 0  . Years of education: master's  . Highest education level: Master's degree (e.g., MA, MS, MEng, MEd, MSW, MBA)  Occupational History  . Occupation: RN    Comment: [employer]  . Occupation: NP    Comment: graduated/boards spring 2014  Tobacco Use  .  Smoking status: Former Smoker    Years: 4.00    Types: Cigarettes    Quit date: 06/07/2013    Years since quitting: 7.5  . Smokeless tobacco: Never Used  . Tobacco comment: only smoked on the weekends  Vaping Use  . Vaping Use: Never used  Substance and Sexual Activity  . Alcohol use: Not Currently    Comment: SOCIAL   . Drug use: No  . Sexual activity: Not Currently  Other Topics Concern  . Not on file  Social History Narrative  . Not on file   Social Determinants of Health   Financial Resource Strain: Not on file  Food Insecurity: Not on file  Transportation Needs: Not on file  Physical Activity: Not on file  Stress: Not on file  Social Connections: Not on file    Family History: Family History  Problem Relation Age of Onset  . Thyroid disease Mother        s/p thyroidectomy  . Arthritis Mother   . Leukemia Maternal Grandmother   . Stroke Maternal Grandmother   . Cancer Maternal Grandfather        lung  . Heart disease Paternal Grandmother   . COPD  Paternal Grandfather   . Hypertension Maternal Uncle   . Diabetes Maternal Uncle     Allergies: Allergies  Allergen Reactions  . Novocain [Procaine] Anaphylaxis    Medications Prior to Admission  Medication Sig Dispense Refill Last Dose  . butalbital-acetaminophen-caffeine (FIORICET) 50-325-40 MG tablet Take 1-2 tablets by mouth every 4 (four) hours as needed. 30 tablet 0   . DEXILANT 60 MG capsule TAKE 1 CAPSULE BY MOUTH EVERY DAY 30 capsule 3   . docusate sodium (COLACE) 100 MG capsule Take 100 mg by mouth daily.     Marland Kitchen doxylamine, Sleep, (UNISOM) 25 MG tablet Take 25 mg by mouth at bedtime as needed.     . ondansetron (ZOFRAN) 8 MG tablet Take 8 mg by mouth every 8 (eight) hours as needed for nausea or vomiting.     . Prenatal Vit-Fe Fumarate-FA (MULTIVITAMIN-PRENATAL) 27-0.8 MG TABS tablet Take 1 tablet by mouth daily at 12 noon.     . promethazine (PHENERGAN) 25 MG suppository Place 25 mg rectally every 6 (six) hours as needed for nausea or vomiting.        Review of Systems  All systems reviewed and negative except as stated in HPI  Patient Vitals for the past 24 hrs:  BP Temp Temp src Pulse Resp SpO2 Height Weight  12/06/20 0809 135/85 -- -- 89 -- -- -- --  12/06/20 0753 (!) 141/87 (!) 97.5 F (36.4 C) Oral 90 18 100 % -- --  12/06/20 0747 -- -- -- -- -- -- 5' (1.524 m) 114.2 kg     General appearance: alert, cooperative, moderate distress and mildly obese Lungs: no respiratory distress Heart: regular rate  Abdomen: soft, non-tender; gravid Pelvic: adequate gynecoid Extremities: Homans sign is negative, no sign of DVT Presentation: cephalic Fetal monitoring: 125 bpm  moderate variability  no accels  no decels Uterine activity:  Regular every 4 mintues Dilation: 5 Effacement (%): 80 Station: -1 Exam by:: Amika Tassin cnm Fern Slide obtained prior to exam  forebag felt with exam  Prenatal labs: ABO, Rh: A POS Antibody: Negative Rubella: Immune (05/25 0000) RPR:  Non Reactive (10/07 0815)  HBsAg: Negative (05/25 0000)  HIV: Non Reactive (10/07 0815)  GBS: Positive/-- (12/03 0931)  Glucola:  Glucose, Fasting 65 - 91 mg/dL  75   Glucose, 1 hour 65 - 179 mg/dL 151   Glucose, 2 hour 65 - 152 mg/dL 114    Genetic screening:  normal  Prenatal Transfer Tool  Maternal Diabetes: No Genetic Screening: Normal Maternal Ultrasounds/Referrals: Normal Fetal Ultrasounds or other Referrals:  None Maternal Substance Abuse:  No Significant Maternal Medications:  None Significant Maternal Lab Results: Group B Strep positive  Results for orders placed or performed during the hospital encounter of 12/06/20 (from the past 24 hour(s))  Type and screen   Collection Time: 12/06/20  8:00 AM  Result Value Ref Range   ABO/RH(D) PENDING    Antibody Screen PENDING    Sample Expiration      12/09/2020,2359 Performed at [facility], [place] [address], [place], [place] [address]     Patient Active Problem List   Diagnosis Date Noted  . 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 Unwanted fertility 11/26/2020  . Obesity affecting pregnancy, antepartum 09/01/2020  . Supervision of other normal pregnancy, antepartum 08/30/2020  . Antepartum multigravida of advanced maternal age 38/13/2021  . Near syncope 07/16/2017  . Anxiety   . Migraine   . Obesity     Assessment/Plan:  NEESHA LANGTON is a 39 y.o. G3P1011 at [redacted]w[redacted]d here for active labor, possible rupture of membranes. (+) Fern with forebag palpable.  Labor: active -- pain control: labor support/doula  Fetal Wellbeing: EFW 7.5 lbs by Leopold's. Cephalic by SVE.  -- GBS (+) -- continuous fetal monitoring   Postpartum Planning -- breast -- [x] Tdap    Laury Deep, CNM  12/06/2020, 7:56 AM

## 2020-12-06 NOTE — Discharge Summary (Signed)
Postpartum Discharge Summary      Patient Name: Jessica Banks DOB: 05/27/1981 MRN: [id number]  Date of admission: 12/06/2020 Delivery date:12/06/2020  Delivering provider: Laury Deep  Date of discharge: 12/08/2020  Admitting diagnosis: Encounter for elective induction of labor [Z34.90] Intrauterine pregnancy: [redacted]w[redacted]d    Secondary diagnosis:  Active Problems:   Supervision of other normal pregnancy, antepartum   Antepartum multigravida of advanced maternal age   Obesity affecting pregnancy, antepartum   Unwanted fertility   Uterine myoma  Additional problems: none    Discharge diagnosis: Term Pregnancy Delivered                                              Post partum procedures:none Augmentation: AROM Complications: None  Hospital course: Onset of Labor With Vaginal Delivery      39y.o. yo G3P1011 at 364w1das admitted in Active Labor on 12/06/2020. Patient had an uncomplicated labor course as follows:  Membrane Rupture Time/Date: 8:00 AM ,12/06/2020   Delivery Method:Vaginal, Spontaneous  Episiotomy: None  Lacerations:  None  Patient had an uncomplicated postpartum course.  She is ambulating, tolerating a regular diet, passing flatus, and urinating well. Patient is discharged home in stable condition on 12/08/20.  Newborn Data: Birth date:12/06/2020  Birth time:10:10 AM  Gender:Female  Living status:Living  Apgars:8 ,9  Weight:3280 g   Magnesium Sulfate received: No BMZ received: No Rhophylac:N/A MMR:Yes T-DaP:Given prenatally Flu: Yes Transfusion:No  Physical exam  Vitals:   12/07/20 0645 12/07/20 1452 12/07/20 2127 12/08/20 0525  BP: 125/81 108/66 129/80 (!) 129/95  Pulse: 80 79 79 69  Resp: _0 Temp: 98.4 F (36.9 C) 98.7 F (37.1 C) 98 F (36.7 C) 98 F (36.7 C)  TempSrc: Oral Oral Oral Oral  SpO2: 100% 100% 100% 99%  Weight:      Height:       General: alert, cooperative and no distress Lochia: appropriate Uterine Fundus:  firm Incision: N/A DVT Evaluation: No evidence of DVT seen on physical exam. Negative Homan's sign. No cords or calf tenderness. No significant calf/ankle edema.  Labs: Lab Results  Component Value Date   WBC 8.4 12/06/2020   HGB 11.5 (L) 12/06/2020   HCT 37.8 12/06/2020   MCV 88.3 12/06/2020   PLT 300 12/06/2020   CMP Latest Ref Rng & Units 12/06/2020  Glucose 70 - 99 mg/dL 84  BUN 6 - 20 mg/dL <5(L)  Creatinine 0.44 - 1.00 mg/dL 0.68  Sodium 135 - 145 mmol/L 134(L)  Potassium 3.5 - 5.1 mmol/L 3.6  Chloride 98 - 111 mmol/L 104  CO2 22 - 32 mmol/L 18(L)  Calcium 8.9 - 10.3 mg/dL 9.1  Total Protein 6.5 - 8.1 g/dL 6.6  Total Bilirubin 0.3 - 1.2 mg/dL 0.3  Alkaline Phos 38 - 126 U/L 208(H)  AST 15 - 41 U/L 21  ALT 0 - 44 U/L 12   Edinburgh Score: Edinburgh Postnatal Depression Scale Screening Tool 12/07/2020  I have been able to laugh and see the funny side of things. 0  I have looked forward with enjoyment to things. 0  I have blamed myself unnecessarily when things went wrong. 1  I have been anxious or worried for no good reason. 1  I have felt scared or panicky for no good reason. 1  Things have been getting on top of me.  1  I have been so unhappy that I have had difficulty sleeping. 0  I have felt sad or miserable. 1  I have been so unhappy that I have been crying. 1  The thought of harming myself has occurred to me. 0  Edinburgh Postnatal Depression Scale Total 6     After visit meds:  Allergies as of 12/08/2020      Reactions   Novocain [procaine] Anaphylaxis      Medication List    TAKE these medications   butalbital-acetaminophen-caffeine 50-325-40 MG tablet Commonly known as: FIORICET Take 1-2 tablets by mouth every 4 (four) hours as needed.   Dexilant 60 MG capsule Generic drug: dexlansoprazole TAKE 1 CAPSULE BY MOUTH EVERY DAY   docusate sodium 100 MG capsule Commonly known as: COLACE Take 100 mg by mouth daily.   doxylamine (Sleep) 25 MG  tablet Commonly known as: UNISOM Take 25 mg by mouth at bedtime as needed.   multivitamin-prenatal 27-0.8 MG Tabs tablet Take 1 tablet by mouth daily at 12 noon.   ondansetron 8 MG tablet Commonly known as: ZOFRAN Take 8 mg by mouth every 8 (eight) hours as needed for nausea or vomiting.   promethazine 25 MG suppository Commonly known as: PHENERGAN Place 25 mg rectally every 6 (six) hours as needed for nausea or vomiting.        Discharge home in stable condition Infant Feeding: Breast Infant Disposition:home with mother Discharge instruction: per After Visit Summary and Postpartum booklet. Activity: Advance as tolerated. Pelvic rest for 6 weeks.  Diet: routine diet Future Appointments: Future Appointments  Date Time Provider [place]  01/06/2021  9:30 AM Laury Deep, CNM [facility] None  03/03/2021  9:30 AM Glendale Chard, MD [facility] None   Follow up Visit:  Follow-up Information    [place]. Go on 01/06/2021.   Specialty: Obstetrics and Gynecology Why: At 9:30 AM for postpartum visit Contact information: [place] [phone]               Please schedule this patient for a Virtual postpartum visit in 4 weeks with the following provider: APP. Additional Postpartum F/U:none  Low risk pregnancy complicated by: obesity, AMA Delivery mode:  Vaginal, Spontaneous  Anticipated Birth Control:  husband planning vasectomy   12/08/2020 Laury Deep, CNM

## 2020-12-06 NOTE — Discharge Instructions (Signed)
Please call the office for any problems or concerns before your scheduled appointment. You may also use the My Chart feature for non-emergency needs.

## 2020-12-06 NOTE — Lactation Note (Signed)
This note was copied from a baby's chart. Lactation Consultation Note  Patient Name: Jessica Banks Date: 12/06/2020 Reason for consult: Initial assessment  Initial visit to 3 hours old infant of a P2 mother with 35-months of exclusive pumping and bottlefeeding experience. RN is proactively assisting mother to latch upon arrival. [facility] assisted with support pillows, positioning and getting infant awake. Talked to mother about hand expression and demonstrated technique. Colostrum easily expressed. Assisted with latch football position to left breast. Demonstrated alignment. Latched infant with ease.  Infant sucking rhythmically with flanged lips, observed swallowing and breast tissue moving. Mother denies pain or discomfort.  Discussed milk coming to volume. Reviewed newborn behavior and expectations during first days of life.    Plan: 1-Breastfeeding on demand, ensuring a deep, comfortable latch.  2-Offer breast 8-12 times in 24h period to establish good milk supply. 3-Undressing infant and place skin to skin when ready to breastfeed 4-Keep infant awake during breastfeeding session: massaging breast, infant's hand/shoulder/feet 5-Monitor voids and stools as signs good intake.  6-Encouraged maternal rest, hydration and food intake.  7-Contact LC as needed for feeds/support/concerns/questions   All questions answered at this time. Provided Lactation services brochure and promoted [employer] booklet information.    Maternal Data Formula Feeding for Exclusion: No Has patient been taught Hand Expression?: Yes Does the patient have breastfeeding experience prior to this delivery?: Yes  Feeding Feeding Type: Breast Fed  LATCH Score Latch: Grasps breast easily, tongue down, lips flanged, rhythmical sucking.  Audible Swallowing: Spontaneous and intermittent  Type of Nipple: Everted at rest and after stimulation  Comfort (Breast/Nipple): Soft / non-tender  Hold (Positioning):  Assistance needed to correctly position infant at breast and maintain latch.  LATCH Score: 9  Interventions Interventions: Breast feeding basics reviewed;Assisted with latch;Skin to skin;Breast massage;Hand express;Adjust position;Support pillows;Position options;Expressed milk  Lactation Tools Discussed/Used     Consult Status Consult Status: Follow-up Date: 12/07/20 Follow-up type: In-patient    Kaz Auld A Higuera Ancidey 12/06/2020, 2:06 PM

## 2020-12-06 NOTE — Social Work (Signed)
MOB was referred for history of depression and anxiety.   * Referral screened out by Clinical Social Worker because none of the following criteria appear to apply:  ~ History of anxiety/depression during this pregnancy, or of post-partum depression following prior delivery. ~ Diagnosis of anxiety and/or depression within last 3 years. Per chart, MOB initially diagnosed 2017/2018. OR * MOB's symptoms currently being treated with medication and/or therapy.  Please contact the Clinical Social Worker if needs arise, by [facility] request, or if MOB scores greater than 9/yes to question 10 on Edinburgh Postpartum Depression Screen.  Darra Lis, [place] Work [employer]  [phone]

## 2020-12-06 NOTE — Anesthesia Procedure Notes (Signed)
Epidural Patient location during procedure: OB Start time: 12/06/2020 9:07 AM End time: 12/06/2020 9:21 AM  Staffing Anesthesiologist: Duane Boston, MD Performed: anesthesiologist   Preanesthetic Checklist Completed: patient identified, IV checked, site marked, risks and benefits discussed, monitors and equipment checked, pre-op evaluation and timeout performed  Epidural Patient position: sitting Prep: DuraPrep Patient monitoring: heart rate, cardiac monitor, continuous pulse ox and blood pressure Approach: midline Location: L2-L3 Injection technique: LOR saline  Needle:  Needle type: Tuohy  Needle gauge: 17 G Needle length: 9 cm Needle insertion depth: 7 cm Catheter size: 20 Guage Catheter at skin depth: 12 cm Test dose: negative and Other  Assessment Events: blood not aspirated, injection not painful, no injection resistance and negative IV test  Additional Notes Informed consent obtained prior to proceeding including risk of failure, 1% risk of PDPH, risk of minor discomfort and bruising.  Discussed rare but serious complications including epidural abscess, permanent nerve injury, epidural hematoma.  Discussed alternatives to epidural analgesia and patient desires to proceed.  Timeout performed pre-procedure verifying patient name, procedure, and platelet count.  Patient tolerated procedure well.

## 2020-12-06 NOTE — Lactation Note (Signed)
This note was copied from a baby's chart. Lactation Consultation Note  Patient Name: Girl Tashawnda Bleiler [id number] Date: 12/06/2020 Reason for consult: Initial assessment;Term;Other (Comment)  Baby less than 60 mins at the LD initially [facility] visit.  Per mom and dad baby had fed 30 mins soon after birth. When [place] arrived baby rooting and LC offered to position baby near the nipple and  Baby latched with assist to obtain depth and fed for 10 mins. ( see doc flow sheets )  Mom shared her 1st baby had a difficult time latching and she pumped for 11 months.     Maternal Data Has patient been taught Hand Expression?: Yes Does the patient have breastfeeding experience prior to this delivery?: Yes  Feeding Feeding Type: Breast Fed  LATCH Score Latch: Grasps breast easily, tongue down, lips flanged, rhythmical sucking.  Audible Swallowing: None  Type of Nipple: Everted at rest and after stimulation  Comfort (Breast/Nipple): Soft / non-tender  Hold (Positioning): Assistance needed to correctly position infant at breast and maintain latch.  LATCH Score: 7  Interventions Interventions: Breast feeding basics reviewed;Assisted with latch;Adjust position  Lactation Tools Discussed/Used     Consult Status Consult Status: Follow-up Date: 12/06/20 Follow-up type: In-patient    [place] 12/06/2020, 11:28 AM

## 2020-12-06 NOTE — MAU Note (Signed)
ctxs woke her at 4, were every 22min.  On the way here went to q4 min, feels pressure, denies bleeding or leaking. Was 3+cm when last checked.  +GBS, G3p1

## 2020-12-06 NOTE — Progress Notes (Signed)
Patient ID: Jessica Banks, female   DOB: Mar 16, 1981, 39 y.o.   MRN: [id number]  Epidural placed recently; feeling some pressure w ctx but not a strong urge to push; had BP 130/102 while at bedside during ctx; Amp x 1 dose given  BPs 106/60, 104/59 FHR 130s, +accels, occ mi variables Ctx q 2-3 mins Cx 7/90/0 after epid placement  CMP neg  IUP@39 .1wk Active labor GBS pos  Expectant management for now; she will notify if she feels the urge to push; plan to check cx in 1-2 hrs; anticipate vag del  Myrtis Ser [facility] 12/06/2020

## 2020-12-07 ENCOUNTER — Inpatient Hospital Stay (HOSPITAL_COMMUNITY)
Admission: AD | Admit: 2020-12-07 | Payer: BC Managed Care – PPO | Source: Home / Self Care | Admitting: Obstetrics and Gynecology

## 2020-12-07 ENCOUNTER — Inpatient Hospital Stay (HOSPITAL_COMMUNITY): Payer: BC Managed Care – PPO

## 2020-12-07 ENCOUNTER — Encounter (HOSPITAL_COMMUNITY)
Admission: AD | Disposition: A | Discharge status: Home / Self Care | Payer: Self-pay | Source: Home / Self Care | Attending: Family Medicine

## 2020-12-07 SURGERY — LIGATION, FALLOPIAN TUBE, POSTPARTUM
Anesthesia: Choice

## 2020-12-07 NOTE — Progress Notes (Signed)
Pt has decided to cancel her BLT. CNM has been notified.   Marguerita Beards, RN

## 2020-12-07 NOTE — Lactation Note (Signed)
This note was copied from a baby's chart. Lactation Consultation Note  Patient Name: Jessica Banks [id number] Date: 12/07/2020 Reason for consult: Follow-up assessment  P2 mother whose infant is now 50 hours old.  This is a term baby at 39+1 weeks.  Mother pumped and bottle fed her first child (now 38 years old) for 11 months.  Baby was asleep and not showing feeding cues when I arrived.  However, mother was interested in trying the cross cradle hold for the first time so she awakened infant.  Reminded to feed STS and remove clothing.  Mother demonstrated hand expression; revised her technique and she was able to express a couple of colostrum drops which I finger fed to baby.  Assisted to latch in the cross cradle hold on the right breast easily.  With gentle stimulation, baby was able to begin sucking.  Observed her feeding for 7 minutes before she pulled off.  Mother placed her STS and she fell asleep.  Reviewed breast feeding basics including cluster feeding.  Father will assist as needed.  Encouraged mother to call for latch assist if she has difficulty tonight.  Mother verbalized understanding.  She has a DEBP for home use.   Maternal Data    Feeding Feeding Type: Breast Fed  LATCH Score Latch: Repeated attempts needed to sustain latch, nipple held in mouth throughout feeding, stimulation needed to elicit sucking reflex.  Audible Swallowing: None  Type of Nipple: Everted at rest and after stimulation  Comfort (Breast/Nipple): Soft / non-tender  Hold (Positioning): Assistance needed to correctly position infant at breast and maintain latch.  LATCH Score: 6  Interventions    Lactation Tools Discussed/Used     Consult Status Consult Status: Follow-up Date: 12/08/20 Follow-up type: In-patient    Little Ishikawa 12/07/2020, 4:17 PM

## 2020-12-07 NOTE — Progress Notes (Signed)
Post Partum Day 1 Subjective: Jessica Banks  is a 39 y.o. I4P3295 s/p SVD at [redacted]w[redacted]d.  She reports she is doing well. No acute events overnight. She denies any problems with ambulating, voiding or po intake. Denies nausea or vomiting.  Pain is well controlled on ibuprofen.  Lochia is moderate and improving.  Objective: Blood pressure 125/81, pulse 80, temperature 98.4 F (36.9 C), temperature source Oral, resp. rate 18, height 5' (1.524 m), weight 114.2 kg, last menstrual period 02/08/2020, SpO2 100 %, unknown if currently breastfeeding.  Physical Exam:  General: alert, cooperative, appears stated age and no distress Lochia: appropriate Uterine Fundus: firm Incision: n/a Laceration: superficial vaginal and bilateral periurethral - healing well per patient DVT Evaluation: No evidence of DVT seen on physical exam. No significant calf/ankle edema.  Recent Labs    12/06/20 0804  HGB 11.5*  HCT 37.8    Assessment/Plan: Plan for discharge tomorrow, Breastfeeding and Contraception vasectomy   LOS: 1 day   Valli Glance 12/07/2020, 9:21 AM

## 2020-12-08 NOTE — Lactation Note (Signed)
This note was copied from a baby's chart. Lactation Consultation Note  Patient Name: Jessica Banks [id number] Date: 12/08/2020 Reason for consult: Follow-up assessment  P2 mother whose infant is now 52 hours old.  This is a term baby at 39+1 weeks.  Mother pumped and bottle fed her first child (now 38 years old) for 11 months.  Mother mentioned today that she actually did breast feed a little bit but remembers that her son did not latch well.  Baby was awake and showing cues when I arrived.  Suggested mother breast feed again.  Baby has been cluster feeding all night long.  Observed mother latching and feeding baby:  LATCH score of a 10.  Reviewed breast feeding basics from yesterday.  Parents had some questions.  Discussed pumping and pacifier use.  Parents very knowledgeable and caring; work well together.    Mother will continue to feed 8-12 times/24 hours or sooner if baby shows cues.  Engorgement prevention/treatment discussed.  Mother has a manual pump and a DEBP for home use.  Fitted mother with a #27 flange size instead of the #24 flange size for better fit and comfort.  Mother's breasts are filling today and she can easily obtain EBM.   Mother has our OP phone number for any concerns after discharge.  RN updated.   Maternal Data    Feeding Feeding Type: Breast Fed  LATCH Score Latch: Grasps breast easily, tongue down, lips flanged, rhythmical sucking.  Audible Swallowing: Spontaneous and intermittent  Type of Nipple: Everted at rest and after stimulation  Comfort (Breast/Nipple): Soft / non-tender  Hold (Positioning): No assistance needed to correctly position infant at breast.  LATCH Score: 10  Interventions Interventions: Breast feeding basics reviewed;Skin to skin  Lactation Tools Discussed/Used     Consult Status Consult Status: Complete Date: 12/08/20 Follow-up type: Call as needed    Vauda Salvucci R Jasier Calabretta 12/08/2020, 8:45 AM

## 2020-12-09 ENCOUNTER — Encounter: Payer: BC Managed Care – PPO | Admitting: Obstetrics and Gynecology

## 2020-12-12 ENCOUNTER — Inpatient Hospital Stay (HOSPITAL_COMMUNITY)
Admission: AD | Admit: 2020-12-12 | Discharge: 2020-12-12 | Disposition: A | Discharge status: Home / Self Care | Payer: BC Managed Care – PPO | Attending: Obstetrics and Gynecology | Admitting: Obstetrics and Gynecology

## 2020-12-12 ENCOUNTER — Other Ambulatory Visit: Payer: Self-pay

## 2020-12-12 ENCOUNTER — Encounter (HOSPITAL_COMMUNITY): Payer: Self-pay | Admitting: Obstetrics and Gynecology

## 2020-12-12 DIAGNOSIS — O99893 Other specified diseases and conditions complicating puerperium: Secondary | ICD-10-CM

## 2020-12-12 DIAGNOSIS — Z884 Allergy status to anesthetic agent status: Secondary | ICD-10-CM | POA: Diagnosis not present

## 2020-12-12 DIAGNOSIS — Z87891 Personal history of nicotine dependence: Secondary | ICD-10-CM | POA: Diagnosis not present

## 2020-12-12 DIAGNOSIS — O9089 Other complications of the puerperium, not elsewhere classified: Secondary | ICD-10-CM | POA: Insufficient documentation

## 2020-12-12 DIAGNOSIS — G8929 Other chronic pain: Secondary | ICD-10-CM | POA: Diagnosis not present

## 2020-12-12 DIAGNOSIS — Z79899 Other long term (current) drug therapy: Secondary | ICD-10-CM | POA: Diagnosis not present

## 2020-12-12 DIAGNOSIS — R519 Headache, unspecified: Secondary | ICD-10-CM | POA: Diagnosis not present

## 2020-12-12 DIAGNOSIS — O165 Unspecified maternal hypertension, complicating the puerperium: Secondary | ICD-10-CM

## 2020-12-12 LAB — CBC
HCT: 36.3 % (ref 36.0–46.0)
Hemoglobin: 11 g/dL — ABNORMAL LOW (ref 12.0–15.0)
MCH: 27 pg (ref 26.0–34.0)
MCHC: 30.3 g/dL (ref 30.0–36.0)
MCV: 89 fL (ref 80.0–100.0)
Platelets: 296 10*3/uL (ref 150–400)
RBC: 4.08 MIL/uL (ref 3.87–5.11)
RDW: 14.3 % (ref 11.5–15.5)
WBC: 7.1 10*3/uL (ref 4.0–10.5)
nRBC: 0 % (ref 0.0–0.2)

## 2020-12-12 LAB — COMPREHENSIVE METABOLIC PANEL
ALT: 12 U/L (ref 0–44)
AST: 17 U/L (ref 15–41)
Albumin: 2.8 g/dL — ABNORMAL LOW (ref 3.5–5.0)
Alkaline Phosphatase: 153 U/L — ABNORMAL HIGH (ref 38–126)
Anion gap: 10 (ref 5–15)
BUN: 11 mg/dL (ref 6–20)
CO2: 22 mmol/L (ref 22–32)
Calcium: 8.9 mg/dL (ref 8.9–10.3)
Chloride: 106 mmol/L (ref 98–111)
Creatinine, Ser: 0.83 mg/dL (ref 0.44–1.00)
GFR, Estimated: >60 mL/min (ref >60)
Glucose, Bld: 89 mg/dL (ref 70–99)
Potassium: 3.8 mmol/L (ref 3.5–5.1)
Sodium: 138 mmol/L (ref 135–145)
Total Bilirubin: 0.2 mg/dL — ABNORMAL LOW (ref 0.3–1.2)
Total Protein: 6.5 g/dL (ref 6.5–8.1)

## 2020-12-12 MED ORDER — LACTATED RINGERS IV BOLUS
1000.0000 mL | Freq: Once | INTRAVENOUS | Status: DC
Start: 2020-12-12 — End: 2020-12-12

## 2020-12-12 MED ORDER — AMLODIPINE BESYLATE 5 MG PO TABS: 5.0000 mg | ORAL_TABLET | Freq: Every day | ORAL | 0 refills | Status: DC

## 2020-12-12 MED ORDER — KETOROLAC TROMETHAMINE 30 MG/ML IJ SOLN
60.0000 mg | Freq: Once | INTRAMUSCULAR | Status: AC
  Administered 2020-12-12: 19:00:00 60 mg via INTRAMUSCULAR
  Filled 2020-12-12: qty 2

## 2020-12-12 MED ORDER — LABETALOL HCL 5 MG/ML IV SOLN: 20.0000 mg | INTRAVENOUS | Status: DC | PRN

## 2020-12-12 MED ORDER — LABETALOL HCL 5 MG/ML IV SOLN: 40.0000 mg | INTRAVENOUS | Status: DC | PRN

## 2020-12-12 MED ORDER — PROMETHAZINE HCL 25 MG/ML IJ SOLN: 25.0000 mg | Freq: Once | INTRAMUSCULAR | Status: DC

## 2020-12-12 MED ORDER — LABETALOL HCL 5 MG/ML IV SOLN: 80.0000 mg | INTRAVENOUS | Status: DC | PRN

## 2020-12-12 MED ORDER — DEXAMETHASONE SODIUM PHOSPHATE 10 MG/ML IJ SOLN: 10.0000 mg | Freq: Once | INTRAMUSCULAR | Status: DC

## 2020-12-12 MED ORDER — HYDRALAZINE HCL 20 MG/ML IJ SOLN: 10.0000 mg | INTRAMUSCULAR | Status: DC | PRN

## 2020-12-12 MED ORDER — METOCLOPRAMIDE HCL 5 MG/ML IJ SOLN
10.0000 mg | Freq: Once | INTRAMUSCULAR | Status: AC
  Administered 2020-12-12: 19:00:00 10 mg via INTRAMUSCULAR
  Filled 2020-12-12: qty 2

## 2020-12-12 MED ORDER — DIPHENHYDRAMINE HCL 50 MG/ML IJ SOLN: 25.0000 mg | Freq: Once | INTRAMUSCULAR | Status: DC

## 2020-12-12 NOTE — Discharge Instructions (Signed)
Postpartum Hypertension Postpartum hypertension is high blood pressure that remains higher than normal after childbirth. You may not realize that you have postpartum hypertension if your blood pressure is not being checked regularly. In most cases, postpartum hypertension will go away on its own, usually within a week of delivery. However, for some women, medical treatment is required to prevent serious complications, such as seizures or stroke. What are the causes? This condition may be caused by one or more of the following:  Hypertension that existed before pregnancy (chronic hypertension).  Hypertension that comes on as a result of pregnancy (gestational hypertension).  Hypertensive disorders during pregnancy (preeclampsia) or seizures in women who have high blood pressure during pregnancy (eclampsia).  A condition in which the liver, platelets, and red blood cells are damaged during pregnancy (HELLP syndrome).  A condition in which the thyroid produces too much hormones (hyperthyroidism).  Other rare problems of the nerves (neurological disorders) or blood disorders. In some cases, the cause may not be known. What increases the risk? The following factors may make you more likely to develop this condition:  Chronic hypertension. In some cases, this may not have been diagnosed before pregnancy.  Obesity.  Type 2 diabetes.  Kidney disease.  History of preeclampsia or eclampsia.  Other medical conditions that change the level of hormones in the body (hormonal imbalance). What are the signs or symptoms? As with all types of hypertension, postpartum hypertension may not have any symptoms. Depending on how high your blood pressure is, you may experience:  Headaches. These may be mild, moderate, or severe. They may also be steady, constant, or sudden in onset (thunderclap headache).  Changes in your ability to see (visual changes).  Dizziness.  Shortness of breath.  Swelling  of your hands, feet, lower legs, or face. In some cases, you may have swelling in more than one of these locations.  Heart palpitations or a racing heartbeat.  Difficulty breathing while lying down.  Decrease in the amount of urine that you pass. Other rare signs and symptoms may include:  Sweating more than usual. This lasts longer than a few days after delivery.  Chest pain.  Sudden dizziness when you get up from sitting or lying down.  Seizures.  Nausea or vomiting.  Abdominal pain. How is this diagnosed? This condition may be diagnosed based on the results of a physical exam, blood pressure measurements, and blood and urine tests. You may also have other tests, such as a CT scan or an MRI, to check for other problems of postpartum hypertension. How is this treated? If blood pressure is high enough to require treatment, your options may include:  Medicines to reduce blood pressure (antihypertensives). Tell your health care provider if you are breastfeeding or if you plan to breastfeed. There are many antihypertensive medicines that are safe to take while breastfeeding.  Stopping medicines that may be causing hypertension.  Treating medical conditions that are causing hypertension.  Treating the complications of hypertension, such as seizures, stroke, or kidney problems. Your health care provider will also continue to monitor your blood pressure closely until it is within a safe range for you. Follow these instructions at home:  Take over-the-counter and prescription medicines only as told by your health care provider.  Return to your normal activities as told by your health care provider. Ask your health care provider what activities are safe for you.  Do not use any products that contain nicotine or tobacco, such as cigarettes and e-cigarettes. If   you need help quitting, ask your health care provider.  Keep all follow-up visits as told by your health care provider. This  is important. Contact a health care provider if:  Your symptoms get worse.  You have new symptoms, such as: ? A headache that does not get better. ? Dizziness. ? Visual changes. Get help right away if:  You suddenly develop swelling in your hands, ankles, or face.  You have sudden, rapid weight gain.  You develop difficulty breathing, chest pain, racing heartbeat, or heart palpitations.  You develop severe pain in your abdomen.  You have any symptoms of a stroke. "BE FAST" is an easy way to remember the main warning signs of a stroke: ? B - Balance. Signs are dizziness, sudden trouble walking, or loss of balance. ? E - Eyes. Signs are trouble seeing or a sudden change in vision. ? F - Face. Signs are sudden weakness or numbness of the face, or the face or eyelid drooping on one side. ? A - Arms. Signs are weakness or numbness in an arm. This happens suddenly and usually on one side of the body. ? S - Speech. Signs are sudden trouble speaking, slurred speech, or trouble understanding what people say. ? T - Time. Time to call emergency services. Write down what time symptoms started.  You have other signs of a stroke, such as: ? A sudden, severe headache with no known cause. ? Nausea or vomiting. ? Seizure. These symptoms may represent a serious problem that is an emergency. Do not wait to see if the symptoms will go away. Get medical help right away. Call your local emergency services (911 in the [place] Do not drive yourself to the hospital. Summary  Postpartum hypertension is high blood pressure that remains higher than normal after childbirth.  In most cases, postpartum hypertension will go away on its own, usually within a week of delivery.  For some women, medical treatment is required to prevent serious complications, such as seizures or stroke. This information is not intended to replace advice given to you by your health care provider. Make sure you discuss any questions  you have with your health care provider. Document Revised: 01/10/2019 Document Reviewed: 09/24/2017 Elsevier Patient Education  2020 Elsevier Inc.  

## 2020-12-12 NOTE — MAU Note (Signed)
Vag del on 12/20.  HA for 3 days, has a hx of migraines- this feels similar.  Gets better, gets worse, but won't completely go away.  Has not taken Fioricet or Flexeril because she is breast feeding.  Has been alternating Tylenol and Ibuprofen, doing ice packs, hydrating.  First 2 days BP was ok. BP has been elevated today 140-163/90-99.   No issues of HTN during the preg or while here. called and was told to come in.  Is light and sound sensative, little nausea- typical with  Her migraine. Denies visual changes, epigastric pain or increase in swelling.

## 2020-12-12 NOTE — MAU Provider Note (Addendum)
History     CSN: [id number]  Arrival date and time: 12/12/20 1749   Event Date/Time   First Provider Initiated Contact with Patient 12/12/20 1830      Chief Complaint  Patient presents with  . Headache   Jessica Banks is a 39 y.o. [id number] who is 6 days PP NSVD. She is here today with a headache she has had since 12/10/2020. She has a history of migraines and usually she can take flexeril and fioricet. She tried fioricet, but it did not help. She states that this feels like a typical migraine, but it started on her right and is now on her left. However, that doesn't usually happen with her migraines. She states that when it switched to her left side she also noticed that her blood pressure was up. She has taken her BP today and these are the readings: 152/90, 140/97, 163/92, 158/99. She denies any visual disturbance. She is having light sensitivity. She denies any RUQ pain. She denies any blood pressure problems during pregnancy or delivery.    OB History    Gravida  3   Para  2   Term  2   Preterm      AB  1   Living  2     SAB  1   IAB      Ectopic      Multiple  0   Live Births  2           Past Medical History:  Diagnosis Date  . Abnormal Pap smear    CIN I  . Anemia   . Anxiety   . Depression    off meds now, ok now  . Enlarged thyroid gland    nromal function  . GERD (gastroesophageal reflux disease)   . Migraine   . Obesity   . Ovarian cyst   . Vaginal Pap smear, abnormal    ok now    Past Surgical History:  Procedure Laterality Date  . LAPAROSCOPIC GASTRIC SLEEVE RESECTION      Family History  Problem Relation Age of Onset  . Thyroid disease Mother        s/p thyroidectomy  . Arthritis Mother   . Leukemia Maternal Grandmother   . Stroke Maternal Grandmother   . Cancer Maternal Grandfather        lung  . Heart disease Paternal Grandmother   . COPD Paternal Grandfather   . Hypertension Maternal Uncle   . Diabetes Maternal  Uncle     Social History   Tobacco Use  . Smoking status: Former Smoker    Years: 4.00    Types: Cigarettes    Quit date: 06/07/2013    Years since quitting: 7.5  . Smokeless tobacco: Never Used  . Tobacco comment: only smoked on the weekends  Vaping Use  . Vaping Use: Never used  Substance Use Topics  . Alcohol use: Not Currently    Comment: SOCIAL   . Drug use: No    Allergies:  Allergies  Allergen Reactions  . Novocain [Procaine] Anaphylaxis    Medications Prior to Admission  Medication Sig Dispense Refill Last Dose  . acetaminophen (TYLENOL) 500 MG tablet Take 1,000 mg by mouth every 6 (six) hours as needed.   12/12/2020 at 1100  . butalbital-acetaminophen-caffeine (FIORICET) 50-325-40 MG tablet Take 1-2 tablets by mouth every 4 (four) hours as needed. 30 tablet 0 Past Week at Unknown time  . DEXILANT 60 MG capsule TAKE 1  CAPSULE BY MOUTH EVERY DAY 30 capsule 3 12/11/2020 at Unknown time  . docusate sodium (COLACE) 100 MG capsule Take 100 mg by mouth daily.   12/11/2020 at Unknown time  . ibuprofen (ADVIL) 600 MG tablet Take 600 mg by mouth every 6 (six) hours as needed for headache or moderate pain.   12/12/2020 at 1500  . ondansetron (ZOFRAN) 8 MG tablet Take 8 mg by mouth every 8 (eight) hours as needed for nausea or vomiting.   Past Week at Unknown time  . Prenatal Vit-Fe Fumarate-FA (MULTIVITAMIN-PRENATAL) 27-0.8 MG TABS tablet Take 1 tablet by mouth daily at 12 noon.   12/12/2020 at Unknown time  . promethazine (PHENERGAN) 25 MG suppository Place 25 mg rectally every 6 (six) hours as needed for nausea or vomiting.   Past Month at Unknown time  . doxylamine, Sleep, (UNISOM) 25 MG tablet Take 25 mg by mouth at bedtime as needed.   More than a month at Unknown time    Review of Systems  Constitutional: Negative for fatigue and fever.  Eyes: Positive for photophobia. Negative for visual disturbance.  Gastrointestinal: Negative for nausea and vomiting.  Genitourinary:  Negative for pelvic pain. Vaginal bleeding: normal lochia.   Physical Exam   Blood pressure (!) 142/76, pulse 68, temperature 98.3 F (36.8 C), temperature source Oral, resp. rate 18, height 5' (1.524 m), weight 108.7 kg, SpO2 99 %, currently breastfeeding.  Physical Exam Vitals and nursing note reviewed. Exam conducted with a chaperone present.  Constitutional:      General: She is not in acute distress. HENT:     Head: Normocephalic.  Eyes:     Pupils: Pupils are equal, round, and reactive to light.  Cardiovascular:     Rate and Rhythm: Normal rate.  Pulmonary:     Effort: Pulmonary effort is normal.  Abdominal:     Palpations: Abdomen is soft.  Skin:    General: Skin is warm and dry.  Neurological:     Mental Status: She is alert and oriented to person, place, and time.     Deep Tendon Reflexes: Reflexes normal.     Results for orders placed or performed during the hospital encounter of 12/12/20 (from the past 24 hour(s))  CBC     Status: Abnormal   Collection Time: 12/12/20  6:27 PM  Result Value Ref Range   WBC 7.1 4.0 - 10.5 K/uL   RBC 4.08 3.87 - 5.11 MIL/uL   Hemoglobin 11.0 (L) 12.0 - 15.0 g/dL   HCT 36.3 36.0 - 46.0 %   MCV 89.0 80.0 - 100.0 fL   MCH 27.0 26.0 - 34.0 pg   MCHC 30.3 30.0 - 36.0 g/dL   RDW 14.3 11.5 - 15.5 %   Platelets 296 150 - 400 K/uL   nRBC 0.0 0.0 - 0.2 %  Comprehensive metabolic panel     Status: Abnormal   Collection Time: 12/12/20  6:27 PM  Result Value Ref Range   Sodium 138 135 - 145 mmol/L   Potassium 3.8 3.5 - 5.1 mmol/L   Chloride 106 98 - 111 mmol/L   CO2 22 22 - 32 mmol/L   Glucose, Bld 89 70 - 99 mg/dL   BUN 11 6 - 20 mg/dL   Creatinine, Ser 0.83 0.44 - 1.00 mg/dL   Calcium 8.9 8.9 - 10.3 mg/dL   Total Protein 6.5 6.5 - 8.1 g/dL   Albumin 2.8 (L) 3.5 - 5.0 g/dL   AST 17 15 - 41  U/L   ALT 12 0 - 44 U/L   Alkaline Phosphatase 153 (H) 38 - 126 U/L   Total Bilirubin 0.2 (L) 0.3 - 1.2 mg/dL   GFR, Estimated >60 >60  mL/min   Anion gap 10 5 - 15     MAU Course  Procedures  MDM  Patient agreeable to trying toradol and reglan for headache.   8:12PM Spoke with Dr. Elgie Congo, if HA is better then patient can be DC home on Norvasc with short follow up in the office. If headache is not better call him back and patient will likely need to be admitted.   8:13PM care turned over to Maye Hides, CNM   .Marcille Buffy DNP, CNM  12/12/20  8:15 PM    Assessment and Plan   1. Chronic nonintractable headache, unspecified headache type   2. Postpartum hypertension    2. Patient HA now a 4/10; improved from 8/10. Patient strongly desires discharge.   2. Patient stable for discharge with RX for Norvasc 5 mg daily.   3. Message sent to [facility] for follow up BP check on Tuesday. Strict pre-e precautions reviewed; patient has BP cuff at home and is able to check BP if she feels symptomatic.   Maye Hides

## 2020-12-16 ENCOUNTER — Other Ambulatory Visit: Payer: Self-pay

## 2020-12-16 ENCOUNTER — Ambulatory Visit: Payer: BC Managed Care – PPO

## 2020-12-16 ENCOUNTER — Ambulatory Visit (INDEPENDENT_AMBULATORY_CARE_PROVIDER_SITE_OTHER): Payer: BC Managed Care – PPO | Admitting: *Deleted

## 2020-12-16 VITALS — BP 129/82

## 2020-12-16 DIAGNOSIS — Z0130 Encounter for examination of blood pressure without abnormal findings: Secondary | ICD-10-CM

## 2020-12-16 NOTE — Progress Notes (Signed)
     Virtual Visit via Telephone Note  I connected with Jessica Banks on 12/16/20 at  1:00 PM EST by telephone and verified that I am speaking with the correct person using two identifiers.  Location: Patient: Home Provider: [facility]   I discussed the limitations, risks, security and privacy concerns of performing an evaluation and management service by telephone and the availability of in person appointments. I also discussed with the patient that there may be a patient responsible charge related to this service. The patient expressed understanding and agreed to proceed.  Subjective:  Jessica Banks is a 39 y.o. female here for BP check.   Hypertension ROS: taking medications as instructed, no medication side effects noted, home BP monitoring in range of 130's systolic over 70's diastolic, no TIA's, no chest pain on exertion, no dyspnea on exertion, no swelling of ankles, no orthostatic dizziness or lightheadedness, no orthopnea or paroxysmal nocturnal dyspnea and no palpitations.    Objective:  BP 129/82 (BP Location: Left Arm, Patient Position: Sitting, Cuff Size: Large)   Breastfeeding Yes   Appearance non face to face interview.     Assessment:   Blood Pressure improved and asymptomatic.   Plan:  Current treatment plan is effective, no change in therapy..  Follow Up Instructions:   I discussed the assessment and treatment plan with the patient. The patient was provided an opportunity to ask questions and all were answered. The patient agreed with the plan and demonstrated an understanding of the instructions.   The patient was advised to call back or seek an in-person evaluation if the symptoms worsen or if the condition fails to improve as anticipated.  I provided 10 minutes of non-face-to-face time during this encounter.   Clovis Pu, RN

## 2020-12-17 ENCOUNTER — Other Ambulatory Visit: Payer: Self-pay | Admitting: Internal Medicine

## 2020-12-31 DIAGNOSIS — F4320 Adjustment disorder, unspecified: Secondary | ICD-10-CM | POA: Diagnosis not present

## 2021-01-06 ENCOUNTER — Other Ambulatory Visit: Payer: Self-pay

## 2021-01-06 ENCOUNTER — Ambulatory Visit (INDEPENDENT_AMBULATORY_CARE_PROVIDER_SITE_OTHER): Payer: BC Managed Care – PPO | Admitting: Obstetrics and Gynecology

## 2021-01-06 ENCOUNTER — Encounter: Payer: Self-pay | Admitting: Obstetrics and Gynecology

## 2021-01-06 DIAGNOSIS — O165 Unspecified maternal hypertension, complicating the puerperium: Secondary | ICD-10-CM

## 2021-01-06 DIAGNOSIS — F411 Generalized anxiety disorder: Secondary | ICD-10-CM | POA: Diagnosis not present

## 2021-01-06 MED ORDER — AMLODIPINE BESYLATE 5 MG PO TABS: 5.0000 mg | ORAL_TABLET | Freq: Every day | ORAL | 0 refills | Status: DC

## 2021-01-06 NOTE — Progress Notes (Addendum)
[facility] Partum Visit Note  Jessica Banks is a 40 y.o. G5P2012 female who presents for a postpartum visit. She is 4 weeks postpartum following a normal spontaneous vaginal delivery.  I have fully reviewed the prenatal and intrapartum course. The delivery was at 39.1 gestational weeks.  Anesthesia: epidural. Postpartum course has been complicated by elevated BPs and frequent H/As. She reports the H/As are resolved with Tylenol or Ibuprofen. She is concerned about using Fioricet d/t the potential effects on baby through breast milk. Baby "Jacklyn Shell" is doing well and gaining weight well. Baby is feeding by breast. Bleeding staining only. Bowel function is normal. Bladder function is normal. Patient is not sexually active. Contraception method is vasectomy. Spouse has consultation appointment at the [facility] scheduled for 03/10/2021. Postpartum depression screening: negative.   The pregnancy intention screening data noted above was reviewed. Potential methods of contraception were discussed. The patient elected to proceed with Vasectomy.    Edinburgh Postnatal Depression Scale - 01/06/21 0937      Edinburgh Postnatal Depression Scale:  In the Past 7 Days   I have been able to laugh and see the funny side of things. 0    I have looked forward with enjoyment to things. 0    I have blamed myself unnecessarily when things went wrong. 0    I have been anxious or worried for no good reason. 0    I have felt scared or panicky for no good reason. 0    Things have been getting on top of me. 0    I have been so unhappy that I have had difficulty sleeping. 0    I have felt sad or miserable. 0    I have been so unhappy that I have been crying. 0    The thought of harming myself has occurred to me. 0    Edinburgh Postnatal Depression Scale Total 0            The following portions of the patient's history were reviewed and updated as appropriate: allergies, current medications, past family  history, past medical history, past social history, past surgical history and problem list.  Review of Systems Constitutional: negative Eyes: negative Ears, nose, mouth, throat, and face: negative Respiratory: negative Cardiovascular: negative Gastrointestinal: negative Genitourinary:negative Integument/breast: negative Hematologic/lymphatic: negative Musculoskeletal:negative Neurological: positive for headaches Behavioral/Psych: negative Endocrine: negative Allergic/Immunologic: negative    Objective:  Temp (!) 97.4 F (36.3 C) (Oral)   Ht 5' (1.524 m)   Wt 225 lb 3.2 oz (102.2 kg)   Breastfeeding Yes   BMI 43.98 kg/m    General:  alert, cooperative and no distress   Breasts:  inspection negative, no nipple discharge or bleeding, no masses or nodularity palpable  Lungs: clear to auscultation bilaterally  Heart:  regular rate and rhythm, S1, S2 normal, no murmur, click, rub or gallop  Abdomen: soft, non-tender; bowel sounds normal; no masses,  no organomegaly   Vulva:  not evaluated  Vagina: not evaluated  Cervix:  not evaluated  Corpus: not examined  Adnexa:  not evaluated  Rectal Exam: Not performed.        Assessment:  Encounter for postpartum care of lactating mother - Normal postpartum exam. Pap smear not done at today's visit.  Postpartum hypertension  - Rx renewed for amLODipine (NORVASC) 5 MG tablet - Advised to not take it tonight as scheduled and monitor BP without medication x 1 week - Advised that if BPs are WNL, will no  longer need Amlodopine   Plan:   Essential components of care per ACOG recommendations:  1.  Mood and well being: Patient with negative depression screening today. Reviewed local resources for support.  - Patient does not use tobacco.  - hx of drug use? No    2. Infant care and feeding:  -Patient currently breastmilk feeding? Yes If breastmilk feeding discussed return to work and pumping. If needed, patient was provided letter for  work to allow for every 2-3 hr pumping breaks, and to be granted a private location to express breastmilk and refrigerated area to store breastmilk. Reviewed importance of draining breast regularly to support lactation. -Social determinants of health (SDOH) reviewed in EPIC. No concerns  3. Sexuality, contraception and birth spacing - Patient does not want a pregnancy in the next year.  Desired family size is 2 children.  - Reviewed forms of contraception in tiered fashion. Patient's husband will have vasectomy in the near future.   - Discussed birth spacing of 18 months  4. Sleep and fatigue -Encouraged family/partner/community support of 4 hrs of uninterrupted sleep to help with mood and fatigue  5. Physical Recovery  - Discussed patients delivery and complications - Patient had superficial vaginal and bilateral periurethral lacerations; no repair required. Perineal healing reviewed. Patient expressed understanding - Patient has urinary incontinence? No - Patient is safe to resume physical and sexual activity. Able to resume sexual activity in 2 weeks. Patient plans to return back to work on 02/15/2021.  6.  Health Maintenance - Last pap smear done 05/18/2020 and was normal with negative HPV. Mammogram at age 32  7. No Chronic Disease - PCP follow up  [facility] [place], [place] for [place]

## 2021-01-10 ENCOUNTER — Encounter: Payer: Self-pay | Admitting: *Deleted

## 2021-01-13 DIAGNOSIS — F411 Generalized anxiety disorder: Secondary | ICD-10-CM | POA: Diagnosis not present

## 2021-01-27 DIAGNOSIS — F411 Generalized anxiety disorder: Secondary | ICD-10-CM | POA: Diagnosis not present

## 2021-01-28 DIAGNOSIS — F4320 Adjustment disorder, unspecified: Secondary | ICD-10-CM | POA: Diagnosis not present

## 2021-02-03 DIAGNOSIS — F411 Generalized anxiety disorder: Secondary | ICD-10-CM | POA: Diagnosis not present

## 2021-02-10 DIAGNOSIS — F411 Generalized anxiety disorder: Secondary | ICD-10-CM | POA: Diagnosis not present

## 2021-02-14 DIAGNOSIS — F4320 Adjustment disorder, unspecified: Secondary | ICD-10-CM | POA: Diagnosis not present

## 2021-02-22 DIAGNOSIS — E559 Vitamin D deficiency, unspecified: Secondary | ICD-10-CM | POA: Diagnosis not present

## 2021-02-22 DIAGNOSIS — E049 Nontoxic goiter, unspecified: Secondary | ICD-10-CM | POA: Diagnosis not present

## 2021-02-22 DIAGNOSIS — R7309 Other abnormal glucose: Secondary | ICD-10-CM | POA: Diagnosis not present

## 2021-02-22 DIAGNOSIS — R790 Abnormal level of blood mineral: Secondary | ICD-10-CM | POA: Diagnosis not present

## 2021-02-22 DIAGNOSIS — R635 Abnormal weight gain: Secondary | ICD-10-CM | POA: Diagnosis not present

## 2021-02-22 DIAGNOSIS — F4320 Adjustment disorder, unspecified: Secondary | ICD-10-CM | POA: Diagnosis not present

## 2021-02-22 DIAGNOSIS — R5382 Chronic fatigue, unspecified: Secondary | ICD-10-CM | POA: Diagnosis not present

## 2021-02-22 DIAGNOSIS — N951 Menopausal and female climacteric states: Secondary | ICD-10-CM | POA: Diagnosis not present

## 2021-02-22 LAB — HEMOGLOBIN A1C: Hemoglobin A1C: 5.2

## 2021-03-02 DIAGNOSIS — E559 Vitamin D deficiency, unspecified: Secondary | ICD-10-CM | POA: Diagnosis not present

## 2021-03-02 DIAGNOSIS — Z6834 Body mass index (BMI) 34.0-34.9, adult: Secondary | ICD-10-CM | POA: Diagnosis not present

## 2021-03-02 DIAGNOSIS — Z1331 Encounter for screening for depression: Secondary | ICD-10-CM | POA: Diagnosis not present

## 2021-03-02 DIAGNOSIS — R748 Abnormal levels of other serum enzymes: Secondary | ICD-10-CM | POA: Diagnosis not present

## 2021-03-02 DIAGNOSIS — E049 Nontoxic goiter, unspecified: Secondary | ICD-10-CM | POA: Diagnosis not present

## 2021-03-02 DIAGNOSIS — Z1339 Encounter for screening examination for other mental health and behavioral disorders: Secondary | ICD-10-CM | POA: Diagnosis not present

## 2021-03-03 ENCOUNTER — Other Ambulatory Visit: Payer: Self-pay

## 2021-03-03 ENCOUNTER — Encounter: Payer: Self-pay | Admitting: Internal Medicine

## 2021-03-03 ENCOUNTER — Ambulatory Visit (INDEPENDENT_AMBULATORY_CARE_PROVIDER_SITE_OTHER): Payer: BC Managed Care – PPO | Admitting: Internal Medicine

## 2021-03-03 VITALS — BP 116/82 | HR 82 | Temp 98.0°F | Ht 60.0 in | Wt 228.2 lb

## 2021-03-03 DIAGNOSIS — M5431 Sciatica, right side: Secondary | ICD-10-CM | POA: Diagnosis not present

## 2021-03-03 DIAGNOSIS — E041 Nontoxic single thyroid nodule: Secondary | ICD-10-CM | POA: Diagnosis not present

## 2021-03-03 DIAGNOSIS — Z0000 Encounter for general adult medical examination without abnormal findings: Secondary | ICD-10-CM

## 2021-03-03 DIAGNOSIS — E559 Vitamin D deficiency, unspecified: Secondary | ICD-10-CM

## 2021-03-03 DIAGNOSIS — R748 Abnormal levels of other serum enzymes: Secondary | ICD-10-CM

## 2021-03-03 DIAGNOSIS — F419 Anxiety disorder, unspecified: Secondary | ICD-10-CM

## 2021-03-03 DIAGNOSIS — Z6841 Body Mass Index (BMI) 40.0 and over, adult: Secondary | ICD-10-CM

## 2021-03-03 NOTE — Progress Notes (Signed)
I,Katawbba Wiggins,acting as a Education administrator for [name], MD.,have documented all relevant documentation on the behalf of [name], MD,as directed by  [name], MD while in the presence of [name], MD.  This visit occurred during the SARS-CoV-2 public health emergency.  Safety protocols were in place, including screening questions prior to the visit, additional usage of staff PPE, and extensive cleaning of exam room while observing appropriate contact time as indicated for disinfecting solutions.  Subjective:     Patient ID: Jessica Banks , female    DOB: 08-02-1981 , 40 y.o.   MRN: [id number]   Chief Complaint  Patient presents with  . Annual Exam    HPI  She is here today for a full physical examination. She is followed by [facility] [place], [place], [place], for her GYN care. She recently gave birth in December 2021. She was last seen by GYN January 06, 2021.      Past Medical History:  Diagnosis Date  . Abnormal Pap smear    CIN I  . Anemia   . Anxiety   . Depression    off meds now, ok now  . Enlarged thyroid gland    nromal function  . GERD (gastroesophageal reflux disease)   . Migraine   . Obesity   . Ovarian cyst   . Vaginal Pap smear, abnormal    ok now     Family History  Problem Relation Age of Onset  . Thyroid disease Mother        s/p thyroidectomy  . Arthritis Mother   . Leukemia Maternal Grandmother   . Stroke Maternal Grandmother   . Cancer Maternal Grandfather        lung  . Heart disease Paternal Grandmother   . COPD Paternal Grandfather   . Hypertension Maternal Uncle   . Diabetes Maternal Uncle      Current Outpatient Medications:  .  Biotin w/ Vitamins C & E (HAIR/SKIN/NAILS PO), Take by mouth. 1 per day, Disp: , Rfl:  .  DEXILANT 60 MG capsule, TAKE 1 CAPSULE BY MOUTH EVERY DAY, Disp: 30 capsule, Rfl: 3 .  ibuprofen (ADVIL) 600 MG tablet, Take 600 mg by mouth every 6 (six) hours as  needed for headache or moderate pain., Disp: , Rfl:  .  Prenatal Vit-Fe Fumarate-FA (MULTIVITAMIN-PRENATAL) 27-0.8 MG TABS tablet, Take 1 tablet by mouth daily at 12 noon., Disp: , Rfl:  .  butalbital-acetaminophen-caffeine (FIORICET) 50-325-40 MG tablet, Take 1-2 tablets by mouth every 4 (four) hours as needed. (Patient not taking: No sig reported), Disp: 30 tablet, Rfl: 0   Allergies  Allergen Reactions  . Novocain [Procaine] Anaphylaxis      The patient states she uses none for birth control. Last LMP was Patient's last menstrual period was 02/28/2021.. Negative for Dysmenorrhea. Negative for: breast discharge, breast lump(s), breast pain and breast self exam. Associated symptoms include abnormal vaginal bleeding. Pertinent negatives include abnormal bleeding (hematology), anxiety, decreased libido, depression, difficulty falling sleep, dyspareunia, history of infertility, nocturia, sexual dysfunction, sleep disturbances, urinary incontinence, urinary urgency, vaginal discharge and vaginal itching. Diet regular.The patient states her exercise level is  intermittent.  . The patient's tobacco use is:  Social History   Tobacco Use  Smoking Status Former Smoker  . Years: 4.00  . Types: Cigarettes  . Quit date: 06/07/2013  . Years since quitting: 7.7  Smokeless Tobacco Never Used  Tobacco Comment   only smoked  on the weekends  . She has been exposed to passive smoke. The patient's alcohol use is:  Social History   Substance and Sexual Activity  Alcohol Use Not Currently   Comment: SOCIAL    Review of Systems  Constitutional: Negative.   HENT: Negative.   Eyes: Negative.   Respiratory: Negative.   Cardiovascular: Negative.   Gastrointestinal: Negative.   Endocrine: Negative.   Genitourinary: Negative.   Musculoskeletal: Positive for back pain.       She c/o right-sided back pain. States she experienced sciatica shortly after delivery. Denies fall/trauma. She did have epidural.  Denies LE paresthesias/weakness.   Skin: Negative.   Allergic/Immunologic: Negative.   Neurological: Negative.   Hematological: Negative.   Psychiatric/Behavioral: Negative.      Today's Vitals   03/03/21 0940  BP: 116/82  Pulse: 82  Temp: 98 F (36.7 C)  TempSrc: Oral  Weight: 228 lb 3.2 oz (103.5 kg)  Height: 5' (1.524 m)   Body mass index is 44.57 kg/m.  Wt Readings from Last 3 Encounters:  03/03/21 228 lb 3.2 oz (103.5 kg)  01/06/21 225 lb 3.2 oz (102.2 kg)  12/12/20 239 lb 11.2 oz (108.7 kg)   Objective:  Physical Exam Vitals and nursing note reviewed.  Constitutional:      General: She is not in acute distress.    Appearance: Normal appearance. She is well-developed. She is obese.  HENT:     Head: Normocephalic and atraumatic.     Right Ear: Hearing, tympanic membrane, ear canal and external ear normal. There is no impacted cerumen.     Left Ear: Hearing, tympanic membrane, ear canal and external ear normal. There is no impacted cerumen.     Nose:     Comments: Masked     Mouth/Throat:     Comments: Masked  Eyes:     General: Lids are normal.     Extraocular Movements: Extraocular movements intact.     Conjunctiva/sclera: Conjunctivae normal.     Pupils: Pupils are equal, round, and reactive to light.  Neck:     Thyroid: Thyromegaly present. No thyroid mass.     Vascular: No carotid bruit.  Cardiovascular:     Rate and Rhythm: Normal rate and regular rhythm.     Pulses: Normal pulses.     Heart sounds: Normal heart sounds. No murmur heard.   Pulmonary:     Effort: Pulmonary effort is normal.     Breath sounds: Normal breath sounds.  Abdominal:     General: Bowel sounds are normal. There is no distension.     Palpations: Abdomen is soft.     Tenderness: There is no abdominal tenderness.  Genitourinary:    Comments: Deferred  Musculoskeletal:        General: No swelling. Normal range of motion.     Cervical back: Full passive range of motion without  pain, normal range of motion and neck supple.     Right lower leg: No edema.     Left lower leg: No edema.  Skin:    General: Skin is warm and dry.     Capillary Refill: Capillary refill takes less than 2 seconds.  Neurological:     General: No focal deficit present.     Mental Status: She is alert and oriented to person, place, and time.     Cranial Nerves: No cranial nerve deficit.     Sensory: No sensory deficit.  Psychiatric:  Mood and Affect: Mood normal.        Behavior: Behavior normal.        Thought Content: Thought content normal.        Judgment: Judgment normal.         Assessment And Plan:     1. Routine general medical examination at health care facility Comments: A full exam was performed. Importance of monthly self breast exams was discussed with the patient.I reviewed her labs from [facility] in full detail during visit. Her labs were significant for LDL 115, elevated alkaline phosphatase 189 and low vitamin D, 17.9.  PATIENT IS ADVISED TO GET 30-45 MINUTES REGULAR EXERCISE NO LESS THAN FOUR TO FIVE DAYS PER WEEK - BOTH WEIGHTBEARING EXERCISES AND AEROBIC ARE RECOMMENDED.  PATIENT IS ADVISED TO FOLLOW A HEALTHY DIET WITH AT LEAST SIX FRUITS/VEGGIES PER DAY, DECREASE INTAKE OF RED MEAT, AND TO INCREASE FISH INTAKE TO TWO DAYS PER WEEK.  MEATS/FISH SHOULD NOT BE FRIED, BAKED OR BROILED IS PREFERABLE.  IT IS ALSO IMPORTANT TO CUT BACK ON YOUR SUGAR INTAKE. PLEASE AVOID ANYTHING WITH ADDED SUGAR, CORN SYRUP OR OTHER SWEETENERS. IF YOU MUST USE A SWEETENER, YOU CAN TRY STEVIA. IT IS ALSO IMPORTANT TO AVOID ARTIFICIALLY SWEETENERS AND DIET BEVERAGES. LASTLY, I SUGGEST WEARING SPF 50 SUNSCREEN ON EXPOSED PARTS AND ESPECIALLY WHEN IN THE DIRECT SUNLIGHT FOR AN EXTENDED PERIOD OF TIME.  PLEASE AVOID FAST FOOD RESTAURANTS AND INCREASE YOUR WATER INTAKE.  - Hepatitis C antibody  2. Sciatica of right side Comments: She is encouraged to perform stretching exercises and reminded  that she has decreased core strength.She is encouraged to let me know if her sx persist.   3. Elevated alkaline phosphatase level Comments: Previous labs reviewed. I will check alk phos isoenzymes. I will consider liver ultrasound as well.  - Alkaline Phosphatase, Isoenzymes - Gamma GT, GGT ([id number])  4. Vitamin D deficiency disease Comments: I will check vitamin D level and supplement as needed.   5. Thyroid nodule Comments: I will schedule f/u thyroid ultrasound  6. Class 3 severe obesity due to excess calories without serious comorbidity with body mass index (BMI) of 40.0 to 44.9 in adult [facility]) Comments: She is down 11 pounds since Dec 2021. She is encouraged to incorporate more exercise into her daily routine to help her achieve her weight loss goals.  Patient was given opportunity to ask questions. Patient verbalized understanding of the plan and was able to repeat key elements of the plan. All questions were answered to their satisfaction.   I, [name], MD, have reviewed all documentation for this visit. The documentation on 03/07/21 for the exam, diagnosis, procedures, and orders are all accurate and complete.  THE PATIENT IS ENCOURAGED TO PRACTICE SOCIAL DISTANCING DUE TO THE COVID-19 PANDEMIC.

## 2021-03-03 NOTE — Patient Instructions (Signed)
Health Maintenance, Female Adopting a healthy lifestyle and getting preventive care are important in promoting health and wellness. Ask your health care provider about:  The right schedule for you to have regular tests and exams.  Things you can do on your own to prevent diseases and keep yourself healthy. What should I know about diet, weight, and exercise? Eat a healthy diet  Eat a diet that includes plenty of vegetables, fruits, low-fat dairy products, and lean protein.  Do not eat a lot of foods that are high in solid fats, added sugars, or sodium.   Maintain a healthy weight Body mass index (BMI) is used to identify weight problems. It estimates body fat based on height and weight. Your health care provider can help determine your BMI and help you achieve or maintain a healthy weight. Get regular exercise Get regular exercise. This is one of the most important things you can do for your health. Most adults should:  Exercise for at least 150 minutes each week. The exercise should increase your heart rate and make you sweat (moderate-intensity exercise).  Do strengthening exercises at least twice a week. This is in addition to the moderate-intensity exercise.  Spend less time sitting. Even light physical activity can be beneficial. Watch cholesterol and blood lipids Have your blood tested for lipids and cholesterol at 39 years of age, then have this test every 5 years. Have your cholesterol levels checked more often if:  Your lipid or cholesterol levels are high.  You are older than 40 years of age.  You are at high risk for heart disease. What should I know about cancer screening? Depending on your health history and family history, you may need to have cancer screening at various ages. This may include screening for:  Breast cancer.  Cervical cancer.  Colorectal cancer.  Skin cancer.  Lung cancer. What should I know about heart disease, diabetes, and high blood  pressure? Blood pressure and heart disease  High blood pressure causes heart disease and increases the risk of stroke. This is more likely to develop in people who have high blood pressure readings, are of African descent, or are overweight.  Have your blood pressure checked: ? Every 3-5 years if you are 18-39 years of age. ? Every year if you are 40 years old or older. Diabetes Have regular diabetes screenings. This checks your fasting blood sugar level. Have the screening done:  Once every three years after age 40 if you are at a normal weight and have a low risk for diabetes.  More often and at a younger age if you are overweight or have a high risk for diabetes. What should I know about preventing infection? Hepatitis B If you have a higher risk for hepatitis B, you should be screened for this virus. Talk with your health care provider to find out if you are at risk for hepatitis B infection. Hepatitis C Testing is recommended for:  Everyone born from 1945 through 1965.  Anyone with known risk factors for hepatitis C. Sexually transmitted infections (STIs)  Get screened for STIs, including gonorrhea and chlamydia, if: ? You are sexually active and are younger than 39 years of age. ? You are older than 39 years of age and your health care provider tells you that you are at risk for this type of infection. ? Your sexual activity has changed since you were last screened, and you are at increased risk for chlamydia or gonorrhea. Ask your health care provider   if you are at risk.  Ask your health care provider about whether you are at high risk for HIV. Your health care provider may recommend a prescription medicine to help prevent HIV infection. If you choose to take medicine to prevent HIV, you should first get tested for HIV. You should then be tested every 3 months for as long as you are taking the medicine. Pregnancy  If you are about to stop having your period (premenopausal) and  you may become pregnant, seek counseling before you get pregnant.  Take 400 to 800 micrograms (mcg) of folic acid every day if you become pregnant.  Ask for birth control (contraception) if you want to prevent pregnancy. Osteoporosis and menopause Osteoporosis is a disease in which the bones lose minerals and strength with aging. This can result in bone fractures. If you are 65 years old or older, or if you are at risk for osteoporosis and fractures, ask your health care provider if you should:  Be screened for bone loss.  Take a calcium or vitamin D supplement to lower your risk of fractures.  Be given hormone replacement therapy (HRT) to treat symptoms of menopause. Follow these instructions at home: Lifestyle  Do not use any products that contain nicotine or tobacco, such as cigarettes, e-cigarettes, and chewing tobacco. If you need help quitting, ask your health care provider.  Do not use street drugs.  Do not share needles.  Ask your health care provider for help if you need support or information about quitting drugs. Alcohol use  Do not drink alcohol if: ? Your health care provider tells you not to drink. ? You are pregnant, may be pregnant, or are planning to become pregnant.  If you drink alcohol: ? Limit how much you use to 0-1 drink a day. ? Limit intake if you are breastfeeding.  Be aware of how much alcohol is in your drink. In the [place] one drink equals one 12 oz bottle of beer (355 mL), one 5 oz glass of wine (148 mL), or one 1 oz glass of hard liquor (44 mL). General instructions  Schedule regular health, dental, and eye exams.  Stay current with your vaccines.  Tell your health care provider if: ? You often feel depressed. ? You have ever been abused or do not feel safe at home. Summary  Adopting a healthy lifestyle and getting preventive care are important in promoting health and wellness.  Follow your health care provider's instructions about healthy  diet, exercising, and getting tested or screened for diseases.  Follow your health care provider's instructions on monitoring your cholesterol and blood pressure. This information is not intended to replace advice given to you by your health care provider. Make sure you discuss any questions you have with your health care provider. Document Revised: 11/27/2018 Document Reviewed: 11/27/2018 Elsevier Patient Education  2021 [employer] Inc[employer]  

## 2021-03-06 DIAGNOSIS — E559 Vitamin D deficiency, unspecified: Secondary | ICD-10-CM | POA: Insufficient documentation

## 2021-03-06 DIAGNOSIS — E041 Nontoxic single thyroid nodule: Secondary | ICD-10-CM | POA: Insufficient documentation

## 2021-03-06 DIAGNOSIS — M5431 Sciatica, right side: Secondary | ICD-10-CM | POA: Insufficient documentation

## 2021-03-06 DIAGNOSIS — R748 Abnormal levels of other serum enzymes: Secondary | ICD-10-CM | POA: Insufficient documentation

## 2021-03-07 DIAGNOSIS — Z6841 Body Mass Index (BMI) 40.0 and over, adult: Secondary | ICD-10-CM | POA: Diagnosis not present

## 2021-03-07 DIAGNOSIS — R7303 Prediabetes: Secondary | ICD-10-CM | POA: Diagnosis not present

## 2021-03-07 LAB — HEPATITIS C ANTIBODY: Hep C Virus Ab: <0.1 s/co ratio (ref 0.0–0.9)

## 2021-03-07 LAB — ALKALINE PHOSPHATASE, ISOENZYMES
Alkaline Phosphatase: 180 IU/L — ABNORMAL HIGH (ref 44–121)
BONE FRACTION: 28 % (ref 14–68)
INTESTINAL FRAC.: 0 % (ref 0–18)
LIVER FRACTION: 72 % (ref 18–85)

## 2021-03-07 LAB — GAMMA GT: GGT: 11 IU/L (ref 0–60)

## 2021-03-08 ENCOUNTER — Encounter: Payer: Self-pay | Admitting: Internal Medicine

## 2021-03-10 DIAGNOSIS — F411 Generalized anxiety disorder: Secondary | ICD-10-CM | POA: Diagnosis not present

## 2021-03-14 DIAGNOSIS — Z6841 Body Mass Index (BMI) 40.0 and over, adult: Secondary | ICD-10-CM | POA: Diagnosis not present

## 2021-03-14 DIAGNOSIS — E7800 Pure hypercholesterolemia, unspecified: Secondary | ICD-10-CM | POA: Diagnosis not present

## 2021-03-14 DIAGNOSIS — E559 Vitamin D deficiency, unspecified: Secondary | ICD-10-CM | POA: Diagnosis not present

## 2021-04-01 DIAGNOSIS — R7309 Other abnormal glucose: Secondary | ICD-10-CM | POA: Diagnosis not present

## 2021-04-01 DIAGNOSIS — Z6841 Body Mass Index (BMI) 40.0 and over, adult: Secondary | ICD-10-CM | POA: Diagnosis not present

## 2021-04-01 DIAGNOSIS — F411 Generalized anxiety disorder: Secondary | ICD-10-CM | POA: Diagnosis not present

## 2021-04-01 DIAGNOSIS — E559 Vitamin D deficiency, unspecified: Secondary | ICD-10-CM | POA: Diagnosis not present

## 2021-04-05 DIAGNOSIS — Z6841 Body Mass Index (BMI) 40.0 and over, adult: Secondary | ICD-10-CM | POA: Diagnosis not present

## 2021-04-05 DIAGNOSIS — E7800 Pure hypercholesterolemia, unspecified: Secondary | ICD-10-CM | POA: Diagnosis not present

## 2021-04-07 ENCOUNTER — Ambulatory Visit
Admission: RE | Admit: 2021-04-07 | Discharge: 2021-04-07 | Disposition: A | Discharge status: Home / Self Care | Payer: BC Managed Care – PPO | Source: Ambulatory Visit | Attending: Internal Medicine | Admitting: Internal Medicine

## 2021-04-07 DIAGNOSIS — E041 Nontoxic single thyroid nodule: Secondary | ICD-10-CM | POA: Diagnosis not present

## 2021-04-13 DIAGNOSIS — Z6841 Body Mass Index (BMI) 40.0 and over, adult: Secondary | ICD-10-CM | POA: Diagnosis not present

## 2021-04-13 DIAGNOSIS — R7303 Prediabetes: Secondary | ICD-10-CM | POA: Diagnosis not present

## 2021-04-13 DIAGNOSIS — E119 Type 2 diabetes mellitus without complications: Secondary | ICD-10-CM | POA: Diagnosis not present

## 2021-04-18 DIAGNOSIS — F4320 Adjustment disorder, unspecified: Secondary | ICD-10-CM | POA: Diagnosis not present

## 2021-04-19 DIAGNOSIS — E7800 Pure hypercholesterolemia, unspecified: Secondary | ICD-10-CM | POA: Diagnosis not present

## 2021-04-19 DIAGNOSIS — Z6841 Body Mass Index (BMI) 40.0 and over, adult: Secondary | ICD-10-CM | POA: Diagnosis not present

## 2021-04-19 DIAGNOSIS — F411 Generalized anxiety disorder: Secondary | ICD-10-CM | POA: Diagnosis not present

## 2021-05-02 DIAGNOSIS — R7303 Prediabetes: Secondary | ICD-10-CM | POA: Diagnosis not present

## 2021-05-02 DIAGNOSIS — Z6841 Body Mass Index (BMI) 40.0 and over, adult: Secondary | ICD-10-CM | POA: Diagnosis not present

## 2021-05-03 ENCOUNTER — Other Ambulatory Visit: Payer: Self-pay | Admitting: Internal Medicine

## 2021-05-03 ENCOUNTER — Encounter: Payer: Self-pay | Admitting: Internal Medicine

## 2021-05-03 DIAGNOSIS — F4320 Adjustment disorder, unspecified: Secondary | ICD-10-CM | POA: Diagnosis not present

## 2021-05-17 DIAGNOSIS — E7800 Pure hypercholesterolemia, unspecified: Secondary | ICD-10-CM | POA: Diagnosis not present

## 2021-05-17 DIAGNOSIS — Z6841 Body Mass Index (BMI) 40.0 and over, adult: Secondary | ICD-10-CM | POA: Diagnosis not present

## 2021-05-25 DIAGNOSIS — F411 Generalized anxiety disorder: Secondary | ICD-10-CM | POA: Diagnosis not present

## 2021-06-02 DIAGNOSIS — F411 Generalized anxiety disorder: Secondary | ICD-10-CM | POA: Diagnosis not present

## 2021-06-13 DIAGNOSIS — F4320 Adjustment disorder, unspecified: Secondary | ICD-10-CM | POA: Diagnosis not present

## 2021-06-23 ENCOUNTER — Encounter: Payer: Self-pay | Admitting: Internal Medicine

## 2021-06-27 MED ORDER — SERTRALINE HCL 25 MG PO TABS: ORAL_TABLET | ORAL | 0 refills | Status: DC

## 2021-06-28 DIAGNOSIS — F411 Generalized anxiety disorder: Secondary | ICD-10-CM | POA: Diagnosis not present

## 2021-07-14 DIAGNOSIS — F411 Generalized anxiety disorder: Secondary | ICD-10-CM | POA: Diagnosis not present

## 2021-07-25 ENCOUNTER — Other Ambulatory Visit: Payer: Self-pay | Admitting: Internal Medicine

## 2021-07-26 ENCOUNTER — Other Ambulatory Visit: Payer: Self-pay | Admitting: Internal Medicine

## 2021-07-26 ENCOUNTER — Encounter: Payer: Self-pay | Admitting: Internal Medicine

## 2021-07-27 ENCOUNTER — Other Ambulatory Visit: Payer: Self-pay

## 2021-07-27 MED ORDER — SERTRALINE HCL 50 MG PO TABS: ORAL_TABLET | ORAL | 0 refills | Status: DC

## 2021-07-27 MED ORDER — SERTRALINE HCL 25 MG PO TABS: ORAL_TABLET | ORAL | 0 refills | Status: DC

## 2021-08-01 DIAGNOSIS — F411 Generalized anxiety disorder: Secondary | ICD-10-CM | POA: Diagnosis not present

## 2021-08-02 DIAGNOSIS — F411 Generalized anxiety disorder: Secondary | ICD-10-CM | POA: Diagnosis not present

## 2021-08-10 ENCOUNTER — Other Ambulatory Visit: Payer: Self-pay

## 2021-08-10 ENCOUNTER — Ambulatory Visit: Payer: BC Managed Care – PPO | Admitting: Internal Medicine

## 2021-08-10 ENCOUNTER — Encounter: Payer: Self-pay | Admitting: Internal Medicine

## 2021-08-10 VITALS — BP 118/68 | HR 77 | Temp 98.4°F | Ht 60.8 in | Wt 236.4 lb

## 2021-08-10 DIAGNOSIS — F419 Anxiety disorder, unspecified: Secondary | ICD-10-CM | POA: Diagnosis not present

## 2021-08-10 DIAGNOSIS — E049 Nontoxic goiter, unspecified: Secondary | ICD-10-CM

## 2021-08-10 DIAGNOSIS — G43009 Migraine without aura, not intractable, without status migrainosus: Secondary | ICD-10-CM | POA: Diagnosis not present

## 2021-08-10 DIAGNOSIS — Z6841 Body Mass Index (BMI) 40.0 and over, adult: Secondary | ICD-10-CM

## 2021-08-10 MED ORDER — SERTRALINE HCL 50 MG PO TABS: ORAL_TABLET | ORAL | 2 refills | Status: DC

## 2021-08-10 NOTE — Progress Notes (Signed)
I,Tianna Badgett,acting as a Education administrator for [name], MD.,have documented all relevant documentation on the behalf of [name], MD,as directed by  [name], MD while in the presence of [name], MD.  This visit occurred during the SARS-CoV-2 public health emergency.  Safety protocols were in place, including screening questions prior to the visit, additional usage of staff PPE, and extensive cleaning of exam room while observing appropriate contact time as indicated for disinfecting solutions.  Subjective:     Patient ID: Jessica Banks , female    DOB: November 24, 1981 , 40 y.o.   MRN: [id number]   Chief Complaint  Patient presents with   Anxiety    HPI  She is here today for f/u anxiety.  She was previously started on sertraline. She feels well on her current dose. She is also now in therapy. She feels that she has had an improvement in sx.   Anxiety Presents for follow-up visit. Symptoms include nervous/anxious behavior. Patient reports no chest pain or feeling of choking. Symptoms occur occasionally. The quality of sleep is fair. Nighttime awakenings: none.   Compliance with medications is 76-100%.    Past Medical History:  Diagnosis Date   Abnormal Pap smear    CIN I   Anemia    Anxiety    Depression    off meds now, ok now   Enlarged thyroid gland    nromal function   GERD (gastroesophageal reflux disease)    Migraine    Obesity    Ovarian cyst    Vaginal Pap smear, abnormal    ok now     Family History  Problem Relation Age of Onset   Thyroid disease Mother        s/p thyroidectomy   Arthritis Mother    Leukemia Maternal Grandmother    Stroke Maternal Grandmother    Cancer Maternal Grandfather        lung   Heart disease Paternal Grandmother    COPD Paternal Grandfather    Hypertension Maternal Uncle    Diabetes Maternal Uncle      Current Outpatient Medications:    Biotin w/ Vitamins C & E (HAIR/SKIN/NAILS PO), Take by mouth. 1  per day, Disp: , Rfl:    cholecalciferol (VITAMIN D3) 25 MCG (1000 UNIT) tablet, Take by mouth daily., Disp: , Rfl:    DEXILANT 60 MG capsule, TAKE 1 CAPSULE BY MOUTH EVERY DAY, Disp: 30 capsule, Rfl: 3   ibuprofen (ADVIL) 600 MG tablet, Take 600 mg by mouth every 6 (six) hours as needed for headache or moderate pain., Disp: , Rfl:    magnesium oxide (MAG-OX) 400 MG tablet, Take 400 mg by mouth daily., Disp: , Rfl:    Multiple Vitamin (MULTIVITAMIN WITH MINERALS) TABS tablet, Take 1 tablet by mouth daily., Disp: , Rfl:    sertraline (ZOLOFT) 50 MG tablet, One tab daily., Disp: 90 tablet, Rfl: 2   Allergies  Allergen Reactions   Novocain [Procaine] Anaphylaxis     Review of Systems  Constitutional: Negative.   Respiratory: Negative.    Cardiovascular: Negative.  Negative for chest pain.  Gastrointestinal: Negative.   Neurological: Negative.   Psychiatric/Behavioral:  The patient is nervous/anxious.     Today's Vitals   08/10/21 1146  BP: 118/68  Pulse: 77  Temp: 98.4 F (36.9 C)  TempSrc: Oral  Weight: 236 lb 6.4 oz (107.2 kg)  Height: 5' 0.8" (1.544 m)   Body mass index is 44.96 kg/m.  Wt Readings from Last 3 Encounters:  08/10/21 236 lb 6.4 oz (107.2 kg)  03/03/21 228 lb 3.2 oz (103.5 kg)  01/06/21 225 lb 3.2 oz (102.2 kg)    Objective:  Physical Exam Vitals and nursing note reviewed.  Constitutional:      Appearance: Normal appearance.  HENT:     Head: Normocephalic and atraumatic.     Nose:     Comments: Masked     Mouth/Throat:     Comments: Masked Eyes:     Extraocular Movements: Extraocular movements intact.  Cardiovascular:     Rate and Rhythm: Normal rate and regular rhythm.     Heart sounds: Normal heart sounds.  Pulmonary:     Effort: Pulmonary effort is normal.     Breath sounds: Normal breath sounds.  Musculoskeletal:     Cervical back: Normal range of motion.  Skin:    General: Skin is warm.  Neurological:     General: No focal deficit  present.     Mental Status: She is alert.  Psychiatric:        Mood and Affect: Mood normal.        Behavior: Behavior normal.        Assessment And Plan:     1. Anxiety Comments: Improved with sertraline. I will send in refill. She will f/u in 4-6 months.   2. Goiter Comments: Most recent thyroid u/s results reviewed in detail.   3. Migraine without aura and without status migrainosus, not intractable Comments: Encouraged to take Mg supplements. Unable to take Nurtec while breast-feeding.   4. Class 3 severe obesity due to excess calories without serious comorbidity with body mass index (BMI) of 40.0 to 44.9 in adult [facility]) She is encouraged to strive for BMI less than 30 to decrease cardiac risk. Advised to aim for at least 150 minutes of exercise per week.   Patient was given opportunity to ask questions. Patient verbalized understanding of the plan and was able to repeat key elements of the plan. All questions were answered to their satisfaction.   I, [name], MD, have reviewed all documentation for this visit. The documentation on 08/15/21 for the exam, diagnosis, procedures, and orders are all accurate and complete.   IF YOU HAVE BEEN REFERRED TO A SPECIALIST, IT MAY TAKE 1-2 WEEKS TO SCHEDULE/PROCESS THE REFERRAL. IF YOU HAVE NOT HEARD FROM US/SPECIALIST IN TWO WEEKS, PLEASE GIVE [place] A CALL AT [phone].   THE PATIENT IS ENCOURAGED TO PRACTICE SOCIAL DISTANCING DUE TO THE COVID-19 PANDEMIC.

## 2021-08-10 NOTE — Patient Instructions (Signed)

## 2021-08-24 DIAGNOSIS — K219 Gastro-esophageal reflux disease without esophagitis: Secondary | ICD-10-CM | POA: Diagnosis not present

## 2021-08-24 DIAGNOSIS — Z9884 Bariatric surgery status: Secondary | ICD-10-CM | POA: Diagnosis not present

## 2021-08-26 DIAGNOSIS — F411 Generalized anxiety disorder: Secondary | ICD-10-CM | POA: Diagnosis not present

## 2021-09-07 ENCOUNTER — Other Ambulatory Visit: Payer: Self-pay | Admitting: Internal Medicine

## 2021-09-12 DIAGNOSIS — F32A Depression, unspecified: Secondary | ICD-10-CM | POA: Diagnosis not present

## 2021-09-12 DIAGNOSIS — F419 Anxiety disorder, unspecified: Secondary | ICD-10-CM | POA: Diagnosis not present

## 2021-09-12 DIAGNOSIS — K449 Diaphragmatic hernia without obstruction or gangrene: Secondary | ICD-10-CM | POA: Diagnosis not present

## 2021-09-12 DIAGNOSIS — K219 Gastro-esophageal reflux disease without esophagitis: Secondary | ICD-10-CM | POA: Diagnosis not present

## 2021-09-12 DIAGNOSIS — Z9884 Bariatric surgery status: Secondary | ICD-10-CM | POA: Diagnosis not present

## 2021-09-12 DIAGNOSIS — K3189 Other diseases of stomach and duodenum: Secondary | ICD-10-CM | POA: Diagnosis not present

## 2021-09-12 DIAGNOSIS — R9431 Abnormal electrocardiogram [ECG] [EKG]: Secondary | ICD-10-CM | POA: Diagnosis not present

## 2021-09-12 DIAGNOSIS — Z6837 Body mass index (BMI) 37.0-37.9, adult: Secondary | ICD-10-CM | POA: Diagnosis not present

## 2021-09-12 DIAGNOSIS — Z79899 Other long term (current) drug therapy: Secondary | ICD-10-CM | POA: Diagnosis not present

## 2021-10-05 DIAGNOSIS — F411 Generalized anxiety disorder: Secondary | ICD-10-CM | POA: Diagnosis not present

## 2021-10-11 DIAGNOSIS — F411 Generalized anxiety disorder: Secondary | ICD-10-CM | POA: Diagnosis not present

## 2021-10-27 ENCOUNTER — Other Ambulatory Visit: Payer: Self-pay

## 2021-10-27 ENCOUNTER — Encounter: Payer: Self-pay | Admitting: Internal Medicine

## 2021-10-27 ENCOUNTER — Ambulatory Visit: Payer: BC Managed Care – PPO | Admitting: Internal Medicine

## 2021-10-27 VITALS — BP 118/80 | HR 89 | Temp 98.8°F | Ht 60.8 in | Wt 245.4 lb

## 2021-10-27 DIAGNOSIS — K449 Diaphragmatic hernia without obstruction or gangrene: Secondary | ICD-10-CM | POA: Diagnosis not present

## 2021-10-27 DIAGNOSIS — E049 Nontoxic goiter, unspecified: Secondary | ICD-10-CM

## 2021-10-27 DIAGNOSIS — F419 Anxiety disorder, unspecified: Secondary | ICD-10-CM | POA: Diagnosis not present

## 2021-10-27 DIAGNOSIS — Z6841 Body Mass Index (BMI) 40.0 and over, adult: Secondary | ICD-10-CM

## 2021-10-27 NOTE — Progress Notes (Signed)
I,[name],acting as a scribe for [name], MD.,have documented all relevant documentation on the behalf of [name], MD,as directed by  [name], MD while in the presence of [name], MD.  This visit occurred during the SARS-CoV-2 public health emergency.  Safety protocols were in place, including screening questions prior to the visit, additional usage of staff PPE, and extensive cleaning of exam room while observing appropriate contact time as indicated for disinfecting solutions.  Subjective:     Patient ID: Jessica Banks , female    DOB: 23-Nov-1981 , 40 y.o.   MRN: [id number]   Chief Complaint  Patient presents with   Anxiety     HPI  Pt here for anxiety f/u.  She feels well on sertraline, wants to continue with meds.     Past Medical History:  Diagnosis Date   Abnormal Pap smear    CIN I   Anemia    Anxiety    Depression    off meds now, ok now   Enlarged thyroid gland    nromal function   GERD (gastroesophageal reflux disease)    Migraine    Obesity    Ovarian cyst    Vaginal Pap smear, abnormal    ok now     Family History  Problem Relation Age of Onset   Thyroid disease Mother        s/p thyroidectomy   Arthritis Mother    Leukemia Maternal Grandmother    Stroke Maternal Grandmother    Cancer Maternal Grandfather        lung   Heart disease Paternal Grandmother    COPD Paternal Grandfather    Hypertension Maternal Uncle    Diabetes Maternal Uncle      Current Outpatient Medications:    Biotin w/ Vitamins C & E (HAIR/SKIN/NAILS PO), Take by mouth. 1 per day, Disp: , Rfl:    cholecalciferol (VITAMIN D3) 25 MCG (1000 UNIT) tablet, Take by mouth daily., Disp: , Rfl:    dexlansoprazole (DEXILANT) 60 MG capsule, TAKE 1 CAPSULE BY MOUTH EVERY DAY, Disp: 30 capsule, Rfl: 3   ibuprofen (ADVIL) 600 MG tablet, Take 600 mg by mouth every 6 (six) hours as needed for headache or moderate pain., Disp: , Rfl:    magnesium  oxide (MAG-OX) 400 MG tablet, Take 400 mg by mouth daily., Disp: , Rfl:    Multiple Vitamin (MULTIVITAMIN WITH MINERALS) TABS tablet, Take 1 tablet by mouth daily., Disp: , Rfl:    sertraline (ZOLOFT) 50 MG tablet, One tab daily., Disp: 90 tablet, Rfl: 2   Allergies  Allergen Reactions   Novocain [Procaine] Anaphylaxis   Betadine [Povidone Iodine] Rash     Review of Systems  Constitutional: Negative.   Respiratory: Negative.    Cardiovascular: Negative.   Gastrointestinal: Negative.   Endocrine:       Feels like her thyroid has gotten bigger. Denies having difficulty swallowing  Neurological: Negative.   Psychiatric/Behavioral: Negative.      Today's Vitals   10/27/21 1554  BP: 118/80  Pulse: 89  Temp: 98.8 F (37.1 C)  Weight: 245 lb 6.4 oz (111.3 kg)  Height: 5' 0.8" (1.544 m)  PainSc: 0-No pain   Body mass index is 46.67 kg/m.   Objective:  Physical Exam Vitals and nursing note reviewed.  Constitutional:      Appearance: Normal appearance.  HENT:     Head: Normocephalic and atraumatic.     Nose:  Comments: Masked     Mouth/Throat:     Comments: Masked  Eyes:     Extraocular Movements: Extraocular movements intact.  Neck:     Thyroid: Thyromegaly present.  Cardiovascular:     Rate and Rhythm: Normal rate and regular rhythm.     Heart sounds: Normal heart sounds.  Pulmonary:     Effort: Pulmonary effort is normal.     Breath sounds: Normal breath sounds.  Musculoskeletal:     Cervical back: Normal range of motion.  Skin:    General: Skin is warm.  Neurological:     General: No focal deficit present.     Mental Status: She is alert.  Psychiatric:        Mood and Affect: Mood normal.        Behavior: Behavior normal.        Assessment And Plan:     1. Anxiety Comments: Chronic, she will c/w sertraline. She will f/u in 4-6 months for re-evaluation.   2. Hiatal hernia Comments: Plans to proceed with gastric sleeve switch to decrease her sx.  Advised to stop eating 3 hours prior to going to bed.   3. Goiter Comments: She feels this has grown in size. Wants to take meds to help shrink size. I will refer her to Endo for further evaluation.  - TSH - T4, free - T3, free - Thyroid Peroxidase Antibody  4. Class 3 severe obesity due to excess calories with serious comorbidity and body mass index (BMI) of 45.0 to 49.9 in adult [facility]) Comments: BMI 46. She is encouraged to incorporate more exercise into her daily routine, aiming for at least 150 minutes of exercise per week.     Patient was given opportunity to ask questions. Patient verbalized understanding of the plan and was able to repeat key elements of the plan. All questions were answered to their satisfaction.   I, [name], MD, have reviewed all documentation for this visit. The documentation on 10/27/21 for the exam, diagnosis, procedures, and orders are all accurate and complete.   IF YOU HAVE BEEN REFERRED TO A SPECIALIST, IT MAY TAKE 1-2 WEEKS TO SCHEDULE/PROCESS THE REFERRAL. IF YOU HAVE NOT HEARD FROM US/SPECIALIST IN TWO WEEKS, PLEASE GIVE [place] A CALL AT [phone].   THE PATIENT IS ENCOURAGED TO PRACTICE SOCIAL DISTANCING DUE TO THE COVID-19 PANDEMIC.

## 2021-10-28 LAB — THYROID PEROXIDASE ANTIBODY: Thyroperoxidase Ab SerPl-aCnc: 11 IU/mL (ref 0–34)

## 2021-10-28 LAB — T3, FREE: T3, Free: 2.7 pg/mL (ref 2.0–4.4)

## 2021-10-28 LAB — TSH: TSH: 0.846 u[IU]/mL (ref 0.450–4.500)

## 2021-10-28 LAB — T4, FREE: Free T4: 0.94 ng/dL (ref 0.82–1.77)

## 2021-10-31 ENCOUNTER — Encounter: Payer: Self-pay | Admitting: Internal Medicine

## 2021-11-01 ENCOUNTER — Other Ambulatory Visit: Payer: Self-pay

## 2021-11-01 DIAGNOSIS — E041 Nontoxic single thyroid nodule: Secondary | ICD-10-CM

## 2021-11-01 DIAGNOSIS — E049 Nontoxic goiter, unspecified: Secondary | ICD-10-CM

## 2021-11-15 DIAGNOSIS — F411 Generalized anxiety disorder: Secondary | ICD-10-CM | POA: Diagnosis not present

## 2021-11-18 ENCOUNTER — Encounter: Payer: Self-pay | Admitting: Internal Medicine

## 2021-11-27 DIAGNOSIS — F411 Generalized anxiety disorder: Secondary | ICD-10-CM | POA: Diagnosis not present

## 2022-01-02 DIAGNOSIS — F411 Generalized anxiety disorder: Secondary | ICD-10-CM | POA: Diagnosis not present

## 2022-01-03 DIAGNOSIS — F411 Generalized anxiety disorder: Secondary | ICD-10-CM | POA: Diagnosis not present

## 2022-01-17 ENCOUNTER — Other Ambulatory Visit: Payer: Self-pay | Admitting: Internal Medicine

## 2022-01-18 ENCOUNTER — Other Ambulatory Visit: Payer: Self-pay | Admitting: Internal Medicine

## 2022-01-25 ENCOUNTER — Encounter: Payer: Self-pay | Admitting: Internal Medicine

## 2022-01-27 ENCOUNTER — Other Ambulatory Visit: Payer: Self-pay | Admitting: Internal Medicine

## 2022-01-27 MED ORDER — DEXLANSOPRAZOLE 60 MG PO CPDR: 60.0000 mg | DELAYED_RELEASE_CAPSULE | Freq: Every day | ORAL | 3 refills | Status: DC

## 2022-01-30 ENCOUNTER — Other Ambulatory Visit: Payer: Self-pay

## 2022-01-30 ENCOUNTER — Emergency Department (HOSPITAL_BASED_OUTPATIENT_CLINIC_OR_DEPARTMENT_OTHER): Payer: BC Managed Care – PPO | Admitting: Radiology

## 2022-01-30 ENCOUNTER — Other Ambulatory Visit: Payer: Self-pay | Admitting: Internal Medicine

## 2022-01-30 ENCOUNTER — Emergency Department (HOSPITAL_BASED_OUTPATIENT_CLINIC_OR_DEPARTMENT_OTHER)
Admission: EM | Admit: 2022-01-30 | Discharge: 2022-01-30 | Disposition: A | Discharge status: Home / Self Care | Payer: BC Managed Care – PPO | Attending: Emergency Medicine | Admitting: Emergency Medicine

## 2022-01-30 ENCOUNTER — Encounter (HOSPITAL_BASED_OUTPATIENT_CLINIC_OR_DEPARTMENT_OTHER): Payer: Self-pay

## 2022-01-30 DIAGNOSIS — R079 Chest pain, unspecified: Secondary | ICD-10-CM

## 2022-01-30 DIAGNOSIS — R0789 Other chest pain: Secondary | ICD-10-CM | POA: Insufficient documentation

## 2022-01-30 LAB — BASIC METABOLIC PANEL
Anion gap: 8 (ref 5–15)
BUN: 18 mg/dL (ref 6–20)
CO2: 26 mmol/L (ref 22–32)
Calcium: 9.6 mg/dL (ref 8.9–10.3)
Chloride: 104 mmol/L (ref 98–111)
Creatinine, Ser: 0.64 mg/dL (ref 0.44–1.00)
GFR, Estimated: >60 mL/min (ref >60)
Glucose, Bld: 113 mg/dL — ABNORMAL HIGH (ref 70–99)
Potassium: 4 mmol/L (ref 3.5–5.1)
Sodium: 138 mmol/L (ref 135–145)

## 2022-01-30 LAB — TROPONIN I (HIGH SENSITIVITY)
Troponin I (High Sensitivity): 2 ng/L (ref <18)
Troponin I (High Sensitivity): 3 ng/L (ref <18)

## 2022-01-30 LAB — CBC
HCT: 35.8 % — ABNORMAL LOW (ref 36.0–46.0)
Hemoglobin: 10.7 g/dL — ABNORMAL LOW (ref 12.0–15.0)
MCH: 26 pg (ref 26.0–34.0)
MCHC: 29.9 g/dL — ABNORMAL LOW (ref 30.0–36.0)
MCV: 86.9 fL (ref 80.0–100.0)
Platelets: 444 10*3/uL — ABNORMAL HIGH (ref 150–400)
RBC: 4.12 MIL/uL (ref 3.87–5.11)
RDW: 14.3 % (ref 11.5–15.5)
WBC: 8.5 10*3/uL (ref 4.0–10.5)
nRBC: 0 % (ref 0.0–0.2)

## 2022-01-30 LAB — D-DIMER, QUANTITATIVE: D-Dimer, Quant: 0.36 ug/mL-FEU (ref 0.00–0.50)

## 2022-01-30 LAB — PREGNANCY, URINE: Preg Test, Ur: NEGATIVE

## 2022-01-30 MED ORDER — LIDOCAINE VISCOUS HCL 2 % MT SOLN
15.0000 mL | Freq: Once | OROMUCOSAL | Status: AC
  Administered 2022-01-30: 15 mL via ORAL
  Filled 2022-01-30: qty 15

## 2022-01-30 MED ORDER — PANTOPRAZOLE SODIUM 40 MG PO TBEC: 40.0000 mg | DELAYED_RELEASE_TABLET | Freq: Two times a day (BID) | ORAL | 1 refills | Status: DC

## 2022-01-30 MED ORDER — ALUM & MAG HYDROXIDE-SIMETH 200-200-20 MG/5ML PO SUSP
30.0000 mL | Freq: Once | ORAL | Status: AC
Start: 2022-01-30 — End: 2022-01-30
  Administered 2022-01-30: 30 mL via ORAL
  Filled 2022-01-30: qty 30

## 2022-01-30 MED ORDER — IBUPROFEN 400 MG PO TABS
400.0000 mg | ORAL_TABLET | Freq: Once | ORAL | Status: AC
  Administered 2022-01-30: 400 mg via ORAL
  Filled 2022-01-30: qty 1

## 2022-01-30 NOTE — ED Provider Notes (Signed)
[facility] EMERG[facility] DEPT Provider Note   CSN: [id number] Arrival date & time: 01/30/22  1632     History  Chief Complaint  Patient presents with   Chest Pain    Jessica Banks is a 41 y.o. female.   Chest Pain 3 days of intermittent CP. Seems to last 2-3 minutes at a time.  Had a couple of episodes over the past 2 days but today has had 10 episodes which prompted her to come to the emergency room.  Initially was a pressure (mild). Left sided. Pinpoint. Maybe radiates to left shoulder. Non-exertional or pleuritic.  No cough.   No recent surgeries, hospitalization, long travel, hemoptysis, estrogen containing OCP, cancer history.  No unilateral leg swelling.  No history of PE or VTE.        Home Medications Prior to Admission medications   Medication Sig Start Date End Date Taking? Authorizing Provider  Biotin w/ Vitamins C & E (HAIR/SKIN/NAILS PO) Take by mouth. 1 per day    [provider]  cholecalciferol (VITAMIN D3) 25 MCG (1000 UNIT) tablet Take by mouth daily.    [provider]  ibuprofen (ADVIL) 600 MG tablet Take 600 mg by mouth every 6 (six) hours as needed for headache or moderate pain.    [provider]  magnesium oxide (MAG-OX) 400 MG tablet Take 400 mg by mouth daily.    [provider]  Multiple Vitamin (MULTIVITAMIN WITH MINERALS) TABS tablet Take 1 tablet by mouth daily.    [provider]  pantoprazole (PROTONIX) 40 MG tablet Take 1 tablet (40 mg total) by mouth 2 (two) times daily. 01/30/22   Glendale Chard, MD  sertraline (ZOLOFT) 50 MG tablet One tab daily. 08/10/21   Glendale Chard, MD      Allergies    Novocain [procaine] and Betadine [povidone iodine]    Review of Systems   Review of Systems  Cardiovascular:  Positive for chest pain.   Physical Exam Updated Vital Signs BP 122/67    Pulse 80    Temp 97.7 F (36.5 C) (Temporal)    Resp 17    Ht 5' (1.524 m)    Wt 111.3 kg    LMP  01/26/2022 (Exact Date)    SpO2 100%    BMI 47.92 kg/m  Physical Exam Vitals and nursing note reviewed.  Constitutional:      General: She is not in acute distress.    Appearance: She is obese.     Comments: Pleasant well-appearing 41 year old.   Able answer questions appropriately follow commands. No increased work of breathing. Speaking in full sentences.   HENT:     Head: Normocephalic and atraumatic.     Nose: Nose normal.  Eyes:     General: No scleral icterus. Cardiovascular:     Rate and Rhythm: Normal rate and regular rhythm.     Pulses: Normal pulses.     Heart sounds: Normal heart sounds.  Pulmonary:     Effort: Pulmonary effort is normal. No respiratory distress.     Breath sounds: Normal breath sounds. No wheezing.  Abdominal:     Palpations: Abdomen is soft.     Tenderness: There is no abdominal tenderness. There is no guarding or rebound.  Musculoskeletal:     Cervical back: Normal range of motion.     Right lower leg: No edema.     Left lower leg: No edema.  Skin:    General: Skin is warm and dry.  Capillary Refill: Capillary refill takes less than 2 seconds.  Neurological:     Mental Status: She is alert. Mental status is at baseline.  Psychiatric:        Mood and Affect: Mood normal.        Behavior: Behavior normal.    ED Results / Procedures / Treatments   Labs (all labs ordered are listed, but only abnormal results are displayed) Labs Reviewed  BASIC METABOLIC PANEL - Abnormal; Notable for the following components:      Result Value   Glucose, Bld 113 (*)    All other components within normal limits  CBC - Abnormal; Notable for the following components:   Hemoglobin 10.7 (*)    HCT 35.8 (*)    MCHC 29.9 (*)    Platelets 444 (*)    All other components within normal limits  RESP PANEL BY RT-PCR (FLU A&B, COVID) ARPGX2  D-DIMER, QUANTITATIVE  PREGNANCY, URINE  TROPONIN I (HIGH SENSITIVITY)  TROPONIN I (HIGH SENSITIVITY)    EKG EKG  Interpretation  Date/Time:  Monday January 30 2022 16:38:47 EST Ventricular Rate:  82 PR Interval:  136 QRS Duration: 78 QT Interval:  350 QTC Calculation: 408 R Axis:   53 Text Interpretation: Normal sinus rhythm with sinus arrhythmia T wave abnormality, consider inferior ischemia Abnormal ECG When compared with ECG of 23-Apr-2019 02:02, PREVIOUS ECG IS PRESENT Confirmed by Lennice Sites ([id number]) on 01/30/2022 4:46:35 PM  Radiology DG Chest 2 View  Result Date: 01/30/2022 CLINICAL DATA:  Chest pain EXAM: CHEST - 2 VIEW COMPARISON:  Radiograph 04/23/2019, chest CT 04/23/2019 FINDINGS: The heart size and mediastinal contours are within normal limits. Both lungs are clear. The visualized skeletal structures are unremarkable. IMPRESSION: No evidence of acute cardiopulmonary disease. Electronically Signed   By: Maurine Simmering M.D.   On: 01/30/2022 16:58    Procedures Procedures    Medications Ordered in ED Medications  alum & mag hydroxide-simeth (MAALOX/MYLANTA) 200-200-20 MG/5ML suspension 30 mL (30 mLs Oral Given 01/30/22 1808)    And  lidocaine (XYLOCAINE) 2 % viscous mouth solution 15 mL (15 mLs Oral Given 01/30/22 1808)  ibuprofen (ADVIL) tablet 400 mg (400 mg Oral Given 01/30/22 1806)    ED Course/ Medical Decision Making/ A&P Clinical Course as of 01/30/22 1833  Mon Jan 30, 2022  1724 3 days of intermittent CP. Seems to last 2-3 minutes at a time.   Initially was a pressure (mild). Left sided. Pinpoint. Maybe radiates to left shoulder. Non-exertional or pleuritic.  No cough.   No recent surgeries, hospitalization, long travel, hemoptysis, estrogen containing OCP, cancer history.  No unilateral leg swelling.  No history of PE or VTE.  [WF]  1752 CBC(!) Unremarkable - reviewed. [WF]  8502 D-Dimer, Quant: 0.36 [WF]  7741 Basic metabolic panel(!) [WF]  2878 IMPRESSION: No evidence of acute cardiopulmonary disease.   [WF]  1832 Troponin I (High Sensitivity): 2 [WF]    Clinical  Course User Index [WF] Tedd Sias, PA                           Medical Decision Making Amount and/or Complexity of Data Reviewed Labs: ordered. Decision-making details documented in ED Course. Radiology: ordered.  Risk OTC drugs. Prescription drug management.    This patient presents to the ED for concern of CP, this involves a number of treatment options, and is a complaint that carries with it a high risk of complications  and morbidity.  The differential diagnosis includes The emergent causes of chest pain include: Acute coronary syndrome, tamponade, pericarditis/myocarditis, aortic dissection, pulmonary embolism, tension pneumothorax, pneumonia, and esophageal rupture.    Co morbidities: Discussed in HPI   Brief History:  Ongoing intermittent chest pain for 3 days  History of cardiac work-up in the past which has been reassuring.  She has had negative stress test several years ago.  She did have an ultrasound/echocardiogram done which showed some valvular leakage but no other acute abnormalities.  EMR reviewed including pt PMHx, past surgical history and past visits to ER.   See HPI for more details   Lab Tests:  I ordered and independently interpreted labs.  The pertinent results include:    Labs notable for D-dimer within normal low suspicion for PE.  Troponin x2 within normal limits.  CBC without leukocytosis and without significant BMP unremarkable patient declined COVID influenza test.  Negative for pregnancy by urine pregnancy   Imaging Studies:  NAD. I personally reviewed all imaging studies and no acute abnormality found. I agree with radiology interpretation. Chest x-ray unremarkable   Cardiac Monitoring:  The patient was maintained on a cardiac monitor.  I personally viewed and interpreted the cardiac monitored which showed an underlying rhythm of: NSR EKG non-ischemic some inferior and lateral T wave inversions however these do not appear to be  acute.  She has a history of similar T wave inversions although they seem to be new T wave inversion V5 V6   Medicines ordered:  I ordered medication including GI cocktail, ibuprofen for chest pain Reevaluation of the patient after these medicines showed that the patient stayed the same currently not having pain but patient does not attribute her pain-free status his medications.  Her pain has been intermittent. I have reviewed the patients home medicines and have made adjustments as needed   Critical Interventions:     Attending:  Discussed patient briefly with my attending physician who reviewed EKGs and agrees my plan.    Reevaluation:  After the interventions noted above I re-evaluated patient and found that they have :stayed the same   Social Determinants of Health:  The patient's social determinants of health were a factor in the care of this patient    Problem List / ED Course:  Atypical chest pain   Dispostion:  After consideration of the diagnostic results and the patients response to treatment, I feel that the patent would benefit from follow-up with cardiology and PCP   Ultimately patient with reassuring work-up.  Understands that for any new or concerning symptoms she will return immediately to the ER   Final Clinical Impression(s) / ED Diagnoses Final diagnoses:  None    Rx / DC Orders ED Discharge Orders     None         Tedd Sias, [place] 01/30/22 2047    Lennice Sites, DO 01/30/22 2246

## 2022-01-30 NOTE — ED Notes (Signed)
Pt refused a COVID/Flu test.

## 2022-01-30 NOTE — ED Notes (Addendum)
Pt stated that she would not like to be swabbed right now. She stated that she does not think that she has Covid or the Flu.

## 2022-01-30 NOTE — ED Triage Notes (Signed)
Patient here POV from Home with CP.  Pain has been present for 3 days.  Pain is Left Chest and radiates to Left Torso. No Other Symptoms.  NAD Noted during Triage. A&Ox4. GCS 15. Ambulatory.

## 2022-01-30 NOTE — ED Notes (Signed)
ED[facility] Provider at bedside.

## 2022-01-30 NOTE — Discharge Instructions (Addendum)
Please follow-up with a cardiologist I given you the information for the [facility] health cardiology group it seems that the last time you were seen by cardiologist was 2018 with [facility]   Please monitor your symptoms and return for any new or concerning symptoms.  Take all your medications as prescribed including your Protonix.  You might try taking 1000 mg of Tylenol or 600 mg ibuprofen as needed for pain however you have a very low threshold for return to the ER for reevaluation should your symptoms change or worsen.

## 2022-02-14 DIAGNOSIS — F411 Generalized anxiety disorder: Secondary | ICD-10-CM | POA: Diagnosis not present

## 2022-02-15 DIAGNOSIS — F411 Generalized anxiety disorder: Secondary | ICD-10-CM | POA: Diagnosis not present

## 2022-02-16 ENCOUNTER — Encounter: Payer: Self-pay | Admitting: Internal Medicine

## 2022-02-16 ENCOUNTER — Ambulatory Visit: Payer: BC Managed Care – PPO | Admitting: Internal Medicine

## 2022-02-16 ENCOUNTER — Other Ambulatory Visit: Payer: Self-pay

## 2022-02-16 ENCOUNTER — Ambulatory Visit (INDEPENDENT_AMBULATORY_CARE_PROVIDER_SITE_OTHER): Payer: BC Managed Care – PPO | Admitting: Internal Medicine

## 2022-02-16 VITALS — BP 110/70 | HR 81 | Ht 60.0 in | Wt 244.8 lb

## 2022-02-16 DIAGNOSIS — R079 Chest pain, unspecified: Secondary | ICD-10-CM | POA: Diagnosis not present

## 2022-02-16 NOTE — Progress Notes (Signed)
?Cardiology Office Note:   ? ?Date:  02/16/2022  ? ?ID:  Jessica Banks, DOB 03-07-1981, MRN [id number] ? ?PCP:  Glendale Chard, MD ?  ?[facility] Providers ?Cardiologist:  Janina Mayo, MD    ? ?Referring MD: Glendale Chard, MD  ? ?No chief complaint on file. ?Atypical CP ? ?History of Present Illness:   ? ?Jessica Banks is a 41 y.o. female with a hx GERD, obesity ? ?She was at work and she felt chest discomfort. It lasted a few seconds. In the next couple of days the frequency increased. They last a few seconds, not long. No nausea. Not associated with activity. She at times feels tingling in her L hand. On 2/13 the episodes increased in frequency. 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 She went to the ED 01/30/2022 with TWI noted in the inferior leads . Troponins were negative. ACS was ruled out. ? ?She has no hx of DM2 or HTN.  ? ?Family Hx: no heart disease for parents.Father passed away after bee sting anaphylaxis.  ? ?Social Hx: no smoking ? ? ?02/01/2022- NSR, inferior TWI ?TWI in the inferior leads seen 07/2019 ? ?Echo 2018- normal LV function, no valve dx ?Indication was for presyncope during pregnancy ? ?Echo 8/13/202- Ef 50, no valve disease ?Indication - panic attack ? ?Past Medical History:  ?Diagnosis Date  ? Abnormal Pap smear   ? CIN I  ? Anemia   ? Anxiety   ? Depression   ? off meds now, ok now  ? Enlarged thyroid gland   ? nromal function  ? GERD (gastroesophageal reflux disease)   ? Migraine   ? Obesity   ? Ovarian cyst   ? Vaginal Pap smear, abnormal   ? ok now  ? ? ?Past Surgical History:  ?Procedure Laterality Date  ? LAPAROSCOPIC GASTRIC SLEEVE RESECTION    ? ? ?Current Medications: ?Current Meds  ?Medication Sig  ? Biotin w/ Vitamins C & E (HAIR/SKIN/NAILS PO) Take by mouth. 1 per day  ? butalbital-acetaminophen-caffeine (FIORICET) 50-325-40 MG tablet Take 2 tablets by mouth every 4 (four) hours as needed.  ? cholecalciferol (VITAMIN D3) 25 MCG (1000 UNIT) tablet Take by mouth  daily.  ? cyclobenzaprine (FLEXERIL) 10 MG tablet Take 10 mg by mouth as needed.  ? dexlansoprazole (DEXILANT) 60 MG capsule Take 60 mg by mouth daily.  ? ibuprofen (ADVIL) 600 MG tablet Take 600 mg by mouth every 6 (six) hours as needed for headache or moderate pain.  ? magnesium oxide (MAG-OX) 400 MG tablet Take 400 mg by mouth daily.  ? Multiple Vitamin (MULTIVITAMIN WITH MINERALS) TABS tablet Take 1 tablet by mouth daily.  ? sertraline (ZOLOFT) 50 MG tablet One tab daily.  ?  ? ?Allergies:   Novocain [procaine] and Povidone iodine  ? ?Social History  ? ?Socioeconomic History  ? Marital status: Married  ?  Spouse name: n/a  ? Number of children: 0  ? Years of education: master's  ? Highest education level: Master's degree (e.g., MA, MS, MEng, MEd, MSW, MBA)  ?Occupational History  ? Occupation: Therapist, sports  ?  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 Occupation: NP  ?  Comment: graduated/boards spring 2014  ?Tobacco Use  ? Smoking status: Former  ?  Years: 4.00  ?  Types: Cigarettes  ?  Quit date: 06/07/2013  ?  Years since quitting: 8.7  ? Smokeless tobacco: Never  ? Tobacco comments:  ?  only smoked on  the weekends  ?Vaping Use  ? Vaping Use: Never used  ?Substance and Sexual Activity  ? Alcohol use: Not Currently  ?  Comment: SOCIAL   ? Drug use: No  ? Sexual activity: Not Currently  ?Other Topics Concern  ? Not on file  ?Social History Narrative  ? Not on file  ? ?Social Determinants of Health  ? ?Financial Resource Strain: Not on file  ?Food Insecurity: Not on file  ?Transportation Needs: Not on file  ?Physical Activity: Not on file  ?Stress: Not on file  ?Social Connections: Not on file  ?  ? ?Family History: ?The patient's family history includes Arthritis in her mother; COPD in her paternal grandfather; Cancer in her maternal grandfather; Diabetes in her maternal uncle; Heart disease in her paternal grandmother; Hypertension in her maternal uncle; Leukemia in her maternal grandmother; Stroke in her maternal grandmother;  Thyroid disease in her mother. ? ?ROS:   ?Please see the history of present illness.    ? All other systems reviewed and are negative. ? ?EKGs/Labs/Other Studies Reviewed:   ? ?The following studies were reviewed today: ? ? ?EKG:  EKG is  ordered today.  The ekg ordered today demonstrates  ? ?NSR, TWI inferiorly ? ?Recent Labs: ?10/27/2021: TSH 0.846 ?01/30/2022: BUN 18; Creatinine, Ser 0.64; Hemoglobin 10.7; Platelets 444; Potassium 4.0; Sodium 138  ?Recent Lipid Panel ?No results found for: CHOL, TRIG, HDL, CHOLHDL, VLDL, LDLCALC, LDLDIRECT ? ? ?Risk Assessment/Calculations:   ?  ? ?    ? ?Physical Exam:   ? ?VS:  BP 110/70 (BP Location: Left Arm, Patient Position: Sitting)   Pulse 81   Ht 5' (1.524 m)   Wt 244 lb 12.8 oz (111 kg)   LMP 01/26/2022 (Exact Date)   SpO2 100%   BMI 47.81 kg/m?    ? ?Wt Readings from Last 3 Encounters:  ?02/16/22 244 lb 12.8 oz (111 kg)  ?01/30/22 245 lb 6 oz (111.3 kg)  ?10/27/21 245 lb 6.4 oz (111.3 kg)  ?  ? ?GEN:  Well nourished, well developed in no acute distress ?HEENT: Normal ?NECK: No JVD; No carotid bruits ?LYMPHATICS: No lymphadenopathy ?CARDIAC: RRR, no murmurs, rubs, gallops ?RESPIRATORY:  Clear to auscultation without rales, wheezing or rhonchi  ?ABDOMEN: Soft, non-tender, non-distended ?MUSCULOSKELETAL:  No edema; No deformity  ?SKIN: Warm and dry ?NEUROLOGIC:  Alert and oriented x 3 ?PSYCHIATRIC:  Normal affect  ? ?ASSESSMENT:   ? ?#Atypical CP: She notes upper chest pain for a few seconds and hand tingling. EKG is stable. Symptoms are atypical. The symptoms duration is short and they are not associated with activity. Her risk is low.  We discussed this, and signs of angina.  Recommend to continue with lifestyle modification and CVD risk mitigation (weight loss, yearly A1c, lipid monitoring); otherwise she does not have an indication to continue to follow with cardiology at this time. ?  ? ?PLAN:   ? ?In order of problems listed above: ? ?Follow up as needed ? ?    ? ?   ? ? ?Medication Adjustments/Labs and Tests Ordered: ?Current medicines are reviewed at length with the patient today.  Concerns regarding medicines are outlined above.  ?No orders of the defined types were placed in this encounter. ? ?No orders of the defined types were placed in this encounter. ? ? ?Patient Instructions  ?Medication Instructions:  ?No Changes In Medications at this time.  ?*If you need a refill on your cardiac medications before your next appointment,  please call your pharmacy* ? ?Follow-Up: ?At [facility], you and your health needs are our priority.  As part of our continuing mission to provide you with exceptional heart care, we have created designated Provider Care Teams.  These Care Teams include your primary Cardiologist (physician) and Advanced Practice Providers (APPs -  Physician Assistants and Nurse Practitioners) who all work together to provide you with the care you need, when you need it. ? ?Your next appointment:   ?AS NEEDED  ? ?The format for your next appointment:   ?In Person ? ?Provider:   ?Janina Mayo, MD    ? ?Signed, ?Janina Mayo, MD  ?02/16/2022 12:06 PM    ?[place] ?

## 2022-02-16 NOTE — Patient Instructions (Signed)

## 2022-02-23 DIAGNOSIS — F411 Generalized anxiety disorder: Secondary | ICD-10-CM | POA: Diagnosis not present

## 2022-03-02 DIAGNOSIS — K219 Gastro-esophageal reflux disease without esophagitis: Secondary | ICD-10-CM | POA: Diagnosis not present

## 2022-03-08 ENCOUNTER — Other Ambulatory Visit: Payer: Self-pay

## 2022-03-08 ENCOUNTER — Telehealth: Payer: Self-pay

## 2022-03-08 DIAGNOSIS — K219 Gastro-esophageal reflux disease without esophagitis: Secondary | ICD-10-CM

## 2022-03-08 DIAGNOSIS — K449 Diaphragmatic hernia without obstruction or gangrene: Secondary | ICD-10-CM

## 2022-03-08 NOTE — Telephone Encounter (Addendum)
Referral for esophagel manometry with pH impedance. ? ?Provider : Dr Durwin Glaze, MD ?                 [facility] ?                 [facility] ?                 [place] ?                 [place], [place] [address] ? ?Fax [phone] or [phone] ? ?Attempted to contact the patient and instructed for procedure. No answer. Left date and time information on her voicemail. Instructions mailed to the patient.  ?

## 2022-03-09 ENCOUNTER — Ambulatory Visit (INDEPENDENT_AMBULATORY_CARE_PROVIDER_SITE_OTHER): Payer: BC Managed Care – PPO | Admitting: Internal Medicine

## 2022-03-09 ENCOUNTER — Encounter: Payer: Self-pay | Admitting: Internal Medicine

## 2022-03-09 ENCOUNTER — Other Ambulatory Visit: Payer: Self-pay

## 2022-03-09 VITALS — BP 122/78 | HR 80 | Temp 98.5°F | Ht 60.0 in | Wt 244.6 lb

## 2022-03-09 DIAGNOSIS — G43009 Migraine without aura, not intractable, without status migrainosus: Secondary | ICD-10-CM | POA: Diagnosis not present

## 2022-03-09 DIAGNOSIS — Z6841 Body Mass Index (BMI) 40.0 and over, adult: Secondary | ICD-10-CM

## 2022-03-09 DIAGNOSIS — F5101 Primary insomnia: Secondary | ICD-10-CM | POA: Diagnosis not present

## 2022-03-09 DIAGNOSIS — Z0000 Encounter for general adult medical examination without abnormal findings: Secondary | ICD-10-CM

## 2022-03-09 DIAGNOSIS — R635 Abnormal weight gain: Secondary | ICD-10-CM | POA: Diagnosis not present

## 2022-03-09 DIAGNOSIS — Z1231 Encounter for screening mammogram for malignant neoplasm of breast: Secondary | ICD-10-CM

## 2022-03-09 MED ORDER — BUTALBITAL-APAP-CAFFEINE 50-325-40 MG PO TABS: 2.0000 | ORAL_TABLET | Freq: Four times a day (QID) | ORAL | 0 refills | Status: DC | PRN

## 2022-03-09 MED ORDER — BUTALBITAL-APAP-CAFFEINE 50-325-40 MG PO TABS: 2.0000 | ORAL_TABLET | ORAL | 1 refills | Status: DC | PRN

## 2022-03-09 MED ORDER — KETOROLAC TROMETHAMINE 60 MG/2ML IM SOLN
60.0000 mg | Freq: Once | INTRAMUSCULAR | Status: AC
  Administered 2022-03-09: 60 mg via INTRAMUSCULAR

## 2022-03-09 MED ORDER — TEMAZEPAM 15 MG PO CAPS: 15.0000 mg | ORAL_CAPSULE | Freq: Every evening | ORAL | 0 refills | Status: DC | PRN

## 2022-03-09 NOTE — Progress Notes (Deleted)
?Rich Brave Banks,acting as a Education administrator for Jessica Greenland, MD.,have documented all relevant documentation on the behalf of Jessica Greenland, MD,as directed by  Jessica Greenland, MD while in the presence of Jessica Greenland, MD.  ?This visit occurred during the SARS-CoV-2 public health emergency.  Safety protocols were in place, including screening questions prior to the visit, additional usage of staff PPE, and extensive cleaning of exam room while observing appropriate contact time as indicated for disinfecting solutions. ? ?Subjective:  ?  ? Patient ID: Jessica Banks , female    DOB: [birth year] , 41 y.o.   MRN: [id number] ? ? ?Chief Complaint  ?Patient presents with  ? Annual Exam  ? ? ?HPI ? ?She is here today for a full physical examination. She reports she no longer has a GYN at this time due to the office closing down. Patient also wants to discuss her headaches, and insomnia.  ?  ? ?Past Medical History:  ?Diagnosis Date  ? Abnormal Pap smear   ? CIN I  ? Anemia   ? Anxiety   ? Depression   ? off meds now, ok now  ? Enlarged thyroid gland   ? nromal function  ? GERD (gastroesophageal reflux disease)   ? Migraine   ? Obesity   ? Ovarian cyst   ? Vaginal Pap smear, abnormal   ? ok now  ?  ? ?Family History  ?Problem Relation Age of Onset  ? Thyroid disease Mother   ?     s/p thyroidectomy  ? Arthritis Mother   ? Leukemia Maternal Grandmother   ? Stroke Maternal Grandmother   ? Cancer Maternal Grandfather   ?     lung  ? Heart disease Paternal Grandmother   ? COPD Paternal Grandfather   ? Hypertension Maternal Uncle   ? Diabetes Maternal Uncle   ? ? ? ?Current Outpatient Medications:  ?  Biotin w/ Vitamins C & E (HAIR/SKIN/NAILS PO), Take by mouth. 1 per day, Disp: , Rfl:  ?  butalbital-acetaminophen-caffeine (FIORICET) 50-325-40 MG tablet, Take 2 tablets by mouth every 4 (four) hours as needed., Disp: , Rfl:  ?  cholecalciferol (VITAMIN D3) 25 MCG (1000 UNIT) tablet, Take by mouth daily., Disp: , Rfl:  ?   cyclobenzaprine (FLEXERIL) 10 MG tablet, Take 10 mg by mouth as needed., Disp: , Rfl:  ?  dexlansoprazole (DEXILANT) 60 MG capsule, Take 60 mg by mouth daily., Disp: , Rfl:  ?  ibuprofen (ADVIL) 600 MG tablet, Take 600 mg by mouth every 6 (six) hours as needed for headache or moderate pain., Disp: , Rfl:  ?  magnesium oxide (MAG-OX) 400 MG tablet, Take 400 mg by mouth daily., Disp: , Rfl:  ?  Multiple Vitamin (MULTIVITAMIN WITH MINERALS) TABS tablet, Take 1 tablet by mouth daily., Disp: , Rfl:  ?  sertraline (ZOLOFT) 50 MG tablet, One tab daily., Disp: 90 tablet, Rfl: 2  ? ?Allergies  ?Allergen Reactions  ? Novocain [Procaine] Anaphylaxis  ? Povidone Iodine Rash  ?  Rash  ?  ? ? ?The patient states she uses {contraceptive methods:5051} for birth control. Last LMP was No LMP recorded.. {Dysmenorrhea-menorrhagia:21918}. Negative for: breast discharge, breast lump(s), breast pain and breast self exam. Associated symptoms include abnormal vaginal bleeding. Pertinent negatives include abnormal bleeding (hematology), anxiety, decreased libido, depression, difficulty falling sleep, dyspareunia, history of infertility, nocturia, sexual dysfunction, sleep disturbances, urinary incontinence, urinary urgency, vaginal discharge and vaginal itching. Diet regular.The patient states her  exercise level is   ? Marland Kitchen The patient's tobacco use is:  ?Social History  ? ?Tobacco Use  ?Smoking Status Former  ? Years: 4.00  ? Types: Cigarettes  ? Quit date: 06/07/2013  ? Years since quitting: 8.7  ?Smokeless Tobacco Never  ?Tobacco Comments  ? only smoked on the weekends  ?Marland Kitchen She has been exposed to passive smoke. The patient's alcohol use is:  ?Social History  ? ?Substance and Sexual Activity  ?Alcohol Use Not Currently  ? Comment: SOCIAL   ?Marland Kitchen Additional information: Last pap ***, next one scheduled for ***.   ? ?Review of Systems  ?Constitutional: Negative.   ?HENT: Negative.    ?Eyes: Negative.   ?Respiratory: Negative.    ?Cardiovascular:  Negative.   ?Gastrointestinal: Negative.   ?Endocrine: Negative.   ?Genitourinary: Negative.   ?Musculoskeletal: Negative.   ?Skin: Negative.   ?Allergic/Immunologic: Negative.   ?Neurological: Negative.   ?Hematological: Negative.   ?Psychiatric/Behavioral: Negative.     ? ?Today's Vitals  ? 03/09/22 0856  ?BP: 122/78  ?Pulse: 80  ?Temp: 98.5 ?F (36.9 ?C)  ?Weight: 244 lb 9.6 oz (110.9 kg)  ?Height: 5' (1.524 m)  ?PainSc: 0-No pain  ? ?Body mass index is 47.77 kg/m?.  ?Wt Readings from Last 3 Encounters:  ?03/09/22 244 lb 9.6 oz (110.9 kg)  ?02/16/22 244 lb 12.8 oz (111 kg)  ?01/30/22 245 lb 6 oz (111.3 kg)  ?  ?Objective:  ?Physical Exam  ? ?   ?Assessment And Plan:  ?   ?1. Encounter for general adult medical examination w/o abnormal findings ? ?2. Migraine without aura and without status migrainosus, not intractable ? ?3. Insomnia, unspecified type ? ?4. Class 3 severe obesity due to excess calories with serious comorbidity and body mass index (BMI) of 45.0 to 49.9 in adult [facility]) ? ? ? ? ?Patient was given opportunity to ask questions. Patient verbalized understanding of the plan and was able to repeat key elements of the plan. All questions were answered to their satisfaction.  ? ?Jessica Banks, CMA  ? ?I, Jessica Banks, CMA, have reviewed all documentation for this visit. The documentation on 03/09/22 for the exam, diagnosis, procedures, and orders are all accurate and complete.  ?THE PATIENT IS ENCOURAGED TO PRACTICE SOCIAL DISTANCING DUE TO THE COVID-19 PANDEMIC.   ?

## 2022-03-09 NOTE — Patient Instructions (Signed)

## 2022-03-10 LAB — LIPID PANEL
Chol/HDL Ratio: 2.7 ratio (ref 0.0–4.4)
Cholesterol, Total: 200 mg/dL — ABNORMAL HIGH (ref 100–199)
HDL: 74 mg/dL (ref >39)
LDL Chol Calc (NIH): 117 mg/dL — ABNORMAL HIGH (ref 0–99)
Triglycerides: 48 mg/dL (ref 0–149)
VLDL Cholesterol Cal: 9 mg/dL (ref 5–40)

## 2022-03-10 LAB — MAGNESIUM: Magnesium: 2.1 mg/dL (ref 1.6–2.3)

## 2022-03-11 NOTE — Progress Notes (Signed)
?[name],acting as a Education administrator for Jessica Greenland, MD.,have documented all relevant documentation on the behalf of Jessica Greenland, MD,as directed by  Jessica Greenland, MD while in the presence of Jessica Greenland, MD.  ?This visit occurred during the SARS-CoV-2 public health emergency.  Safety protocols were in place, including screening questions prior to the visit, additional usage of staff PPE, and extensive cleaning of exam room while observing appropriate contact time as indicated for disinfecting solutions. ? ?Subjective:  ?  ? Patient ID: Jessica Banks , female    DOB: [birth year] , 41 y.o.   MRN: [id number] ? ? ?Chief Complaint  ?Patient presents with  ? Annual Exam  ? ? ?HPI ? ?She is here today for a full physical examination. She is followed by GYN for her pelvic exams. Patient also wants to discuss her headaches, and insomnia.  ?  ? ?Past Medical History:  ?Diagnosis Date  ? Abnormal Pap smear   ? CIN I  ? Anemia   ? Anxiety   ? Depression   ? off meds now, ok now  ? Enlarged thyroid gland   ? nromal function  ? GERD (gastroesophageal reflux disease)   ? Migraine   ? Obesity   ? Ovarian cyst   ? Vaginal Pap smear, abnormal   ? ok now  ?  ? ?Family History  ?Problem Relation Age of Onset  ? Thyroid disease Mother   ?     s/p thyroidectomy  ? Arthritis Mother   ? Leukemia Maternal Grandmother   ? Stroke Maternal Grandmother   ? Cancer Maternal Grandfather   ?     lung  ? Heart disease Paternal Grandmother   ? COPD Paternal Grandfather   ? Hypertension Maternal Uncle   ? Diabetes Maternal Uncle   ? ? ? ?Current Outpatient Medications:  ?  Biotin w/ Vitamins C & E (HAIR/SKIN/NAILS PO), Take by mouth. 1 per day, Disp: , Rfl:  ?  cholecalciferol (VITAMIN D3) 25 MCG (1000 UNIT) tablet, Take by mouth daily., Disp: , Rfl:  ?  cyclobenzaprine (FLEXERIL) 10 MG tablet, Take 10 mg by mouth as needed., Disp: , Rfl:  ?  dexlansoprazole (DEXILANT) 60 MG capsule, Take 60 mg by mouth daily., Disp: , Rfl:  ?   ibuprofen (ADVIL) 600 MG tablet, Take 600 mg by mouth every 6 (six) hours as needed for headache or moderate pain., Disp: , Rfl:  ?  magnesium oxide (MAG-OX) 400 MG tablet, Take 400 mg by mouth daily., Disp: , Rfl:  ?  Multiple Vitamin (MULTIVITAMIN WITH MINERALS) TABS tablet, Take 1 tablet by mouth daily., Disp: , Rfl:  ?  sertraline (ZOLOFT) 50 MG tablet, One tab daily., Disp: 90 tablet, Rfl: 2 ?  temazepam (RESTORIL) 15 MG capsule, Take 1 capsule (15 mg total) by mouth at bedtime as needed for sleep., Disp: 30 capsule, Rfl: 0 ?  butalbital-acetaminophen-caffeine (FIORICET) 50-325-40 MG tablet, Take 2 tablets by mouth every 6 (six) hours as needed., Disp: 20 tablet, Rfl: 0  ? ?Allergies  ?Allergen Reactions  ? Novocain [Procaine] Anaphylaxis  ? Povidone Iodine Rash  ?  Rash  ?  ? ? Negative for: breast discharge, breast lump(s), breast pain and breast self exam. Associated symptoms include abnormal vaginal bleeding. Pertinent negatives include abnormal bleeding (hematology), anxiety, decreased libido, depression, difficulty falling sleep, dyspareunia, history of infertility, nocturia, sexual dysfunction, sleep disturbances, urinary incontinence, urinary urgency, vaginal discharge and vaginal itching. Diet regular.The patient  states her exercise level is  intermittent. ? . The patient's tobacco use is:  ?Social History  ? ?Tobacco Use  ?Smoking Status Former  ? Years: 4.00  ? Types: Cigarettes  ? Quit date: 06/07/2013  ? Years since quitting: 8.7  ?Smokeless Tobacco Never  ?Tobacco Comments  ? only smoked on the weekends  ?Marland Kitchen She has been exposed to passive smoke. The patient's alcohol use is:  ?Social History  ? ?Substance and Sexual Activity  ?Alcohol Use Not Currently  ? Comment: SOCIAL   ? ? ?Review of Systems  ?Constitutional: Negative.   ?HENT: Negative.    ?Eyes: Negative.   ?Respiratory: Negative.    ?Cardiovascular: Negative.   ?Gastrointestinal: Negative.   ?Endocrine: Negative.   ?Genitourinary: Negative.    ?Musculoskeletal: Negative.   ?Skin: Negative.   ?Allergic/Immunologic: Negative.   ?Neurological:  Positive for headaches.  ?     She c/o worsening migraines. She has tried Botox in the past. She has been on multiple pharmaceuticals in the past which initially work, then become less effective over time.   ?Hematological: Negative.   ?Psychiatric/Behavioral:  Positive for sleep disturbance.   ?     She c/o worsening insomnia. She is not sure what is contributing to her symptoms. She states she comes home at night, showers and goes to bed. She has no issues falling asleep. She does report fragmented sleep.    ? ?Today's Vitals  ? 03/09/22 0856  ?BP: 122/78  ?Pulse: 80  ?Temp: 98.5 ?F (36.9 ?C)  ?Weight: 244 lb 9.6 oz (110.9 kg)  ?Height: 5' (1.524 m)  ?PainSc: 0-No pain  ? ?Body mass index is 47.77 kg/m?.  ?Wt Readings from Last 3 Encounters:  ?03/09/22 244 lb 9.6 oz (110.9 kg)  ?02/16/22 244 lb 12.8 oz (111 kg)  ?01/30/22 245 lb 6 oz (111.3 kg)  ?  ?Objective:  ?Physical Exam ?Vitals and nursing note reviewed.  ?Constitutional:   ?   Appearance: Normal appearance.  ?HENT:  ?   Head: Normocephalic and atraumatic.  ?   Right Ear: Tympanic membrane, ear canal and external ear normal.  ?   Left Ear: Tympanic membrane, ear canal and external ear normal.  ?   Nose:  ?   Comments: Masked  ?   Mouth/Throat:  ?   Comments: Masked  ?Eyes:  ?   Extraocular Movements: Extraocular movements intact.  ?   Conjunctiva/sclera: Conjunctivae normal.  ?   Pupils: Pupils are equal, round, and reactive to light.  ?Cardiovascular:  ?   Rate and Rhythm: Normal rate and regular rhythm.  ?   Pulses: Normal pulses.  ?   Heart sounds: Normal heart sounds.  ?Pulmonary:  ?   Effort: Pulmonary effort is normal.  ?   Breath sounds: Normal breath sounds.  ?Abdominal:  ?   General: Abdomen is flat. Bowel sounds are normal.  ?   Palpations: Abdomen is soft.  ?Genitourinary: ?   Comments: deferred ?Musculoskeletal:     ?   General: Normal range of  motion.  ?   Cervical back: Normal range of motion and neck supple.  ?Skin: ?   General: Skin is warm and dry.  ?Neurological:  ?   General: No focal deficit present.  ?   Mental Status: She is alert and oriented to person, place, and time.  ?Psychiatric:     ?   Mood and Affect: Mood normal.     ?   Behavior: Behavior normal.  ?   ?  Assessment And Plan:  ?   ?1. Encounter for general adult medical examination w/o abnormal findings ?Comments: A full exam was performed. Importance of monthly self breast exams was discussed with the patient. I will refer her for baseline mammogram.  Advised to schedule in 90 days since she is still breast-feeding at night. PATIENT IS ADVISED TO GET 30-45 MINUTES REGULAR EXERCISE NO LESS THAN FOUR TO FIVE DAYS PER WEEK - BOTH WEIGHTBEARING EXERCISES AND AEROBIC ARE RECOMMENDED.  PATIENT IS ADVISED TO FOLLOW A HEALTHY DIET WITH AT LEAST SIX FRUITS/VEGGIES PER DAY, DECREASE INTAKE OF RED MEAT, AND TO INCREASE FISH INTAKE TO TWO DAYS PER WEEK.  MEATS/FISH SHOULD NOT BE FRIED, BAKED OR BROILED IS PREFERABLE.  IT IS ALSO IMPORTANT TO CUT BACK ON YOUR SUGAR INTAKE. PLEASE AVOID ANYTHING WITH ADDED SUGAR, CORN SYRUP OR OTHER SWEETENERS. IF YOU MUST USE A SWEETENER, YOU CAN TRY STEVIA. IT IS ALSO IMPORTANT TO AVOID ARTIFICIALLY SWEETENERS AND DIET BEVERAGES. LASTLY, I SUGGEST WEARING SPF 50 SUNSCREEN ON EXPOSED PARTS AND ESPECIALLY WHEN IN THE DIRECT SUNLIGHT FOR AN EXTENDED PERIOD OF TIME.  PLEASE AVOID FAST FOOD RESTAURANTS AND INCREASE YOUR WATER INTAKE. ? ?- Lipid panel ?- Magnesium ? ?2. Migraine without aura and without status migrainosus, not intractable ?Comments: Chronic, she was given rx fioricet to use prn. She was given toradol injection as well, this has been effective in the past. Advised to continue with Mg supplementation, should consider glycinate variety. May need Neuro referral.  ?- ketorolac (TORADOL) injection 60 mg ? ?3. Primary insomnia ?Comments: She was given rx  temazepam to take ngihtly prn.  She is reminded to have nightly bedtime routine as well.  ? ?4. Class 3 severe obesity due to excess calories with serious comorbidity and body mass index (BMI) of 45.0 to 49

## 2022-03-15 ENCOUNTER — Encounter: Payer: Self-pay | Admitting: Internal Medicine

## 2022-03-15 ENCOUNTER — Other Ambulatory Visit: Payer: Self-pay | Admitting: Internal Medicine

## 2022-03-15 DIAGNOSIS — E041 Nontoxic single thyroid nodule: Secondary | ICD-10-CM

## 2022-03-21 ENCOUNTER — Other Ambulatory Visit: Payer: BC Managed Care – PPO

## 2022-03-21 DIAGNOSIS — E041 Nontoxic single thyroid nodule: Secondary | ICD-10-CM | POA: Diagnosis not present

## 2022-03-21 DIAGNOSIS — Z6841 Body Mass Index (BMI) 40.0 and over, adult: Secondary | ICD-10-CM | POA: Diagnosis not present

## 2022-03-22 ENCOUNTER — Encounter: Payer: Self-pay | Admitting: Internal Medicine

## 2022-03-22 LAB — TSH+FREE T4
Free T4: 1.05 ng/dL (ref 0.82–1.77)
TSH: 0.766 u[IU]/mL (ref 0.450–4.500)

## 2022-03-22 LAB — HEMOGLOBIN A1C
Est. average glucose Bld gHb Est-mCnc: 128 mg/dL
Hgb A1c MFr Bld: 6.1 % — ABNORMAL HIGH (ref 4.8–5.6)

## 2022-04-04 ENCOUNTER — Other Ambulatory Visit: Payer: Self-pay | Admitting: Internal Medicine

## 2022-04-05 ENCOUNTER — Encounter: Payer: Self-pay | Admitting: Internal Medicine

## 2022-04-05 ENCOUNTER — Other Ambulatory Visit: Payer: Self-pay | Admitting: Internal Medicine

## 2022-04-05 MED ORDER — TEMAZEPAM 15 MG PO CAPS: 15.0000 mg | ORAL_CAPSULE | Freq: Every evening | ORAL | 1 refills | Status: DC | PRN

## 2022-04-06 ENCOUNTER — Encounter: Payer: Self-pay | Admitting: Internal Medicine

## 2022-04-06 ENCOUNTER — Other Ambulatory Visit: Payer: Self-pay | Admitting: Internal Medicine

## 2022-04-06 MED ORDER — BUTALBITAL-APAP-CAFFEINE 50-325-40 MG PO TABS: 1.0000 | ORAL_TABLET | Freq: Four times a day (QID) | ORAL | 0 refills | Status: DC | PRN

## 2022-04-10 DIAGNOSIS — R1110 Vomiting, unspecified: Secondary | ICD-10-CM | POA: Diagnosis not present

## 2022-04-10 DIAGNOSIS — R142 Eructation: Secondary | ICD-10-CM | POA: Diagnosis not present

## 2022-04-10 DIAGNOSIS — R0789 Other chest pain: Secondary | ICD-10-CM | POA: Diagnosis not present

## 2022-04-10 DIAGNOSIS — R0989 Other specified symptoms and signs involving the circulatory and respiratory systems: Secondary | ICD-10-CM | POA: Diagnosis not present

## 2022-04-10 DIAGNOSIS — K219 Gastro-esophageal reflux disease without esophagitis: Secondary | ICD-10-CM | POA: Diagnosis not present

## 2022-04-11 DIAGNOSIS — K219 Gastro-esophageal reflux disease without esophagitis: Secondary | ICD-10-CM | POA: Diagnosis not present

## 2022-04-11 DIAGNOSIS — K2289 Other specified disease of esophagus: Secondary | ICD-10-CM | POA: Diagnosis not present

## 2022-04-11 DIAGNOSIS — F411 Generalized anxiety disorder: Secondary | ICD-10-CM | POA: Diagnosis not present

## 2022-04-20 DIAGNOSIS — K219 Gastro-esophageal reflux disease without esophagitis: Secondary | ICD-10-CM | POA: Diagnosis not present

## 2022-04-21 ENCOUNTER — Encounter: Payer: Self-pay | Admitting: Internal Medicine

## 2022-04-21 ENCOUNTER — Ambulatory Visit (INDEPENDENT_AMBULATORY_CARE_PROVIDER_SITE_OTHER): Payer: BC Managed Care – PPO | Admitting: Internal Medicine

## 2022-04-21 VITALS — BP 136/82 | HR 77 | Ht 60.0 in | Wt 246.0 lb

## 2022-04-21 DIAGNOSIS — E041 Nontoxic single thyroid nodule: Secondary | ICD-10-CM

## 2022-04-21 DIAGNOSIS — R748 Abnormal levels of other serum enzymes: Secondary | ICD-10-CM | POA: Diagnosis not present

## 2022-04-21 LAB — T4, FREE: Free T4: 0.78 ng/dL (ref 0.60–1.60)

## 2022-04-21 LAB — TSH: TSH: 0.6 u[IU]/mL (ref 0.35–5.50)

## 2022-04-21 NOTE — Progress Notes (Signed)
? ? ?Name: Jessica Banks  ?MRN/ DOB: [id number], 29-Mar-1981    ?Age/ Sex: 41 y.o., female   ? ?PCP: Glendale Chard, MD   ?Reason for Endocrinology Evaluation: Left thyroid nodule  ?   ?Date of Initial Endocrinology Evaluation: 04/21/2022   ? ? ?HPI: ?Jessica Banks is a 41 y.o. female with a past medical history of Migraine, right thyroid nodule . The patient presented for initial endocrinology clinic visit on 04/21/2022 for consultative assistance with her left thyroid nodule ? ?She was noted with thyromegaly on physical exam in March 2021, which prompted thyroid ultrasound showing 1.1 cm left superior thyroid nodule that did not meet criteria for FNA.  Repeat ultrasound in April 2022 showed stability. ? ? ? ? ?She has noted  increase in size neck , she is now also concerned about appearance and having to adjust her neck during pictures  ?Denies dysphagia  ? 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  ? ?Denies constipation or diarrhea  ?Has occasional palpitations started around the pandemic, had a panic attack at the time with normal cardiology w/u ?Denies hand tremors  ?She takes Biotin  ?She also is on Vitamin D 1000 iu daily  ?MVI  ? ? ?Mother with hyperthyroidism  ? ? ?HISTORY:  ?Past Medical History:  ?Past Medical History:  ?Diagnosis Date  ? Abnormal Pap smear   ? CIN I  ? Anemia   ? Anxiety   ? Depression   ? off meds now, ok now  ? Enlarged thyroid gland   ? nromal function  ? GERD (gastroesophageal reflux disease)   ? Migraine   ? Obesity   ? Ovarian cyst   ? Vaginal Pap smear, abnormal   ? ok now  ? ?Past Surgical History:  ?Past Surgical History:  ?Procedure Laterality Date  ? LAPAROSCOPIC GASTRIC SLEEVE RESECTION    ?  ?Social History:  reports that she quit smoking about 8 years ago. Her smoking use included cigarettes. She has never used smokeless tobacco. She reports that she does not currently use alcohol. She  reports that she does not use drugs. ?Family History: family history includes Arthritis in her mother; COPD in her paternal grandfather; Cancer in her maternal grandfather; Diabetes in her maternal uncle; Heart disease in her paternal grandmother; Hypertension in her maternal uncle; Leukemia in her maternal grandmother; Stroke in her maternal grandmother; Thyroid disease in her mother. ? ? ?HOME MEDICATIONS: ?Allergies as of 04/21/2022   ? ?   Reactions  ? Novocain [procaine] Anaphylaxis  ? Povidone Iodine Rash  ? Rash  ? ?  ? ?  ?Medication List  ?  ? ?  ? Accurate as of Apr 21, 2022 11:56 AM. If you have any questions, ask your nurse or doctor.  ?  ?  ? ?  ? ?butalbital-acetaminophen-caffeine 50-325-40 MG tablet ?Commonly known as: FIORICET ?Take 1 tablet by mouth every 6 (six) hours as needed. ?  ?cholecalciferol 25 MCG (1000 UNIT) tablet ?Commonly known as: VITAMIN D3 ?Take by mouth daily. ?  ?cyclobenzaprine 10 MG tablet ?Commonly known as: FLEXERIL ?Take 10 mg by mouth as needed. ?  ?dexlansoprazole 60 MG capsule ?Commonly known as: DEXILANT ?Take 60 mg by mouth daily. ?  ?famotidine 10 MG tablet ?Commonly known as: PEPCID ?  ?HAIR/SKIN/NAILS PO ?Take by mouth. 1 per day ?  ?ibuprofen 600 MG tablet ?Commonly known as:  ADVIL ?Take 600 mg by mouth every 6 (six) hours as needed for headache or moderate pain. ?  ?magnesium oxide 400 MG tablet ?Commonly known as: MAG-OX ?Take 400 mg by mouth daily. ?  ?multivitamin with minerals Tabs tablet ?Take 1 tablet by mouth daily. ?  ?sertraline 50 MG tablet ?Commonly known as: Zoloft ?One tab daily. ?  ?temazepam 15 MG capsule ?Commonly known as: RESTORIL ?Take 1 capsule (15 mg total) by mouth at bedtime as needed for sleep. ?  ? ?  ?  ? ? ?REVIEW OF SYSTEMS: ?A comprehensive ROS was conducted with the patient and is negative except as per HPI  ? ? ?OBJECTIVE:  ?VS: BP 136/82 (BP Location: Left Arm, Patient Position: Sitting, Cuff Size: Normal)   Pulse 77   Ht 5' (1.524 m)    Wt 246 lb (111.6 kg)   SpO2 99%   BMI 48.04 kg/m?   ? ?Wt Readings from Last 3 Encounters:  ?04/21/22 246 lb (111.6 kg)  ?03/09/22 244 lb 9.6 oz (110.9 kg)  ?02/16/22 244 lb 12.8 oz (111 kg)  ? ? ? ?EXAM: ?General: Pt appears well and is in NAD  ?Neck: General: Supple without adenopathy. ?Thyroid: Thyroid size normal.  No goiter or nodules appreciated.   ?Lungs: Clear with good BS bilat with no rales, rhonchi, or wheezes  ?Heart: Auscultation: RRR.  ?Abdomen: Normoactive bowel sounds, soft, nontender, without masses or organomegaly palpable  ?Extremities:  ?BL LE: No pretibial edema normal ROM and strength.  ?Mental Status: Judgment, insight: Intact ?Orientation: Oriented to time, place, and person ?Mood and affect: No depression, anxiety, or agitation  ? ? ? ?DATA REVIEWED: ? Latest Reference Range & Units 04/21/22 12:11  ?TSH 0.35 - 5.50 uIU/mL 0.60  ?T4,Free(Direct) 0.60 - 1.60 ng/dL 0.78  ? ? ?Thyroid ultrasound 04/07/2021 ? Estimated total number of nodules >/= 1 cm: 1 ?  ?Number of spongiform nodules >/=  2 cm not described below (TR1): 0 ?  ?Number of mixed cystic and solid nodules >/= 1.5 cm not described ?below ([place]): 0 ?  ?_________________________________________________________ ?  ?Nodule # 1: ?  ?Prior biopsy: No ?  ?Location: Left; Superior ?  ?Maximum size: 1.0 cm; Other 2 dimensions: 0.8 x 0.7 cm, previously, ?1.1 x 0.7 x 0.7 cm ?  ?Composition: solid/almost completely solid (2) ?  ?Echogenicity: isoechoic (1) ?  ?Shape: not taller-than-wide (0) ?  ?Margins: ill-defined (0) ?  ?Echogenic foci: punctate echogenic foci (3) ?  ?ACR TI-RADS total points: 6. ?  ?ACR TI-RADS risk category:  TR4 (4-6 points). ?  ?Significant change in size (>/= 20% in two dimensions and minimal ?increase of 2 mm): No ?  ?Change in features: No ?  ?Change in ACR TI-RADS risk category: No ?  ?ACR TI-RADS recommendations: ?  ?*Given size (>/= 1 - 1.4 cm) and appearance, a follow-up ultrasound ?in 1 year should be considered  based on TI-RADS criteria. ?  ?_________________________________________________________ ?  ?IMPRESSION: ?Unchanged appearance of previously visualized left superior thyroid ?nodule (labeled 1, 1.0 cm) which again meets criteria (TI-RADS ?category 4) for 1 year ultrasound surveillance. This study marks 1 ?year stability. ?  ? ?ASSESSMENT/PLAN/RECOMMENDATIONS:  ? ?Left thyroid nodule: ? ? ?-Reassurance provided at this time ?-There is no indication for FNA based on last year's ultrasound ?-Patient is clinically and biochemically euthyroid ?-I have recommended proceeding with a repeat ultrasound this yea ? ? ?2. Elevated Alkaline Phosphatase: ? ? ?-  Elevated Alkaline phosphatase could be of  hepatic, intestinal or bone origin. ?- Elevated Alkaline phosphatase of bone origin is due to increase osteoblastic activity.  ?- Causes include : hyperthyroidism, hyperparathyroidism, osteomalacia , recent fractures, paget's disease or familial causes.  ?-We will proceed with alkaline phosphatase isoenzymes ?-I explained to the patient that if this elevation is due to hepatic origin, the most likely reason is fatty liver and weight loss is the first-line of treatment for fatty liver ? ? ? ?Signed electronically by: ?Abby Nena Jordan, MD ? ?[facility]  ?[facility] ?[address] ?[place], [place] [address] ?Phone: [phone] ?FAX: [phone] ? ? ?CC: ?Glendale Chard, MD ?[address] ?[place] [address] ?Phone: [phone] ?Fax: [phone] ? ? ?Return to Endocrinology clinic as below: ?Future Appointments  ?Date Time Provider [place]  ?06/21/2022  8:20 AM [facility] None  ?03/28/2023  9:00 AM Glendale Chard, MD [facility] None  ?  ? ? ? ? ? ?

## 2022-04-26 LAB — ALKALINE PHOSPHATASE, ISOENZYMES
Alkaline Phosphatase: 187 IU/L — ABNORMAL HIGH (ref 44–121)
BONE FRACTION: 24 % (ref 14–68)
INTESTINAL FRAC.: 3 % (ref 0–18)
LIVER FRACTION: 73 % (ref 18–85)

## 2022-05-11 ENCOUNTER — Ambulatory Visit
Admission: RE | Admit: 2022-05-11 | Discharge: 2022-05-11 | Disposition: A | Discharge status: Home / Self Care | Payer: BC Managed Care – PPO | Source: Ambulatory Visit | Attending: Internal Medicine | Admitting: Internal Medicine

## 2022-05-11 DIAGNOSIS — E041 Nontoxic single thyroid nodule: Secondary | ICD-10-CM

## 2022-05-11 DIAGNOSIS — E010 Iodine-deficiency related diffuse (endemic) goiter: Secondary | ICD-10-CM | POA: Diagnosis not present

## 2022-05-18 ENCOUNTER — Other Ambulatory Visit: Payer: Self-pay | Admitting: Internal Medicine

## 2022-05-31 DIAGNOSIS — R300 Dysuria: Secondary | ICD-10-CM | POA: Diagnosis not present

## 2022-06-13 ENCOUNTER — Other Ambulatory Visit: Payer: Self-pay | Admitting: Internal Medicine

## 2022-06-21 ENCOUNTER — Other Ambulatory Visit: Payer: BC Managed Care – PPO

## 2022-06-21 ENCOUNTER — Encounter: Payer: Self-pay | Admitting: Internal Medicine

## 2022-06-21 DIAGNOSIS — R7309 Other abnormal glucose: Secondary | ICD-10-CM

## 2022-06-21 LAB — CMP14+EGFR
ALT: 11 IU/L (ref 0–32)
AST: 19 IU/L (ref 0–40)
Albumin/Globulin Ratio: 1.3 (ref 1.2–2.2)
Albumin: 4 g/dL (ref 3.8–4.8)
Alkaline Phosphatase: 215 IU/L — ABNORMAL HIGH (ref 44–121)
BUN/Creatinine Ratio: 11 (ref 9–23)
BUN: 7 mg/dL (ref 6–24)
Bilirubin Total: 0.2 mg/dL (ref 0.0–1.2)
CO2: 24 mmol/L (ref 20–29)
Calcium: 9.2 mg/dL (ref 8.7–10.2)
Chloride: 100 mmol/L (ref 96–106)
Creatinine, Ser: 0.66 mg/dL (ref 0.57–1.00)
Globulin, Total: 3.2 g/dL (ref 1.5–4.5)
Glucose: 90 mg/dL (ref 70–99)
Potassium: 4.5 mmol/L (ref 3.5–5.2)
Sodium: 140 mmol/L (ref 134–144)
Total Protein: 7.2 g/dL (ref 6.0–8.5)
eGFR: 114 mL/min/{1.73_m2} (ref >59)

## 2022-06-21 LAB — HEMOGLOBIN A1C
Est. average glucose Bld gHb Est-mCnc: 123 mg/dL
Hgb A1c MFr Bld: 5.9 % — ABNORMAL HIGH (ref 4.8–5.6)

## 2022-06-22 ENCOUNTER — Encounter: Payer: Self-pay | Admitting: Internal Medicine

## 2022-06-22 DIAGNOSIS — Z9884 Bariatric surgery status: Secondary | ICD-10-CM | POA: Diagnosis not present

## 2022-06-22 DIAGNOSIS — Z01818 Encounter for other preprocedural examination: Secondary | ICD-10-CM | POA: Diagnosis not present

## 2022-06-27 DIAGNOSIS — F411 Generalized anxiety disorder: Secondary | ICD-10-CM | POA: Diagnosis not present

## 2022-06-28 ENCOUNTER — Ambulatory Visit (HOSPITAL_COMMUNITY)
Admission: RE | Admit: 2022-06-28 | Payer: BC Managed Care – PPO | Source: Home / Self Care | Admitting: Gastroenterology

## 2022-06-28 ENCOUNTER — Encounter (HOSPITAL_COMMUNITY): Admission: RE | Payer: Self-pay | Source: Home / Self Care

## 2022-06-28 SURGERY — MANOMETRY, ESOPHAGUS
Anesthesia: Choice

## 2022-07-13 DIAGNOSIS — F411 Generalized anxiety disorder: Secondary | ICD-10-CM | POA: Diagnosis not present

## 2022-07-14 ENCOUNTER — Other Ambulatory Visit: Payer: Self-pay | Admitting: Internal Medicine

## 2022-07-19 ENCOUNTER — Other Ambulatory Visit: Payer: Self-pay

## 2022-07-19 ENCOUNTER — Encounter: Payer: Self-pay | Admitting: Internal Medicine

## 2022-07-19 MED ORDER — DEXLANSOPRAZOLE 60 MG PO CPDR: 60.0000 mg | DELAYED_RELEASE_CAPSULE | Freq: Every day | ORAL | 1 refills | Status: DC

## 2022-08-01 DIAGNOSIS — Z01818 Encounter for other preprocedural examination: Secondary | ICD-10-CM | POA: Diagnosis not present

## 2022-08-03 DIAGNOSIS — Z9884 Bariatric surgery status: Secondary | ICD-10-CM | POA: Diagnosis not present

## 2022-08-03 DIAGNOSIS — Z6841 Body Mass Index (BMI) 40.0 and over, adult: Secondary | ICD-10-CM | POA: Diagnosis not present

## 2022-08-03 DIAGNOSIS — Z01818 Encounter for other preprocedural examination: Secondary | ICD-10-CM | POA: Diagnosis not present

## 2022-08-14 DIAGNOSIS — Z6841 Body Mass Index (BMI) 40.0 and over, adult: Secondary | ICD-10-CM | POA: Diagnosis not present

## 2022-08-14 DIAGNOSIS — G43909 Migraine, unspecified, not intractable, without status migrainosus: Secondary | ICD-10-CM | POA: Diagnosis not present

## 2022-08-14 DIAGNOSIS — Z87891 Personal history of nicotine dependence: Secondary | ICD-10-CM | POA: Diagnosis not present

## 2022-08-14 DIAGNOSIS — K449 Diaphragmatic hernia without obstruction or gangrene: Secondary | ICD-10-CM | POA: Diagnosis not present

## 2022-08-14 DIAGNOSIS — Z883 Allergy status to other anti-infective agents status: Secondary | ICD-10-CM | POA: Diagnosis not present

## 2022-08-14 DIAGNOSIS — F419 Anxiety disorder, unspecified: Secondary | ICD-10-CM | POA: Diagnosis not present

## 2022-08-14 DIAGNOSIS — K660 Peritoneal adhesions (postprocedural) (postinfection): Secondary | ICD-10-CM | POA: Diagnosis not present

## 2022-08-14 DIAGNOSIS — Z886 Allergy status to analgesic agent status: Secondary | ICD-10-CM | POA: Diagnosis not present

## 2022-08-14 DIAGNOSIS — F32A Depression, unspecified: Secondary | ICD-10-CM | POA: Diagnosis not present

## 2022-08-14 DIAGNOSIS — K2100 Gastro-esophageal reflux disease with esophagitis, without bleeding: Secondary | ICD-10-CM | POA: Diagnosis not present

## 2022-08-14 DIAGNOSIS — Z9884 Bariatric surgery status: Secondary | ICD-10-CM | POA: Diagnosis not present

## 2022-08-14 DIAGNOSIS — Z87892 Personal history of anaphylaxis: Secondary | ICD-10-CM | POA: Diagnosis not present

## 2022-08-14 DIAGNOSIS — Z8616 Personal history of COVID-19: Secondary | ICD-10-CM | POA: Diagnosis not present

## 2022-08-14 DIAGNOSIS — Z79899 Other long term (current) drug therapy: Secondary | ICD-10-CM | POA: Diagnosis not present

## 2022-08-14 DIAGNOSIS — K219 Gastro-esophageal reflux disease without esophagitis: Secondary | ICD-10-CM | POA: Diagnosis not present

## 2022-08-14 HISTORY — PX: GASTRIC BYPASS: SHX52

## 2022-08-16 ENCOUNTER — Other Ambulatory Visit: Payer: Self-pay | Admitting: Internal Medicine

## 2022-08-17 ENCOUNTER — Other Ambulatory Visit: Payer: Self-pay | Admitting: Internal Medicine

## 2022-08-18 ENCOUNTER — Encounter: Payer: Self-pay | Admitting: Internal Medicine

## 2022-08-31 DIAGNOSIS — Z713 Dietary counseling and surveillance: Secondary | ICD-10-CM | POA: Diagnosis not present

## 2022-08-31 DIAGNOSIS — Z9884 Bariatric surgery status: Secondary | ICD-10-CM | POA: Diagnosis not present

## 2022-10-21 ENCOUNTER — Other Ambulatory Visit: Payer: Self-pay | Admitting: Internal Medicine

## 2022-10-24 DIAGNOSIS — R5381 Other malaise: Secondary | ICD-10-CM | POA: Diagnosis not present

## 2022-10-25 LAB — BASIC METABOLIC PANEL
BUN: 10 (ref 4–21)
CO2: 21 (ref 13–22)
Chloride: 103 (ref 99–108)
Creatinine: 0.6 (ref 0.5–1.1)
Glucose: 105
Potassium: 3.8 mEq/L (ref 3.5–5.1)
Sodium: 137 (ref 137–147)

## 2022-10-25 LAB — IRON,TIBC AND FERRITIN PANEL
%SAT: 11
Ferritin: 15
Iron: 33
UIBC: 266

## 2022-10-25 LAB — COMPREHENSIVE METABOLIC PANEL
Albumin: 4.1 (ref 3.5–5.0)
Calcium: 9.4 (ref 8.7–10.7)
eGFR: 119

## 2022-10-25 LAB — CBC AND DIFFERENTIAL
HCT: 33 — AB (ref 36–46)
Hemoglobin: 10.2 — AB (ref 12.0–16.0)
Neutrophils Absolute: 61
Platelets: 428 10*3/uL — AB (ref 150–400)
WBC: 6.5

## 2022-10-25 LAB — HEPATIC FUNCTION PANEL
ALT: 9 U/L (ref 7–35)
AST: 19 (ref 13–35)
Alkaline Phosphatase: 167 — AB (ref 25–125)

## 2022-10-25 LAB — CBC: RBC: 4.13 (ref 3.87–5.11)

## 2022-10-25 LAB — TSH: TSH: 0.53 (ref 0.41–5.90)

## 2022-10-25 LAB — VITAMIN D 25 HYDROXY (VIT D DEFICIENCY, FRACTURES): Vit D, 25-Hydroxy: 31

## 2022-10-30 ENCOUNTER — Encounter: Payer: Self-pay | Admitting: Internal Medicine

## 2022-10-30 ENCOUNTER — Telehealth (INDEPENDENT_AMBULATORY_CARE_PROVIDER_SITE_OTHER): Payer: BC Managed Care – PPO | Admitting: Internal Medicine

## 2022-10-30 VITALS — BP 122/70 | HR 66 | Temp 97.1°F | Ht 60.0 in | Wt 246.0 lb

## 2022-10-30 DIAGNOSIS — F419 Anxiety disorder, unspecified: Secondary | ICD-10-CM | POA: Diagnosis not present

## 2022-10-30 DIAGNOSIS — D509 Iron deficiency anemia, unspecified: Secondary | ICD-10-CM | POA: Diagnosis not present

## 2022-10-30 DIAGNOSIS — R748 Abnormal levels of other serum enzymes: Secondary | ICD-10-CM

## 2022-10-30 DIAGNOSIS — R5383 Other fatigue: Secondary | ICD-10-CM | POA: Diagnosis not present

## 2022-10-30 MED ORDER — FUSION PLUS PO CAPS
ORAL_CAPSULE | ORAL | 1 refills | Status: DC
Start: 2022-10-30 — End: 2023-11-19

## 2022-10-30 NOTE — Progress Notes (Signed)
Virtual Visit via Video   This visit type was conducted due to national recommendations for restrictions regarding the COVID-19 Pandemic (e.g. social distancing) in an effort to limit this patient's exposure and mitigate transmission in our community.  Due to her co-morbid illnesses, this patient is at least at moderate risk for complications without adequate follow up.  This format is felt to be most appropriate for this patient at this time.  All issues noted in this document were discussed and addressed.  A limited physical exam was performed with this format.    This visit type was conducted due to national recommendations for restrictions regarding the COVID-19 Pandemic (e.g. social distancing) in an effort to limit this patient's exposure and mitigate transmission in our community.  Patients identity confirmed using two different identifiers.  This format is felt to be most appropriate for this patient at this time.  All issues noted in this document were discussed and addressed.  No physical exam was performed (except for noted visual exam findings with Video Visits).    Barnet Glasgow Martin,acting as a Education administrator for [name], MD.,have documented all relevant documentation on the behalf of [name], MD,as directed by  [name], MD while in the presence of [name], MD.  Date:  11/04/2022   ID:  CANNA NICKELSON, DOB 24-May-1981, MRN [id number]  Patient Location:  Work, private office  Provider location:   Office    Chief Complaint:  "I have fatigue"  History of Present Illness:    BEATRIC FULOP is a 41 y.o. female who presents via video conferencing for a telehealth visit today.    The patient does not have symptoms concerning for COVID-19 infection (fever, chills, cough, or new shortness of breath).   xShe presents today for virtual visit. She prefers this method of contact due to COVID-19 pandemic.  She presents today for further evaluation of  fatigue. She reports her sx started several months ago. She denies a change in her appetite and sleep habits. She had labs drawn at work and states her iron levels are low. She denies having melena and BRBPR.  She would like to start iron supplements.       Past Medical History:  Diagnosis Date   Abnormal Pap smear    CIN I   Anemia    Anxiety    Depression    off meds now, ok now   Enlarged thyroid gland    nromal function   GERD (gastroesophageal reflux disease)    Migraine    Obesity    Ovarian cyst    Vaginal Pap smear, abnormal    ok now   Past Surgical History:  Procedure Laterality Date   LAPAROSCOPIC GASTRIC SLEEVE RESECTION       Current Meds  Medication Sig   Biotin w/ Vitamins C & E (HAIR/SKIN/NAILS PO) Take by mouth. 1 per day   butalbital-acetaminophen-caffeine (FIORICET) 50-325-40 MG tablet Take 1 tablet by mouth every 6 (six) hours as needed.   cholecalciferol (VITAMIN D3) 25 MCG (1000 UNIT) tablet Take by mouth daily.   cyclobenzaprine (FLEXERIL) 10 MG tablet Take 10 mg by mouth as needed.   dexlansoprazole (DEXILANT) 60 MG capsule TAKE 1 CAPSULE BY MOUTH EVERY DAY   dexlansoprazole (DEXILANT) 60 MG capsule Take 1 capsule (60 mg total) by mouth daily.   Iron-FA-B Cmp-C-Biot-Probiotic (FUSION PLUS) CAPS One capsule po qd   sertraline (ZOLOFT) 50 MG tablet TAKE 1 TABLET BY  MOUTH EVERY DAY   temazepam (RESTORIL) 15 MG capsule TAKE 1 CAPSULE BY MOUTH AT BEDTIME AS NEEDED FOR SLEEP. **DNF 04/08/22**     Allergies:   Novocain [procaine] and Povidone iodine   Social History   Tobacco Use   Smoking status: Former    Years: 4.00    Types: Cigarettes    Quit date: 06/07/2013    Years since quitting: 9.4   Smokeless tobacco: Never   Tobacco comments:    only smoked on the weekends  Vaping Use   Vaping Use: Never used  Substance Use Topics   Alcohol use: Not Currently    Comment: SOCIAL    Drug use: No     Family Hx: The patient's family history  includes Arthritis in her mother; COPD in her paternal grandfather; Cancer in her maternal grandfather; Diabetes in her maternal uncle; Heart disease in her paternal grandmother; Hypertension in her maternal uncle; Leukemia in her maternal grandmother; Stroke in her maternal grandmother; Thyroid disease in her mother.  ROS:   Please see the history of present illness.    Review of Systems  Constitutional:  Positive for malaise/fatigue.  Respiratory: Negative.    Cardiovascular: Negative.   Gastrointestinal: Negative.   Neurological: Negative.   Psychiatric/Behavioral: Negative.      All other systems reviewed and are negative.   Labs/Other Tests and Data Reviewed:    Recent Labs: 03/09/2022: Magnesium 2.1 10/25/2022: ALT 9; BUN 10; Creatinine 0.6; Hemoglobin 10.2; Platelets 428; Potassium 3.8; Sodium 137; TSH 0.53   Recent Lipid Panel Lab Results  Component Value Date/Time   CHOL 200 (H) 03/09/2022 09:52 AM   TRIG 48 03/09/2022 09:52 AM   HDL 74 03/09/2022 09:52 AM   CHOLHDL 2.7 03/09/2022 09:52 AM   LDLCALC 117 (H) 03/09/2022 09:52 AM    Wt Readings from Last 3 Encounters:  10/30/22 246 lb (111.6 kg)  04/21/22 246 lb (111.6 kg)  03/09/22 244 lb 9.6 oz (110.9 kg)     Exam:    Vital Signs:  BP 122/70   Pulse 66   Temp (!) 97.1 F (36.2 C) (Axillary)   Ht 5' (1.524 m)   Wt 246 lb (111.6 kg)   LMP 10/24/2022   SpO2 99%   Breastfeeding No   BMI 48.04 kg/m     Physical Exam Vitals and nursing note reviewed.  Constitutional:      Appearance: Normal appearance.  HENT:     Head: Normocephalic and atraumatic.  Eyes:     Extraocular Movements: Extraocular movements intact.  Pulmonary:     Effort: Pulmonary effort is normal.  Musculoskeletal:     Cervical back: Normal range of motion.  Neurological:     Mental Status: She is alert and oriented to person, place, and time.  Psychiatric:        Mood and Affect: Affect normal.    ASSESSMENT & PLAN:    1. Iron  deficiency anemia, unspecified iron deficiency anemia type Comments: I will review labs once available. I will send rx Fusion Plus to her local pharmacy.  2. Other fatigue Comments: Possibly related to iron deficiency anemia. May need to perform  3. Elevated alkaline phosphatase level Comments: I will review her labs when received, will also need to check vitamin D level along with phosphorus levels. May also need to check isoenzymes.  4. Anxiety   COVID-19 Education: The signs and symptoms of COVID-19 were discussed with the patient and how to seek care for testing (  follow up with PCP or arrange E-visit).  The importance of social distancing was discussed today.  Patient Risk:   After full review of this patients clinical status, I feel that they are at least moderate risk at this time.  Time:   Today, I have spent 25 minutes/ seconds with the patient with telehealth technology discussing above diagnoses.     Medication Adjustments/Labs and Tests Ordered: Current medicines are reviewed at length with the patient today.  Concerns regarding medicines are outlined above.   Tests Ordered: No orders of the defined types were placed in this encounter.   Medication Changes: Meds ordered this encounter  Medications   Iron-FA-B Cmp-C-Biot-Probiotic (FUSION PLUS) CAPS    Sig: One capsule po qd    Dispense:  30 capsule    Refill:  1    Disposition:  Follow up in 3 month(s)  Signed, [name], MD

## 2022-10-30 NOTE — Patient Instructions (Signed)
Fatigue If you have fatigue, you feel tired all the time and have a lack of energy or a lack of motivation. Fatigue may make it difficult to start or complete tasks because of exhaustion. Occasional or mild fatigue is often a normal response to activity or life. However, long-term (chronic) or extreme fatigue may be a symptom of a medical condition such as: Depression. Not having enough red blood cells or hemoglobin in the blood (anemia). A problem with a small gland located in the lower front part of the neck (thyroid disorder). Rheumatologic conditions. These are problems related to the body's defense system (immune system). Infections, especially certain viral infections. Fatigue can also lead to negative health outcomes over time. Follow these instructions at home: Medicines Take over-the-counter and prescription medicines only as told by your health care provider. Take a multivitamin if told by your health care provider. Do not use herbal or dietary supplements unless they are approved by your health care provider. Eating and drinking  Avoid heavy meals in the evening. Eat a well-balanced diet, which includes lean proteins, whole grains, plenty of fruits and vegetables, and low-fat dairy products. Avoid eating or drinking too many products with caffeine in them. Avoid alcohol. Drink enough fluid to keep your urine pale yellow. Activity  Exercise regularly, as told by your health care provider. Use or practice techniques to help you relax, such as yoga, tai chi, meditation, or massage therapy. Lifestyle Change situations that cause you stress. Try to keep your work and personal schedules in balance. Do not use recreational or illegal drugs. General instructions Monitor your fatigue for any changes. Go to bed and get up at the same time every day. Avoid fatigue by pacing yourself during the day and getting enough sleep at night. Maintain a healthy weight. Contact a health care  provider if: Your fatigue does not get better. You have a fever. You suddenly lose or gain weight. You have headaches. You have trouble falling asleep or sleeping through the night. You feel angry, guilty, anxious, or sad. You have swelling in your legs or another part of your body. Get help right away if: You feel confused, feel like you might faint, or faint. Your vision is blurry or you have a severe headache. You have severe pain in your abdomen, your back, or the area between your waist and hips (pelvis). You have chest pain, shortness of breath, or an irregular or fast heartbeat. You are unable to urinate, or you urinate less than normal. You have abnormal bleeding from the rectum, nose, lungs, nipples, or, if you are female, the vagina. You vomit blood. You have thoughts about hurting yourself or others. These symptoms may be an emergency. Get help right away. Call 911. Do not wait to see if the symptoms will go away. Do not drive yourself to the hospital. Get help right away if you feel like you may hurt yourself or others, or have thoughts about taking your own life. Go to your nearest emergency room or: Call 911. Call the [facility] at [phone] or 988. This is open 24 hours a day. Text the Crisis Text Line at 979-823-0385. Summary If you have fatigue, you feel tired all the time and have a lack of energy or a lack of motivation. Fatigue may make it difficult to start or complete tasks because of exhaustion. Long-term (chronic) or extreme fatigue may be a symptom of a medical condition. Exercise regularly, as told by your health care provider.  Change situations that cause you stress. Try to keep your work and personal schedules in balance. This information is not intended to replace advice given to you by your health care provider. Make sure you discuss any questions you have with your health care provider. Document Revised: 09/26/2021 Document  Reviewed: 09/26/2021 Elsevier Patient Education  2023 Elsevier Inc.  

## 2022-10-31 DIAGNOSIS — R5383 Other fatigue: Secondary | ICD-10-CM | POA: Diagnosis not present

## 2022-11-01 ENCOUNTER — Encounter: Payer: Self-pay | Admitting: Internal Medicine

## 2022-11-06 DIAGNOSIS — F411 Generalized anxiety disorder: Secondary | ICD-10-CM | POA: Diagnosis not present

## 2022-12-25 ENCOUNTER — Other Ambulatory Visit: Payer: Self-pay | Admitting: Internal Medicine

## 2023-02-01 ENCOUNTER — Other Ambulatory Visit: Payer: Self-pay | Admitting: Internal Medicine

## 2023-02-01 DIAGNOSIS — Z1231 Encounter for screening mammogram for malignant neoplasm of breast: Secondary | ICD-10-CM

## 2023-02-05 ENCOUNTER — Encounter: Payer: Self-pay | Admitting: Internal Medicine

## 2023-02-06 DIAGNOSIS — Z713 Dietary counseling and surveillance: Secondary | ICD-10-CM | POA: Diagnosis not present

## 2023-02-06 DIAGNOSIS — Z9884 Bariatric surgery status: Secondary | ICD-10-CM | POA: Diagnosis not present

## 2023-02-06 DIAGNOSIS — Z6837 Body mass index (BMI) 37.0-37.9, adult: Secondary | ICD-10-CM | POA: Diagnosis not present

## 2023-02-06 LAB — IRON,TIBC AND FERRITIN PANEL
Ferritin: 5
Iron: 20

## 2023-02-06 LAB — COMPREHENSIVE METABOLIC PANEL
Albumin: 4.3 (ref 3.5–5.0)
Calcium: 9.5 (ref 8.7–10.7)
Globulin: 1.4
eGFR: >60

## 2023-02-06 LAB — HEPATIC FUNCTION PANEL
ALT: 7 U/L (ref 7–35)
AST: 15 (ref 13–35)
Alkaline Phosphatase: 197 — AB (ref 25–125)

## 2023-02-06 LAB — CBC: RBC: 3.93 (ref 3.87–5.11)

## 2023-02-06 LAB — VITAMIN B12: Vitamin B-12: 331

## 2023-02-06 LAB — BASIC METABOLIC PANEL
BUN: 12 (ref 4–21)
CO2: 25 — AB (ref 13–22)
Chloride: 105 (ref 99–108)
Creatinine: 0.6 (ref 0.5–1.1)
Glucose: 96
Potassium: 4.2 mEq/L (ref 3.5–5.1)
Sodium: 138 (ref 137–147)

## 2023-02-06 LAB — CBC AND DIFFERENTIAL
HCT: 31 — AB (ref 36–46)
Hemoglobin: 10 — AB (ref 12.0–16.0)
Platelets: 381 10*3/uL (ref 150–400)
WBC: 6.4

## 2023-02-06 LAB — VITAMIN D 25 HYDROXY (VIT D DEFICIENCY, FRACTURES): Vit D, 25-Hydroxy: 19

## 2023-02-06 LAB — HEMOGLOBIN A1C: Hemoglobin A1C: 5.7

## 2023-02-08 ENCOUNTER — Telehealth (INDEPENDENT_AMBULATORY_CARE_PROVIDER_SITE_OTHER): Payer: BC Managed Care – PPO | Admitting: Internal Medicine

## 2023-02-08 ENCOUNTER — Encounter: Payer: Self-pay | Admitting: Internal Medicine

## 2023-02-08 VITALS — Wt 196.0 lb

## 2023-02-08 DIAGNOSIS — D508 Other iron deficiency anemias: Secondary | ICD-10-CM

## 2023-02-08 DIAGNOSIS — G43019 Migraine without aura, intractable, without status migrainosus: Secondary | ICD-10-CM

## 2023-02-08 DIAGNOSIS — E6609 Other obesity due to excess calories: Secondary | ICD-10-CM | POA: Diagnosis not present

## 2023-02-08 DIAGNOSIS — Z6838 Body mass index (BMI) 38.0-38.9, adult: Secondary | ICD-10-CM

## 2023-02-08 NOTE — Progress Notes (Signed)
Virtual Visit via Video   This visit type was conducted due to national recommendations for restrictions regarding the COVID-19 Pandemic (e.g. social distancing) in an effort to limit this patient's exposure and mitigate transmission in our community.  Due to her co-morbid illnesses, this patient is at least at moderate risk for complications without adequate follow up.  This format is felt to be most appropriate for this patient at this time.  All issues noted in this document were discussed and addressed.  A limited physical exam was performed with this format.    This visit type was conducted due to national recommendations for restrictions regarding the COVID-19 Pandemic (e.g. social distancing) in an effort to limit this patient's exposure and mitigate transmission in our community.  Patients identity confirmed using two different identifiers.  This format is felt to be most appropriate for this patient at this time.  All issues noted in this document were discussed and addressed.  No physical exam was performed (except for noted visual exam findings with Video Visits).    Date:  02/08/2023   ID:  Jessica Banks, DOB 02-12-1981, MRN [id number]  Patient Location:  Work, private office  Provider location:   [employer] Complaint:  "I need referral for migraines"  History of Present Illness:    Jessica Banks is a 42 y.o. female who presents via video conferencing for a telehealth visit today.    The patient does not have symptoms concerning for COVID-19 infection (fever, chills, cough, or new shortness of breath).   She presents today for virtual visit. She prefers this method of contact due to COVID-19 pandemic.  She presents today for virtual visit due to recurrent migraines. She feels her sx have worsened in frequency and intensity.  She has been taking Toradol and Fioricet with increasing frequency.  She has migraines at least three days per week. This has interfered  with her ability to work - she is a Acupuncturist and works in Psychologist, sport and exercise.  She would like referral to Neuro.  She has failed triptans in the past. She found that Nurtec and Ubrelvy were ineffective prior to her most recent pregnancy. She has not tried them since.  She is no longer breastfeeding. Described as dull, throbbing     Past Medical History:  Diagnosis Date   Abnormal Pap smear    CIN I   Anemia    Anxiety    Depression    off meds now, ok now   Enlarged thyroid gland    nromal function   GERD (gastroesophageal reflux disease)    Migraine    Obesity    Ovarian cyst    Vaginal Pap smear, abnormal    ok now   Past Surgical History:  Procedure Laterality Date   GASTRIC BYPASS  08/14/2022   Revision of sleeve/hiatal hernia repair   LAPAROSCOPIC GASTRIC SLEEVE RESECTION  2017     No outpatient medications have been marked as taking for the 02/08/23 encounter (Video Visit) with Glendale Chard, MD.     Allergies:   Novocain [procaine] and Povidone iodine   Social History   Tobacco Use   Smoking status: Former    Years: 4.00    Types: Cigarettes    Quit date: 06/07/2013    Years since quitting: 9.6   Smokeless tobacco: Never   Tobacco comments:    only smoked on the weekends  Vaping Use   Vaping Use: Never used  Substance  Use Topics   Alcohol use: Not Currently    Comment: SOCIAL    Drug use: No     Family Hx: The patient's family history includes Arthritis in her mother; COPD in her paternal grandfather; Cancer in her maternal grandfather; Diabetes in her maternal uncle; Heart disease in her paternal grandmother; Hypertension in her maternal uncle; Leukemia in her maternal grandmother; Stroke in her maternal grandmother; Thyroid disease in her mother.  ROS:   Please see the history of present illness.    Review of Systems  Constitutional: Negative.   HENT: Negative.    Respiratory: Negative.    Cardiovascular: Negative.   Gastrointestinal:  Negative.   Neurological:  Positive for headaches.  Endo/Heme/Allergies: Negative.   Psychiatric/Behavioral: Negative.      All other systems reviewed and are negative.   Labs/Other Tests and Data Reviewed:    Recent Labs: 03/09/2022: Magnesium 2.1 10/25/2022: ALT 9; BUN 10; Creatinine 0.6; Hemoglobin 10.2; Platelets 428; Potassium 3.8; Sodium 137; TSH 0.53   Recent Lipid Panel Lab Results  Component Value Date/Time   CHOL 200 (H) 03/09/2022 09:52 AM   TRIG 48 03/09/2022 09:52 AM   HDL 74 03/09/2022 09:52 AM   CHOLHDL 2.7 03/09/2022 09:52 AM   LDLCALC 117 (H) 03/09/2022 09:52 AM    Wt Readings from Last 3 Encounters:  02/08/23 196 lb (88.9 kg)  10/30/22 246 lb (111.6 kg)  04/21/22 246 lb (111.6 kg)     Exam:    Vital Signs:  Wt 196 lb (88.9 kg)   BMI 38.28 kg/m     Physical Exam Vitals and nursing note reviewed.  Constitutional:      Appearance: Normal appearance.  HENT:     Head: Normocephalic and atraumatic.  Eyes:     Extraocular Movements: Extraocular movements intact.  Pulmonary:     Effort: Pulmonary effort is normal.  Musculoskeletal:     Cervical back: Normal range of motion.  Neurological:     Mental Status: She is alert and oriented to person, place, and time.  Psychiatric:        Mood and Affect: Affect normal.     ASSESSMENT & PLAN:    1. Intractable migraine without aura and without status migrainosus Comments: I will refer her to Neuro as requested. She agrees to pick up Nurtec samples to take daily. If ineffective, will switch to Qulipta if Neuro eval is too far out. - Ambulatory referral to Neurology  2. Other iron deficiency anemia Comments: Outside labs reviewed.She is s/p gastric sleeve revision to bypass. Reports compliance w/ Fusion Plus. Unable to swallow large pills, consider iron infusion.  3. Class 2 obesity due to excess calories without serious comorbidity with body mass index (BMI) of 38.0 to 38.9 in adult Comments: She has  lost 57 pounds since August 2023!! She was congratulated on her progress thus far and encouraged to keep up the great work!    COVID-19 Education: The signs and symptoms of COVID-19 were discussed with the patient and how to seek care for testing (follow up with PCP or arrange E-visit).  The importance of social distancing was discussed today.  Patient Risk:   After full review of this patients clinical status, I feel that they are at least moderate risk at this time.  Time:   Today, I have spent 20 minutes/ seconds with the patient with telehealth technology discussing above diagnoses.  This is failed VIDEO visit. We initially connected and I was able to see she  was in a private office in no distress. After approximately 60 sec, mic went out. I then completed the visit via a phone call.    Medication Adjustments/Labs and Tests Ordered: Current medicines are reviewed at length with the patient today.  Concerns regarding medicines are outlined above.   Tests Ordered: Orders Placed This Encounter  Procedures   Ambulatory referral to Neurology    Medication Changes: No orders of the defined types were placed in this encounter.   Disposition:  Follow up prn  Signed, [name], MD

## 2023-02-10 ENCOUNTER — Other Ambulatory Visit: Payer: Self-pay | Admitting: Internal Medicine

## 2023-02-13 ENCOUNTER — Encounter: Payer: Self-pay | Admitting: Internal Medicine

## 2023-03-02 DIAGNOSIS — F411 Generalized anxiety disorder: Secondary | ICD-10-CM | POA: Diagnosis not present

## 2023-03-13 ENCOUNTER — Other Ambulatory Visit: Payer: Self-pay | Admitting: Internal Medicine

## 2023-03-15 NOTE — Progress Notes (Signed)
Referring:  Glendale Chard, MD [address] [place],  [place] [address]  PCP: Glendale Chard, MD  Neurology was asked to evaluate Jessica Banks, a 42 year old female for a chief complaint of headaches.  Our recommendations of care will be communicated by shared medical record.    CC:  headaches  History provided from self  HPI:  Medical co-morbidities: s/p gastric bypass, iron deficiency  The patient presents for evaluation of headaches which began around age 89. She did have a normal MRI as a child. Had a period of time between adolescence and college where she did not have migraines. They then started to return after college. Headaches are associated with photophobia, phonophobia, and osmophobia. She currently averages 12 migraine days per month.  Previous took Nurtec which was helpful, but had to stop this during pregnancy and has not had a chance to restart it. Took Fioricet during pregnancy which was helpful. Has also taken Flexeril as needed for neck spasms. She has tried multiple preventives which she has failed due to lack of efficacy.  Headache History: Onset: age 58 Aura: no Associated Symptoms:  Photophobia: yes  Phonophobia: yes Worse with activity?: yes Duration of headaches: 2 days  Migraine days per month: 12 Headache free days per month: 18  Current Treatment: Abortive Fioricet Flexeril  Preventative none  Prior Therapies                                 Rescue: Toradol Fioricet Flexeril Imitrex Maxalt 10 mg PRN Nurtec - helpful Ubrelvy - lack of efficacy  Preventive: Propranolol - lightheadedness Topamax - paresthesias Zonisamide Celexa Sertraline Aimovig - lack of efficacy Trigger point injections   LABS: CBC    Component Value Date/Time   WBC 6.4 02/06/2023 0000   WBC 8.5 01/30/2022 1648   RBC 3.93 02/06/2023 0000   HGB 10.0 (A) 02/06/2023 0000   HGB 11.1 09/23/2020 0815   HGB 10.3 05/11/2020 0000   HCT 31 (A)  02/06/2023 0000   HCT 34.5 09/23/2020 0815   HCT 32 05/11/2020 0000   PLT 381 02/06/2023 0000   PLT 284 09/23/2020 0815   PLT 416 05/11/2020 0000   MCV 86.9 01/30/2022 1648   MCV 86 09/23/2020 0815   MCH 26.0 01/30/2022 1648   MCHC 29.9 (L) 01/30/2022 1648   RDW 14.3 01/30/2022 1648   RDW 14.5 09/23/2020 0815   LYMPHSABS 2.2 04/23/2019 0105   MONOABS 0.6 04/23/2019 0105   EOSABS 0.1 04/23/2019 0105   BASOSABS 0.0 04/23/2019 0105      Latest Ref Rng & Units 02/06/2023   12:00 AM 10/25/2022   12:00 AM 06/21/2022   11:06 AM  CMP  Glucose 70 - 99 mg/dL   90   BUN 4 - 21 12     10     7    Creatinine 0.5 - 1.1 0.6     0.6     0.66   Sodium 137 - 147 138     137     140   Potassium 3.5 - 5.1 mEq/L 4.2     3.8     4.5   Chloride 99 - 108 105     103     100   CO2 13 - 22 25     21     24    Calcium 8.7 - 10.7 9.5     9.4  9.2   Total Protein 6.0 - 8.5 g/dL   7.2   Total Bilirubin 0.0 - 1.2 mg/dL   0.2   Alkaline Phos 25 - 125 197     167     215   AST 13 - 35 15     19     19    ALT 7 - 35 U/L 7     9     11       This result is from an external source.     IMAGING:  Patient reports normal MRIs in the past, reports currently unavailable for review  Current Outpatient Medications on File Prior to Visit  Medication Sig Dispense Refill   butalbital-acetaminophen-caffeine (FIORICET) 50-325-40 MG tablet Take 1 tablet by mouth every 6 (six) hours as needed. 30 tablet 0   cholecalciferol (VITAMIN D3) 25 MCG (1000 UNIT) tablet Take by mouth daily.     Iron-FA-B Cmp-C-Biot-Probiotic (FUSION PLUS) CAPS One capsule po qd 30 capsule 1   temazepam (RESTORIL) 15 MG capsule TAKE 1 CAPSULE BY MOUTH AT BEDTIME AS NEEDED FOR SLEEP. **DNF 04/08/22** 30 capsule 1   Biotin w/ Vitamins C & E (HAIR/SKIN/NAILS PO) Take by mouth. 1 per day     dexlansoprazole (DEXILANT) 60 MG capsule TAKE 1 CAPSULE BY MOUTH EVERY DAY 30 capsule 3   dexlansoprazole (DEXILANT) 60 MG capsule Take 1 capsule (60 mg total)  by mouth daily. 30 capsule 1   famotidine (PEPCID) 10 MG tablet  (Patient not taking: Reported on 10/30/2022)     ibuprofen (ADVIL) 600 MG tablet Take 600 mg by mouth every 6 (six) hours as needed for headache or moderate pain. (Patient not taking: Reported on 10/30/2022)     magnesium oxide (MAG-OX) 400 MG tablet Take 400 mg by mouth daily. (Patient not taking: Reported on 10/30/2022)     Multiple Vitamin (MULTIVITAMIN WITH MINERALS) TABS tablet Take 1 tablet by mouth daily. (Patient not taking: Reported on 10/30/2022)     No current facility-administered medications on file prior to visit.     Allergies: Allergies  Allergen Reactions   Novocain [Procaine] Anaphylaxis   Povidone Iodine Rash    Rash    Family History: Family History  Problem Relation Age of Onset   Thyroid disease Mother        s/p thyroidectomy   Arthritis Mother    Leukemia Maternal Grandmother    Stroke Maternal Grandmother    Cancer Maternal Grandfather        lung   Heart disease Paternal Grandmother    COPD Paternal Grandfather    Hypertension Maternal Uncle    Diabetes Maternal Uncle      Past Medical History: Past Medical History:  Diagnosis Date   Abnormal Pap smear    CIN I   Anemia    Anxiety    Depression    off meds now, ok now   Enlarged thyroid gland    nromal function   GERD (gastroesophageal reflux disease)    Migraine    Obesity    Ovarian cyst    Vaginal Pap smear, abnormal    ok now    Past Surgical History Past Surgical History:  Procedure Laterality Date   GASTRIC BYPASS  08/14/2022   Revision of sleeve/hiatal hernia repair   LAPAROSCOPIC GASTRIC SLEEVE RESECTION  2017    Social History: Social History   Tobacco Use   Smoking status: Former    Years: 4    Types: Cigarettes  Quit date: 06/07/2013    Years since quitting: 9.7   Smokeless tobacco: Never   Tobacco comments:    only smoked on the weekends  Vaping Use   Vaping Use: Never used  Substance Use  Topics   Alcohol use: Not Currently    Comment: SOCIAL    Drug use: No    ROS: Negative for fevers, chills. Positive for headaches. All other systems reviewed and negative unless stated otherwise in HPI.   Physical Exam:   Vital Signs: BP 127/73   Pulse 83   Ht 5' (1.524 m)   Wt 199 lb 9.6 oz (90.5 kg)   BMI 38.98 kg/m  GENERAL: well appearing,in no acute distress,alert SKIN:  Color, texture, turgor normal. No rashes or lesions HEAD:  Normocephalic/atraumatic. CV:  RRR RESP: Normal respiratory effort  NEUROLOGICAL: Mental Status: Alert, oriented to person, place and time,Follows commands Cranial Nerves: PERRL, visual fields intact to confrontation, extraocular movements intact, facial sensation intact, no facial droop or ptosis, hearing grossly intact, no dysarthria Motor: muscle strength 5/5 both upper and lower extremities Sensation: intact to light touch all 4 extremities Coordination: Finger-to- nose-finger intact bilaterally Gait: normal-based   IMPRESSION: 42 year old female with a history of gastric bypass, iron deficiency who presents for evaluation of migraines. She has failed multiple preventives due to side effects and lack of efficacy. Will start Qulipta for prevention. She would also like to change her antidepressant as she does not feel this has been effective. Will switch to Cymbalta, which may also help with migraine prevention. Nurtec restarted for rescue.   PLAN: -Prevention: Start Qulipta 60 mg daily, Wean Zoloft and start Cymbalta  -Rescue: Start Nurtec 75 mg PRN -Patient requests referral to Psychiatry for ADHD evaluation. Referral to Psychiatry placed -Next steps: consider Botox, alternate CGRP   I spent a total of 35 minutes chart reviewing and counseling the patient. Headache education was done. Discussed treatment options including preventive and acute medications. Discussed medication side effects, adverse reactions and drug interactions. Written  educational materials and patient instructions outlining all of the above were given.  Follow-up: 8 months   Genia Harold, MD 03/16/2023   9:31 AM

## 2023-03-16 ENCOUNTER — Encounter: Payer: Self-pay | Admitting: Psychiatry

## 2023-03-16 ENCOUNTER — Other Ambulatory Visit: Payer: Self-pay | Admitting: Psychiatry

## 2023-03-16 ENCOUNTER — Ambulatory Visit (INDEPENDENT_AMBULATORY_CARE_PROVIDER_SITE_OTHER): Payer: BC Managed Care – PPO | Admitting: Psychiatry

## 2023-03-16 ENCOUNTER — Ambulatory Visit: Payer: BC Managed Care – PPO

## 2023-03-16 VITALS — BP 127/73 | HR 83 | Ht 60.0 in | Wt 199.6 lb

## 2023-03-16 DIAGNOSIS — Z1339 Encounter for screening examination for other mental health and behavioral disorders: Secondary | ICD-10-CM

## 2023-03-16 DIAGNOSIS — G43019 Migraine without aura, intractable, without status migrainosus: Secondary | ICD-10-CM

## 2023-03-16 MED ORDER — CYCLOBENZAPRINE HCL 10 MG PO TABS: 10.0000 mg | ORAL_TABLET | ORAL | 6 refills | Status: DC | PRN

## 2023-03-16 MED ORDER — QULIPTA 60 MG PO TABS: 60.0000 mg | ORAL_TABLET | Freq: Every day | ORAL | 6 refills | Status: DC

## 2023-03-16 MED ORDER — DULOXETINE HCL 20 MG PO CPEP: ORAL_CAPSULE | ORAL | 6 refills | Status: DC

## 2023-03-16 MED ORDER — NURTEC 75 MG PO TBDP: 75.0000 mg | ORAL_TABLET | ORAL | 6 refills | Status: DC | PRN

## 2023-03-16 NOTE — Patient Instructions (Addendum)
Decrease Zoloft to 1/2 pill daily for one week, then stop and start Cymbalta. Take 20 mg daily for one week, then increase to 40 mg daily  Start Qulipta 60 mg daily  Referral to Psychiatry for ADHD evaluation

## 2023-03-19 ENCOUNTER — Telehealth: Payer: Self-pay | Admitting: *Deleted

## 2023-03-19 ENCOUNTER — Telehealth: Payer: Self-pay | Admitting: Psychiatry

## 2023-03-19 ENCOUNTER — Other Ambulatory Visit (HOSPITAL_COMMUNITY): Payer: Self-pay

## 2023-03-19 NOTE — Telephone Encounter (Signed)
Request sent to Prior Auth team.

## 2023-03-19 NOTE — Telephone Encounter (Signed)
See telephone encounter 03/19/23

## 2023-03-19 NOTE — Telephone Encounter (Signed)
   Dr.Chima I called [facility] and was told Prior Auth is not needed, Nurtec is not a covered medication per pharmacy. You prescribed as rescue drug.   Please advise

## 2023-03-19 NOTE — Telephone Encounter (Signed)
   [facility] route replied to Pod 1

## 2023-03-19 NOTE — Telephone Encounter (Signed)
Referral sent to [facility], phone # [phone].

## 2023-03-19 NOTE — Telephone Encounter (Signed)
Pharmacy Patient Advocate Encounter   Received notification from [place] that prior authorization for Qulipta 60MG  tablets is required/requested.   PA submitted on 03/19/2023 to (ins) [facility] BC via CoverMyMeds Key or (Medicaid) confirmation # [id number] Status is pending

## 2023-03-20 ENCOUNTER — Encounter: Payer: Self-pay | Admitting: Psychiatry

## 2023-03-20 MED ORDER — NARATRIPTAN HCL 2.5 MG PO TABS: 2.5000 mg | ORAL_TABLET | ORAL | 6 refills | Status: DC | PRN

## 2023-03-20 NOTE — Addendum Note (Signed)
Addended by: Genia Harold on: 03/20/2023 12:27 PM   Modules accepted: Orders

## 2023-03-20 NOTE — Telephone Encounter (Signed)
I sent an rx for naratriptan to her pharmacy instead. This medication tends to have fewer side effects than the other triptans. She can try taking this for rescue

## 2023-03-20 NOTE — Telephone Encounter (Signed)
Called pt and let her know that Dr. Billey Gosling had sent her in [place] in her pharmacy and she is to use that for her rescue. Pt verbalized understanding.

## 2023-03-22 ENCOUNTER — Other Ambulatory Visit (HOSPITAL_COMMUNITY): Payer: Self-pay

## 2023-03-22 DIAGNOSIS — H40033 Anatomical narrow angle, bilateral: Secondary | ICD-10-CM | POA: Diagnosis not present

## 2023-03-22 DIAGNOSIS — H1013 Acute atopic conjunctivitis, bilateral: Secondary | ICD-10-CM | POA: Diagnosis not present

## 2023-03-22 NOTE — Telephone Encounter (Signed)
Insurance requested additional clinical information-clinical questions answered-asked for expedited review-will update once we receive an answer.

## 2023-03-23 NOTE — Telephone Encounter (Signed)
Pharmacy Patient Advocate Encounter  Received notification from [facility] that the request for prior authorization for Qulipta 60MG  Tablet has been denied due to See below.      Please be advised we currently do not have a Pharmacist to review denials, therefore you will need to process appeals accordingly as needed. Thanks for your support at this time.  Denial letter has been scanned into chart under media tab for appeal options

## 2023-03-26 ENCOUNTER — Encounter: Payer: Self-pay | Admitting: Psychiatry

## 2023-03-26 MED ORDER — EMGALITY 120 MG/ML ~~LOC~~ SOAJ: 1.0000 | SUBCUTANEOUS | 6 refills | Status: DC

## 2023-03-26 MED ORDER — EMGALITY 120 MG/ML ~~LOC~~ SOAJ: 2.0000 | Freq: Once | SUBCUTANEOUS | 0 refills | Status: AC

## 2023-03-26 NOTE — Telephone Encounter (Signed)
Dr. Delena Bali, how do you want to proceed? Would you like to switch to formulary option? I do not see where she has tried [employer] or [employer]

## 2023-03-26 NOTE — Telephone Encounter (Signed)
LVM telling pt Dr. Delena Bali sent her in [place] to her pharmacy.

## 2023-03-26 NOTE — Addendum Note (Signed)
Addended by: Ocie Doyne on: 03/26/2023 11:55 AM   Modules accepted: Orders

## 2023-03-26 NOTE — Addendum Note (Signed)
Addended by: Ocie Doyne on: 03/26/2023 11:54 AM   Modules accepted: Orders

## 2023-03-26 NOTE — Telephone Encounter (Signed)
Called pt back to let her know that her insurance denied her Bennie Pierini and that is why Dr. Delena Bali sent her in [place]. Told pt that she is to take 2 pens in the 1st month and then after that she is to do 1 pen monthly. Told pt she will get a link with instructions on how to use the pen. Pt verbalized understanding.     [email or web address].[email or web address]/[email or web address]-[email or web address]-us-[email or web address]

## 2023-03-26 NOTE — Telephone Encounter (Signed)
We can try Emgality. I sent an rx to her pharmacy

## 2023-03-27 ENCOUNTER — Telehealth: Payer: Self-pay | Admitting: *Deleted

## 2023-03-27 NOTE — Telephone Encounter (Signed)
Pt said Em[employer]ality need Prior Auth.   Please route replies to Pod 1

## 2023-03-27 NOTE — Telephone Encounter (Signed)
Patient Advocate Encounter   Received notification that prior authorization for Emgality 120MG /ML auto-injectors (migraine) is required.   PA submitted on 03/27/2023 Key [id number] Insurance [employer] [place] Commercial Electronic Request Form Status is pending

## 2023-03-28 ENCOUNTER — Ambulatory Visit (INDEPENDENT_AMBULATORY_CARE_PROVIDER_SITE_OTHER): Payer: BC Managed Care – PPO | Admitting: Internal Medicine

## 2023-03-28 ENCOUNTER — Encounter: Payer: Self-pay | Admitting: Internal Medicine

## 2023-03-28 VITALS — BP 126/78 | Temp 98.4°F | Ht 60.0 in | Wt 195.0 lb

## 2023-03-28 DIAGNOSIS — Z0001 Encounter for general adult medical examination with abnormal findings: Secondary | ICD-10-CM | POA: Diagnosis not present

## 2023-03-28 DIAGNOSIS — D508 Other iron deficiency anemias: Secondary | ICD-10-CM | POA: Diagnosis not present

## 2023-03-28 DIAGNOSIS — D649 Anemia, unspecified: Secondary | ICD-10-CM | POA: Diagnosis not present

## 2023-03-28 DIAGNOSIS — Z0000 Encounter for general adult medical examination without abnormal findings: Secondary | ICD-10-CM

## 2023-03-28 DIAGNOSIS — F419 Anxiety disorder, unspecified: Secondary | ICD-10-CM

## 2023-03-28 DIAGNOSIS — F411 Generalized anxiety disorder: Secondary | ICD-10-CM | POA: Diagnosis not present

## 2023-03-28 DIAGNOSIS — L304 Erythema intertrigo: Secondary | ICD-10-CM

## 2023-03-28 DIAGNOSIS — R7989 Other specified abnormal findings of blood chemistry: Secondary | ICD-10-CM | POA: Diagnosis not present

## 2023-03-28 DIAGNOSIS — Z6838 Body mass index (BMI) 38.0-38.9, adult: Secondary | ICD-10-CM

## 2023-03-28 DIAGNOSIS — Z79899 Other long term (current) drug therapy: Secondary | ICD-10-CM | POA: Diagnosis not present

## 2023-03-28 DIAGNOSIS — E215 Disorder of parathyroid gland, unspecified: Secondary | ICD-10-CM | POA: Diagnosis not present

## 2023-03-28 MED ORDER — NYSTATIN 100000 UNIT/GM EX POWD
1.0000 | Freq: Three times a day (TID) | CUTANEOUS | 3 refills | Status: DC
Start: 2023-03-28 — End: 2023-12-05

## 2023-03-28 NOTE — Progress Notes (Signed)
I,Jessica Banks,acting as a scribe for Jessica Alimentobyn N Traevon Meiring, MD.,have documented all relevant documentation on the behalf of Jessica Alimentobyn N Lyndsy Gilberto, MD,as directed by  Jessica Alimentobyn N Belkys Henault, MD while in the presence of Jessica Alimentobyn N Kristinia Leavy, MD.   Subjective:     Patient ID: Jessica Banks , female    DOB: Mar 06, 1981 , 42 y.o.   MRN: [id number]   Chief Complaint  Patient presents with   Annual Exam   Anemia   Anxiety   Migraine   Insomnia    HPI  Patient presents today for annual exam. She is no longer followed by GYN for her pelvic exams. She does not wish to complete PAP today.       Past Medical History:  Diagnosis Date   Abnormal Pap smear    CIN I   Anemia    Anxiety    Depression    off meds now, ok now   Enlarged thyroid gland    nromal function   GERD (gastroesophageal reflux disease)    Migraine    Obesity    Ovarian cyst    Vaginal Pap smear, abnormal    ok now     Family History  Problem Relation Age of Onset   Thyroid disease Mother        s/p thyroidectomy   Arthritis Mother    Leukemia Maternal Grandmother    Stroke Maternal Grandmother    Cancer Maternal Grandfather        lung   Heart disease Paternal Grandmother    COPD Paternal Grandfather    Hypertension Maternal Uncle    Diabetes Maternal Uncle      Current Outpatient Medications:    butalbital-acetaminophen-caffeine (FIORICET) 50-325-40 MG tablet, Take 1 tablet by mouth every 6 (six) hours as needed., Disp: 30 tablet, Rfl: 0   cholecalciferol (VITAMIN D3) 25 MCG (1000 UNIT) tablet, Take by mouth daily., Disp: , Rfl:    cyclobenzaprine (FLEXERIL) 10 MG tablet, Take 1 tablet (10 mg total) by mouth as needed., Disp: 30 tablet, Rfl: 6   DULoxetine (CYMBALTA) 20 MG capsule, Take 20 mg daily for one week, then increase to 40 mg daily, Disp: 60 capsule, Rfl: 6   Iron-FA-B Cmp-C-Biot-Probiotic (FUSION PLUS) CAPS, One capsule po qd, Disp: 30 capsule, Rfl: 1   Melatonin 10 MG CAPS, Take by mouth., Disp: ,  Rfl:    naratriptan (AMERGE) 2.5 MG tablet, Take 1 tablet (2.5 mg total) by mouth as needed. Take one (1) tablet at onset of headache; if returns or does not resolve, may repeat after 4 hours; do not exceed five (5) mg in 24 hours., Disp: 10 tablet, Rfl: 6   nystatin powder, Apply 1 Application topically 3 (three) times daily. prn, Disp: 60 g, Rfl: 3   temazepam (RESTORIL) 15 MG capsule, TAKE 1 CAPSULE BY MOUTH AT BEDTIME AS NEEDED FOR SLEEP. **DNF 04/08/22**, Disp: 30 capsule, Rfl: 1   Galcanezumab-gnlm (EMGALITY) 120 MG/ML SOAJ, Inject 1 Pen into the skin every 30 (thirty) days. (Patient not taking: Reported on 03/28/2023), Disp: 1.12 mL, Rfl: 6   Allergies  Allergen Reactions   Novocain [Procaine] Anaphylaxis   Povidone Iodine Rash    Rash      The patient states she uses none for birth control. Last LMP was Patient's last menstrual period was 03/16/2023 (exact date).. Negative for Dysmenorrhea. Negative for: breast discharge, breast lump(s), breast pain and breast self exam. Associated symptoms include abnormal vaginal bleeding. Pertinent negatives include abnormal bleeding (  hematology), anxiety, decreased libido, depression, difficulty falling sleep, dyspareunia, history of infertility, nocturia, sexual dysfunction, sleep disturbances, urinary incontinence, urinary urgency, vaginal discharge and vaginal itching. Diet regular.The patient states her exercise level is    . The patient's tobacco use is:  Social History   Tobacco Use  Smoking Status Former   Years: 4   Types: Cigarettes   Quit date: 06/07/2013   Years since quitting: 9.8  Smokeless Tobacco Never  Tobacco Comments   only smoked on the weekends  . She has been exposed to passive smoke. The patient's alcohol use is:  Social History   Substance and Sexual Activity  Alcohol Use Not Currently   Comment: SOCIAL     Review of Systems  Constitutional: Negative.   HENT: Negative.    Eyes: Negative.   Respiratory: Negative.     Cardiovascular: Negative.   Gastrointestinal: Negative.   Endocrine: Negative.   Genitourinary: Negative.   Musculoskeletal: Negative.   Skin: Negative.   Allergic/Immunologic: Negative.   Neurological:  Positive for headaches.       She has been eval by Neuro for migraines. She has tried samples of Quillipta; however, her insurance does not cover it. She has been prescribed Emgality, awaiting approval now.   Hematological: Negative.   Psychiatric/Behavioral: Negative.       Today's Vitals   03/28/23 0904  BP: 126/78  Temp: 98.4 F (36.9 C)  SpO2: 98%  Weight: 195 lb (88.5 kg)  Height: 5' (1.524 m)   Body mass index is 38.08 kg/m.  Wt Readings from Last 3 Encounters:  03/28/23 195 lb (88.5 kg)  03/16/23 199 lb 9.6 oz (90.5 kg)  02/08/23 196 lb (88.9 kg)    Objective:  Physical Exam Vitals and nursing note reviewed.  Constitutional:      Appearance: Normal appearance.  HENT:     Head: Normocephalic and atraumatic.     Right Ear: Tympanic membrane, ear canal and external ear normal.     Left Ear: Tympanic membrane, ear canal and external ear normal.     Nose:     Comments: Masked     Mouth/Throat:     Comments: Masked  Eyes:     Extraocular Movements: Extraocular movements intact.     Conjunctiva/sclera: Conjunctivae normal.     Pupils: Pupils are equal, round, and reactive to light.  Cardiovascular:     Rate and Rhythm: Normal rate and regular rhythm.     Pulses: Normal pulses.     Heart sounds: Normal heart sounds.  Pulmonary:     Effort: Pulmonary effort is normal.     Breath sounds: Normal breath sounds.  Chest:  Breasts:    Tanner Score is 5.     Right: Normal.     Left: Normal.     Comments: tattoo Abdominal:     General: Bowel sounds are normal.     Palpations: Abdomen is soft.     Comments: Obese, soft  Genitourinary:    Comments: deferred Musculoskeletal:        General: Normal range of motion.     Cervical back: Normal range of motion and  neck supple.  Skin:    General: Skin is warm and dry.     Findings: Rash present.     Comments: Irritation beneath pannus, no erythema  Neurological:     General: No focal deficit present.     Mental Status: She is alert and oriented to person, place, and time.  Psychiatric:  Mood and Affect: Mood normal.        Behavior: Behavior normal.     Assessment And Plan:     1. Encounter for general adult medical examination w/o abnormal findings Comments: A full exam was performed.  Importance of monthly self breast exams was discussed with the patient.  She wlil consult w/ midwife for her pap smears. PATIENT IS ADVISED TO GET 30-45 MINUTES REGULAR EXERCISE NO LESS THAN FOUR TO FIVE DAYS PER WEEK - BOTH WEIGHTBEARING EXERCISES AND AEROBIC ARE RECOMMENDED.  PATIENT IS ADVISED TO FOLLOW A HEALTHY DIET WITH AT LEAST SIX FRUITS/VEGGIES PER DAY, DECREASE INTAKE OF RED MEAT, AND TO INCREASE FISH INTAKE TO TWO DAYS PER WEEK.  MEATS/FISH SHOULD NOT BE FRIED, BAKED OR BROILED IS PREFERABLE.  IT IS ALSO IMPORTANT TO CUT BACK ON YOUR SUGAR INTAKE. PLEASE AVOID ANYTHING WITH ADDED SUGAR, CORN SYRUP OR OTHER SWEETENERS. IF YOU MUST USE A SWEETENER, YOU CAN TRY STEVIA. IT IS ALSO IMPORTANT TO AVOID ARTIFICIALLY SWEETENERS AND DIET BEVERAGES. LASTLY, I SUGGEST WEARING SPF 50 SUNSCREEN ON EXPOSED PARTS AND ESPECIALLY WHEN IN THE DIRECT SUNLIGHT FOR AN EXTENDED PERIOD OF TIME.  PLEASE AVOID FAST FOOD RESTAURANTS AND INCREASE YOUR WATER INTAKE. - CMP14+EGFR - CBC - Lipid panel  2. Other iron deficiency anemia Comments: Possibly related to bariatric surgery. I will check iron panel today. - Iron, TIBC and Ferritin Panel  3. Anxiety Comments: She is now on duloxetine as per Neuro. May titrate if needed.  4. Elevated parathyroid hormone Comments: Likely due to low vitamin D levels. I will check repeat PTH level today and again in four months after she has been taking vitamin D consistently. - Parathyroid  Hormone, Intact w/Ca  5. Intertrigo - nystatin powder; Apply 1 Application topically 3 (three) times daily. prn  Dispense: 60 g; Refill: 3  6. Class 2 severe obesity due to excess calories with serious comorbidity and body mass index (BMI) of 38.0 to 38.9 in adult Comments: She was congratulated on her 50+ weight loss and is encouraged to keep up the great work. She is advised to aim for at least 150 minutes of exercise per week.  Patient was given opportunity to ask questions. Patient verbalized understanding of the plan and was able to repeat key elements of the plan. All questions were answered to their satisfaction.   I, Jessica Aliment, MD, have reviewed all documentation for this visit. The documentation on 03/28/23 for the exam, diagnosis, procedures, and orders are all accurate and complete.  THE PATIENT IS ENCOURAGED TO PRACTICE SOCIAL DISTANCING DUE TO THE COVID-19 PANDEMIC.

## 2023-03-28 NOTE — Patient Instructions (Signed)

## 2023-03-29 LAB — IRON,TIBC AND FERRITIN PANEL
Ferritin: 19 ng/mL (ref 15–150)
Iron Saturation: 26 % (ref 15–55)
Iron: 86 ug/dL (ref 27–159)
Total Iron Binding Capacity: 334 ug/dL (ref 250–450)
UIBC: 248 ug/dL (ref 131–425)

## 2023-03-29 LAB — CMP14+EGFR
ALT: 11 IU/L (ref 0–32)
AST: 19 IU/L (ref 0–40)
Albumin/Globulin Ratio: 1.6 (ref 1.2–2.2)
Albumin: 4.2 g/dL (ref 3.9–4.9)
Alkaline Phosphatase: 205 IU/L — ABNORMAL HIGH (ref 44–121)
BUN/Creatinine Ratio: 16 (ref 9–23)
BUN: 10 mg/dL (ref 6–24)
Bilirubin Total: <0.2 mg/dL (ref 0.0–1.2)
CO2: 24 mmol/L (ref 20–29)
Calcium: 9.8 mg/dL (ref 8.7–10.2)
Chloride: 102 mmol/L (ref 96–106)
Creatinine, Ser: 0.62 mg/dL (ref 0.57–1.00)
Globulin, Total: 2.7 g/dL (ref 1.5–4.5)
Glucose: 91 mg/dL (ref 70–99)
Potassium: 4.6 mmol/L (ref 3.5–5.2)
Sodium: 138 mmol/L (ref 134–144)
Total Protein: 6.9 g/dL (ref 6.0–8.5)
eGFR: 115 mL/min/{1.73_m2} (ref >59)

## 2023-03-29 LAB — CBC
Hematocrit: 36.8 % (ref 34.0–46.6)
Hemoglobin: 11.3 g/dL (ref 11.1–15.9)
MCH: 26 pg — ABNORMAL LOW (ref 26.6–33.0)
MCHC: 30.7 g/dL — ABNORMAL LOW (ref 31.5–35.7)
MCV: 85 fL (ref 79–97)
Platelets: 362 10*3/uL (ref 150–450)
RBC: 4.35 x10E6/uL (ref 3.77–5.28)
RDW: 15.6 % — ABNORMAL HIGH (ref 11.7–15.4)
WBC: 5 10*3/uL (ref 3.4–10.8)

## 2023-03-29 LAB — LIPID PANEL
Chol/HDL Ratio: 2.4 ratio (ref 0.0–4.4)
Cholesterol, Total: 191 mg/dL (ref 100–199)
HDL: 81 mg/dL (ref >39)
LDL Chol Calc (NIH): 101 mg/dL — ABNORMAL HIGH (ref 0–99)
Triglycerides: 49 mg/dL (ref 0–149)
VLDL Cholesterol Cal: 9 mg/dL (ref 5–40)

## 2023-03-29 LAB — PTH, INTACT AND CALCIUM: PTH: 72 pg/mL — ABNORMAL HIGH (ref 15–65)

## 2023-04-03 ENCOUNTER — Other Ambulatory Visit (HOSPITAL_COMMUNITY): Payer: Self-pay

## 2023-04-04 NOTE — Telephone Encounter (Deleted)
   Also is there going to be an RX for a load ing dose with 2 pens or are you just requesting one pen? Please advise.

## 2023-04-04 NOTE — Telephone Encounter (Signed)
I had to resubmit the PA because [employer] could not recognize the PT under the previous PA-PT name must be entered a certain way for [employer] to match the PT information-I asked for an urgent request today-will update as soon as we have an answer. Here is the new Key and how the PT name needs to be entered into [facility].

## 2023-04-04 NOTE — Telephone Encounter (Signed)
Monica, I do not see a determination on this PA. Do you have an update on this?

## 2023-04-05 ENCOUNTER — Other Ambulatory Visit: Payer: Self-pay | Admitting: Psychiatry

## 2023-04-05 MED ORDER — EMGALITY 120 MG/ML ~~LOC~~ SOAJ: 2.0000 | Freq: Once | SUBCUTANEOUS | 0 refills | Status: AC

## 2023-04-05 NOTE — Telephone Encounter (Signed)
Pharmacy Patient Advocate Encounter  Prior Authorization for Emgality /ML auto-injectors (migraine) has been approved by [facility] (ins).    PA # [id number] Effective dates: 04/03/2023 through 06/26/2023

## 2023-04-05 NOTE — Telephone Encounter (Signed)
She does need a loading dose of the Emgality. I put a new order in for the loading dose. She'll likely have to try Emgality for 2-3 months (or have side effects on it) before insurance would consider it a failure. We can always retry for Qulipta at that point if she doesn't notice improvement after a few of the Emgality injections

## 2023-04-06 ENCOUNTER — Other Ambulatory Visit (HOSPITAL_COMMUNITY): Payer: Self-pay

## 2023-04-06 NOTE — Telephone Encounter (Signed)
PA was done and approved for only the maintenance dose since there was not a loading dose RX in the chart or request for loading dose-per test billing it will not cover 2 pens-will submit a PA today for the loading dose.

## 2023-04-09 NOTE — Telephone Encounter (Signed)
Monica, thank you. Any updated on PA for loading dose? (Please route replies to POD 1)

## 2023-04-10 ENCOUNTER — Telehealth: Payer: Self-pay

## 2023-04-10 DIAGNOSIS — F411 Generalized anxiety disorder: Secondary | ICD-10-CM | POA: Diagnosis not present

## 2023-04-10 NOTE — Telephone Encounter (Signed)
PA request submitted. New Encounter created for follow up. For additional info see Prior Auth telephone encounter from 03/27/2023.  PA for emgality maintenance dose has been approved.

## 2023-04-10 NOTE — Telephone Encounter (Signed)
Noted  

## 2023-04-10 NOTE — Telephone Encounter (Signed)
Pharmacy Patient Advocate Encounter  Prior Authorization for Emgality /ML auto-injectors (migraine) has been approved by [employer] (ins).    PA # Not Available Effective dates: 04/10/2023 through 05/09/2023  This is for the starter kit-2 pens for the loading dose

## 2023-04-10 NOTE — Telephone Encounter (Signed)
PA request submitted. New Encounter created for follow up. For additional info see Prior Auth telephone encounter from 03/27/2023.  PA was approved for Emgality loading dose-2 pens

## 2023-04-10 NOTE — Telephone Encounter (Signed)
Phone room: please call pt and let her know Emgality approved, she should now be able to pick up loading dose prescription (2 pens for first month) and maintenance dose (1 pen per month after completing loading dose)

## 2023-04-24 ENCOUNTER — Ambulatory Visit
Admission: RE | Admit: 2023-04-24 | Discharge: 2023-04-24 | Disposition: A | Discharge status: Home / Self Care | Payer: BC Managed Care – PPO | Source: Ambulatory Visit | Attending: Internal Medicine | Admitting: Internal Medicine

## 2023-04-24 DIAGNOSIS — Z1231 Encounter for screening mammogram for malignant neoplasm of breast: Secondary | ICD-10-CM

## 2023-05-07 ENCOUNTER — Other Ambulatory Visit (HOSPITAL_COMMUNITY): Payer: Self-pay

## 2023-05-21 ENCOUNTER — Encounter: Payer: Self-pay | Admitting: Internal Medicine

## 2023-05-21 ENCOUNTER — Other Ambulatory Visit: Payer: Self-pay | Admitting: Internal Medicine

## 2023-05-21 MED ORDER — TEMAZEPAM 15 MG PO CAPS: ORAL_CAPSULE | ORAL | 1 refills | Status: DC

## 2023-05-24 DIAGNOSIS — F411 Generalized anxiety disorder: Secondary | ICD-10-CM | POA: Diagnosis not present

## 2023-06-04 ENCOUNTER — Encounter: Payer: Self-pay | Admitting: Psychiatry

## 2023-06-04 ENCOUNTER — Other Ambulatory Visit: Payer: Self-pay | Admitting: *Deleted

## 2023-06-04 MED ORDER — DULOXETINE HCL 60 MG PO CPEP: 60.0000 mg | ORAL_CAPSULE | Freq: Every day | ORAL | 4 refills | Status: DC

## 2023-06-18 ENCOUNTER — Encounter: Payer: Self-pay | Admitting: Internal Medicine

## 2023-06-18 ENCOUNTER — Telehealth (INDEPENDENT_AMBULATORY_CARE_PROVIDER_SITE_OTHER): Payer: BC Managed Care – PPO | Admitting: Internal Medicine

## 2023-06-18 VITALS — Ht 60.0 in | Wt 192.6 lb

## 2023-06-18 DIAGNOSIS — E6609 Other obesity due to excess calories: Secondary | ICD-10-CM

## 2023-06-18 DIAGNOSIS — Z6837 Body mass index (BMI) 37.0-37.9, adult: Secondary | ICD-10-CM

## 2023-06-18 NOTE — Progress Notes (Signed)
Virtual Visit via Video   This visit type was conducted due to national recommendations for restrictions regarding the COVID-19 Pandemic (e.g. social distancing) in an effort to limit this patient's exposure and mitigate transmission in our community.  Due to her co-morbid illnesses, this patient is at least at moderate risk for complications without adequate follow up.  This format is felt to be most appropriate for this patient at this time.  All issues noted in this document were discussed and addressed.  A limited physical exam was performed with this format.    This visit type was conducted due to national recommendations for restrictions regarding the COVID-19 Pandemic (e.g. social distancing) in an effort to limit this patient's exposure and mitigate transmission in our community.  Patients identity confirmed using two different identifiers.  This format is felt to be most appropriate for this patient at this time.  All issues noted in this document were discussed and addressed.  No physical exam was performed (except for noted visual exam findings with Video Visits).    Date:  06/18/2023   ID:  Jessica Banks, DOB 26-Jan-1981, MRN [id number]  Patient Location:  Home  Provider location:   Office    Chief Complaint:  "I am interested in GLP-1 therapy"  History of Present Illness:    Jessica Banks is a 42 y.o. female who presents via video conferencing for a telehealth visit today.    The patient does not have symptoms concerning for COVID-19 infection (fever, chills, cough, or new shortness of breath).   She presents today for virtual visit. She prefers this method of contact due to COVID-19 pandemic.  She presents today for weight loss guidance. She recently was switched to gastric sleeve to gastric bypass in August 2023. She has lost 52 lbs since August 2023. She feels she has hit a plateau. She states she has been at the same weight since the end of April.  he wants to  start GLP-1 agonist therapy. She works out with her trainer 2x/week.        Past Medical History:  Diagnosis Date   Abnormal Pap smear    CIN I   Anemia    Anxiety    Depression    off meds now, ok now   Enlarged thyroid gland    nromal function   GERD (gastroesophageal reflux disease)    Migraine    Obesity    Ovarian cyst    Vaginal Pap smear, abnormal    ok now   Past Surgical History:  Procedure Laterality Date   GASTRIC BYPASS  08/14/2022   Revision of sleeve/hiatal hernia repair   LAPAROSCOPIC GASTRIC SLEEVE RESECTION  2017     Current Meds  Medication Sig   butalbital-acetaminophen-caffeine (FIORICET) 50-325-40 MG tablet Take 1 tablet by mouth every 6 (six) hours as needed.   cholecalciferol (VITAMIN D3) 25 MCG (1000 UNIT) tablet Take by mouth daily.   cyclobenzaprine (FLEXERIL) 10 MG tablet Take 1 tablet (10 mg total) by mouth as needed.   DULoxetine (CYMBALTA) 60 MG capsule Take 1 capsule (60 mg total) by mouth daily.   Galcanezumab-gnlm (EMGALITY) 120 MG/ML SOAJ Inject 1 Pen into the skin every 30 (thirty) days.   Iron-FA-B Cmp-C-Biot-Probiotic (FUSION PLUS) CAPS One capsule po qd   Melatonin 10 MG CAPS Take by mouth.   nystatin powder Apply 1 Application topically 3 (three) times daily. prn   temazepam (RESTORIL) 15 MG capsule TAKE 1 CAPSULE BY MOUTH AT  BEDTIME AS NEEDED FOR SLEEP.     Allergies:   Novocain [procaine] and Povidone iodine   Social History   Tobacco Use   Smoking status: Former    Years: 4    Types: Cigarettes    Quit date: 06/07/2013    Years since quitting: 10.0   Smokeless tobacco: Never   Tobacco comments:    only smoked on the weekends  Vaping Use   Vaping Use: Never used  Substance Use Topics   Alcohol use: Not Currently    Comment: SOCIAL    Drug use: No     Family Hx: The patient's family history includes Arthritis in her mother; COPD in her paternal grandfather; Cancer in her maternal grandfather; Diabetes in her  maternal uncle; Heart disease in her paternal grandmother; Hypertension in her maternal uncle; Leukemia in her maternal grandmother; Stroke in her maternal grandmother; Thyroid disease in her mother.  ROS:   Please see the history of present illness.    Review of Systems  Constitutional: Negative.   Respiratory: Negative.    Cardiovascular: Negative.   Gastrointestinal: Negative.   Neurological: Negative.   Psychiatric/Behavioral: Negative.      All other systems reviewed and are negative.   Labs/Other Tests and Data Reviewed:    Recent Labs: 10/25/2022: TSH 0.53 03/28/2023: ALT 11; BUN 10; Creatinine, Ser 0.62; Hemoglobin 11.3; Platelets 362; Potassium 4.6; Sodium 138   Recent Lipid Panel Lab Results  Component Value Date/Time   CHOL 191 03/28/2023 10:00 AM   TRIG 49 03/28/2023 10:00 AM   HDL 81 03/28/2023 10:00 AM   CHOLHDL 2.4 03/28/2023 10:00 AM   LDLCALC 101 (H) 03/28/2023 10:00 AM    Wt Readings from Last 3 Encounters:  06/18/23 192 lb 9.6 oz (87.4 kg)  03/28/23 195 lb (88.5 kg)  03/16/23 199 lb 9.6 oz (90.5 kg)     Exam:    Vital Signs:  Ht 5' (1.524 m)   Wt 192 lb 9.6 oz (87.4 kg)   BMI 37.61 kg/m     Physical Exam Vitals and nursing note reviewed.  Constitutional:      Appearance: Normal appearance.  HENT:     Head: Normocephalic and atraumatic.  Eyes:     Extraocular Movements: Extraocular movements intact.  Pulmonary:     Effort: Pulmonary effort is normal.  Musculoskeletal:     Cervical back: Normal range of motion.  Neurological:     Mental Status: She is alert and oriented to person, place, and time.  Psychiatric:        Mood and Affect: Affect normal.     ASSESSMENT & PLAN:    Class 2 obesity due to excess calories with body mass index (BMI) of 37.0 to 37.9 in adult, unspecified whether serious comorbidity present Assessment & Plan: We discussed the use of Wegovy for obesity. She denies family/personal h/o thyroid cancer. She was  advised to see if her insurance covers [place] or Zepbound. I will start PA for [facility]. If approved, she will come to get [facility] sample. She is advised to stop eating when full,  She will rto in 4 weeks for re-evaluation once she starts the medication.  Possible side effects d/w patient. All questions were answered to her satisfaction.        COVID-19 Education: The signs and symptoms of COVID-19 were discussed with the patient and how to seek care for testing (follow up with PCP or arrange E-visit).  The importance of social distancing was discussed  today.  Patient Risk:   After full review of this patients clinical status, I feel that they are at least moderate risk at this time.  Time:   Today, I have spent 19 minutes/ seconds with the patient with telehealth technology discussing above diagnoses.     Medication Adjustments/Labs and Tests Ordered: Current medicines are reviewed at length with the patient today.  Concerns regarding medicines are outlined above.   Tests Ordered: No orders of the defined types were placed in this encounter.   Medication Changes: No orders of the defined types were placed in this encounter.   Disposition:  Follow up prn  Signed, Gwynneth Aliment, MD

## 2023-06-18 NOTE — Assessment & Plan Note (Addendum)
We discussed the use of Wegovy for obesity. She denies family/personal h/o thyroid cancer. She was advised to see if her insurance covers [place] or Zepbound. I will start PA for [facility]. If approved, she will come to get [facility] sample. She is advised to stop eating when full,  She will rto in 4 weeks for re-evaluation once she starts the medication.  Possible side effects d/w patient. All questions were answered to her satisfaction.

## 2023-06-18 NOTE — Patient Instructions (Signed)

## 2023-06-20 ENCOUNTER — Telehealth: Payer: Self-pay

## 2023-06-20 ENCOUNTER — Other Ambulatory Visit (HOSPITAL_COMMUNITY): Payer: Self-pay

## 2023-06-20 NOTE — Telephone Encounter (Signed)
Pharmacy Patient Advocate Encounter   Received notification from [facility] that prior authorization for Emgality 120MG /ML auto-injectors is required/requested.   PA submitted to [facility] via CoverMyMeds Key or (Medicaid) confirmation #  [id number] Status is pending

## 2023-06-25 NOTE — Telephone Encounter (Signed)
Pharmacy Patient Advocate Encounter  Prior Authorization for Emgality 120MG /ML auto-injectors (migraine) has been APPROVED by [facility] from 06/22/2023 to 06/19/2024.   PA # [id number]

## 2023-06-28 ENCOUNTER — Other Ambulatory Visit: Payer: Self-pay | Admitting: Internal Medicine

## 2023-06-28 ENCOUNTER — Encounter: Payer: Self-pay | Admitting: Internal Medicine

## 2023-06-28 ENCOUNTER — Other Ambulatory Visit: Payer: Self-pay

## 2023-06-28 MED ORDER — WEGOVY 0.25 MG/0.5ML ~~LOC~~ SOAJ: 0.2500 mg | SUBCUTANEOUS | 0 refills | Status: DC

## 2023-07-17 ENCOUNTER — Other Ambulatory Visit: Payer: Self-pay | Admitting: Internal Medicine

## 2023-07-18 ENCOUNTER — Encounter: Payer: Self-pay | Admitting: Internal Medicine

## 2023-07-19 ENCOUNTER — Other Ambulatory Visit: Payer: Self-pay | Admitting: Internal Medicine

## 2023-07-19 ENCOUNTER — Encounter: Payer: Self-pay | Admitting: Internal Medicine

## 2023-07-19 MED ORDER — TEMAZEPAM 15 MG PO CAPS: ORAL_CAPSULE | ORAL | 2 refills | Status: DC

## 2023-07-25 ENCOUNTER — Telehealth: Payer: Self-pay | Admitting: Psychiatry

## 2023-07-25 NOTE — Telephone Encounter (Signed)
LVM and sent mychart msg informing pt of need to reschedule 11/14/23 appt - MD leaving practice

## 2023-08-01 DIAGNOSIS — Z79899 Other long term (current) drug therapy: Secondary | ICD-10-CM | POA: Diagnosis not present

## 2023-08-01 DIAGNOSIS — F902 Attention-deficit hyperactivity disorder, combined type: Secondary | ICD-10-CM | POA: Diagnosis not present

## 2023-08-02 DIAGNOSIS — Z79899 Other long term (current) drug therapy: Secondary | ICD-10-CM | POA: Diagnosis not present

## 2023-08-02 DIAGNOSIS — R41840 Attention and concentration deficit: Secondary | ICD-10-CM | POA: Diagnosis not present

## 2023-08-29 DIAGNOSIS — Z79899 Other long term (current) drug therapy: Secondary | ICD-10-CM | POA: Diagnosis not present

## 2023-08-29 DIAGNOSIS — F902 Attention-deficit hyperactivity disorder, combined type: Secondary | ICD-10-CM | POA: Diagnosis not present

## 2023-09-20 ENCOUNTER — Other Ambulatory Visit: Payer: Self-pay | Admitting: Diagnostic Neuroimaging

## 2023-09-21 ENCOUNTER — Encounter: Payer: Self-pay | Admitting: Physician Assistant

## 2023-09-21 ENCOUNTER — Other Ambulatory Visit: Payer: Self-pay | Admitting: Physician Assistant

## 2023-09-21 DIAGNOSIS — R1013 Epigastric pain: Secondary | ICD-10-CM

## 2023-09-21 DIAGNOSIS — Z9884 Bariatric surgery status: Secondary | ICD-10-CM | POA: Diagnosis not present

## 2023-09-21 DIAGNOSIS — K2100 Gastro-esophageal reflux disease with esophagitis, without bleeding: Secondary | ICD-10-CM | POA: Diagnosis not present

## 2023-09-21 DIAGNOSIS — R748 Abnormal levels of other serum enzymes: Secondary | ICD-10-CM | POA: Diagnosis not present

## 2023-09-23 DIAGNOSIS — R5383 Other fatigue: Secondary | ICD-10-CM | POA: Diagnosis not present

## 2023-09-23 LAB — BASIC METABOLIC PANEL
BUN: 9 (ref 4–21)
CO2: 21 (ref 13–22)
Chloride: 102 (ref 99–108)
Creatinine: 0.8 (ref 0.5–1.1)
Glucose: 108
Potassium: 3.8 meq/L (ref 3.5–5.1)
Sodium: 138 (ref 137–147)

## 2023-09-23 LAB — VITAMIN D 25 HYDROXY (VIT D DEFICIENCY, FRACTURES): Vit D, 25-Hydroxy: 37.5

## 2023-09-23 LAB — LIPID PANEL
Cholesterol: 204 — AB (ref 0–200)
HDL: 89 — AB (ref 35–70)
LDL Cholesterol: 107
Triglycerides: 41 (ref 40–160)

## 2023-09-23 LAB — VITAMIN B12: Vitamin B-12: 782

## 2023-09-23 LAB — COMPREHENSIVE METABOLIC PANEL
Albumin: 4.6 (ref 3.5–5.0)
Globulin: 2.9
eGFR: 101

## 2023-09-23 LAB — IRON,TIBC AND FERRITIN PANEL
Ferritin: 28
Iron: 55
TIBC: 328
UIBC: 273

## 2023-09-23 LAB — CBC AND DIFFERENTIAL
HCT: 42 (ref 36–46)
Hemoglobin: 13.4 (ref 12.0–16.0)
Platelets: 366 10*3/uL (ref 150–400)
WBC: 4.7

## 2023-09-23 LAB — HEPATIC FUNCTION PANEL
ALT: 11 U/L (ref 7–35)
AST: 24 (ref 13–35)
Alkaline Phosphatase: 210 — AB (ref 25–125)

## 2023-09-23 LAB — HEMOGLOBIN A1C: Hemoglobin A1C: 5

## 2023-09-23 LAB — CBC: RBC: 4.6 (ref 3.87–5.11)

## 2023-09-28 ENCOUNTER — Ambulatory Visit
Admission: RE | Admit: 2023-09-28 | Discharge: 2023-09-28 | Disposition: A | Discharge status: Home / Self Care | Payer: BC Managed Care – PPO | Source: Ambulatory Visit | Attending: Physician Assistant | Admitting: Physician Assistant

## 2023-09-28 DIAGNOSIS — R1013 Epigastric pain: Secondary | ICD-10-CM

## 2023-09-28 DIAGNOSIS — R748 Abnormal levels of other serum enzymes: Secondary | ICD-10-CM

## 2023-09-28 DIAGNOSIS — K828 Other specified diseases of gallbladder: Secondary | ICD-10-CM | POA: Diagnosis not present

## 2023-10-16 ENCOUNTER — Other Ambulatory Visit: Payer: Self-pay | Admitting: Internal Medicine

## 2023-10-23 DIAGNOSIS — R300 Dysuria: Secondary | ICD-10-CM | POA: Diagnosis not present

## 2023-10-24 DIAGNOSIS — Z9884 Bariatric surgery status: Secondary | ICD-10-CM | POA: Diagnosis not present

## 2023-10-24 DIAGNOSIS — K2100 Gastro-esophageal reflux disease with esophagitis, without bleeding: Secondary | ICD-10-CM | POA: Diagnosis not present

## 2023-10-24 DIAGNOSIS — K838 Other specified diseases of biliary tract: Secondary | ICD-10-CM | POA: Diagnosis not present

## 2023-10-29 DIAGNOSIS — Z79899 Other long term (current) drug therapy: Secondary | ICD-10-CM | POA: Diagnosis not present

## 2023-10-29 DIAGNOSIS — F902 Attention-deficit hyperactivity disorder, combined type: Secondary | ICD-10-CM | POA: Diagnosis not present

## 2023-11-01 ENCOUNTER — Other Ambulatory Visit (HOSPITAL_BASED_OUTPATIENT_CLINIC_OR_DEPARTMENT_OTHER): Payer: Self-pay | Admitting: Internal Medicine

## 2023-11-01 DIAGNOSIS — Z139 Encounter for screening, unspecified: Secondary | ICD-10-CM

## 2023-11-06 DIAGNOSIS — G5601 Carpal tunnel syndrome, right upper limb: Secondary | ICD-10-CM | POA: Diagnosis not present

## 2023-11-07 DIAGNOSIS — F902 Attention-deficit hyperactivity disorder, combined type: Secondary | ICD-10-CM | POA: Diagnosis not present

## 2023-11-14 ENCOUNTER — Ambulatory Visit: Payer: BC Managed Care – PPO | Admitting: Psychiatry

## 2023-11-19 ENCOUNTER — Other Ambulatory Visit: Payer: Self-pay | Admitting: Diagnostic Neuroimaging

## 2023-11-19 ENCOUNTER — Ambulatory Visit: Payer: BC Managed Care – PPO | Admitting: Diagnostic Neuroimaging

## 2023-11-19 ENCOUNTER — Encounter: Payer: Self-pay | Admitting: Diagnostic Neuroimaging

## 2023-11-19 VITALS — BP 123/77 | HR 76 | Ht 60.0 in | Wt 189.0 lb

## 2023-11-19 DIAGNOSIS — G43109 Migraine with aura, not intractable, without status migrainosus: Secondary | ICD-10-CM | POA: Diagnosis not present

## 2023-11-19 MED ORDER — CYCLOBENZAPRINE HCL 10 MG PO TABS: 10.0000 mg | ORAL_TABLET | Freq: Every day | ORAL | 1 refills | Status: DC | PRN

## 2023-11-19 MED ORDER — EMGALITY 120 MG/ML ~~LOC~~ SOAJ: 1.0000 | SUBCUTANEOUS | 6 refills | Status: DC

## 2023-11-19 MED ORDER — DULOXETINE HCL 60 MG PO CPEP: 60.0000 mg | ORAL_CAPSULE | Freq: Every day | ORAL | 4 refills | Status: DC

## 2023-11-19 MED ORDER — BUTALBITAL-APAP-CAFFEINE 50-325-40 MG PO TABS: 1.0000 | ORAL_TABLET | Freq: Two times a day (BID) | ORAL | 0 refills | Status: DC | PRN

## 2023-11-19 MED ORDER — NURTEC 75 MG PO TBDP: 75.0000 mg | ORAL_TABLET | Freq: Every day | ORAL | 6 refills | Status: DC | PRN

## 2023-11-19 NOTE — Patient Instructions (Signed)
MIGRAINE PREVENTION  LIFESTYLE CHANGES -Stop or avoid smoking -Decrease or avoid caffeine / alcohol -Eat and sleep on a regular schedule -Exercise several times per week - continue galazanezumab (Emgality) 120mg  monthly - continue duloxetine 60mg  daily   MIGRAINE RESCUE  - ibuprofen, tylenol as needed - cyclobenzaprine as needed - fioricet as needed (last resort; started by PCP) - eletriptan (Relpax) 40mg  as needed for breakthrough headache; may repeat x 1 after 2 hours; max 2 tabs per day or 8 per month - rimegepant (Nurtec) 75mg  as needed for breakthrough headache; max 8 per month - next steps: consider Botox, alternate CGRP (ajovy, qulipta)

## 2023-11-19 NOTE — Progress Notes (Signed)
[facility]  PATIENT: Jessica Banks DOB: July 21, 1981  REFERRING CLINICIAN: Dorothyann Peng, MD HISTORY FROM: patient  REASON FOR VISIT: follow up   HISTORICAL  CHIEF COMPLAINT:  Chief Complaint  Patient presents with   Follow-up    Pt alone, rm 6. She states overall the frequency of migraines is better on emgality. When she does have a migraine the intensity is still as bad. She tried the naratriptan and it didn't help. She has tried the triptan's in the past. She has tried nurtec and [place]. Abortive therapy fiorcet and that seems to be the best one that helps.    HISTORY OF PRESENT ILLNESS:   UPDATE (11/19/23, VRP): Since last visit, doing better with HA; now 3-4 HA per month on emgality. Tried nurtec with mild benefit. Uses fioricet as back up rescue. Sometimes has visual aura.   PRIOR HPI (03/16/23, VRP): The patient presents for evaluation of headaches which began around age 45. She did have a normal MRI as a child. Had a period of time between adolescence and college where she did not have migraines. They then started to return after college. Headaches are associated with photophobia, phonophobia, and osmophobia. She currently averages 12 migraine days per month.   Previous took Nurtec which was helpful, but had to stop this during pregnancy and has not had a chance to restart it. Took Fioricet during pregnancy which was helpful. Has also taken Flexeril as needed for neck spasms. She has tried multiple preventives which she has failed due to lack of efficacy.   Headache History: Onset: age 20 Aura: no Associated Symptoms:             Photophobia: yes             Phonophobia: yes Worse with activity?: yes Duration of headaches: 2 days   Migraine days per month: 12 Headache free days per month: 18   Current Treatment: Abortive Fioricet Flexeril   Preventative none   Prior Therapies                                  Rescue: Toradol Fioricet Flexeril Imitrex Maxalt 10 mg PRN Zomig spray Nurtec - helpful Ubrelvy - lack of efficacy   Preventive: Propranolol - lightheadedness Topamax - paresthesias Zonisamide Celexa Sertraline Aimovig - lack of efficacy Trigger point injections    REVIEW OF SYSTEMS: Full 14 system review of systems performed and negative with exception of: as per HPI/.  ALLERGIES: Allergies  Allergen Reactions   Novocain [Procaine] Anaphylaxis   Povidone Iodine Rash    Rash    HOME MEDICATIONS: Outpatient Medications Prior to Visit  Medication Sig Dispense Refill   amphetamine-dextroamphetamine (ADDERALL XR) 30 MG 24 hr capsule Take 30 mg by mouth daily.     Melatonin 10 MG CAPS Take by mouth.     Multiple Vitamin (MULTIVITAMIN) tablet Take 1 tablet by mouth daily.     nystatin powder Apply 1 Application topically 3 (three) times daily. prn 60 g 3   Probiotic Product (FORTIFY PROBIOTIC WOMENS PO) Take by mouth.     temazepam (RESTORIL) 15 MG capsule TAKE 1 CAPSULE BY MOUTH AT BEDTIME AS NEEDED FOR SLEEP 30 capsule 2   ursodiol (ACTIGALL) 300 MG capsule Take 300 mg by mouth 2 (two) times daily.     VITAMIN D PO Take 10 mcg by mouth daily.     butalbital-acetaminophen-caffeine (FIORICET) [phone]  MG tablet Take 1 tablet by mouth every 6 (six) hours as needed. 30 tablet 0   cyclobenzaprine (FLEXERIL) 10 MG tablet Take 1 tablet (10 mg total) by mouth as needed. 30 tablet 6   DULoxetine (CYMBALTA) 60 MG capsule Take 1 capsule (60 mg total) by mouth daily. 30 capsule 4   Galcanezumab-gnlm (EMGALITY) 120 MG/ML SOAJ Inject 1 Pen into the skin every 30 (thirty) days. 1.12 mL 6   Iron-FA-B Cmp-C-Biot-Probiotic (FUSION PLUS) CAPS One capsule po qd 30 capsule 1   naratriptan (AMERGE) 2.5 MG tablet Take 1 tablet (2.5 mg total) by mouth as needed. Take one (1) tablet at onset of headache; if returns or does not resolve, may repeat after 4 hours; do not exceed five (5) mg in 24  hours. (Patient not taking: Reported on 06/18/2023) 10 tablet 6   Semaglutide-Weight Management (WEGOVY) 0.25 MG/0.5ML SOAJ Inject 0.25 mg into the skin once a week. 2 mL 0   No facility-administered medications prior to visit.      PHYSICAL EXAM  GENERAL EXAM/CONSTITUTIONAL: Vitals:  Vitals:   11/19/23 1509  BP: 123/77  Pulse: 76  Weight: 189 lb (85.7 kg)  Height: 5' (1.524 m)   Body mass index is 36.91 kg/m. Wt Readings from Last 3 Encounters:  11/19/23 189 lb (85.7 kg)  06/18/23 192 lb 9.6 oz (87.4 kg)  03/28/23 195 lb (88.5 kg)   Patient is in no distress; well developed, nourished and groomed; neck is supple  CARDIOVASCULAR: Examination of carotid arteries is normal; no carotid bruits Regular rate and rhythm, no murmurs Examination of peripheral vascular system by observation and palpation is normal  EYES: Ophthalmoscopic exam of optic discs and posterior segments is normal; no papilledema or hemorrhages No results found.  MUSCULOSKELETAL: Gait, strength, tone, movements noted in Neurologic exam below  NEUROLOGIC: MENTAL STATUS:      No data to display         awake, alert, oriented to person, place and time recent and remote memory intact normal attention and concentration language fluent, comprehension intact, naming intact fund of knowledge appropriate  CRANIAL NERVE:  2nd - no papilledema on fundoscopic exam 2nd, 3rd, 4th, 6th - pupils equal and reactive to light, visual fields full to confrontation, extraocular muscles intact, no nystagmus 5th - facial sensation symmetric 7th - facial strength symmetric 8th - hearing intact 9th - palate elevates symmetrically, uvula midline 11th - shoulder shrug symmetric 12th - tongue protrusion midline  MOTOR:  normal bulk and tone, full strength in the BUE, BLE  SENSORY:  normal and symmetric to light touch, temperature, vibration  COORDINATION:  finger-nose-finger, fine finger movements  normal  REFLEXES:  deep tendon reflexes TRACE and symmetric  GAIT/STATION:  narrow based gait     DIAGNOSTIC DATA (LABS, IMAGING, TESTING) - I reviewed patient records, labs, notes, testing and imaging myself where available.  Lab Results  Component Value Date   WBC 5.0 03/28/2023   HGB 11.3 03/28/2023   HCT 36.8 03/28/2023   MCV 85 03/28/2023   PLT 362 03/28/2023      Component Value Date/Time   NA 138 03/28/2023 1000   K 4.6 03/28/2023 1000   CL 102 03/28/2023 1000   CO2 24 03/28/2023 1000   GLUCOSE 91 03/28/2023 1000   GLUCOSE 113 (H) 01/30/2022 1648   BUN 10 03/28/2023 1000   CREATININE 0.62 03/28/2023 1000   CREATININE 0.60 04/06/2016 1700   CALCIUM 9.8 03/28/2023 1000   PROT 6.9 03/28/2023  1000   ALBUMIN 4.2 03/28/2023 1000   AST 19 03/28/2023 1000   ALT 11 03/28/2023 1000   ALKPHOS 205 (H) 03/28/2023 1000   BILITOT <0.2 03/28/2023 1000   GFRNONAA >60 01/30/2022 1648   GFRNONAA >89 04/06/2016 1700   GFRAA >60 04/23/2019 0105   GFRAA >89 04/06/2016 1700   Lab Results  Component Value Date   CHOL 191 03/28/2023   HDL 81 03/28/2023   LDLCALC 101 (H) 03/28/2023   TRIG 49 03/28/2023   CHOLHDL 2.4 03/28/2023   Lab Results  Component Value Date   HGBA1C 5.7 02/06/2023   Lab Results  Component Value Date   VITAMINB12 331 02/06/2023   Lab Results  Component Value Date   TSH 0.53 10/25/2022       ASSESSMENT AND PLAN  42 y.o. year old female here with history of gastric bypass, iron deficiency who presents for evaluation of migraines.   Meds tried:                                Rescue: Toradol Fioricet Flexeril Imitrex Maxalt 10 mg PRN Zomig spray Nurtec - helpful Ubrelvy - lack of efficacy   Preventive: Propranolol - lightheadedness Topamax - paresthesias Zonisamide Celexa Sertraline Aimovig - lack of efficacy Trigger point injections  Dx:  1. Migraine with aura and without status migrainosus, not intractable      PLAN:  MIGRAINE WITH AURA TREATMENT PLAN:  MIGRAINE PREVENTION  LIFESTYLE CHANGES -Stop or avoid smoking -Decrease or avoid caffeine / alcohol -Eat and sleep on a regular schedule -Exercise several times per week - continue galazanezumab (Emgality) 120mg  monthly - continue duloxetine 60mg  daily   MIGRAINE RESCUE  - ibuprofen, tylenol as needed - cyclobenzaprine as needed - fioricet as needed (last resort; started by PCP) - eletriptan (Relpax) 40mg  as needed for breakthrough headache; may repeat x 1 after 2 hours; max 2 tabs per day or 8 per month - rimegepant (Nurtec) 75mg  as needed for breakthrough headache; max 8 per month - next steps: consider Botox, alternate CGRP (ajovy, qulipta)  Meds ordered this encounter  Medications   Rimegepant Sulfate (NURTEC) 75 MG TBDP    Sig: Take 1 tablet (75 mg total) by mouth daily as needed.    Dispense:  8 tablet    Refill:  6   Galcanezumab-gnlm (EMGALITY) 120 MG/ML SOAJ    Sig: Inject 1 Pen into the skin every 30 (thirty) days.    Dispense:  1.12 mL    Refill:  6   butalbital-acetaminophen-caffeine (FIORICET) 50-325-40 MG tablet    Sig: Take 1 tablet by mouth 2 (two) times daily as needed.    Dispense:  30 tablet    Refill:  0   cyclobenzaprine (FLEXERIL) 10 MG tablet    Sig: Take 1 tablet (10 mg total) by mouth daily as needed.    Dispense:  30 tablet    Refill:  1   DULoxetine (CYMBALTA) 60 MG capsule    Sig: Take 1 capsule (60 mg total) by mouth daily.    Dispense:  90 capsule    Refill:  4   Return in about 6 months (around 05/19/2024) for MyChart visit (15 min).    Suanne Marker, MD 11/19/2023, 4:21 PM Certified in Neurology, Neurophysiology and Neuroimaging  [facility] [address], [address] [place], [place] [address] [phone]

## 2023-11-20 ENCOUNTER — Ambulatory Visit: Payer: BC Managed Care – PPO | Admitting: Internal Medicine

## 2023-11-20 ENCOUNTER — Encounter: Payer: Self-pay | Admitting: Internal Medicine

## 2023-11-20 VITALS — BP 124/80 | HR 100 | Temp 98.3°F | Ht 60.0 in | Wt 187.0 lb

## 2023-11-20 DIAGNOSIS — R748 Abnormal levels of other serum enzymes: Secondary | ICD-10-CM

## 2023-11-20 DIAGNOSIS — E162 Hypoglycemia, unspecified: Secondary | ICD-10-CM

## 2023-11-20 DIAGNOSIS — R030 Elevated blood-pressure reading, without diagnosis of hypertension: Secondary | ICD-10-CM | POA: Diagnosis not present

## 2023-11-20 DIAGNOSIS — Z800 Family history of malignant neoplasm of digestive organs: Secondary | ICD-10-CM

## 2023-11-20 DIAGNOSIS — E66812 Obesity, class 2: Secondary | ICD-10-CM

## 2023-11-20 DIAGNOSIS — Z6836 Body mass index (BMI) 36.0-36.9, adult: Secondary | ICD-10-CM

## 2023-11-20 NOTE — Progress Notes (Signed)
I,Victoria T Deloria Lair, CMA,acting as a Neurosurgeon for Gwynneth Aliment, MD.,have documented all relevant documentation on the behalf of Gwynneth Aliment, MD,as directed by  Gwynneth Aliment, MD while in the presence of Gwynneth Aliment, MD.  Subjective:  Patient ID: Jessica Banks , female    DOB: Aug 28, 1981 , 42 y.o.   MRN: [id number]  Chief Complaint  Patient presents with   Hypoglycemia    HPI  Patient presents today for further evaluation of low blood sugar. She reports having an episode with symptoms of diaphoresis, shakiness and weakness. She states when she doesn't eat she feels shaky as well. She states it awakened her up out of her sleep. Sunday night she woke up in the middle of the night completely drenched. She then tried to get out of bed, but felt lightheaded.  Of note, she is s/p gastric sleeve performed in August 2023. She admits she did NOT have similar sx prior to this surgery. She does admit she has had similar sx in the past; however, she acknowledge that she likely skipped meals during these episodes. She has been more intentional about her meal planning and eats something every four hours.   Over the past 48 hours, she has been using  her husbands Dexcom.  She also has called Endo to make an appointment; however, they do not have anything available until Feb.      Past Medical History:  Diagnosis Date   Abnormal Pap smear    CIN I   Anemia    Anxiety    Depression    off meds now, ok now   Enlarged thyroid gland    nromal function   GERD (gastroesophageal reflux disease)    Migraine    Obesity    Ovarian cyst    Vaginal Pap smear, abnormal    ok now     Family History  Problem Relation Age of Onset   Thyroid disease Mother        s/p thyroidectomy   Arthritis Mother    Leukemia Maternal Grandmother    Stroke Maternal Grandmother    Cancer Maternal Grandfather        lung   Heart disease Paternal Grandmother    COPD Paternal Grandfather    Hypertension  Maternal Uncle    Diabetes Maternal Uncle      Current Outpatient Medications:    amphetamine-dextroamphetamine (ADDERALL XR) 30 MG 24 hr capsule, Take 30 mg by mouth daily., Disp: , Rfl:    butalbital-acetaminophen-caffeine (FIORICET) 50-325-40 MG tablet, Take 1 tablet by mouth 2 (two) times daily as needed., Disp: 30 tablet, Rfl: 0   cyclobenzaprine (FLEXERIL) 10 MG tablet, Take 1 tablet (10 mg total) by mouth daily as needed., Disp: 30 tablet, Rfl: 1   DULoxetine (CYMBALTA) 60 MG capsule, Take 1 capsule (60 mg total) by mouth daily., Disp: 90 capsule, Rfl: 4   Galcanezumab-gnlm (EMGALITY) 120 MG/ML SOAJ, Inject 1 Pen into the skin every 30 (thirty) days., Disp: 1.12 mL, Rfl: 6   Melatonin 10 MG CAPS, Take by mouth., Disp: , Rfl:    Multiple Vitamin (MULTIVITAMIN) tablet, Take 1 tablet by mouth daily., Disp: , Rfl:    nystatin powder, Apply 1 Application topically 3 (three) times daily. prn, Disp: 60 g, Rfl: 3   Probiotic Product (FORTIFY PROBIOTIC WOMENS PO), Take by mouth., Disp: , Rfl:    Rimegepant Sulfate (NURTEC) 75 MG TBDP, Take 1 tablet (75 mg total) by mouth daily as needed.,  Disp: 8 tablet, Rfl: 6   temazepam (RESTORIL) 15 MG capsule, TAKE 1 CAPSULE BY MOUTH AT BEDTIME AS NEEDED FOR SLEEP, Disp: 30 capsule, Rfl: 2   ursodiol (ACTIGALL) 300 MG capsule, Take 300 mg by mouth 2 (two) times daily., Disp: , Rfl:    VITAMIN D PO, Take 10 mcg by mouth daily., Disp: , Rfl:    Allergies  Allergen Reactions   Novocain [Procaine] Anaphylaxis   Povidone Iodine Rash    Rash     Review of Systems  Constitutional: Negative.   Respiratory: Negative.    Cardiovascular: Negative.   Neurological: Negative.   Psychiatric/Behavioral: Negative.       Today's Vitals   11/20/23 1054 11/20/23 1219  BP: (!) 142/88 124/80  Pulse: 100   Temp: 98.3 F (36.8 C)   SpO2: 98%   Weight: 187 lb (84.8 kg)   Height: 5' (1.524 m)    Body mass index is 36.52 kg/m.  Wt Readings from Last 3  Encounters:  11/20/23 187 lb (84.8 kg)  11/19/23 189 lb (85.7 kg)  06/18/23 192 lb 9.6 oz (87.4 kg)     Objective:  Physical Exam Vitals and nursing note reviewed.  Constitutional:      Appearance: Normal appearance. She is obese.  HENT:     Head: Normocephalic and atraumatic.  Eyes:     Extraocular Movements: Extraocular movements intact.  Cardiovascular:     Rate and Rhythm: Normal rate and regular rhythm.     Heart sounds: Normal heart sounds.  Pulmonary:     Effort: Pulmonary effort is normal.     Breath sounds: Normal breath sounds.  Abdominal:     General: Bowel sounds are normal.     Palpations: Abdomen is soft.  Musculoskeletal:     Cervical back: Normal range of motion.  Skin:    General: Skin is warm.  Neurological:     General: No focal deficit present.     Mental Status: She is alert.  Psychiatric:        Mood and Affect: Mood normal.        Behavior: Behavior normal.         Assessment And Plan:  Hypoglycemia Assessment & Plan: I reviewed her Dexcom sensor results with many glucose readings below 60. It is difficult to determine if this is due to dumping syndrome or a pancreatic issue. I will check labs as below. She is concerned about symptoms happening in the middle of the night, she was given samples of both Dexcom G7 and Freestyle Libre sensors. She will check with pharmacy to see which one is the least expensive. I will also send prescription to see if this can be approved through her insurance despite not having diabetes. She is tearful because she had two half-sisters pass from pancreatic cancer, they do not share the same mother. We discussed that imaging will likely be needed to visualize the pancreas, she is in agreement with treatment plan.   Orders: -     Insulin and C-Peptide -     Insulin-like growth factor  Elevated alkaline phosphatase level Assessment & Plan: She recently had RUQ ultrasound, hepatic steatosis is noted. She had recent  isoenzymes evaluation by bariatric team, reports this is normal. Agrees to forward her lab results.    Elevated blood pressure reading Assessment & Plan: Repeat blood pressure was improved. Likely exacerbated by her health concerns. No need for treatment at this time.    Class 2 severe  obesity due to excess calories with serious comorbidity and body mass index (BMI) of 36.0 to 36.9 in adult [facility]) Assessment & Plan: She is s/p gastric sleeve August 2023. She was prescribed phentermine by Bariatric provider, advised to stop taking meds until hypoglycemia workup is complete. She has lost five pounds since July. Encouraged to continue with her regular exercise regimen.    Family history of pancreatic cancer  She is encouraged to strive for BMI less than 30 to decrease cardiac risk. Advised to aim for at least 150 minutes of exercise per week.   Return if symptoms worsen or fail to improve.  Patient was given opportunity to ask questions. Patient verbalized understanding of the plan and was able to repeat key elements of the plan. All questions were answered to their satisfaction.    I, Gwynneth Aliment, MD, have reviewed all documentation for this visit. The documentation on 11/20/23 for the exam, diagnosis, procedures, and orders are all accurate and complete.   IF YOU HAVE BEEN REFERRED TO A SPECIALIST, IT MAY TAKE 1-2 WEEKS TO SCHEDULE/PROCESS THE REFERRAL. IF YOU HAVE NOT HEARD FROM US/SPECIALIST IN TWO WEEKS, PLEASE GIVE [place] A CALL AT [phone].   THE PATIENT IS ENCOURAGED TO PRACTICE SOCIAL DISTANCING DUE TO THE COVID-19 PANDEMIC.

## 2023-11-20 NOTE — Assessment & Plan Note (Signed)
She is s/p gastric sleeve August 2023. She was prescribed phentermine by Bariatric provider, advised to stop taking meds until hypoglycemia workup is complete. She has lost five pounds since July. Encouraged to continue with her regular exercise regimen.

## 2023-11-20 NOTE — Patient Instructions (Signed)
Blood Glucose Monitoring, Adult To manage your diabetes, you will need to keep track of your blood sugar (glucose). Check your blood glucose as often as told. Keep a record of your results over time. This can help you: Know when to adjust your diabetes management plan with your health care provider. See how food, exercise, illness, and medicines affect your blood glucose. Know what your blood glucose is at any time. Your provider will set specific goals for your blood glucose levels. In many cases, these goals may be: Before meals (preprandial): 80-130 mg/dL (1.6-1.0 mmol/L). After meals (postprandial): below 180 mg/dL (10 mmol/L). A1C level: less than 7%. Supplies needed: Blood glucose meter. Test strips for your meter. Each meter has its own strips. You must use the strips that came with your meter. A needle to prick your finger (lancet). Do not use a lancet more than once. A device that holds the lancet (lancing device). A journal or logbook to write down your results. How to check your blood glucose Checking your blood glucose  Wash your hands with soap and water for at least 20 seconds. Prick the side of your finger with the lancet. Do not prick the tip of your finger. Do not use the same finger more than once. Gently rub the finger until a small drop of blood appears. Follow the instructions that came with the meter about how to insert the test strip, apply blood to the strip, and use the meter. Write down your result and any notes. Using alternative sites Some meters let you use other areas of your body (alternative sites) to test your blood. The most common places are the forearm, the thigh, and the palm of your hand. Alternative sites may not be as accurate as your fingers. The result you get may also be delayed. Use the finger only, and do not use alternative sites, if: You think you have low blood glucose (hypoglycemia). You sometimes do not know that your blood glucose is  getting low (hypoglycemia unawareness). General tips and recommendations Blood glucose log  Write down the result each time you check your blood glucose. Note anything that may be affecting your blood glucose. This can help you and your provider: Look for patterns over time. Adjust your management plan as needed. Check if your meter has an app or lets you download your records to a computer. Most meters keep a record of glucose readings in the meter. If you have type 1 diabetes: You may need to check your blood glucose 4 or more times a day. Check your blood glucose as often as told by your provider. This may include: Before each meal and snack. Two hours after a meal. Before bedtime. If you have symptoms of hypoglycemia. After treating your hypoglycemia. Before doing things that have a risk of injury, such as driving or using machinery. Before and after exercise. Between 2:00 a.m. and 3:00 a.m., as told. You may need to check your blood glucose more often, such as up to 6-10 times a day, if: You have diabetes that is not well controlled. You are ill. You have a history of severe hypoglycemia. You have hypoglycemia unawareness. If you have type 2 diabetes: You may need to check your blood glucose 2 or more times a day. Check your blood glucose as often as told by your provider. This may include: Before and after exercise. Before doing things that have a risk of injury, such as driving or using machinery. You may need to check  your blood glucose more often if: Your medicine is being adjusted. Your diabetes is not well controlled. You are ill. General tips Make sure you always have your supplies with you. After you use a few boxes of test strips, adjust (calibrate) your blood glucose meter. Follow the instructions that came with your meter. If you have questions or need help, all blood glucose meters have a 24-hour hotline phone number that you can call. Also contact your provider  with any questions or concerns. Where to find more information The American Diabetes Association: diabetes.org The Association of Diabetes Care & Education Specialists: diabeteseducator.org Contact a health care provider if: Your blood glucose is at or above 240 mg/dL (16.1 mmol/L) for 2 days in a row. You have been sick or have had a fever for 2 days or longer and are not getting better. You have any of these problems for more than 6 hours: You cannot eat or drink. You have nausea or vomiting. You have diarrhea. Get help right away if: Your blood glucose is lower than 54 mg/dL (3 mmol/L). You become confused, or you have trouble thinking clearly. You have trouble breathing. You have moderate to high ketone levels in your pee (urine). These symptoms may be an emergency. Get help right away. Call 911. Do not wait to see if the symptoms will go away. Do not drive yourself to the hospital. This information is not intended to replace advice given to you by your health care provider. Make sure you discuss any questions you have with your health care provider. Document Revised: 10/20/2022 Document Reviewed: 10/20/2022 Elsevier Patient Education  2024 [employer].

## 2023-11-20 NOTE — Assessment & Plan Note (Addendum)
She recently had RUQ ultrasound, hepatic steatosis is noted. She had recent isoenzymes evaluation by bariatric team, reports this is normal. Agrees to forward her lab results.

## 2023-11-20 NOTE — Assessment & Plan Note (Signed)
Repeat blood pressure was improved. Likely exacerbated by her health concerns. No need for treatment at this time.

## 2023-11-20 NOTE — Assessment & Plan Note (Addendum)
I reviewed her Dexcom sensor results with many glucose readings below 60. It is difficult to determine if this is due to dumping syndrome or a pancreatic issue. I will check labs as below. She is concerned about symptoms happening in the middle of the night, she was given samples of both Dexcom G7 and Freestyle Libre sensors. She will check with pharmacy to see which one is the least expensive. I will also send prescription to see if this can be approved through her insurance despite not having diabetes. She is tearful because she had two half-sisters pass from pancreatic cancer, they do not share the same mother. We discussed that imaging will likely be needed to visualize the pancreas, she is in agreement with treatment plan.

## 2023-11-21 ENCOUNTER — Telehealth: Payer: Self-pay

## 2023-11-21 ENCOUNTER — Other Ambulatory Visit (HOSPITAL_COMMUNITY): Payer: Self-pay

## 2023-11-21 LAB — INSULIN AND C-PEPTIDE, SERUM
C-Peptide: 2.1 ng/mL (ref 1.1–4.4)
INSULIN: 11 u[IU]/mL (ref 2.6–24.9)

## 2023-11-21 LAB — INSULIN-LIKE GROWTH FACTOR: Insulin-Like GF-1: 142 ng/mL (ref 74–239)

## 2023-11-21 NOTE — Telephone Encounter (Signed)
Pharmacy Patient Advocate Encounter   Received notification from RX Request Messages that prior authorization for Nurtec 75MG  dispersible tablets is required/requested.   Insurance verification completed.   The patient is insured through [facility] .   Per test claim: PA required; PA submitted to above mentioned insurance via CoverMyMeds Key/confirmation #/[id number] [facility] Status is pending

## 2023-11-22 ENCOUNTER — Encounter: Payer: Self-pay | Admitting: Internal Medicine

## 2023-11-22 ENCOUNTER — Encounter: Payer: Self-pay | Admitting: Diagnostic Neuroimaging

## 2023-11-22 NOTE — Telephone Encounter (Signed)
Spoke to patient will try good rx and go online to [email or web address]. Pt will go to pharmacy and see if this will help the cost of [employer] did approve however pt will not pay $166. Pt states if medication is not $50 or below she will not take and will ask for another acute migraine medication. Pt thanked me for calling

## 2023-11-26 ENCOUNTER — Encounter: Payer: Self-pay | Admitting: Internal Medicine

## 2023-11-26 ENCOUNTER — Other Ambulatory Visit (HOSPITAL_COMMUNITY): Payer: Self-pay

## 2023-11-26 NOTE — Telephone Encounter (Signed)
Pharmacy Patient Advocate Encounter  Received notification from [facility] that Prior Authorization for Nurtec 75mg  dispersible tablets has been APPROVED from 11/21/2023 to 02/13/2024. Ran test claim, Copay is $0.00. This test claim was processed through [facility] [place]- copay amounts may vary at other pharmacies due to pharmacy/plan contracts, or as the patient moves through the different stages of their insurance plan.   PA #/Case ID/Reference #: [id number]

## 2023-11-26 NOTE — Telephone Encounter (Signed)
Noted  

## 2023-11-29 ENCOUNTER — Encounter: Payer: Self-pay | Admitting: Internal Medicine

## 2023-12-04 ENCOUNTER — Observation Stay (HOSPITAL_COMMUNITY)
Admission: EM | Admit: 2023-12-04 | Discharge: 2023-12-07 | Disposition: A | Discharge status: Home / Self Care | Payer: BC Managed Care – PPO | Attending: Surgery | Admitting: Surgery

## 2023-12-04 DIAGNOSIS — R932 Abnormal findings on diagnostic imaging of liver and biliary tract: Secondary | ICD-10-CM | POA: Diagnosis not present

## 2023-12-04 DIAGNOSIS — Z87891 Personal history of nicotine dependence: Secondary | ICD-10-CM | POA: Insufficient documentation

## 2023-12-04 DIAGNOSIS — K458 Other specified abdominal hernia without obstruction or gangrene: Secondary | ICD-10-CM | POA: Diagnosis not present

## 2023-12-04 DIAGNOSIS — K811 Chronic cholecystitis: Secondary | ICD-10-CM | POA: Insufficient documentation

## 2023-12-04 DIAGNOSIS — K449 Diaphragmatic hernia without obstruction or gangrene: Secondary | ICD-10-CM | POA: Diagnosis not present

## 2023-12-04 DIAGNOSIS — K862 Cyst of pancreas: Secondary | ICD-10-CM | POA: Diagnosis not present

## 2023-12-04 DIAGNOSIS — R1010 Upper abdominal pain, unspecified: Secondary | ICD-10-CM | POA: Diagnosis present

## 2023-12-04 LAB — URINALYSIS, ROUTINE W REFLEX MICROSCOPIC
Bilirubin Urine: NEGATIVE
Glucose, UA: NEGATIVE mg/dL
Hgb urine dipstick: NEGATIVE
Ketones, ur: NEGATIVE mg/dL
Leukocytes,Ua: NEGATIVE
Nitrite: NEGATIVE
Protein, ur: NEGATIVE mg/dL
Specific Gravity, Urine: 1.029 (ref 1.005–1.030)
pH: 6 (ref 5.0–8.0)

## 2023-12-04 LAB — CBC WITH DIFFERENTIAL/PLATELET
Abs Immature Granulocytes: 0.02 10*3/uL (ref 0.00–0.07)
Basophils Absolute: 0 10*3/uL (ref 0.0–0.1)
Basophils Relative: 1 %
Eosinophils Absolute: 0.1 10*3/uL (ref 0.0–0.5)
Eosinophils Relative: 2 %
HCT: 40.1 % (ref 36.0–46.0)
Hemoglobin: 12.6 g/dL (ref 12.0–15.0)
Immature Granulocytes: 0 %
Lymphocytes Relative: 47 %
Lymphs Abs: 3.5 10*3/uL (ref 0.7–4.0)
MCH: 29.4 pg (ref 26.0–34.0)
MCHC: 31.4 g/dL (ref 30.0–36.0)
MCV: 93.7 fL (ref 80.0–100.0)
Monocytes Absolute: 0.6 10*3/uL (ref 0.1–1.0)
Monocytes Relative: 7 %
Neutro Abs: 3.2 10*3/uL (ref 1.7–7.7)
Neutrophils Relative %: 43 %
Platelets: 368 10*3/uL (ref 150–400)
RBC: 4.28 MIL/uL (ref 3.87–5.11)
RDW: 13.6 % (ref 11.5–15.5)
WBC: 7.5 10*3/uL (ref 4.0–10.5)
nRBC: 0 % (ref 0.0–0.2)

## 2023-12-04 LAB — COMPREHENSIVE METABOLIC PANEL
ALT: 14 U/L (ref 0–44)
AST: 20 U/L (ref 15–41)
Albumin: 3.9 g/dL (ref 3.5–5.0)
Alkaline Phosphatase: 160 U/L — ABNORMAL HIGH (ref 38–126)
Anion gap: 6 (ref 5–15)
BUN: 13 mg/dL (ref 6–20)
CO2: 26 mmol/L (ref 22–32)
Calcium: 9.3 mg/dL (ref 8.9–10.3)
Chloride: 103 mmol/L (ref 98–111)
Creatinine, Ser: 0.64 mg/dL (ref 0.44–1.00)
GFR, Estimated: >60 mL/min (ref >60)
Glucose, Bld: 94 mg/dL (ref 70–99)
Potassium: 4 mmol/L (ref 3.5–5.1)
Sodium: 135 mmol/L (ref 135–145)
Total Bilirubin: 0.4 mg/dL (ref <1.2)
Total Protein: 7.6 g/dL (ref 6.5–8.1)

## 2023-12-04 NOTE — ED Triage Notes (Signed)
Patient states she has been having issues with her gallbladder since September, started with abdominal pain. Had gallbladder work up and was found to have sludge. Had ultrasound done on 10.11.24. 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 Was started on Ursodiol to prevent gallstone back in November. Has been doing well til then until tonight when she had an attack- abdominal pain that radiates to her back. Pain is coming and going. No N/V/D and no fever. Called her MD and was informed to go to ER for CT scan and blood work.

## 2023-12-05 ENCOUNTER — Encounter (HOSPITAL_COMMUNITY)
Admission: EM | Disposition: A | Discharge status: Home / Self Care | Payer: Self-pay | Source: Home / Self Care | Attending: Emergency Medicine

## 2023-12-05 ENCOUNTER — Observation Stay (HOSPITAL_COMMUNITY): Payer: BC Managed Care – PPO | Admitting: Anesthesiology

## 2023-12-05 ENCOUNTER — Other Ambulatory Visit: Payer: Self-pay

## 2023-12-05 ENCOUNTER — Emergency Department (HOSPITAL_COMMUNITY): Payer: BC Managed Care – PPO

## 2023-12-05 ENCOUNTER — Encounter (HOSPITAL_COMMUNITY): Payer: Self-pay

## 2023-12-05 DIAGNOSIS — R932 Abnormal findings on diagnostic imaging of liver and biliary tract: Secondary | ICD-10-CM | POA: Diagnosis not present

## 2023-12-05 DIAGNOSIS — K458 Other specified abdominal hernia without obstruction or gangrene: Secondary | ICD-10-CM | POA: Diagnosis not present

## 2023-12-05 DIAGNOSIS — R1010 Upper abdominal pain, unspecified: Secondary | ICD-10-CM | POA: Diagnosis not present

## 2023-12-05 DIAGNOSIS — K862 Cyst of pancreas: Secondary | ICD-10-CM | POA: Diagnosis not present

## 2023-12-05 DIAGNOSIS — K449 Diaphragmatic hernia without obstruction or gangrene: Secondary | ICD-10-CM | POA: Diagnosis not present

## 2023-12-05 HISTORY — PX: LAPAROSCOPY: SHX197

## 2023-12-05 LAB — PREGNANCY, URINE: Preg Test, Ur: NEGATIVE

## 2023-12-05 LAB — SURGICAL PCR SCREEN
MRSA, PCR: NEGATIVE
Staphylococcus aureus: NEGATIVE

## 2023-12-05 LAB — LIPASE, BLOOD: Lipase: 25 U/L (ref 11–51)

## 2023-12-05 LAB — HIV ANTIBODY (ROUTINE TESTING W REFLEX): HIV Screen 4th Generation wRfx: NONREACTIVE

## 2023-12-05 SURGERY — LAPAROSCOPY, DIAGNOSTIC
Anesthesia: General

## 2023-12-05 MED ORDER — MIDAZOLAM HCL 5 MG/5ML IJ SOLN
INTRAMUSCULAR | Status: DC | PRN
  Administered 2023-12-05: 2 mg via INTRAVENOUS

## 2023-12-05 MED ORDER — IOHEXOL 9 MG/ML PO SOLN
1000.0000 mL | ORAL | Status: AC
  Administered 2023-12-05: 500 mL via ORAL

## 2023-12-05 MED ORDER — HYDROMORPHONE HCL 1 MG/ML IJ SOLN
0.2500 mg | INTRAMUSCULAR | Status: DC | PRN
  Administered 2023-12-05: 0.25 mg via INTRAVENOUS

## 2023-12-05 MED ORDER — BUPIVACAINE-EPINEPHRINE (PF) 0.5% -1:200000 IJ SOLN
INTRAMUSCULAR | Status: DC | PRN
Start: 2023-12-05 — End: 2023-12-05
  Administered 2023-12-05: 30 mL via PERINEURAL

## 2023-12-05 MED ORDER — HYDROMORPHONE HCL 1 MG/ML IJ SOLN
1.0000 mg | Freq: Once | INTRAMUSCULAR | Status: AC
Start: 2023-12-05 — End: 2023-12-05
  Administered 2023-12-05: 1 mg via INTRAVENOUS
  Filled 2023-12-05: qty 1

## 2023-12-05 MED ORDER — ALBUMIN HUMAN 5 % IV SOLN: INTRAVENOUS | Status: DC | PRN

## 2023-12-05 MED ORDER — PROPOFOL 10 MG/ML IV BOLUS
INTRAVENOUS | Status: DC | PRN
  Administered 2023-12-05: 150 mg via INTRAVENOUS

## 2023-12-05 MED ORDER — DIPHENHYDRAMINE HCL 50 MG/ML IJ SOLN: 25.0000 mg | Freq: Four times a day (QID) | INTRAMUSCULAR | Status: DC | PRN

## 2023-12-05 MED ORDER — CHLORHEXIDINE GLUCONATE 0.12 % MT SOLN
15.0000 mL | Freq: Once | OROMUCOSAL | Status: AC
  Administered 2023-12-05: 15 mL via OROMUCOSAL

## 2023-12-05 MED ORDER — PROPOFOL 10 MG/ML IV BOLUS
INTRAVENOUS | Status: AC
Start: 2023-12-05 — End: ?
  Filled 2023-12-05: qty 20

## 2023-12-05 MED ORDER — AMPHETAMINE-DEXTROAMPHET ER 30 MG PO CP24: 30.0000 mg | ORAL_CAPSULE | Freq: Every day | ORAL | Status: DC

## 2023-12-05 MED ORDER — HYDROMORPHONE HCL 1 MG/ML IJ SOLN
0.5000 mg | INTRAMUSCULAR | Status: DC | PRN
  Administered 2023-12-05 – 2023-12-07 (×8): 1 mg via INTRAVENOUS
  Filled 2023-12-05 (×8): qty 1

## 2023-12-05 MED ORDER — FENTANYL CITRATE (PF) 100 MCG/2ML IJ SOLN
INTRAMUSCULAR | Status: DC | PRN
  Administered 2023-12-05 (×2): 100 ug via INTRAVENOUS

## 2023-12-05 MED ORDER — IOHEXOL 9 MG/ML PO SOLN
ORAL | Status: AC
  Filled 2023-12-05: qty 1000

## 2023-12-05 MED ORDER — BUPIVACAINE-EPINEPHRINE (PF) 0.5% -1:200000 IJ SOLN
INTRAMUSCULAR | Status: AC
  Filled 2023-12-05: qty 30

## 2023-12-05 MED ORDER — CEFAZOLIN SODIUM-DEXTROSE 2-4 GM/100ML-% IV SOLN
2.0000 g | INTRAVENOUS | Status: AC
  Administered 2023-12-05: 2 g via INTRAVENOUS
  Filled 2023-12-05: qty 100

## 2023-12-05 MED ORDER — DULOXETINE HCL 60 MG PO CPEP
60.0000 mg | ORAL_CAPSULE | Freq: Every day | ORAL | Status: DC
  Administered 2023-12-06 – 2023-12-07 (×2): 60 mg via ORAL
  Filled 2023-12-05 (×2): qty 1

## 2023-12-05 MED ORDER — MIDAZOLAM HCL 2 MG/2ML IJ SOLN
INTRAMUSCULAR | Status: AC
  Filled 2023-12-05: qty 2

## 2023-12-05 MED ORDER — DIPHENHYDRAMINE HCL 25 MG PO CAPS: 25.0000 mg | ORAL_CAPSULE | Freq: Four times a day (QID) | ORAL | Status: DC | PRN

## 2023-12-05 MED ORDER — HYDRALAZINE HCL 20 MG/ML IJ SOLN: 10.0000 mg | INTRAMUSCULAR | Status: DC | PRN

## 2023-12-05 MED ORDER — DEXMEDETOMIDINE HCL IN NACL 80 MCG/20ML IV SOLN
INTRAVENOUS | Status: DC | PRN
  Administered 2023-12-05: 12 ug via INTRAVENOUS

## 2023-12-05 MED ORDER — FENTANYL CITRATE PF 50 MCG/ML IJ SOSY
50.0000 ug | PREFILLED_SYRINGE | Freq: Once | INTRAMUSCULAR | Status: AC
Start: 2023-12-05 — End: 2023-12-05
  Administered 2023-12-05: 50 ug via INTRAVENOUS
  Filled 2023-12-05: qty 1

## 2023-12-05 MED ORDER — FENTANYL CITRATE (PF) 100 MCG/2ML IJ SOLN
INTRAMUSCULAR | Status: AC
  Filled 2023-12-05: qty 2

## 2023-12-05 MED ORDER — KCL IN DEXTROSE-NACL 20-5-0.45 MEQ/L-%-% IV SOLN
INTRAVENOUS | Status: AC
  Filled 2023-12-05: qty 1000

## 2023-12-05 MED ORDER — ONDANSETRON HCL 4 MG/2ML IJ SOLN
4.0000 mg | Freq: Once | INTRAMUSCULAR | Status: AC
  Administered 2023-12-05: 4 mg via INTRAVENOUS
  Filled 2023-12-05: qty 2

## 2023-12-05 MED ORDER — DROPERIDOL 2.5 MG/ML IJ SOLN: 0.6250 mg | Freq: Once | INTRAMUSCULAR | Status: DC | PRN

## 2023-12-05 MED ORDER — ALBUMIN HUMAN 5 % IV SOLN
INTRAVENOUS | Status: AC
  Filled 2023-12-05: qty 500

## 2023-12-05 MED ORDER — ROCURONIUM BROMIDE 10 MG/ML (PF) SYRINGE
PREFILLED_SYRINGE | INTRAVENOUS | Status: AC
  Filled 2023-12-05: qty 10

## 2023-12-05 MED ORDER — LACTATED RINGERS IV SOLN: INTRAVENOUS | Status: DC

## 2023-12-05 MED ORDER — ACETAMINOPHEN 10 MG/ML IV SOLN
INTRAVENOUS | Status: AC
  Filled 2023-12-05: qty 100

## 2023-12-05 MED ORDER — STERILE WATER FOR IRRIGATION IR SOLN
Status: DC | PRN
  Administered 2023-12-05: 1000 mL

## 2023-12-05 MED ORDER — IOHEXOL 300 MG/ML  SOLN
75.0000 mL | Freq: Once | INTRAMUSCULAR | Status: AC | PRN
  Administered 2023-12-05: 75 mL via INTRAVENOUS

## 2023-12-05 MED ORDER — ENOXAPARIN SODIUM 40 MG/0.4ML IJ SOSY
40.0000 mg | PREFILLED_SYRINGE | INTRAMUSCULAR | Status: DC
  Administered 2023-12-06 – 2023-12-07 (×2): 40 mg via SUBCUTANEOUS
  Filled 2023-12-05 (×2): qty 0.4

## 2023-12-05 MED ORDER — FENTANYL CITRATE PF 50 MCG/ML IJ SOSY
PREFILLED_SYRINGE | INTRAMUSCULAR | Status: AC
  Filled 2023-12-05: qty 1

## 2023-12-05 MED ORDER — SUGAMMADEX SODIUM 200 MG/2ML IV SOLN
INTRAVENOUS | Status: DC | PRN
  Administered 2023-12-05: 200 mg via INTRAVENOUS

## 2023-12-05 MED ORDER — ORAL CARE MOUTH RINSE: 15.0000 mL | Freq: Once | OROMUCOSAL | Status: AC

## 2023-12-05 MED ORDER — METHOCARBAMOL 1000 MG/10ML IJ SOLN
500.0000 mg | Freq: Three times a day (TID) | INTRAMUSCULAR | Status: DC | PRN
  Administered 2023-12-05 – 2023-12-06 (×2): 500 mg via INTRAVENOUS
  Filled 2023-12-05 (×2): qty 10

## 2023-12-05 MED ORDER — ACETAMINOPHEN 10 MG/ML IV SOLN
1000.0000 mg | Freq: Once | INTRAVENOUS | Status: DC | PRN
Start: 2023-12-05 — End: 2023-12-05
  Administered 2023-12-05: 1000 mg via INTRAVENOUS

## 2023-12-05 MED ORDER — SIMETHICONE 80 MG PO CHEW
40.0000 mg | CHEWABLE_TABLET | Freq: Four times a day (QID) | ORAL | Status: DC | PRN
  Administered 2023-12-06 (×2): 40 mg via ORAL
  Filled 2023-12-05 (×2): qty 1

## 2023-12-05 MED ORDER — PANTOPRAZOLE SODIUM 40 MG IV SOLR
40.0000 mg | Freq: Every day | INTRAVENOUS | Status: DC
  Administered 2023-12-05 – 2023-12-06 (×2): 40 mg via INTRAVENOUS
  Filled 2023-12-05 (×2): qty 10

## 2023-12-05 MED ORDER — METOPROLOL TARTRATE 5 MG/5ML IV SOLN: 5.0000 mg | Freq: Four times a day (QID) | INTRAVENOUS | Status: DC | PRN

## 2023-12-05 MED ORDER — ROCURONIUM BROMIDE 100 MG/10ML IV SOLN
INTRAVENOUS | Status: DC | PRN
  Administered 2023-12-05: 50 mg via INTRAVENOUS
  Administered 2023-12-05: 30 mg via INTRAVENOUS
  Administered 2023-12-05 (×2): 20 mg via INTRAVENOUS

## 2023-12-05 MED ORDER — LIDOCAINE HCL (CARDIAC) PF 100 MG/5ML IV SOSY
PREFILLED_SYRINGE | INTRAVENOUS | Status: DC | PRN
  Administered 2023-12-05: 100 mg via INTRAVENOUS

## 2023-12-05 MED ORDER — SODIUM CHLORIDE 0.9 % IR SOLN
Status: DC | PRN
  Administered 2023-12-05: 1000 mL

## 2023-12-05 MED ORDER — SODIUM CHLORIDE 0.9 % IV BOLUS
500.0000 mL | Freq: Once | INTRAVENOUS | Status: AC
  Administered 2023-12-05: 500 mL via INTRAVENOUS

## 2023-12-05 MED ORDER — DEXAMETHASONE SODIUM PHOSPHATE 10 MG/ML IJ SOLN
INTRAMUSCULAR | Status: DC | PRN
  Administered 2023-12-05: 10 mg via INTRAVENOUS

## 2023-12-05 MED ORDER — METHOCARBAMOL 500 MG PO TABS: 500.0000 mg | ORAL_TABLET | Freq: Three times a day (TID) | ORAL | Status: DC | PRN

## 2023-12-05 MED ORDER — ONDANSETRON 4 MG PO TBDP: 4.0000 mg | ORAL_TABLET | Freq: Four times a day (QID) | ORAL | Status: DC | PRN

## 2023-12-05 MED ORDER — IOHEXOL 300 MG/ML  SOLN
100.0000 mL | Freq: Once | INTRAMUSCULAR | Status: AC | PRN
  Administered 2023-12-05: 100 mL via INTRAVENOUS

## 2023-12-05 MED ORDER — ONDANSETRON HCL 4 MG/2ML IJ SOLN
INTRAMUSCULAR | Status: DC | PRN
  Administered 2023-12-05: 4 mg via INTRAVENOUS

## 2023-12-05 MED ORDER — HYDROMORPHONE HCL 1 MG/ML IJ SOLN
INTRAMUSCULAR | Status: AC
  Administered 2023-12-05: 0.25 mg via INTRAVENOUS
  Filled 2023-12-05: qty 1

## 2023-12-05 MED ORDER — HYDROMORPHONE HCL 1 MG/ML IJ SOLN
1.0000 mg | Freq: Once | INTRAMUSCULAR | Status: AC
  Administered 2023-12-05: 1 mg via INTRAVENOUS
  Filled 2023-12-05: qty 1

## 2023-12-05 MED ORDER — ONDANSETRON HCL 4 MG/2ML IJ SOLN
4.0000 mg | Freq: Four times a day (QID) | INTRAMUSCULAR | Status: DC | PRN
Start: 2023-12-05 — End: 2023-12-07
  Administered 2023-12-05 – 2023-12-06 (×2): 4 mg via INTRAVENOUS
  Filled 2023-12-05 (×2): qty 2

## 2023-12-05 MED ORDER — FENTANYL CITRATE PF 50 MCG/ML IJ SOSY: 50.0000 ug | PREFILLED_SYRINGE | Freq: Once | INTRAMUSCULAR | Status: DC

## 2023-12-05 SURGICAL SUPPLY — 75 items
APPLIER CLIP 5 13 M/L LIGAMAX5 (MISCELLANEOUS) ×1
APPLIER CLIP ROT 10 11.4 M/L (STAPLE)
BAG COUNTER SPONGE SURGICOUNT (BAG) ×1 IMPLANT
BLADE CLIPPER SURG (BLADE) IMPLANT
BLADE EXTENDED COATED 6.5IN (ELECTRODE) IMPLANT
BLADE SURG SZ10 CARB STEEL (BLADE) ×1 IMPLANT
CANISTER SUCT 3000ML PPV (MISCELLANEOUS) ×1 IMPLANT
CELLS DAT CNTRL 66122 CELL SVR (MISCELLANEOUS) IMPLANT
CHLORAPREP W/TINT 26 (MISCELLANEOUS) ×1 IMPLANT
CLIP APPLIE 5 13 M/L LIGAMAX5 (MISCELLANEOUS) IMPLANT
CLIP APPLIE ROT 10 11.4 M/L (STAPLE) IMPLANT
CLIP SUT LAPRA TY ABSORB (SUTURE) IMPLANT
COVER MAYO STAND STRL (DRAPES) IMPLANT
COVER SURGICAL LIGHT HANDLE (MISCELLANEOUS) ×1 IMPLANT
DERMABOND ADVANCED .7 DNX12 (GAUZE/BANDAGES/DRESSINGS) ×1 IMPLANT
DEVICE SUTURE ENDOST 10MM (ENDOMECHANICALS) IMPLANT
DRAPE INCISE IOBAN 66X45 STRL (DRAPES) IMPLANT
DRAPE UTILITY XL STRL (DRAPES) IMPLANT
DRAPE WARM FLUID 44X44 (DRAPES) ×1 IMPLANT
ELECT PENCIL ROCKER SW 15FT (MISCELLANEOUS) IMPLANT
ELECT REM PT RETURN 15FT ADLT (MISCELLANEOUS) ×1 IMPLANT
GAUZE SPONGE 4X4 12PLY STRL (GAUZE/BANDAGES/DRESSINGS) IMPLANT
GLOVE BIOGEL PI IND STRL 6 (GLOVE) ×1 IMPLANT
GLOVE BIOGEL PI MICRO STRL 5.5 (GLOVE) ×1 IMPLANT
GOWN STRL REUS W/ TWL LRG LVL3 (GOWN DISPOSABLE) ×2 IMPLANT
GRASPER SUT TROCAR 14GX15 (MISCELLANEOUS) IMPLANT
HANDLE SUCTION POOLE (INSTRUMENTS) IMPLANT
IRRIG SUCT STRYKERFLOW 2 WTIP (MISCELLANEOUS)
IRRIGATION SUCT STRKRFLW 2 WTP (MISCELLANEOUS) IMPLANT
KIT BASIN OR (CUSTOM PROCEDURE TRAY) ×1 IMPLANT
KIT TURNOVER KIT A (KITS) ×1 IMPLANT
L-HOOK LAP DISP 36CM (ELECTROSURGICAL) ×1
LHOOK LAP DISP 36CM (ELECTROSURGICAL) IMPLANT
LIGASURE IMPACT 36 18CM CVD LR (INSTRUMENTS) IMPLANT
NDL INSUFFLATION 14GA 120MM (NEEDLE) IMPLANT
NEEDLE INSUFFLATION 14GA 120MM (NEEDLE) ×1 IMPLANT
NS IRRIG 1000ML POUR BTL (IV SOLUTION) ×2 IMPLANT
PENCIL SMOKE EVACUATOR (MISCELLANEOUS) ×1 IMPLANT
RELOAD SUT SNGL STCH BLK 2-0 (ENDOMECHANICALS) IMPLANT
RETRACTOR WND ALEXIS 18 MED (MISCELLANEOUS) IMPLANT
RTRCTR WOUND ALEXIS 18CM MED (MISCELLANEOUS)
SCISSORS LAP 5X35 DISP (ENDOMECHANICALS) IMPLANT
SET TUBE SMOKE EVAC HIGH FLOW (TUBING) ×1 IMPLANT
SHEARS HARMONIC 36 ACE (MISCELLANEOUS) IMPLANT
SLEEVE ADV FIXATION 5X100MM (TROCAR) ×2 IMPLANT
SLEEVE Z-THREAD 5X100MM (TROCAR) IMPLANT
SPIKE FLUID TRANSFER (MISCELLANEOUS) ×1 IMPLANT
SPONGE T-LAP 18X18 ~~LOC~~+RFID (SPONGE) IMPLANT
STAPLER SKIN PROX WIDE 3.9 (STAPLE) IMPLANT
SUCTION POOLE HANDLE (INSTRUMENTS)
SUT MNCRL AB 4-0 PS2 18 (SUTURE) ×1 IMPLANT
SUT PDS AB 0 CT 36 (SUTURE) IMPLANT
SUT PDS AB 0 CTX 36 PDP370T (SUTURE) IMPLANT
SUT PDS AB 1 CT 36 (SUTURE) IMPLANT
SUT RELOAD ENDO STITCH 2.0 (ENDOMECHANICALS) ×2
SUT SILK 2 0 SH CR/8 (SUTURE) ×1 IMPLANT
SUT SILK 3 0 SH CR/8 (SUTURE) IMPLANT
SUT VIC AB 3-0 SH 27X BRD (SUTURE) IMPLANT
SUT VICRYL 0 UR6 27IN ABS (SUTURE) IMPLANT
SUTURE RELOAD ENDO STITCH 2.0 (ENDOMECHANICALS) ×2 IMPLANT
SYR BULB IRRIG 60ML STRL (SYRINGE) IMPLANT
SYS BAG RETRIEVAL 10MM (BASKET) ×1
SYS LAPSCP GELPORT 120MM (MISCELLANEOUS)
SYSTEM BAG RETRIEVAL 10MM (BASKET) IMPLANT
SYSTEM LAPSCP GELPORT 120MM (MISCELLANEOUS) IMPLANT
TOWEL GREEN STERILE FF (TOWEL DISPOSABLE) ×2 IMPLANT
TRAY FOLEY MTR SLVR 14FR STAT (SET/KITS/TRAYS/PACK) ×1 IMPLANT
TRAY LAPAROSCOPIC (CUSTOM PROCEDURE TRAY) ×1 IMPLANT
TROCAR 11X100 Z THREAD (TROCAR) IMPLANT
TROCAR ADV FIXATION 12X100MM (TROCAR) IMPLANT
TROCAR BALLN 12MMX100 BLUNT (TROCAR) IMPLANT
TROCAR Z-THREAD FIOS 5X100MM (TROCAR) ×1 IMPLANT
TROCAR Z-THREAD OPTICAL 5X100M (TROCAR) IMPLANT
WATER STERILE IRR 1000ML POUR (IV SOLUTION) ×1 IMPLANT
YANKAUER SUCT BULB TIP 10FT TU (MISCELLANEOUS) IMPLANT

## 2023-12-05 NOTE — Anesthesia Procedure Notes (Signed)
Procedure Name: Intubation Date/Time: 12/05/2023 1:27 PM  Performed by: [place], Clydene Pugh, CRNAPre-anesthesia Checklist: Patient identified, Emergency Drugs available, Suction available and Patient being monitored Patient Re-evaluated:Patient Re-evaluated prior to induction Oxygen Delivery Method: Circle system utilized Preoxygenation: Pre-oxygenation with 100% oxygen Induction Type: IV induction Ventilation: Mask ventilation without difficulty Laryngoscope Size: Mac and 3 Grade View: Grade II Tube type: Oral Tube size: 7.0 mm Number of attempts: 1 Airway Equipment and Method: Stylet and Oral airway Placement Confirmation: ETT inserted through vocal cords under direct vision, positive ETCO2 and breath sounds checked- equal and bilateral Secured at: 21 cm Tube secured with: Tape Dental Injury: Teeth and Oropharynx as per pre-operative assessment

## 2023-12-05 NOTE — Anesthesia Preprocedure Evaluation (Addendum)
Anesthesia Evaluation  Patient identified by MRN, date of birth, ID band Patient awake    Reviewed: Allergy & Precautions, NPO status , Patient's Chart, lab work & pertinent test results  Airway Mallampati: II  TM Distance: >3 FB Neck ROM: Full    Dental no notable dental hx. (+) Teeth Intact, Dental Advisory Given   Pulmonary former smoker   Pulmonary exam normal breath sounds clear to auscultation       Cardiovascular negative cardio ROS Normal cardiovascular exam Rhythm:Regular Rate:Normal     Neuro/Psych  Headaches PSYCHIATRIC DISORDERS Anxiety Depression       GI/Hepatic S/p Gastric Bypass   Endo/Other    Renal/GU      Musculoskeletal   Abdominal   Peds  Hematology Lab Results      Component                Value               Date                      WBC                      7.5                 12/04/2023                HGB                      12.6                12/04/2023                HCT                      40.1                12/04/2023                MCV                      93.7                12/04/2023                PLT                      368                 12/04/2023              Anesthesia Other Findings   Reproductive/Obstetrics negative OB ROS                              Anesthesia Physical Anesthesia Plan  ASA: 1  Anesthesia Plan: General   Post-op Pain Management: Toradol IV (intra-op)*, Ofirmev IV (intra-op)* and Precedex   Induction: Intravenous  PONV Risk Score and Plan: 4 or greater and Treatment may vary due to age or medical condition, Ondansetron, Midazolam and Dexamethasone  Airway Management Planned: Oral ETT  Additional Equipment: None  Intra-op Plan:   Post-operative Plan: Extubation in OR  Informed Consent: I have reviewed the patients History and Physical, chart, labs and discussed the procedure including the risks, benefits and  alternatives for the proposed anesthesia with the patient  or authorized representative who has indicated his/her understanding and acceptance.     Dental advisory given  Plan Discussed with: CRNA and Anesthesiologist  Anesthesia Plan Comments:          Anesthesia Quick Evaluation

## 2023-12-05 NOTE — ED Provider Notes (Signed)
[place] EMERGENCY DEPARTMENT AT [facility] Provider Note   CSN: [id number] Arrival date & time: 12/04/23  2049     History  Chief Complaint  Patient presents with   Abdominal Pain    Jessica Banks is a 42 y.o. female.  The history is provided by the patient.   Jessica Banks is a 42 y.o. female who presents to the Emergency Department complaining of abdominal pain.  She presents to the emergency department for evaluation of upper abdominal pain that started today.  She has had episodes of waxing and waning upper abdominal pain after eating greasy food for the last several months.  Episodes normally last about 30 minutes and then resolved.  She saw her surgeon and had a right upper quadrant ultrasound performed in October that demonstrated biliary sludge and she was started on ursodiol.  She had been doing well on that medication.  Today she developed severe epigastric abdominal pain that radiates to the back bilaterally that occurred before eating.  Pain has waxed and waned throughout the day and comes in waves.  She was treated with ketorolac 60 mg IM and did have transient improvement in her pain.  She did take Tums as well.  She did briefly have nausea, now resolved.  No fever, vomiting.  Bowel movements are normal.  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  No medical problems.  LMP November 29.     Home Medications Prior to Admission medications   Medication Sig Start Date End Date Taking? Authorizing Provider  amphetamine-dextroamphetamine (ADDERALL XR) 30 MG 24 hr capsule Take 30 mg by mouth daily.    [provider]  butalbital-acetaminophen-caffeine (FIORICET) 50-325-40 MG tablet Take 1 tablet by mouth 2 (two) times daily as needed. 11/19/23   Penumalli, Glenford Bayley, MD  cyclobenzaprine (FLEXERIL) 10 MG tablet Take 1 tablet (10 mg total) by mouth daily as needed. 11/19/23   Penumalli, Glenford Bayley, MD  DULoxetine (CYMBALTA) 60 MG capsule Take 1 capsule (60 mg total) by mouth daily. 11/19/23   Penumalli, Glenford Bayley, MD  Galcanezumab-gnlm (EMGALITY) 120 MG/ML SOAJ Inject 1 Pen into the skin every 30 (thirty) days. 11/19/23   Penumalli, Glenford Bayley, MD  Melatonin 10 MG CAPS Take by mouth.    [provider]  Multiple Vitamin (MULTIVITAMIN) tablet Take 1 tablet by mouth daily.    [provider]  nystatin powder Apply 1 Application topically 3 (three) times daily. prn 03/28/23   Dorothyann Peng, MD  Probiotic Product (FORTIFY PROBIOTIC WOMENS PO) Take by mouth.    [provider]  Rimegepant Sulfate (NURTEC) 75 MG TBDP Take 1 tablet (75 mg total) by mouth daily as needed. 11/19/23   Penumalli, Glenford Bayley, MD  temazepam (RESTORIL) 15 MG capsule TAKE 1 CAPSULE BY MOUTH AT BEDTIME AS NEEDED FOR SLEEP 10/17/23   Dorothyann Peng, MD  ursodiol (ACTIGALL) 300 MG capsule Take 300 mg by mouth 2 (two) times daily.    [provider]  VITAMIN D PO Take 10 mcg by mouth daily.    [provider]      Allergies    Novocain [procaine] and Povidone iodine    Review of Systems   Review of Systems  All other systems reviewed and are negative.   Physical Exam Updated Vital Signs BP 120/84   Pulse 66   Temp 98.6 F (37 C)   Resp 15  SpO2 100%  Physical Exam Vitals and nursing note reviewed.  Constitutional:      Appearance: She is well-developed.  HENT:     Head: Normocephalic and atraumatic.  Cardiovascular:     Rate and Rhythm: Normal rate and regular rhythm.     Heart sounds: No murmur heard. Pulmonary:     Effort: Pulmonary effort is normal. No respiratory distress.     Breath sounds: Normal breath sounds.  Abdominal:     Palpations: Abdomen is soft.     Tenderness: There is no guarding or rebound.     Comments: Moderate LUQ tenderness  Musculoskeletal:        General: No tenderness.  Skin:    General: Skin is warm and dry.  Neurological:      Mental Status: She is alert and oriented to person, place, and time.  Psychiatric:        Behavior: Behavior normal.     ED Results / Procedures / Treatments   Labs (all labs ordered are listed, but only abnormal results are displayed) Labs Reviewed  COMPREHENSIVE METABOLIC PANEL - Abnormal; Notable for the following components:      Result Value   Alkaline Phosphatase 160 (*)    All other components within normal limits  CBC WITH DIFFERENTIAL/PLATELET  URINALYSIS, ROUTINE W REFLEX MICROSCOPIC  LIPASE, BLOOD  PREGNANCY, URINE    EKG None  Radiology CT ABDOMEN PELVIS W CONTRAST Result Date: 12/05/2023 CLINICAL DATA:  Postoperative abdominal pain EXAM: CT ABDOMEN AND PELVIS WITH CONTRAST TECHNIQUE: Multidetector CT imaging of the abdomen and pelvis was performed using the standard protocol following bolus administration of intravenous contrast. RADIATION DOSE REDUCTION: This exam was performed according to the departmental dose-optimization program which includes automated exposure control, adjustment of the mA and/or kV according to patient size and/or use of iterative reconstruction technique. CONTRAST:  75mL OMNIPAQUE IOHEXOL 300 MG/ML  SOLN COMPARISON:  Earlier today FINDINGS: Lower chest:  Small hiatal hernia. Hepatobiliary: No significant liver finding.Tiny cysts are likely present in the upper central liver. Full gallbladder, no calcified stone or biliary dilatation. 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 9 mm cyst at the pancreatic tail, not clearly seen on 2020 chest CT comparison. No pancreatic ductal dilatation. Follow-up recommendations provided on preceding scan. Spleen: Unremarkable. Adrenals/Urinary Tract: Negative adrenals. No hydronephrosis or stone. Limited assessment of the bladder due to internal contrast with streak artifact. Stomach/Bowel: Redemonstrated cluster of small bowel loops in the left abdomen which have edematous mesentery. There is some wasting of mesenteric  vessels marked on 2:35, these loops could be encapsulated by an internal hernia. No bowel wall thickening, obstruction, or mesenteric vessel compromise. There has been prior gastric bypass. Vascular/Lymphatic: No acute vascular abnormality. Left IVC. No mass or adenopathy. Reproductive:No pathologic findings.  Corpus luteum on the right. Other: Small volume ascites in the left upper quadrant, likely reactive. Musculoskeletal: No acute abnormalities. IMPRESSION: 1. Mesenteric edema in the left upper quadrant affecting a cluster of small bowel loops, possibly related to an internal hernia or diffusion, but no luminal obstruction, mesenteric vessel compromise, or bowel thickening. Trace reactive ascites in the left upper quadrant. 2. Pancreatic cyst as described on the immediate preceding abdominal CT. Electronically Signed   By: Tiburcio Pea M.D.   On: 12/05/2023 05:28   CT ABDOMEN PELVIS W CONTRAST Result Date: 12/05/2023 CLINICAL DATA:  Upper abdominal pain.  History of bariatric surgery. EXAM: CT ABDOMEN AND PELVIS WITH CONTRAST TECHNIQUE: Multidetector CT imaging of the abdomen and  pelvis was performed using the standard protocol following bolus administration of intravenous contrast. RADIATION DOSE REDUCTION: This exam was performed according to the departmental dose-optimization program which includes automated exposure control, adjustment of the mA and/or kV according to patient size and/or use of iterative reconstruction technique. CONTRAST:  OMNIPAQUE IOHEXOL 300 MG/ML  SOLN COMPARISON:  CT abdomen and pelvis 04/10/2016 FINDINGS: Lower chest: No acute abnormality. Hepatobiliary: Unremarkable liver. Normal gallbladder. No biliary dilation. Pancreas: 9 mm cystic lesion or lesions in the pancreatic tail is new since 2017. no ductal dilation or peripancreatic inflammation. Spleen: Unremarkable. Adrenals/Urinary Tract: Normal adrenal glands. No urinary calculi or hydronephrosis. Bladder is  unremarkable. Stomach/Bowel: Postoperative change gastric bypass. Normal caliber large and small bowel. Interloop edema within the jejunal mesentery in the left upper quadrant. Evaluation is mildly compromised by paucity of intra-abdominal fat and lack of oral contrast. No definite bowel wall thickening. The appendix is normal. 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 No enlarged abdominal or pelvic lymph nodes. Reproductive: Physiologic appearance of the uterus and ovaries. Other: Pre interloop fluid in the small bowel mesentery in the left upper quadrant and in the pelvis. No free intraperitoneal air. Musculoskeletal: No acute fracture. IMPRESSION: 1. Interloop edema within the jejunal mesentery in the left upper quadrant and in the pelvis. Evaluation is mildly compromised by paucity of intra-abdominal fat and lack of oral contrast. Findings are nonspecific but can be seen in the setting of enteritis. If further evaluation is desired, repeat CT with oral and IV contrast could be considered. 2. 9 mm cystic lesion or lesions in the pancreatic tail is new since 2017. Recommend contrast-enhanced MRI or pancreas protocol CT in 1 year. Electronically Signed   By: Minerva Fester M.D.   On: 12/05/2023 01:38    Procedures Procedures    Medications Ordered in ED Medications  iohexol (OMNIPAQUE) 300 MG/ML solution 100 mL (100 mLs Intravenous Contrast Given 12/05/23 0102)  fentaNYL (SUBLIMAZE) injection 50 mcg (50 mcg Intravenous Given 12/05/23 0121)  ondansetron (ZOFRAN) injection 4 mg (4 mg Intravenous Given 12/05/23 0120)  sodium chloride 0.9 % bolus 500 mL (0 mLs Intravenous Stopped 12/05/23 0436)  iohexol (OMNIPAQUE) 9 MG/ML oral solution 1,000 mL (500 mLs Oral Contrast Given 12/05/23 0231)  HYDROmorphone (DILAUDID) injection 1 mg (1 mg Intravenous Given 12/05/23 0235)  iohexol (OMNIPAQUE) 300 MG/ML solution 75 mL (75 mLs Intravenous Contrast Given 12/05/23 0414)   HYDROmorphone (DILAUDID) injection 1 mg (1 mg Intravenous Given 12/05/23 0604)    ED Course/ Medical Decision Making/ A&P                                 Medical Decision Making Amount and/or Complexity of Data Reviewed Labs: ordered. Radiology: ordered.  Risk Prescription drug management. Decision regarding hospitalization.   Patient here for evaluation of upper abdominal pain, has a history of gastric bypass, hiatal hernia repair.  She has focal left upper quadrant tenderness.  Initial CT scan is indeterminate for left upper quadrant edema.  Labs are reassuring with normal CBC, CMP and lipase aside from chronic elevation in alk phos.  Discussed with Dr. Donell Beers with general surgery, recommendation for repeat CT imaging.  Discussed with patient abnormal imaging and recommendation for repeat imaging and she is in agreement with plan.  Repeat CT imaging is also concerning for possible internal hernia without definite obstruction.  On repeat assessment patient continues to have ongoing pain.  Discussed with Dr. Donell Beers with surgery-surgery will see the patient in consult.  Discussed with patient findings of studies.        Final Clinical Impression(s) / ED Diagnoses Final diagnoses:  Internal hernia    Rx / DC Orders ED Discharge Orders     None         Tilden Fossa, MD 12/05/23 [phone]

## 2023-12-05 NOTE — Op Note (Signed)
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 She presented to the ED today with sudden onset severe abdominal pain, which began yesterday. A CT scan showed mesenteric edema, concerning for a possible internal hernia. She also reported symptoms of biliary colic and had known gallbladder sludge. After an extensive discussion of the risks and benefits of surgery, she agreed to proceed with diagnostic laparoscopy, as well as cholecystectomy.   Findings: Internal hernia of the small bowel through Petersen's space. The mesentery was edematous, with a small amount of chylous ascites, but the bowel was viable. The hernia was reduced and Petersen's defect was closed. Gallbladder was markedly distended without signs of acute cholecystitis.  Procedure details: Informed consent was obtained in the preoperative area prior to the procedure. The patient was brought to the operating room and placed on the table in the supine position. General anesthesia was induced and appropriate lines and drains were placed for intraoperative monitoring. Perioperative antibiotics were administered per SCIP guidelines. The abdomen was prepped and draped in the usual sterile fashion. A pre-procedure timeout was taken verifying patient identity, surgical site and procedure to be performed.  A small supraumbilical skin incision was made, the  subcutaneous tissue was divided with cautery, and the umbilical stalk was grasped and elevated.  The fascia was incised, the peritoneal cavity was visualized, and a 12 mm Hassan trocar was placed.  The abdomen was insufflated and inspected with no evidence of visceral or vascular injury.  Two 5 mm ports were placed under direct visualization in the right subcostal margin.  Additional 5 mm ports were placed in the left upper quadrant and in the right mid abdomen.  There was a small amount of chylous ascites noted in the pelvis, and there was congestion of the small bowel within the left upper quadrant.  The cecum and the terminal ileum were identified, and the small bowel was then run starting at the terminal ileum and working proximally.  The JJ anastomosis was identified and there did not appear to be an internal hernia at this site.  The Roux limb was then identified and run proximally up to the gastric pouch.  There appeared to be small bowel herniated through Petersen's space.  There was also an adhesive band between the Roux limb and an adjacent loop of small bowel.  This was divided with harmonic shears, taking care not to injure the small bowel.  We initially started to pull this through on the right side of the Roux limb, but this resulted in sting of the Roux limb.  The small bowel was reduced through Petersons space to the left side of the Roux limb.  This resulted in complete detorsion of the mesentery.  The small bowel was then run again in its entirety, including the Roux limb, the biliary limb, and the common channel.  There was some mesenteric lymphovascular congestion, but all of the small bowel was pink and well-perfused.  The left upper quadrant port was upsized to an 11 mm trocar.  Petersen's space was then closed with interrupted 3-0 silk sutures using an Endostitch.  Next we turned our attention to the gallbladder.  The fundus was grasped and retracted cephalad.  The infundibulum was retracted  laterally, and the peritoneum overlying the gallbladder was incised with cautery.  The cystic triangle was dissected out using cautery and blunt dissection, and the critical view of safety was obtained.  The cystic artery was clipped and divided, and the cystic duct was then clipped and divided sharply, leaving 2 clips behind on the cystic duct stump.  The gallbladder was taken off the liver using cautery.  The specimen was placed in an Endo Catch bag and extracted via the left upper quadrant port site.  The surgical site appeared hemostatic.  The fascia at the 11 mm LUQ port site was closed with 0 Vicryl suture using a PMI.  The remaining ports were removed and the abdomen was desufflated.  The umbilical port site fascia was closed with 0 Vicryl suture. The skin at all port sites was closed with subcuticular 4-0 Monocryl suture.  Dermabond was applied.  The patient tolerated the procedure well with no apparent complications. All counts were correct x2 at the end of the procedure. The patient was extubated and taken to PACU in stable condition.  Sophronia Simas, MD 12/05/23 3:44 PM

## 2023-12-05 NOTE — Transfer of Care (Signed)
Immediate Anesthesia Transfer of Care Note  Patient: Jessica Banks  Procedure(s) Performed: LAPAROSCOPY DIAGNOSTIC  Patient Location: PACU  Anesthesia Type:General  Level of Consciousness: sedated  Airway & Oxygen Therapy: Patient Spontanous Breathing and Patient connected to face mask oxygen  Post-op Assessment: Report given to RN and Post -op Vital signs reviewed and stable  Post vital signs: Reviewed and stable  Last Vitals:  Vitals Value Taken Time  BP 132/85 12/05/23 1540  Temp    Pulse 100 12/05/23 1541  Resp 23 12/05/23 1541  SpO2 92 % 12/05/23 1541  Vitals shown include unfiled device data.  Last Pain:  Vitals:   12/05/23 1137  TempSrc: Oral  PainSc: 6       Patients Stated Pain Goal: 4 (12/05/23 1137)  Complications: No notable events documented.

## 2023-12-05 NOTE — H&P (Signed)
AMYRACLE SCELSI 11/14/1981  [id number].    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  Since her bypass, her reflux has resolved.  She has been doing well and following up with her bariatric team.  She has recently been having some upper abdominal discomfort that was felt to possibly be secondary to her gb.  She was placed on ursodiol to help with this.  Her symptoms have actually improved with this until yesterday she developed an acute onset of LUQ abdominal pain. She had no other symptoms such as N/V/D, fevers, CP, SOB, etc.  She is compliant with taking her multivitamin.  She did have Toradol 60mg  given to her yesterday at work and this helped her pain, but then it returned once this wore off.  She eventually came to the ED as she thought her gallbladder was acting up.  Here she was noted to have normal labs, but a CT scan that is concerning for possible internal hernia in the LUQ, but no definitive bowel or vascular compromise at that time.  We have been asked to see this patient for further evaluation.  ROS: ROS: Please see HPI  Family History  Problem Relation Age of Onset   Thyroid disease Mother        s/p thyroidectomy   Arthritis Mother    Leukemia Maternal Grandmother    Stroke Maternal Grandmother    Cancer Maternal Grandfather        lung   Heart disease Paternal Grandmother    COPD Paternal Grandfather    Hypertension Maternal Uncle    Diabetes Maternal Uncle     Past Medical History:  Diagnosis Date   Abnormal Pap smear    CIN I   Anemia    Anxiety    Depression    off meds now, ok now   Enlarged thyroid gland    nromal function   GERD (gastroesophageal reflux disease)    Migraine    Obesity    Ovarian cyst    Vaginal Pap  smear, abnormal    ok now    Past Surgical History:  Procedure Laterality Date   GASTRIC BYPASS  08/14/2022   Revision of sleeve/hiatal hernia repair   LAPAROSCOPIC GASTRIC SLEEVE RESECTION  2017    Social History:  reports that she quit smoking about 10 years ago. Her smoking use included cigarettes. She started smoking about 14 years ago. She has never used smokeless tobacco. She reports that she does not currently use alcohol. She reports that she does not use drugs.  Allergies:  Allergies  Allergen Reactions   Novocain [Procaine] Anaphylaxis   Povidone Iodine Rash    Rash    (Not in a hospital admission)    Physical Exam: Blood pressure 120/84, pulse 66, temperature 98.6 F (37 C), resp. rate 15, SpO2 100%. General: pleasant, WD, WN female who is laying in bed in NAD HEENT: head is normocephalic, atraumatic.  Sclera are noninjected.  PERRL.  Ears and nose without any masses or lesions.  Mouth is pink and dry Heart: regular, rate, and rhythm.  Normal s1,s2. No obvious murmurs, gallops, or rubs noted.  Palpable pedal pulses bilaterally Lungs: CTAB, no wheezes, rhonchi, or rales noted.  Respiratory effort nonlabored Abd: soft, focally  tender in LUQ, but no peritonitis, ND, +BS, no masses, hernias, or organomegaly, several lap scars noted from previous surgeries MS: all 4 extremities are symmetrical with no cyanosis, clubbing, or edema. Psych: A&Ox3 with an appropriate affect.   Results for orders placed or performed during the hospital encounter of 12/04/23 (from the past 48 hours)  Pregnancy, urine     Status: None   Collection Time: 12/04/23 12:20 AM  Result Value Ref Range   Preg Test, Ur NEGATIVE NEGATIVE    Comment:        THE SENSITIVITY OF THIS METHODOLOGY IS >25 mIU/mL. Performed at [facility], [address], [place], [place] [address]   CBC with Differential     Status: None   Collection Time: 12/04/23  9:23 PM  Result Value Ref Range    WBC 7.5 4.0 - 10.5 K/uL   RBC 4.28 3.87 - 5.11 MIL/uL   Hemoglobin 12.6 12.0 - 15.0 g/dL   HCT 60.4 54.0 - 98.1 %   MCV 93.7 80.0 - 100.0 fL   MCH 29.4 26.0 - 34.0 pg   MCHC 31.4 30.0 - 36.0 g/dL   RDW 19.1 47.8 - 29.5 %   Platelets 368 150 - 400 K/uL   nRBC 0.0 0.0 - 0.2 %   Neutrophils Relative % 43 %   Neutro Abs 3.2 1.7 - 7.7 K/uL   Lymphocytes Relative 47 %   Lymphs Abs 3.5 0.7 - 4.0 K/uL   Monocytes Relative 7 %   Monocytes Absolute 0.6 0.1 - 1.0 K/uL   Eosinophils Relative 2 %   Eosinophils Absolute 0.1 0.0 - 0.5 K/uL   Basophils Relative 1 %   Basophils Absolute 0.0 0.0 - 0.1 K/uL   Immature Granulocytes 0 %   Abs Immature Granulocytes 0.02 0.00 - 0.07 K/uL    Comment: Performed at [facility], [address], [place], [place] [address]  Comprehensive metabolic panel     Status: Abnormal   Collection Time: 12/04/23  9:23 PM  Result Value Ref Range   Sodium 135 135 - 145 mmol/L   Potassium 4.0 3.5 - 5.1 mmol/L   Chloride 103 98 - 111 mmol/L   CO2 26 22 - 32 mmol/L   Glucose, Bld 94 70 - 99 mg/dL    Comment: Glucose reference range applies only to samples taken after fasting for at least 8 hours.   BUN 13 6 - 20 mg/dL   Creatinine, Ser 8.65 0.44 - 1.00 mg/dL   Calcium 9.3 8.9 - 78.4 mg/dL   Total Protein 7.6 6.5 - 8.1 g/dL   Albumin 3.9 3.5 - 5.0 g/dL   AST 20 15 - 41 U/L   ALT 14 0 - 44 U/L   Alkaline Phosphatase 160 (H) 38 - 126 U/L   Total Bilirubin 0.4 <1.2 mg/dL   GFR, Estimated >69 >62 mL/min    Comment: (NOTE) Calculated using the CKD-EPI Creatinine Equation (2021)    Anion gap 6 5 - 15    Comment: Performed at [facility], [address], [place], [place] [address]  Urinalysis, Routine w reflex microscopic -Urine, Clean Catch     Status: None   Collection Time: 12/04/23  9:23 PM  Result Value Ref Range   Color, Urine YELLOW YELLOW   APPearance CLEAR CLEAR   Specific Gravity, Urine 1.029 1.005 - 1.030   pH  6.0 5.0 - 8.0   Glucose, UA NEGATIVE NEGATIVE mg/dL   Hgb  urine dipstick NEGATIVE NEGATIVE   Bilirubin Urine NEGATIVE NEGATIVE   Ketones, ur NEGATIVE NEGATIVE mg/dL   Protein, ur NEGATIVE NEGATIVE mg/dL   Nitrite NEGATIVE NEGATIVE   Leukocytes,Ua NEGATIVE NEGATIVE    Comment: Performed at [facility], [address] [place], [place] [address]  Lipase, blood     Status: None   Collection Time: 12/04/23  9:23 PM  Result Value Ref Range   Lipase 25 11 - 51 U/L    Comment: Performed at [facility], [address] [place], [place] [address]   CT ABDOMEN PELVIS W CONTRAST Result Date: 12/05/2023 CLINICAL DATA:  Postoperative abdominal pain EXAM: CT ABDOMEN AND PELVIS WITH CONTRAST TECHNIQUE: Multidetector CT imaging of the abdomen and pelvis was performed using the standard protocol following bolus administration of intravenous contrast. RADIATION DOSE REDUCTION: This exam was performed according to the departmental dose-optimization program which includes automated exposure control, adjustment of the mA and/or kV according to patient size and/or use of iterative reconstruction technique. CONTRAST:  75mL OMNIPAQUE IOHEXOL 300 MG/ML  SOLN COMPARISON:  Earlier today FINDINGS: Lower chest:  Small hiatal hernia. Hepatobiliary: No significant liver finding.Tiny cysts are likely present in the upper central liver. Full gallbladder, no calcified stone or biliary dilatation. 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 9 mm cyst at the pancreatic tail, not clearly seen on 2020 chest CT comparison. No pancreatic ductal dilatation. Follow-up recommendations provided on preceding scan. Spleen: Unremarkable. Adrenals/Urinary Tract: Negative adrenals. No hydronephrosis or stone. Limited assessment of the bladder due to internal contrast with streak artifact. Stomach/Bowel: Redemonstrated cluster of small bowel loops in the left abdomen which have edematous mesentery. There is some  wasting of mesenteric vessels marked on 2:35, these loops could be encapsulated by an internal hernia. No bowel wall thickening, obstruction, or mesenteric vessel compromise. There has been prior gastric bypass. Vascular/Lymphatic: No acute vascular abnormality. Left IVC. No mass or adenopathy. Reproductive:No pathologic findings.  Corpus luteum on the right. Other: Small volume ascites in the left upper quadrant, likely reactive. Musculoskeletal: No acute abnormalities. IMPRESSION: 1. Mesenteric edema in the left upper quadrant affecting a cluster of small bowel loops, possibly related to an internal hernia or diffusion, but no luminal obstruction, mesenteric vessel compromise, or bowel thickening. Trace reactive ascites in the left upper quadrant. 2. Pancreatic cyst as described on the immediate preceding abdominal CT. Electronically Signed   By: Tiburcio Pea M.D.   On: 12/05/2023 05:28   CT ABDOMEN PELVIS W CONTRAST Result Date: 12/05/2023 CLINICAL DATA:  Upper abdominal pain.  History of bariatric surgery. EXAM: CT ABDOMEN AND PELVIS WITH CONTRAST TECHNIQUE: Multidetector CT imaging of the abdomen and pelvis was performed using the standard protocol following bolus administration of intravenous contrast. RADIATION DOSE REDUCTION: This exam was performed according to the departmental dose-optimization program which includes automated exposure control, adjustment of the mA and/or kV according to patient size and/or use of iterative reconstruction technique. CONTRAST:  OMNIPAQUE IOHEXOL 300 MG/ML  SOLN COMPARISON:  CT abdomen and pelvis 04/10/2016 FINDINGS: Lower chest: No acute abnormality. Hepatobiliary: Unremarkable liver. Normal gallbladder. No biliary dilation. Pancreas: 9 mm cystic lesion or lesions in the pancreatic tail is new since 2017. no ductal dilation or peripancreatic inflammation. Spleen: Unremarkable. Adrenals/Urinary Tract: Normal adrenal glands. No urinary calculi or hydronephrosis.  Bladder is unremarkable. Stomach/Bowel: Postoperative change gastric bypass. Normal caliber large and small bowel. Interloop edema within the jejunal mesentery in the left upper quadrant. Evaluation is mildly compromised by  paucity of intra-abdominal fat and lack of oral contrast. No definite bowel wall thickening. The appendix is normal. 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 No enlarged abdominal or pelvic lymph nodes. Reproductive: Physiologic appearance of the uterus and ovaries. Other: Pre interloop fluid in the small bowel mesentery in the left upper quadrant and in the pelvis. No free intraperitoneal air. Musculoskeletal: No acute fracture. IMPRESSION: 1. Interloop edema within the jejunal mesentery in the left upper quadrant and in the pelvis. Evaluation is mildly compromised by paucity of intra-abdominal fat and lack of oral contrast. Findings are nonspecific but can be seen in the setting of enteritis. If further evaluation is desired, repeat CT with oral and IV contrast could be considered. 2. 9 mm cystic lesion or lesions in the pancreatic tail is new since 2017. Recommend contrast-enhanced MRI or pancreas protocol CT in 1 year. Electronically Signed   By: Minerva Fester M.D.   On: 12/05/2023 01:38      Assessment/Plan Abdominal pain, Internal hernia, s/p lap sleeve converted to roux-en-y bypass with hiatal hernia repair in 2023 at [facility] The patient has been seen, examined, labs, vitals, chart, Care Everywhere, and imaging personally reviewed.  The patient does have a conglomeration of bowel in the LUQ with mesenteric edema that does appear concerning on her CT scan.  Her physical exam is consistent with this finding.  We will plan to proceed to the OR today for dx lap, possible laparotomy to evaluate this finding.  I have discussed this with the patient as well as her best friend who is at the bedside.  We discussed the procedure along with possible  risks and complications such as but not limited to bleeding, infection, need to resect bowel, convert to open procedure, etc.  She understands and agrees to proceed.  We will give a dose of Ancef on call to the OR and otherwise NPO for now.  FEN - NPO/IVFs VTE - lovenox to start tomorrow ID - Ancef on call Admit - obs, OR, then med-surg  Anxiety Migraines  I reviewed ED provider notes, last 24 h vitals and pain scores, last 24 h labs and trends, and last 24 h imaging results.I have reviewed care everywhere for her bariatric notes from primary team.  Letha Cape, [facility] Surgery 12/05/2023, 8:31 AM Please see Amion for pager number during day hours 7:00am-4:30pm or 7:00am -11:30am on weekends

## 2023-12-06 ENCOUNTER — Encounter (HOSPITAL_COMMUNITY): Payer: Self-pay | Admitting: Surgery

## 2023-12-06 DIAGNOSIS — K458 Other specified abdominal hernia without obstruction or gangrene: Secondary | ICD-10-CM | POA: Diagnosis not present

## 2023-12-06 LAB — BASIC METABOLIC PANEL
Anion gap: 9 (ref 5–15)
BUN: 6 mg/dL (ref 6–20)
CO2: 25 mmol/L (ref 22–32)
Calcium: 9.1 mg/dL (ref 8.9–10.3)
Chloride: 100 mmol/L (ref 98–111)
Creatinine, Ser: 0.77 mg/dL (ref 0.44–1.00)
GFR, Estimated: >60 mL/min (ref >60)
Glucose, Bld: 162 mg/dL — ABNORMAL HIGH (ref 70–99)
Potassium: 4.2 mmol/L (ref 3.5–5.1)
Sodium: 134 mmol/L — ABNORMAL LOW (ref 135–145)

## 2023-12-06 LAB — CBC
HCT: 40.5 % (ref 36.0–46.0)
Hemoglobin: 12.9 g/dL (ref 12.0–15.0)
MCH: 29.8 pg (ref 26.0–34.0)
MCHC: 31.9 g/dL (ref 30.0–36.0)
MCV: 93.5 fL (ref 80.0–100.0)
Platelets: 359 10*3/uL (ref 150–400)
RBC: 4.33 MIL/uL (ref 3.87–5.11)
RDW: 13.6 % (ref 11.5–15.5)
WBC: 16.5 10*3/uL — ABNORMAL HIGH (ref 4.0–10.5)
nRBC: 0 % (ref 0.0–0.2)

## 2023-12-06 MED ORDER — ADULT MULTIVITAMIN LIQUID CH
15.0000 mL | Freq: Every day | ORAL | Status: DC
  Filled 2023-12-06 (×2): qty 15

## 2023-12-06 MED ORDER — ACETAMINOPHEN 500 MG PO TABS
1000.0000 mg | ORAL_TABLET | Freq: Four times a day (QID) | ORAL | Status: DC
  Administered 2023-12-06 – 2023-12-07 (×3): 1000 mg via ORAL
  Filled 2023-12-06 (×4): qty 2

## 2023-12-06 MED ORDER — METHOCARBAMOL 500 MG PO TABS
500.0000 mg | ORAL_TABLET | Freq: Three times a day (TID) | ORAL | Status: DC
  Administered 2023-12-07: 500 mg via ORAL
  Filled 2023-12-06: qty 1

## 2023-12-06 MED ORDER — TAMSULOSIN HCL 0.4 MG PO CAPS
0.4000 mg | ORAL_CAPSULE | Freq: Every day | ORAL | Status: DC
  Administered 2023-12-06 – 2023-12-07 (×2): 0.4 mg via ORAL
  Filled 2023-12-06 (×2): qty 1

## 2023-12-06 MED ORDER — TRAMADOL HCL 50 MG PO TABS
50.0000 mg | ORAL_TABLET | Freq: Four times a day (QID) | ORAL | Status: DC | PRN
  Administered 2023-12-07: 50 mg via ORAL
  Filled 2023-12-06: qty 1

## 2023-12-06 MED ORDER — AMPHETAMINE-DEXTROAMPHET ER 30 MG PO CP24
30.0000 mg | ORAL_CAPSULE | Freq: Every day | ORAL | Status: DC
  Administered 2023-12-06 – 2023-12-07 (×2): 30 mg via ORAL
  Filled 2023-12-06 (×3): qty 1

## 2023-12-06 MED ORDER — METHOCARBAMOL 1000 MG/10ML IJ SOLN
500.0000 mg | Freq: Three times a day (TID) | INTRAMUSCULAR | Status: DC
  Administered 2023-12-06 (×3): 500 mg via INTRAVENOUS
  Filled 2023-12-06 (×4): qty 10

## 2023-12-06 NOTE — Progress Notes (Signed)
Patient urinated around 200 cc around 1100 and voiced concern about not voiding since then and also started to develop pressure and lower back pain this afternoon. Bladder scan read 327 mL. Notified Trixie Deis, PA who ordered an in and out cath. In and out cath attempted twice with CN Christy but unsuccessful both times. Paged on-call [facility] provider for guidance on next steps. Instructed to reattempt in and out cath before consulting urology. Oncoming nurse made aware and patient was given flomax and encouraged to try to void in the meantime.

## 2023-12-06 NOTE — Progress Notes (Signed)
   12/06/23 0935  TOC Brief Assessment  Insurance and Status Reviewed  Patient has primary care physician Yes  Home environment has been reviewed Resides with spouse and children  Prior level of function: Independent at baseline  Prior/Current Home Services No current home services  Social Drivers of Health Review SDOH reviewed no interventions necessary  Readmission risk has been reviewed Yes  Transition of care needs no transition of care needs at this time

## 2023-12-06 NOTE — Plan of Care (Signed)
  Problem: Activity: Goal: Risk for activity intolerance will decrease Outcome: Progressing   Problem: Elimination: Goal: Will not experience complications related to urinary retention Outcome: Progressing   

## 2023-12-06 NOTE — Anesthesia Postprocedure Evaluation (Signed)
Anesthesia Post Note  Patient: Jessica Banks  Procedure(s) Performed: LAPAROSCOPY DIAGNOSTIC, CHOLECYSTECOMY, INTERNAL HERNIA REPAIR     Patient location during evaluation: PACU Anesthesia Type: General Level of consciousness: awake and alert Pain management: pain level controlled Vital Signs Assessment: post-procedure vital signs reviewed and stable Respiratory status: spontaneous breathing, nonlabored ventilation and respiratory function stable Cardiovascular status: blood pressure returned to baseline Postop Assessment: no apparent nausea or vomiting Anesthetic complications: no   No notable events documented.        Shanda Howells

## 2023-12-06 NOTE — Progress Notes (Signed)
1 Day Post-Op  Subjective: Patient having hesitancy with voiding, but once she gets her stream going she is able to empty her bladder.  Mobilizing well.  Having pain.  Using her Kpad.  Hasn't really drank much.  Didn't know she had to order her diet.  ROS: See above, otherwise other systems negative  Objective: Vital signs in last 24 hours: Temp:  [96.9 F (36.1 C)-99.1 F (37.3 C)] 98.7 F (37.1 C) (12/19 0455) Pulse Rate:  [62-106] 99 (12/19 0455) Resp:  [16-28] 16 (12/19 0455) BP: (118-143)/(64-90) 138/84 (12/19 0455) SpO2:  [88 %-100 %] 98 % (12/19 0455) Last BM Date : 12/04/23  Intake/Output from previous day: 12/18 0701 - 12/19 0700 In: 2807.6 [P.O.:210; I.V.:1997.6; IV Piggyback:600] Out: 0  Intake/Output this shift: No intake/output data recorded.  PE: Gen: ambulating around the room, NAD Abd: soft, appropriately tender, incisions c/d/I with dermabond present, ND  Lab Results:  Recent Labs    12/04/23 2123 12/06/23 0513  WBC 7.5 16.5*  HGB 12.6 12.9  HCT 40.1 40.5  PLT 368 359   BMET Recent Labs    12/04/23 2123 12/06/23 0513  NA 135 134*  K 4.0 4.2  CL 103 100  CO2 26 25  GLUCOSE 94 162*  BUN 13 6  CREATININE 0.64 0.77  CALCIUM 9.3 9.1   PT/INR No results for input(s): "LABPROT", "INR" in the last 72 hours. CMP     Component Value Date/Time   NA 134 (L) 12/06/2023 0513   NA 138 09/23/2023 0000   K 4.2 12/06/2023 0513   CL 100 12/06/2023 0513   CO2 25 12/06/2023 0513   GLUCOSE 162 (H) 12/06/2023 0513   BUN 6 12/06/2023 0513   BUN 9 09/23/2023 0000   CREATININE 0.77 12/06/2023 0513   CREATININE 0.60 04/06/2016 1700   CALCIUM 9.1 12/06/2023 0513   PROT 7.6 12/04/2023 2123   PROT 6.9 03/28/2023 1000   ALBUMIN 3.9 12/04/2023 2123   ALBUMIN 4.2 03/28/2023 1000   AST 20 12/04/2023 2123   ALT 14 12/04/2023 2123   ALKPHOS 160 (H) 12/04/2023 2123   BILITOT 0.4 12/04/2023 2123   BILITOT <0.2 03/28/2023 1000   GFRNONAA >60 12/06/2023  0513   GFRNONAA >89 04/06/2016 1700   GFRAA >60 04/23/2019 0105   GFRAA >89 04/06/2016 1700   Lipase     Component Value Date/Time   LIPASE 25 12/04/2023 2123       Studies/Results: CT ABDOMEN PELVIS W CONTRAST Result Date: 12/05/2023 CLINICAL DATA:  Postoperative abdominal pain EXAM: CT ABDOMEN AND PELVIS WITH CONTRAST TECHNIQUE: Multidetector CT imaging of the abdomen and pelvis was performed using the standard protocol following bolus administration of intravenous contrast. RADIATION DOSE REDUCTION: This exam was performed according to the departmental dose-optimization program which includes automated exposure control, adjustment of the mA and/or kV according to patient size and/or use of iterative reconstruction technique. CONTRAST:  75mL OMNIPAQUE IOHEXOL 300 MG/ML  SOLN COMPARISON:  Earlier today FINDINGS: Lower chest:  Small hiatal hernia. Hepatobiliary: No significant liver finding.Tiny cysts are likely present in the upper central liver. Full gallbladder, no calcified stone or biliary dilatation. 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 9 mm cyst at the pancreatic tail, not clearly seen on 2020 chest CT comparison. No pancreatic ductal dilatation. Follow-up recommendations provided on preceding scan. Spleen: Unremarkable. Adrenals/Urinary Tract: Negative adrenals. No hydronephrosis or stone. Limited assessment of the bladder due to internal contrast with streak artifact. Stomach/Bowel: Redemonstrated cluster of small bowel  loops in the left abdomen which have edematous mesentery. There is some wasting of mesenteric vessels marked on 2:35, these loops could be encapsulated by an internal hernia. No bowel wall thickening, obstruction, or mesenteric vessel compromise. There has been prior gastric bypass. Vascular/Lymphatic: No acute vascular abnormality. Left IVC. No mass or adenopathy. Reproductive:No pathologic findings.  Corpus luteum on the right. Other: Small volume ascites in the left  upper quadrant, likely reactive. Musculoskeletal: No acute abnormalities. IMPRESSION: 1. Mesenteric edema in the left upper quadrant affecting a cluster of small bowel loops, possibly related to an internal hernia or diffusion, but no luminal obstruction, mesenteric vessel compromise, or bowel thickening. Trace reactive ascites in the left upper quadrant. 2. Pancreatic cyst as described on the immediate preceding abdominal CT. Electronically Signed   By: Tiburcio Pea M.D.   On: 12/05/2023 05:28   CT ABDOMEN PELVIS W CONTRAST Result Date: 12/05/2023 CLINICAL DATA:  Upper abdominal pain.  History of bariatric surgery. EXAM: CT ABDOMEN AND PELVIS WITH CONTRAST TECHNIQUE: Multidetector CT imaging of the abdomen and pelvis was performed using the standard protocol following bolus administration of intravenous contrast. RADIATION DOSE REDUCTION: This exam was performed according to the departmental dose-optimization program which includes automated exposure control, adjustment of the mA and/or kV according to patient size and/or use of iterative reconstruction technique. CONTRAST:  OMNIPAQUE IOHEXOL 300 MG/ML  SOLN COMPARISON:  CT abdomen and pelvis 04/10/2016 FINDINGS: Lower chest: No acute abnormality. Hepatobiliary: Unremarkable liver. Normal gallbladder. No biliary dilation. Pancreas: 9 mm cystic lesion or lesions in the pancreatic tail is new since 2017. no ductal dilation or peripancreatic inflammation. Spleen: Unremarkable. Adrenals/Urinary Tract: Normal adrenal glands. No urinary calculi or hydronephrosis. Bladder is unremarkable. Stomach/Bowel: Postoperative change gastric bypass. Normal caliber large and small bowel. Interloop edema within the jejunal mesentery in the left upper quadrant. Evaluation is mildly compromised by paucity of intra-abdominal fat and lack of oral contrast. No definite bowel wall thickening. The appendix is normal. 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 No enlarged abdominal or pelvic lymph nodes. Reproductive: Physiologic appearance of the uterus and ovaries. Other: Pre interloop fluid in the small bowel mesentery in the left upper quadrant and in the pelvis. No free intraperitoneal air. Musculoskeletal: No acute fracture. IMPRESSION: 1. Interloop edema within the jejunal mesentery in the left upper quadrant and in the pelvis. Evaluation is mildly compromised by paucity of intra-abdominal fat and lack of oral contrast. Findings are nonspecific but can be seen in the setting of enteritis. If further evaluation is desired, repeat CT with oral and IV contrast could be considered. 2. 9 mm cystic lesion or lesions in the pancreatic tail is new since 2017. Recommend contrast-enhanced MRI or pancreas protocol CT in 1 year. Electronically Signed   By: Minerva Fester M.D.   On: 12/05/2023 01:38    Anti-infectives: Anti-infectives (From admission, onward)    Start     Dose/Rate Route Frequency Ordered Stop   12/05/23 0830  ceFAZolin (ANCEF) IVPB 2g/100 mL premix        2 g 200 mL/hr over 30 Minutes Intravenous On call to O.R. 12/05/23 0829 12/05/23 1335        Assessment/Plan POD 1, s/p dx lap with reduction of internal hernia and closure of defect with cholecystectomy, Dr. Freida Busman 12/18 for internal hernia s/p RYGB and biliary colic -start multi-modal pain control -avoid NSAIDs due to bypass anatomy -resume multi-vitamins -bari CLD -mobilize, pulm toilet -suspect bladder hesitancy is just from  anesthesia and slowness of the bladder muscle right now.  UA was normal yesterday and she did not get a foley in the OR.  She is able to empty her bladder, just having a hard time getting her stream started.  Will monitor   FEN - bari CLD VTE - lovenox ID - none needed  Anxiety/Depression - home meds ordered.  Pharmacy asked to hold Adderall due to limited stock of it here.  May bring in home meds and given to pharmacy to give to  her.    LOS: 0 days    Letha Cape , [facility] 12/06/2023, 8:52 AM Please see Amion for pager number during day hours 7:00am-4:30pm or 7:00am -11:30am on weekends

## 2023-12-07 DIAGNOSIS — K458 Other specified abdominal hernia without obstruction or gangrene: Secondary | ICD-10-CM | POA: Diagnosis not present

## 2023-12-07 LAB — SURGICAL PATHOLOGY

## 2023-12-07 MED ORDER — TRAMADOL HCL 50 MG PO TABS: 50.0000 mg | ORAL_TABLET | Freq: Four times a day (QID) | ORAL | 0 refills | Status: DC | PRN

## 2023-12-07 MED ORDER — PANTOPRAZOLE SODIUM 40 MG PO TBEC: 40.0000 mg | DELAYED_RELEASE_TABLET | Freq: Every day | ORAL | Status: DC

## 2023-12-07 MED ORDER — ACETAMINOPHEN 500 MG PO TABS: 1000.0000 mg | ORAL_TABLET | Freq: Four times a day (QID) | ORAL | Status: DC | PRN

## 2023-12-07 MED ORDER — TAMSULOSIN HCL 0.4 MG PO CAPS: 0.4000 mg | ORAL_CAPSULE | Freq: Every day | ORAL | 0 refills | Status: DC

## 2023-12-07 NOTE — Progress Notes (Signed)
Patient is feeling much better today. Has some incisional pain but otherwise feeling well. Tolerating soft diet and ambulating. She has been voiding spontaneously overnight after receiving flomax, with no further urinary symptoms. She feels ready for discharge home today. Discharge instructions reviewed, all questions answered.  Sophronia Simas, MD [facility] General, Hepatobiliary and Pancreatic Surgery 12/07/23 9:38 AM

## 2023-12-07 NOTE — Progress Notes (Signed)
Discharge instructions given to patient and all questions were answered.  

## 2023-12-07 NOTE — Discharge Instructions (Signed)

## 2023-12-07 NOTE — Discharge Summary (Signed)
Patient ID: Jessica Banks [id number] 1981-09-12 42 y.o.  Admit date: 12/04/2023 Discharge date: 12/07/2023  Admitting Diagnosis: Internal hernia s/p RYGB Biliary colic  Discharge Diagnosis Patient Active Problem List   Diagnosis Date Noted   Internal hernia 12/05/2023   Hypoglycemia 11/20/2023   Elevated blood pressure reading 11/20/2023   Elevated alkaline phosphatase level 03/06/2021   Vitamin D deficiency disease 03/06/2021   Sciatica of right side 03/06/2021   Thyroid nodule 03/06/2021   Uterine myoma 12/06/2020   Unwanted fertility 11/26/2020   Obesity affecting pregnancy, antepartum 09/01/2020   Supervision of other normal pregnancy, antepartum 08/30/2020   Antepartum multigravida of advanced maternal age 06/29/2020   Near syncope 07/16/2017   Anxiety    Migraine    Obesity   Biliary colic  Consultants none  Reason for Admission: This is a 42 yo female with a history of obesity, 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 Since her bypass, her reflux has resolved. She has been doing well and following up with her bariatric team. She has recently been having some upper abdominal discomfort that was felt to possibly be secondary to her gb. She was placed on ursodiol to help with this. Her symptoms have actually improved with this until yesterday she developed an acute onset of LUQ abdominal pain. She had no other symptoms such as N/V/D, fevers, CP, SOB, etc. She is compliant with taking her multivitamin. She did have Toradol 60mg  given to her yesterday at work and this helped her pain, but then it returned once this wore off. She eventually came to the ED as she thought her gallbladder was acting up. Here she was noted to have normal labs, but a CT scan that is concerning for possible internal hernia in the LUQ, but no definitive bowel or vascular compromise at that  time. We have been asked to see this patient for further evaluation.   Procedures Dx lap with reduction of internal hernia, closure of defect, and cholecystectomy, Dr. Freida Busman 12/05/2023  Hospital Course:  The patient was admitted and underwent the above procedure.  She tolerated this well.  Her diet was slowly advanced and was tolerating a soft diet by POD 2.  She did have some urinary hesitancy.  She was started on Flomax to help with this at her request.  She was voiding spontaneously at discharge and never required a catheter.  She was otherwise mobilizing well with good pain control.  She was stable on POD 2 for DC home.  She will follow up in our office for a post op check, but also has follow up with her primary bariatric team as well.  Physical Exam: Abd: soft, appropriately tender, incisions c/d/i  Allergies as of 12/07/2023       Reactions   Novocain [procaine] Anaphylaxis   Povidone Iodine Rash   Rash        Medication List     STOP taking these medications    phentermine 15 MG capsule   ursodiol 300 MG capsule Commonly known as: ACTIGALL       TAKE these medications    acetaminophen 500 MG tablet Commonly known as: TYLENOL Take 2 tablets (1,000 mg total) by mouth every 6 (six) hours as needed.   amphetamine-dextroamphetamine 30 MG 24 hr capsule Commonly known as: ADDERALL XR Take 30 mg by mouth daily.   butalbital-acetaminophen-caffeine 50-325-40 MG tablet Commonly known  as: FIORICET Take 1 tablet by mouth 2 (two) times daily as needed. What changed:  how much to take when to take this reasons to take this   cyclobenzaprine 10 MG tablet Commonly known as: FLEXERIL Take 1 tablet (10 mg total) by mouth daily as needed. What changed: reasons to take this   DULoxetine 60 MG capsule Commonly known as: Cymbalta Take 1 capsule (60 mg total) by mouth daily. What changed: when to take this   Emgality 120 MG/ML Soaj Generic drug:  Galcanezumab-gnlm Inject 1 Pen into the skin every 30 (thirty) days.   FORTIFY PROBIOTIC WOMENS PO Take by mouth.   IRON PO Take 1 tablet by mouth at bedtime.   Melatonin 10 MG Caps Take 10 mg by mouth at bedtime.   multivitamin tablet Take 1 tablet by mouth at bedtime.   Nurtec 75 MG Tbdp Generic drug: Rimegepant Sulfate Take 1 tablet (75 mg total) by mouth daily as needed. What changed: reasons to take this   tamsulosin 0.4 MG Caps capsule Commonly known as: FLOMAX Take 1 capsule (0.4 mg total) by mouth daily for 5 days. Start taking on: December 08, 2023   temazepam 15 MG capsule Commonly known as: RESTORIL TAKE 1 CAPSULE BY MOUTH AT BEDTIME AS NEEDED FOR SLEEP What changed: See the new instructions.   traMADol 50 MG tablet Commonly known as: ULTRAM Take 1 tablet (50 mg total) by mouth every 6 (six) hours as needed (pain).   VITAMIN D PO Take 1 capsule by mouth at bedtime.          Follow-up Information     Fritzi Mandes, MD Follow up on 01/02/2024.   Specialty: General Surgery Why: 9:30am, Arrive 30 minutes prior to your appointment time, Please bring your insurance card and photo ID Contact information: [address] [place] [address] [place] [address] [phone]                 Signed: Barnetta Chapel, [facility] 12/07/2023, 10:12 AM Please see Amion for pager number during day hours 7:00am-4:30pm, 7-11:30am on Weekends

## 2023-12-10 ENCOUNTER — Telehealth: Payer: Self-pay

## 2023-12-10 ENCOUNTER — Ambulatory Visit
Admission: RE | Admit: 2023-12-10 | Discharge: 2023-12-10 | Disposition: A | Discharge status: Home / Self Care | Payer: BC Managed Care – PPO | Source: Ambulatory Visit

## 2023-12-10 VITALS — BP 112/73 | HR 100 | Temp 98.6°F | Resp 18

## 2023-12-10 DIAGNOSIS — R338 Other retention of urine: Secondary | ICD-10-CM

## 2023-12-10 DIAGNOSIS — N9989 Other postprocedural complications and disorders of genitourinary system: Secondary | ICD-10-CM | POA: Diagnosis not present

## 2023-12-10 DIAGNOSIS — N3001 Acute cystitis with hematuria: Secondary | ICD-10-CM | POA: Diagnosis not present

## 2023-12-10 LAB — POCT URINALYSIS DIP (MANUAL ENTRY)
Glucose, UA: 100 mg/dL — AB
Leukocytes, UA: NEGATIVE
Nitrite, UA: POSITIVE — AB
Protein Ur, POC: 100 mg/dL — AB
Spec Grav, UA: 1.025 (ref 1.010–1.025)
Urobilinogen, UA: 1 U/dL
pH, UA: 6 (ref 5.0–8.0)

## 2023-12-10 MED ORDER — NITROFURANTOIN MONOHYD MACRO 100 MG PO CAPS: 100.0000 mg | ORAL_CAPSULE | Freq: Two times a day (BID) | ORAL | 0 refills | Status: DC

## 2023-12-10 NOTE — ED Triage Notes (Signed)
Pt presents to UC for dysuria starting yesterday. Pt had abdominal surgery 5 days ago and states she began feeling urinary pressure 4 days ago. She has been taking flomax as prescribed and took azo and cystex last night.

## 2023-12-10 NOTE — Transitions of Care (Post Inpatient/ED Visit) (Signed)
   12/10/2023  Name: GABRYELLA ASLAN MRN: [id number] DOB: 14-Dec-1981  Today's TOC FU Call Status: Today's TOC FU Call Status:: Successful TOC FU Call Completed TOC FU Call Complete Date: 12/10/23 Patient's Name and Date of Birth confirmed.  Transition Care Management Follow-up Telephone Call Date of Discharge: 12/07/23 Discharge Facility: [facility]) Type of Discharge: Inpatient Admission Primary Inpatient Discharge Diagnosis:: Internal hernia How have you been since you were released from the hospital?: Same Any questions or concerns?: No  Items Reviewed: Did you receive and understand the discharge instructions provided?: Yes Medications obtained,verified, and reconciled?: Yes (Medications Reviewed) Any new allergies since your discharge?: No Dietary orders reviewed?: No Do you have support at home?: Yes People in Home: spouse, parent(s) Name of Support/Comfort Primary Source: alvin  Medications Reviewed Today: Medications Reviewed Today   Medications were not reviewed in this encounter     Home Care and Equipment/Supplies: Were Home Health Services Ordered?: No Any new equipment or medical supplies ordered?: No  Functional Questionnaire: Do you need assistance with bathing/showering or dressing?: Yes Do you need assistance with meal preparation?: Yes Do you need assistance with eating?: No Do you have difficulty maintaining continence: No Do you need assistance with getting out of bed/getting out of a chair/moving?: Yes Do you have difficulty managing or taking your medications?: No  Follow up appointments reviewed: PCP Follow-up appointment confirmed?: No MD Provider Line Number:[phone] Given: Yes Specialist Hospital Follow-up appointment confirmed?: Yes Date of Specialist follow-up appointment?: 12/27/23 Follow-Up Specialty Provider:: bariatric Do you need transportation to your follow-up appointment?: No Do you understand care options if your  condition(s) worsen?: Yes-patient verbalized understanding    SIGNATURE Lisabeth Devoid, CMA

## 2023-12-10 NOTE — ED Provider Notes (Signed)
[facility]    CSN: [id number] Arrival date & time: 12/10/23  1508      History   Chief Complaint Chief Complaint  Patient presents with   Urinary Retention    UTI - Entered by patient    HPI Jessica Banks is a 42 y.o. female.   42 year old female who presents to urgent care with complaints of significant suprapubic pressure and left lower back pain.  She has had suprapubic pressure since she had hernia repair on Wednesday last week.  She was diagnosed with postoperative urinary retention that was not associated with the catheter.  She was started on Flomax on Thursday.  She did not have the severe suprapubic pressure and left lower back pain until yesterday.  The pressure increases when she is urinating.  She denies any dysuria, urgency or frequency.  She has contacted her surgeon's office who recommended coming into urgent care for a urinalysis given the severity of the symptoms.  She denies fevers or chills.  She is having some difficulty having bowel movements associated with this as well.      Past Medical History:  Diagnosis Date   Abnormal Pap smear    CIN I   Anemia    Anxiety    Depression    off meds now, ok now   Enlarged thyroid gland    nromal function   GERD (gastroesophageal reflux disease)    Migraine    Obesity    Ovarian cyst    Vaginal Pap smear, abnormal    ok now    Patient Active Problem List   Diagnosis Date Noted   Internal hernia 12/05/2023   Hypoglycemia 11/20/2023   Elevated blood pressure reading 11/20/2023   Elevated alkaline phosphatase level 03/06/2021   Vitamin D deficiency disease 03/06/2021   Sciatica of right side 03/06/2021   Thyroid nodule 03/06/2021   Uterine myoma 12/06/2020   Unwanted fertility 11/26/2020   Obesity affecting pregnancy, antepartum 09/01/2020   Supervision of other normal pregnancy, antepartum 08/30/2020   Antepartum multigravida of advanced maternal age 47/13/2021   Near syncope  07/16/2017   Anxiety    Migraine    Obesity     Past Surgical History:  Procedure Laterality Date   GASTRIC BYPASS  08/14/2022   Revision of sleeve/hiatal hernia repair   LAPAROSCOPIC GASTRIC SLEEVE RESECTION  2017   LAPAROSCOPY N/A 12/05/2023   Procedure: LAPAROSCOPY DIAGNOSTIC, CHOLECYSTECOMY, INTERNAL HERNIA REPAIR;  Surgeon: Fritzi Mandes, MD;  Location: [facility];  Service: General;  Laterality: N/A;    OB History     Gravida  3   Para  2   Term  2   Preterm      AB  1   Living  2      SAB  1   IAB      Ectopic      Multiple  0   Live Births  2            Home Medications    Prior to Admission medications   Medication Sig Start Date End Date Taking? Authorizing Provider  nitrofurantoin, macrocrystal-monohydrate, (MACROBID) 100 MG capsule Take 1 capsule (100 mg total) by mouth 2 (two) times daily. 12/10/23  Yes Nyela Cortinas A, PA-C  tamsulosin (FLOMAX) 0.4 MG CAPS capsule Take 0.4 mg by mouth.   Yes [provider]  acetaminophen (TYLENOL) 500 MG tablet Take 2 tablets (1,000 mg total) by mouth every 6 (six) hours as needed.  12/07/23   Barnetta Chapel, PA-C  amphetamine-dextroamphetamine (ADDERALL XR) 30 MG 24 hr capsule Take 30 mg by mouth daily.    [provider]  butalbital-acetaminophen-caffeine (FIORICET) 50-325-40 MG tablet Take 1 tablet by mouth 2 (two) times daily as needed. Patient taking differently: Take 2 tablets by mouth daily as needed for headache or migraine. 11/19/23   Penumalli, Glenford Bayley, MD  cyclobenzaprine (FLEXERIL) 10 MG tablet Take 1 tablet (10 mg total) by mouth daily as needed. Patient taking differently: Take 10 mg by mouth daily as needed for muscle spasms. 11/19/23   Penumalli, Glenford Bayley, MD  DULoxetine (CYMBALTA) 60 MG capsule Take 1 capsule (60 mg total) by mouth daily. Patient taking differently: Take 60 mg by mouth at bedtime. 11/19/23   Penumalli, Glenford Bayley, MD  Ferrous Sulfate (IRON PO) Take 1 tablet by  mouth at bedtime.    [provider]  Galcanezumab-gnlm (EMGALITY) 120 MG/ML SOAJ Inject 1 Pen into the skin every 30 (thirty) days. 11/19/23   Penumalli, Glenford Bayley, MD  Melatonin 10 MG CAPS Take 10 mg by mouth at bedtime.    [provider]  Multiple Vitamin (MULTIVITAMIN) tablet Take 1 tablet by mouth at bedtime.    [provider]  Probiotic Product (FORTIFY PROBIOTIC WOMENS PO) Take by mouth.    [provider]  Rimegepant Sulfate (NURTEC) 75 MG TBDP Take 1 tablet (75 mg total) by mouth daily as needed. Patient taking differently: Take 75 mg by mouth daily as needed (migraines). 11/19/23   Penumalli, Glenford Bayley, MD  tamsulosin (FLOMAX) 0.4 MG CAPS capsule Take 1 capsule (0.4 mg total) by mouth daily for 5 days. 12/08/23 12/13/23  Barnetta Chapel, PA-C  temazepam (RESTORIL) 15 MG capsule TAKE 1 CAPSULE BY MOUTH AT BEDTIME AS NEEDED FOR SLEEP Patient taking differently: Take 15 mg by mouth at bedtime. 10/17/23   Dorothyann Peng, MD  traMADol (ULTRAM) 50 MG tablet Take 1 tablet (50 mg total) by mouth every 6 (six) hours as needed (pain). 12/07/23   Barnetta Chapel, PA-C  VITAMIN D PO Take 1 capsule by mouth at bedtime.    [provider]    Family History Family History  Problem Relation Age of Onset   Thyroid disease Mother        s/p thyroidectomy   Arthritis Mother    Leukemia Maternal Grandmother    Stroke Maternal Grandmother    Cancer Maternal Grandfather        lung   Heart disease Paternal Grandmother    COPD Paternal Grandfather    Hypertension Maternal Uncle    Diabetes Maternal Uncle     Social History Social History   Tobacco Use   Smoking status: Former    Current packs/day: 0.00    Types: Cigarettes    Start date: 06/07/2009    Quit date: 06/07/2013    Years since quitting: 10.5   Smokeless tobacco: Never   Tobacco comments:    only smoked on the weekends  Vaping Use   Vaping status: Never Used  Substance Use Topics    Alcohol use: Not Currently    Comment: SOCIAL    Drug use: No     Allergies   Novocain [procaine] and Povidone iodine   Review of Systems Review of Systems  Constitutional:  Negative for chills and fever.  HENT:  Negative for ear pain and sore throat.   Eyes:  Negative for pain and visual disturbance.  Respiratory:  Negative for cough and  shortness of breath.   Cardiovascular:  Negative for chest pain and palpitations.  Gastrointestinal:  Negative for abdominal pain and vomiting.  Genitourinary:  Positive for difficulty urinating. Negative for dysuria and hematuria.  Musculoskeletal:  Negative for arthralgias and back pain.  Skin:  Negative for color change and rash.  Neurological:  Negative for seizures and syncope.  All other systems reviewed and are negative.    Physical Exam Triage Vital Signs ED Triage Vitals  Encounter Vitals Group     BP 12/10/23 1526 112/73     Systolic BP Percentile --      Diastolic BP Percentile --      Pulse Rate 12/10/23 1526 100     Resp 12/10/23 1526 18     Temp 12/10/23 1526 98.6 F (37 C)     Temp Source 12/10/23 1526 Oral     SpO2 12/10/23 1526 98 %     Weight --      Height --      Head Circumference --      Peak Flow --      Pain Score 12/10/23 1517 8     Pain Loc --      Pain Education --      Exclude from Growth Chart --    No data found.  Updated Vital Signs BP 112/73 (BP Location: Left Arm)   Pulse 100   Temp 98.6 F (37 C) (Oral)   Resp 18   LMP 11/12/2023 (Approximate)   SpO2 98%   Visual Acuity Right Eye Distance:   Left Eye Distance:   Bilateral Distance:    Right Eye Near:   Left Eye Near:    Bilateral Near:     Physical Exam Vitals and nursing note reviewed.  Constitutional:      General: She is not in acute distress.    Appearance: She is well-developed.  HENT:     Head: Normocephalic and atraumatic.  Eyes:     Conjunctiva/sclera: Conjunctivae normal.  Cardiovascular:     Rate and Rhythm:  Normal rate and regular rhythm.     Heart sounds: No murmur heard. Pulmonary:     Effort: Pulmonary effort is normal. No respiratory distress.     Breath sounds: Normal breath sounds.  Abdominal:     General: Bowel sounds are normal.     Palpations: Abdomen is soft.     Tenderness: There is abdominal tenderness.     Comments: Abdomen is somewhat bloated with surgical sites that are clean, dry and intact.  No signs of infection on the incisions.  Musculoskeletal:        General: No swelling.     Cervical back: Neck supple.  Skin:    General: Skin is warm and dry.     Capillary Refill: Capillary refill takes less than 2 seconds.  Neurological:     Mental Status: She is alert.  Psychiatric:        Mood and Affect: Mood normal.      UC Treatments / Results  Labs (all labs ordered are listed, but only abnormal results are displayed) Labs Reviewed  POCT URINALYSIS DIP (MANUAL ENTRY) - Abnormal; Notable for the following components:      Result Value   Color, UA orange (*)    Clarity, UA cloudy (*)    Glucose, UA =100 (*)    Bilirubin, UA small (*)    Ketones, POC UA trace (5) (*)    Blood, UA trace-intact (*)  Protein Ur, POC =100 (*)    Nitrite, UA Positive (*)    All other components within normal limits    EKG   Radiology No results found.  Procedures Procedures (including critical care time)  Medications Ordered in UC Medications - No data to display  Initial Impression / Assessment and Plan / UC Course  I have reviewed the triage vital signs and the nursing notes.  Pertinent labs & imaging results that were available during my care of the patient were reviewed by me and considered in my medical decision making (see chart for details).     Acute cystitis with hematuria  Postoperative urinary retention   Urinalysis done and is positive for nitrite and blood. This is consistent with a urinary tract infection. We will treat with the following:  Macrobid  100 mg twice daily for 5 days. Monitor symptoms closely and if no improvement or if the difficulty urinating worsens, then contact surgery office. Drink plenty of water to stay hydrated. Return to urgent care or PCP if symptoms worsen or fail to resolve.    Final Clinical Impressions(s) / UC Diagnoses   Final diagnoses:  Acute cystitis with hematuria  Postoperative urinary retention     Discharge Instructions      Urinalysis done and is positive for nitrite. This is consistent with a urinary tract infection. We will treat with the following:  Macrobid 100 mg twice daily for 5 days. Monitor symptoms closely and if no improvement or if the difficulty urinating worsens, then contact surgery office. Drink plenty of water to stay hydrated. Return to urgent care or PCP if symptoms worsen or fail to resolve.       ED Prescriptions     Medication Sig Dispense Auth. Provider   nitrofurantoin, macrocrystal-monohydrate, (MACROBID) 100 MG capsule Take 1 capsule (100 mg total) by mouth 2 (two) times daily. 10 capsule Landis Martins, [place]      PDMP not reviewed this encounter.   Landis Martins, [place] 12/10/23 1555

## 2023-12-10 NOTE — Discharge Instructions (Addendum)
Urinalysis done and is positive for nitrite. This is consistent with a urinary tract infection. We will treat with the following:  Macrobid 100 mg twice daily for 5 days. Monitor symptoms closely and if no improvement or if the difficulty urinating worsens, then contact surgery office. Drink plenty of water to stay hydrated. Return to urgent care or PCP if symptoms worsen or fail to resolve.   Urinalysis    Component Value Date/Time   COLORURINE YELLOW 12/04/2023 2123   APPEARANCEUR CLEAR 12/04/2023 2123   LABSPEC 1.029 12/04/2023 2123   PHURINE 6.0 12/04/2023 2123   GLUCOSEU NEGATIVE 12/04/2023 2123   HGBUR NEGATIVE 12/04/2023 2123   BILIRUBINUR small (A) 12/10/2023 1528   BILIRUBINUR Negative 03/01/2020 1100   KETONESUR trace (5) (A) 12/10/2023 1528   KETONESUR NEGATIVE 12/04/2023 2123   PROTEINUR =100 (A) 12/10/2023 1528   PROTEINUR NEGATIVE 12/04/2023 2123   UROBILINOGEN 1.0 12/10/2023 1528   NITRITE Positive (A) 12/10/2023 1528   NITRITE NEGATIVE 12/04/2023 2123   LEUKOCYTESUR Negative 12/10/2023 1528   LEUKOCYTESUR NEGATIVE 12/04/2023 2123

## 2023-12-13 ENCOUNTER — Other Ambulatory Visit: Payer: Self-pay

## 2023-12-13 ENCOUNTER — Inpatient Hospital Stay (HOSPITAL_BASED_OUTPATIENT_CLINIC_OR_DEPARTMENT_OTHER)
Admission: EM | Admit: 2023-12-13 | Discharge: 2023-12-16 | DRG: 300 | Disposition: A | Discharge status: Home / Self Care | Payer: BC Managed Care – PPO | Attending: Internal Medicine | Admitting: Internal Medicine

## 2023-12-13 ENCOUNTER — Emergency Department (HOSPITAL_BASED_OUTPATIENT_CLINIC_OR_DEPARTMENT_OTHER): Payer: BC Managed Care – PPO

## 2023-12-13 ENCOUNTER — Encounter (HOSPITAL_BASED_OUTPATIENT_CLINIC_OR_DEPARTMENT_OTHER): Payer: Self-pay | Admitting: Emergency Medicine

## 2023-12-13 DIAGNOSIS — Z8261 Family history of arthritis: Secondary | ICD-10-CM

## 2023-12-13 DIAGNOSIS — I2699 Other pulmonary embolism without acute cor pulmonale: Secondary | ICD-10-CM | POA: Diagnosis present

## 2023-12-13 DIAGNOSIS — Z886 Allergy status to analgesic agent status: Secondary | ICD-10-CM

## 2023-12-13 DIAGNOSIS — T81718A Complication of other artery following a procedure, not elsewhere classified, initial encounter: Secondary | ICD-10-CM | POA: Diagnosis not present

## 2023-12-13 DIAGNOSIS — G43909 Migraine, unspecified, not intractable, without status migrainosus: Secondary | ICD-10-CM | POA: Diagnosis present

## 2023-12-13 DIAGNOSIS — Z7901 Long term (current) use of anticoagulants: Secondary | ICD-10-CM

## 2023-12-13 DIAGNOSIS — Z6837 Body mass index (BMI) 37.0-37.9, adult: Secondary | ICD-10-CM

## 2023-12-13 DIAGNOSIS — Z823 Family history of stroke: Secondary | ICD-10-CM

## 2023-12-13 DIAGNOSIS — Z888 Allergy status to other drugs, medicaments and biological substances status: Secondary | ICD-10-CM

## 2023-12-13 DIAGNOSIS — D75839 Thrombocytosis, unspecified: Secondary | ICD-10-CM | POA: Diagnosis present

## 2023-12-13 DIAGNOSIS — Z801 Family history of malignant neoplasm of trachea, bronchus and lung: Secondary | ICD-10-CM

## 2023-12-13 DIAGNOSIS — E871 Hypo-osmolality and hyponatremia: Secondary | ICD-10-CM | POA: Diagnosis present

## 2023-12-13 DIAGNOSIS — Z8349 Family history of other endocrine, nutritional and metabolic diseases: Secondary | ICD-10-CM

## 2023-12-13 DIAGNOSIS — K567 Ileus, unspecified: Secondary | ICD-10-CM | POA: Diagnosis present

## 2023-12-13 DIAGNOSIS — Z8249 Family history of ischemic heart disease and other diseases of the circulatory system: Secondary | ICD-10-CM

## 2023-12-13 DIAGNOSIS — K449 Diaphragmatic hernia without obstruction or gangrene: Secondary | ICD-10-CM | POA: Diagnosis not present

## 2023-12-13 DIAGNOSIS — Z87891 Personal history of nicotine dependence: Secondary | ICD-10-CM

## 2023-12-13 DIAGNOSIS — Z806 Family history of leukemia: Secondary | ICD-10-CM

## 2023-12-13 DIAGNOSIS — Z9049 Acquired absence of other specified parts of digestive tract: Secondary | ICD-10-CM | POA: Diagnosis not present

## 2023-12-13 DIAGNOSIS — E441 Mild protein-calorie malnutrition: Secondary | ICD-10-CM | POA: Diagnosis present

## 2023-12-13 DIAGNOSIS — D649 Anemia, unspecified: Secondary | ICD-10-CM | POA: Diagnosis present

## 2023-12-13 DIAGNOSIS — Z825 Family history of asthma and other chronic lower respiratory diseases: Secondary | ICD-10-CM

## 2023-12-13 DIAGNOSIS — K56600 Partial intestinal obstruction, unspecified as to cause: Secondary | ICD-10-CM | POA: Diagnosis not present

## 2023-12-13 DIAGNOSIS — Y838 Other surgical procedures as the cause of abnormal reaction of the patient, or of later complication, without mention of misadventure at the time of the procedure: Secondary | ICD-10-CM | POA: Diagnosis present

## 2023-12-13 DIAGNOSIS — F32A Depression, unspecified: Secondary | ICD-10-CM | POA: Diagnosis present

## 2023-12-13 DIAGNOSIS — K219 Gastro-esophageal reflux disease without esophagitis: Secondary | ICD-10-CM | POA: Diagnosis present

## 2023-12-13 DIAGNOSIS — Z87410 Personal history of cervical dysplasia: Secondary | ICD-10-CM

## 2023-12-13 DIAGNOSIS — R739 Hyperglycemia, unspecified: Secondary | ICD-10-CM | POA: Diagnosis present

## 2023-12-13 DIAGNOSIS — Z79899 Other long term (current) drug therapy: Secondary | ICD-10-CM

## 2023-12-13 DIAGNOSIS — Z884 Allergy status to anesthetic agent status: Secondary | ICD-10-CM

## 2023-12-13 DIAGNOSIS — Z9884 Bariatric surgery status: Secondary | ICD-10-CM

## 2023-12-13 DIAGNOSIS — R188 Other ascites: Secondary | ICD-10-CM | POA: Diagnosis not present

## 2023-12-13 DIAGNOSIS — Z833 Family history of diabetes mellitus: Secondary | ICD-10-CM

## 2023-12-13 DIAGNOSIS — E66812 Obesity, class 2: Secondary | ICD-10-CM | POA: Diagnosis present

## 2023-12-13 DIAGNOSIS — N390 Urinary tract infection, site not specified: Secondary | ICD-10-CM | POA: Diagnosis present

## 2023-12-13 DIAGNOSIS — F419 Anxiety disorder, unspecified: Secondary | ICD-10-CM | POA: Diagnosis present

## 2023-12-13 LAB — CBC
HCT: 33 % — ABNORMAL LOW (ref 36.0–46.0)
Hemoglobin: 10.5 g/dL — ABNORMAL LOW (ref 12.0–15.0)
MCH: 28.9 pg (ref 26.0–34.0)
MCHC: 31.8 g/dL (ref 30.0–36.0)
MCV: 90.9 fL (ref 80.0–100.0)
Platelets: 459 10*3/uL — ABNORMAL HIGH (ref 150–400)
RBC: 3.63 MIL/uL — ABNORMAL LOW (ref 3.87–5.11)
RDW: 13.5 % (ref 11.5–15.5)
WBC: 7 10*3/uL (ref 4.0–10.5)
nRBC: 0 % (ref 0.0–0.2)

## 2023-12-13 LAB — BASIC METABOLIC PANEL
Anion gap: 8 (ref 5–15)
BUN: 12 mg/dL (ref 6–20)
CO2: 28 mmol/L (ref 22–32)
Calcium: 8.9 mg/dL (ref 8.9–10.3)
Chloride: 103 mmol/L (ref 98–111)
Creatinine, Ser: 0.67 mg/dL (ref 0.44–1.00)
GFR, Estimated: >60 mL/min (ref >60)
Glucose, Bld: 137 mg/dL — ABNORMAL HIGH (ref 70–99)
Potassium: 3.8 mmol/L (ref 3.5–5.1)
Sodium: 139 mmol/L (ref 135–145)

## 2023-12-13 LAB — TROPONIN I (HIGH SENSITIVITY)
Troponin I (High Sensitivity): 3 ng/L (ref <18)
Troponin I (High Sensitivity): 3 ng/L (ref <18)

## 2023-12-13 MED ORDER — HYDROMORPHONE HCL 1 MG/ML IJ SOLN
1.0000 mg | Freq: Once | INTRAMUSCULAR | Status: AC
  Administered 2023-12-13: 1 mg via INTRAVENOUS
  Filled 2023-12-13: qty 1

## 2023-12-13 MED ORDER — SODIUM CHLORIDE 0.9 % IV BOLUS
1000.0000 mL | Freq: Once | INTRAVENOUS | Status: AC
  Administered 2023-12-13: 1000 mL via INTRAVENOUS

## 2023-12-13 MED ORDER — HEPARIN (PORCINE) 25000 UT/250ML-% IV SOLN
1600.0000 [IU]/h | INTRAVENOUS | Status: DC
  Administered 2023-12-13: 1100 [IU]/h via INTRAVENOUS
  Administered 2023-12-14 – 2023-12-15 (×2): 1600 [IU]/h via INTRAVENOUS
  Filled 2023-12-13 (×3): qty 250

## 2023-12-13 MED ORDER — ONDANSETRON HCL 4 MG/2ML IJ SOLN
4.0000 mg | Freq: Once | INTRAMUSCULAR | Status: AC
  Administered 2023-12-13: 4 mg via INTRAVENOUS
  Filled 2023-12-13: qty 2

## 2023-12-13 MED ORDER — HEPARIN BOLUS VIA INFUSION
4000.0000 [IU] | Freq: Once | INTRAVENOUS | Status: AC
  Administered 2023-12-13: 4000 [IU] via INTRAVENOUS

## 2023-12-13 MED ORDER — IOHEXOL 350 MG/ML SOLN
100.0000 mL | Freq: Once | INTRAVENOUS | Status: AC | PRN
  Administered 2023-12-13: 100 mL via INTRAVENOUS

## 2023-12-13 NOTE — ED Triage Notes (Signed)
Hernia surgery 12/04/2023 Sob and left leg/calf pain

## 2023-12-13 NOTE — Progress Notes (Signed)
ANTICOAGULATION CONSULT NOTE - Initial Consult  Pharmacy Consult for Heparin Indication: pulmonary embolus  Allergies  Allergen Reactions   Novocain [Procaine] Anaphylaxis   Povidone Iodine Rash    Rash    Patient Measurements:   Heparin Dosing Weight: 65.3 kg  Vital Signs: Temp: 98.1 F (36.7 C) (12/26 1707) Temp Source: Oral (12/26 1707) BP: 113/84 (12/26 1830) Pulse Rate: 100 (12/26 1830)  Labs: Recent Labs    12/13/23 1741  HGB 10.5*  HCT 33.0*  PLT 459*  CREATININE 0.67  TROPONINIHS 3    CrCl cannot be calculated (Unknown ideal weight.).   Medical History: Past Medical History:  Diagnosis Date   Abnormal Pap smear    CIN I   Anemia    Anxiety    Depression    off meds now, ok now   Enlarged thyroid gland    nromal function   GERD (gastroesophageal reflux disease)    Migraine    Obesity    Ovarian cyst    Vaginal Pap smear, abnormal    ok now    Medications:  (Not in a hospital admission)  Scheduled:  Infusions:  PRN:   Assessment: 5 yof with a history of recent hernia repair. Patient is presenting with pain in right lower extremity. Heparin per pharmacy consult placed for pulmonary embolus.  CTA w/ PE w/out evidence of RHS CT Ab. W/ SBO vs ileus  Patient is not on anticoagulation prior to arrival.  Hgb 10.5; plt 459  Goal of Therapy:  Heparin level 0.3-0.7 units/ml Monitor platelets by anticoagulation protocol: Yes   Plan:  Give IV heparin 4000 units bolus x 1 Start heparin infusion at 1100 units/hr Check anti-Xa level in 6 hours and daily while on heparin Continue to monitor H&H and platelets  Delmar Landau, PharmD, BCPS 12/13/2023 7:57 PM ED Clinical Pharmacist -  [phone]

## 2023-12-13 NOTE — ED Provider Notes (Signed)
[facility] AT [facility] Provider Note   CSN: [id number] Arrival date & time: 12/13/23  1647     History  Chief Complaint  Patient presents with   Shortness of Breath   Post-op Problem    Jessica Banks is a 42 y.o. female.  42 year old female with recent internal hernia repair presenting to the emergency department today with pain in her right lower extremity.  Patient states that he has been going now for the past few hours.  She states that she felt tight in the back of her right calf and noticed this while she was walking.  She states that as the day is gone on that she has had some chest tightness as well as some shortness of breath.  She came to the ER at that time for further evaluation.  The patient has been having abdominal pain mostly in the left upper quadrant now for the past few days.  She states that she did call the surgeon on-call earlier today and did not receive a call back.  She started having the shortness of breath so she came to the ER at that time for further evaluation.  She denies any hemoptysis.  States that she has been taking pain medications at home which was not helping with her abdominal pain.  She does report having daily bowel movements since leaving the hospital.   Shortness of Breath      Home Medications Prior to Admission medications   Medication Sig Start Date End Date Taking? Authorizing Provider  acetaminophen (TYLENOL) 500 MG tablet Take 2 tablets (1,000 mg total) by mouth every 6 (six) hours as needed. 12/07/23  Yes Barnetta Chapel, PA-C  amphetamine-dextroamphetamine (ADDERALL XR) 30 MG 24 hr capsule Take 30 mg by mouth daily.   Yes [provider]  butalbital-acetaminophen-caffeine (FIORICET) 50-325-40 MG tablet Take 1 tablet by mouth 2 (two) times daily as needed. Patient taking differently: Take 2 tablets by mouth daily as needed for headache or migraine. 11/19/23  Yes Penumalli, Glenford Bayley, MD   cyclobenzaprine (FLEXERIL) 10 MG tablet Take 1 tablet (10 mg total) by mouth daily as needed. Patient taking differently: Take 10 mg by mouth daily as needed for muscle spasms. 11/19/23  Yes Penumalli, Glenford Bayley, MD  DULoxetine (CYMBALTA) 60 MG capsule Take 1 capsule (60 mg total) by mouth daily. Patient taking differently: Take 60 mg by mouth at bedtime. 11/19/23  Yes Penumalli, Glenford Bayley, MD  Ferrous Sulfate (IRON PO) Take 1 tablet by mouth at bedtime.   Yes [provider]  Galcanezumab-gnlm (EMGALITY) 120 MG/ML SOAJ Inject 1 Pen into the skin every 30 (thirty) days. 11/19/23  Yes Penumalli, Glenford Bayley, MD  Melatonin 10 MG CAPS Take 10 mg by mouth at bedtime.   Yes [provider]  Multiple Vitamin (MULTIVITAMIN) tablet Take 1 tablet by mouth at bedtime.   Yes [provider]  nitrofurantoin, macrocrystal-monohydrate, (MACROBID) 100 MG capsule Take 1 capsule (100 mg total) by mouth 2 (two) times daily. 12/10/23  Yes White, Elizabeth A, PA-C  oxyCODONE (OXY IR/ROXICODONE) 5 MG immediate release tablet Take 5 mg by mouth every 6 (six) hours as needed for moderate pain (pain score 4-6). 12/10/23  Yes [provider]  Probiotic Product (FORTIFY PROBIOTIC WOMENS PO) Take 1 capsule by mouth at bedtime.   Yes [provider]  Rimegepant Sulfate (NURTEC) 75 MG TBDP Take 1 tablet (75 mg total) by mouth daily as needed. 11/19/23  Yes Penumalli,  Glenford Bayley, MD  temazepam (RESTORIL) 15 MG capsule TAKE 1 CAPSULE BY MOUTH AT BEDTIME AS NEEDED FOR SLEEP Patient taking differently: Take 15 mg by mouth at bedtime. 10/17/23  Yes Dorothyann Peng, MD  traMADol (ULTRAM) 50 MG tablet Take 1 tablet (50 mg total) by mouth every 6 (six) hours as needed (pain). 12/07/23  Yes Barnetta Chapel, PA-C  VITAMIN D PO Take 1 capsule by mouth at bedtime.   Yes [provider]      Allergies    Novocain [procaine], Nsaids, and Povidone iodine    Review of Systems   Review of Systems   Respiratory:  Positive for shortness of breath.   All other systems reviewed and are negative.   Physical Exam Updated Vital Signs BP 122/85 (BP Location: Right Arm)   Pulse 79   Temp 98.3 F (36.8 C) (Oral)   Resp 17   Ht 5' (1.524 m)   Wt 87.5 kg   LMP 11/12/2023 (Approximate)   SpO2 98%   BMI 37.67 kg/m  Physical Exam Vitals and nursing note reviewed.   Gen: NAD Eyes: PERRL, EOMI HEENT: no oropharyngeal swelling Neck: trachea midline Resp: clear to auscultation bilaterally Card: Tachycardic, no murmurs, rubs, or gallops Abd: Mildly distended, tender over the left upper quadrant with no guarding or rebound, surgical scars with no evidence of wound dehiscence or drainage noted Extremities: No reproducible right-sided calf tenderness noted Vascular: 2+ radial pulses bilaterally, 2+ DP pulses bilaterally Skin: no rashes Psyc: acting appropriately   ED Results / Procedures / Treatments   Labs (all labs ordered are listed, but only abnormal results are displayed) Labs Reviewed  BASIC METABOLIC PANEL - Abnormal; Notable for the following components:      Result Value   Glucose, Bld 137 (*)    All other components within normal limits  CBC - Abnormal; Notable for the following components:   RBC 3.63 (*)    Hemoglobin 10.5 (*)    HCT 33.0 (*)    Platelets 459 (*)    All other components within normal limits  HEPARIN LEVEL (UNFRACTIONATED) - Abnormal; Notable for the following components:   Heparin Unfractionated 0.16 (*)    All other components within normal limits  HEPARIN LEVEL (UNFRACTIONATED) - Abnormal; Notable for the following components:   Heparin Unfractionated 0.17 (*)    All other components within normal limits  COMPREHENSIVE METABOLIC PANEL - Abnormal; Notable for the following components:   Sodium 132 (*)    Calcium 8.8 (*)    Albumin 3.0 (*)    All other components within normal limits  CBC - Abnormal; Notable for the following components:   RBC  3.49 (*)    Hemoglobin 10.4 (*)    HCT 32.5 (*)    Platelets 476 (*)    All other components within normal limits  URINALYSIS, W/ REFLEX TO CULTURE (INFECTION SUSPECTED) - Abnormal; Notable for the following components:   APPearance HAZY (*)    Hgb urine dipstick SMALL (*)    Leukocytes,Ua LARGE (*)    Bacteria, UA RARE (*)    All other components within normal limits  COMPREHENSIVE METABOLIC PANEL - Abnormal; Notable for the following components:   Albumin 2.8 (*)    All other components within normal limits  CBC - Abnormal; Notable for the following components:   RBC 3.65 (*)    Hemoglobin 10.5 (*)    HCT 34.6 (*)    Platelets 440 (*)    All other  components within normal limits  URINE CULTURE  MAGNESIUM  PHOSPHORUS  HEPARIN LEVEL (UNFRACTIONATED)  HEPARIN LEVEL (UNFRACTIONATED)  TROPONIN I (HIGH SENSITIVITY)  TROPONIN I (HIGH SENSITIVITY)    EKG EKG Interpretation Date/Time:  Thursday December 13 2023 17:32:09 EST Ventricular Rate:  106 PR Interval:  138 QRS Duration:  85 QT Interval:  343 QTC Calculation: 456 R Axis:   22  Text Interpretation: Sinus tachycardia Borderline T wave abnormalities Confirmed by Beckey Downing [phone]) on 12/13/2023 6:09:23 PM  Radiology VAS [place] LOWER EXTREMITY VENOUS (DVT) Result Date: 12/14/2023  Lower Venous DVT Study Patient Name:  Jessica Banks  Date of Exam:   12/14/2023 Medical Rec #: [id number]            Accession #:    [id number] Date of Birth: 1980-12-24            Patient Gender: F Patient Age:   89 years Exam Location:  [facility] Procedure:      VAS [place] LOWER EXTREMITY VENOUS (DVT) Referring Phys: DAVID ORTIZ --------------------------------------------------------------------------------  Indications: Pulmonary embolism.  Risk Factors: Confirmed PE. Anticoagulation: Heparin. Limitations: Poor ultrasound/tissue interface. Comparison Study: No prior studies. Performing Technologist: Chanda Busing RVT  Examination  Guidelines: A complete evaluation includes B-mode imaging, spectral Doppler, color Doppler, and power Doppler as needed of all accessible portions of each vessel. Bilateral testing is considered an integral part of a complete examination. Limited examinations for reoccurring indications may be performed as noted. The reflux portion of the exam is performed with the patient in reverse Trendelenburg.  +---------+---------------+---------+-----------+----------+--------------+ RIGHT    CompressibilityPhasicitySpontaneityPropertiesThrombus Aging +---------+---------------+---------+-----------+----------+--------------+ CFV      Full           Yes      Yes                                 +---------+---------------+---------+-----------+----------+--------------+ SFJ      Full                                                        +---------+---------------+---------+-----------+----------+--------------+ FV Prox  Full                                                        +---------+---------------+---------+-----------+----------+--------------+ FV Mid   Full                                                        +---------+---------------+---------+-----------+----------+--------------+ FV DistalFull                                                        +---------+---------------+---------+-----------+----------+--------------+ PFV      Full                                                        +---------+---------------+---------+-----------+----------+--------------+  POP      Full           Yes      Yes                                 +---------+---------------+---------+-----------+----------+--------------+ PTV      Full                                                        +---------+---------------+---------+-----------+----------+--------------+ PERO     Full                                                         +---------+---------------+---------+-----------+----------+--------------+   +---------+---------------+---------+-----------+----------+--------------+ LEFT     CompressibilityPhasicitySpontaneityPropertiesThrombus Aging +---------+---------------+---------+-----------+----------+--------------+ CFV      Full           Yes      Yes                                 +---------+---------------+---------+-----------+----------+--------------+ SFJ      Full                                                        +---------+---------------+---------+-----------+----------+--------------+ FV Prox  Full                                                        +---------+---------------+---------+-----------+----------+--------------+ FV Mid   Full                                                        +---------+---------------+---------+-----------+----------+--------------+ FV DistalFull           Yes      Yes                                 +---------+---------------+---------+-----------+----------+--------------+ PFV      Full                                                        +---------+---------------+---------+-----------+----------+--------------+ POP      Full           Yes      Yes                                 +---------+---------------+---------+-----------+----------+--------------+  PTV      Full                                                        +---------+---------------+---------+-----------+----------+--------------+ PERO     Full                                                        +---------+---------------+---------+-----------+----------+--------------+     Summary: RIGHT: - There is no evidence of deep vein thrombosis in the lower extremity.  - No cystic structure found in the popliteal fossa.  LEFT: - There is no evidence of deep vein thrombosis in the lower extremity.  - No cystic structure found in the popliteal fossa.   *See table(s) above for measurements and observations. Electronically signed by Heath Lark on 12/14/2023 at 9:21:06 PM.    Final    ECHOCARDIOGRAM COMPLETE Result Date: 12/14/2023    ECHOCARDIOGRAM REPORT   Patient Name:   Jessica Banks Date of Exam: 12/14/2023 Medical Rec #:  [id number]           Height:       60.0 in Accession #:    [id number]          Weight:       187.0 lb Date of Birth:  Apr 26, 1981           BSA:          1.814 m Patient Age:    42 years            BP:           107/48 mmHg Patient Gender: F                   HR:           82 bpm. Exam Location:  Inpatient Procedure: 2D Echo, Cardiac Doppler and Color Doppler Indications:    I26.02 Pulmonary embolus  History:        Patient has prior history of Echocardiogram examinations, most                 recent 07/27/2017. Signs/Symptoms:Syncope and Edema; Risk                 Factors:Former Smoker.  Sonographer:    Sheralyn Boatman RDCS Referring Phys: [id number] DAVID MANUEL ORTIZ  Sonographer Comments: Patient had recent abdominal surgery. IMPRESSIONS  1. Left ventricular ejection fraction, by estimation, is 55 to 60%. The left ventricle has normal function. The left ventricle has no regional wall motion abnormalities. Left ventricular diastolic parameters were normal.  2. Right ventricular systolic function is normal. The right ventricular size is normal.  3. The mitral valve is normal in structure. Trivial mitral valve regurgitation. No evidence of mitral stenosis.  4. The aortic valve is tricuspid. Aortic valve regurgitation is not visualized. No aortic stenosis is present.  5. The inferior vena cava is normal in size with greater than 50% respiratory variability, suggesting right atrial pressure of 3 mmHg. Comparison(s): No prior Echocardiogram. Conclusion(s)/Recommendation(s): No evidence of right heart strain. FINDINGS  Left Ventricle: Left ventricular ejection fraction, by estimation, is 55 to 60%. The left  ventricle has normal function. The  left ventricle has no regional wall motion abnormalities. The left ventricular internal cavity size was normal in size. There is  no left ventricular hypertrophy. Left ventricular diastolic parameters were normal. Right Ventricle: The right ventricular size is normal. Right ventricular systolic function is normal. Left Atrium: Left atrial size was normal in size. Right Atrium: Right atrial size was normal in size. Pericardium: There is no evidence of pericardial effusion. Mitral Valve: The mitral valve is normal in structure. Trivial mitral valve regurgitation. No evidence of mitral valve stenosis. Tricuspid Valve: The tricuspid valve is normal in structure. Tricuspid valve regurgitation is trivial. No evidence of tricuspid stenosis. Aortic Valve: The aortic valve is tricuspid. Aortic valve regurgitation is not visualized. No aortic stenosis is present. Pulmonic Valve: The pulmonic valve was normal in structure. Pulmonic valve regurgitation is not visualized. No evidence of pulmonic stenosis. Aorta: The aortic root and ascending aorta are structurally normal, with no evidence of dilitation. Venous: The inferior vena cava is normal in size with greater than 50% respiratory variability, suggesting right atrial pressure of 3 mmHg. IAS/Shunts: No atrial level shunt detected by color flow Doppler.  LEFT VENTRICLE PLAX 2D LVIDd:         4.10 cm      Diastology LVIDs:         2.80 cm      LV e' medial:    10.40 cm/s LV PW:         1.00 cm      LV E/e' medial:  8.0 LV IVS:        1.10 cm      LV e' lateral:   15.10 cm/s LVOT diam:     2.20 cm      LV E/e' lateral: 5.5 LV SV:         91 LV SV Index:   50 LVOT Area:     3.80 cm  LV Volumes (MOD) LV vol d, MOD A2C: 108.0 ml LV vol d, MOD A4C: 95.6 ml LV vol s, MOD A2C: 44.1 ml LV vol s, MOD A4C: 38.7 ml LV SV MOD A2C:     63.9 ml LV SV MOD A4C:     95.6 ml LV SV MOD BP:      60.5 ml RIGHT VENTRICLE             IVC RV S prime:     14.10 cm/s  IVC diam: 1.50 cm TAPSE (M-mode):  2.3 cm LEFT ATRIUM             Index        RIGHT ATRIUM          Index LA diam:        4.20 cm 2.32 cm/m   RA Area:     8.25 cm LA Vol (A2C):   30.2 ml 16.65 ml/m  RA Volume:   14.60 ml 8.05 ml/m LA Vol (A4C):   41.5 ml 22.88 ml/m LA Biplane Vol: 35.7 ml 19.68 ml/m  AORTIC VALVE LVOT Vmax:   130.00 cm/s LVOT Vmean:  85.700 cm/s LVOT VTI:    0.240 m  AORTA Ao Root diam: 2.60 cm Ao Asc diam:  2.70 cm MITRAL VALVE MV Area (PHT): 5.02 cm    SHUNTS MV Decel Time: 151 msec    Systemic VTI:  0.24 m MV E velocity: 83.20 cm/s  Systemic Diam: 2.20 cm MV A velocity: 61.20 cm/s MV E/A ratio:  1.36 Arlys John  Crenshaw MD Electronically signed by Olga Millers MD Signature Date/Time: 12/14/2023/3:04:04 PM    Final    CT Angio Chest PE W and/or Wo Contrast Result Date: 12/13/2023 CLINICAL DATA:  Shortness of breath with left lower extremity pain and suprapubic pressure. EXAM: CT ANGIOGRAPHY CHEST WITH CONTRAST TECHNIQUE: Multidetector CT imaging of the chest was performed using the standard protocol during bolus administration of intravenous contrast. Multiplanar CT image reconstructions and MIPs were obtained to evaluate the vascular anatomy. RADIATION DOSE REDUCTION: This exam was performed according to the departmental dose-optimization program which includes automated exposure control, adjustment of the mA and/or kV according to patient size and/or use of iterative reconstruction technique. CONTRAST:  OMNIPAQUE IOHEXOL 350 MG/ML SOLN COMPARISON:  Apr 23, 2019 FINDINGS: Cardiovascular: The thoracic aorta is unremarkable. Satisfactory opacification of the pulmonary arteries to the segmental level. A mild amount of intraluminal low attenuation is seen involving multiple posterior upper lobe branches of the right pulmonary artery. Normal heart size. No right heart strain is identified (RV/LV ratio of 0.98). No pericardial effusion. Mediastinum/Nodes: No enlarged mediastinal, hilar, or axillary lymph nodes. Thyroid  gland, trachea, and esophagus demonstrate no significant findings. Lungs/Pleura: Mild linear scarring and/or atelectasis is seen within the bilateral lung bases, left greater than right. No pleural effusion or pneumothorax is identified. Upper Abdomen: There is a small hiatal hernia with numerous surgical sutures seen throughout the gastric region. Musculoskeletal: No chest wall abnormality. No acute or significant osseous findings. Review of the MIP images confirms the above findings. IMPRESSION: 1. Mild amount of pulmonary embolism involving multiple upper lobe branches of the right pulmonary artery. 2. No evidence of associated right heart strain. 3. Mild bibasilar linear scarring and/or atelectasis, left greater than right. 4. 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 Electronically Signed   By: Aram Candela M.D.   On: 12/13/2023 19:39   CT ABDOMEN PELVIS W CONTRAST Result Date: 12/13/2023 CLINICAL DATA:  Suprapubic pressure and left lower back pain. EXAM: CT ABDOMEN AND PELVIS WITH CONTRAST TECHNIQUE: Multidetector CT imaging of the abdomen and pelvis was performed using the standard protocol following bolus administration of intravenous contrast. RADIATION DOSE REDUCTION: This exam was performed according to the departmental dose-optimization program which includes automated exposure control, adjustment of the mA and/or kV according to patient size and/or use of iterative reconstruction technique. CONTRAST:  OMNIPAQUE IOHEXOL 350 MG/ML SOLN COMPARISON:  December 05, 2023 FINDINGS: Lower chest: Mild atelectasis is seen within the bilateral lung bases. Hepatobiliary: No focal liver abnormality is seen. Status post cholecystectomy. No biliary dilatation. Pancreas: Unremarkable. No pancreatic ductal dilatation or surrounding inflammatory changes. Spleen: Normal in size without focal abnormality. Adrenals/Urinary Tract: Adrenal glands are unremarkable. Kidneys are normal,  without renal calculi, focal lesion, or hydronephrosis. The urinary bladder is poorly distended and subsequently limited in evaluation. Stomach/Bowel: There is a small hiatal hernia. Surgical sutures are seen throughout the gastric region with surgically anastomosed bowel also noted within the anterior aspect of the mid abdomen. Appendix appears normal. Multiple dilated loops of air and fluid filled jejunum are seen (maximum small bowel diameter of approximately 4.1 cm). A clear transition zone is not identified. Vascular/Lymphatic: No significant vascular findings are present. No enlarged abdominal or pelvic lymph nodes. Reproductive: The uterus is heterogeneous in appearance. A 2.1 cm diameter tortuous fluid-filled tubular appearing structure is seen along the posterior aspect of the left adnexa. This represents a new finding when compared to the prior exam. Other: No abdominal wall  hernia or abnormality. No abdominopelvic ascites. Musculoskeletal: No acute or significant osseous findings. IMPRESSION: 1. Findings consistent with a partial small bowel obstruction versus ileus. 2. Findings which may represent a left-sided hydrosalpinx. Correlation with pelvic ultrasound is recommended. 3. Evidence of prior cholecystectomy. 4. Small hiatal hernia. Electronically Signed   By: Aram Candela M.D.   On: 12/13/2023 19:32    Procedures Procedures    Medications Ordered in ED Medications  Chlorhexidine Gluconate Cloth 2 % PADS 6 each (6 each Topical Given 12/14/23 1110)  Oral care mouth rinse (has no administration in time range)  acetaminophen (TYLENOL) tablet 650 mg (has no administration in time range)    Or  acetaminophen (TYLENOL) suppository 650 mg (has no administration in time range)  ondansetron (ZOFRAN) tablet 4 mg (has no administration in time range)    Or  ondansetron (ZOFRAN) injection 4 mg (has no administration in time range)  HYDROmorphone (DILAUDID) injection 1 mg (1 mg Intravenous  Given 12/15/23 1252)  pantoprazole (PROTONIX) injection 40 mg (40 mg Intravenous Given 12/15/23 0947)  senna-docusate (Senokot-S) tablet 1 tablet (1 tablet Oral Given 12/15/23 0933)  cefTRIAXone (ROCEPHIN) 1 g in sodium chloride 0.9 % 100 mL IVPB (0 g Intravenous Stopped 12/15/23 1040)  butalbital-acetaminophen-caffeine (FIORICET) 50-325-40 MG per tablet 2 tablet (has no administration in time range)  DULoxetine (CYMBALTA) DR capsule 60 mg (60 mg Oral Given 12/14/23 2213)  polyethylene glycol (MIRALAX / GLYCOLAX) packet 17 g (17 g Oral Given 12/15/23 0934)  lactated ringers bolus 1,000 mL (has no administration in time range)  prochlorperazine (COMPAZINE) injection 5-10 mg (has no administration in time range)  phenol (CHLORASEPTIC) mouth spray 2 spray (has no administration in time range)  menthol-cetylpyridinium (CEPACOL) lozenge 3 mg (has no administration in time range)  magic mouthwash (has no administration in time range)  alum & mag hydroxide-simeth (MAALOX/MYLANTA) 200-200-20 MG/5ML suspension 30 mL (has no administration in time range)  simethicone (MYLICON) 40 MG/0.6ML suspension 80 mg (has no administration in time range)  polycarbophil (FIBERCON) tablet 625 mg (625 mg Oral Given 12/15/23 1124)  bisacodyl (DULCOLAX) suppository 10 mg (has no administration in time range)  sodium chloride (OCEAN) 0.65 % nasal spray 1-2 spray (has no administration in time range)  naphazoline-glycerin (CLEAR EYES REDNESS) ophth solution 1-2 drop (has no administration in time range)  methocarbamol (ROBAXIN) injection 1,000 mg (has no administration in time range)    Or  methocarbamol (ROBAXIN) tablet 1,000 mg (has no administration in time range)  oxyCODONE-acetaminophen (PERCOCET/ROXICET) 5-325 MG per tablet 1 tablet (has no administration in time range)  apixaban (ELIQUIS) tablet 10 mg (10 mg Oral Given 12/15/23 0933)    Followed by  apixaban (ELIQUIS) tablet 5 mg (has no administration in time  range)  HYDROmorphone (DILAUDID) injection 1 mg (1 mg Intravenous Given 12/13/23 1842)  ondansetron (ZOFRAN) injection 4 mg (4 mg Intravenous Given 12/13/23 1842)  sodium chloride 0.9 % bolus 1,000 mL (0 mLs Intravenous Stopped 12/13/23 2132)  iohexol (OMNIPAQUE) 350 MG/ML injection 100 mL (100 mLs Intravenous Contrast Given 12/13/23 1908)  heparin bolus via infusion 4,000 Units (4,000 Units Intravenous Bolus from Bag 12/13/23 2015)  heparin bolus via infusion 2,000 Units (2,000 Units Intravenous Bolus from Bag 12/14/23 0548)  heparin bolus via infusion 4,000 Units (4,000 Units Intravenous Bolus from Bag 12/14/23 1504)    ED Course/ Medical Decision Making/ A&P  Medical Decision Making 42 year old female with recent internal hernia repair presenting to the emergency department today with abdominal pain, right calf pain, and shortness of breath.  I will further evaluate the patient here with a CT angiogram of her chest to evaluate for pulmonary embolism.  Also obtain a CT scan of her abdomen pelvis to evaluate for obstruction or postsurgical complication.  We do not have ultrasound here at this time.  Will give the patient Dilaudid and Zofran for her symptoms.  Will give her IV fluids.  Also obtain an EKG and troponin to eval for any right heart strain or primary cardiac etiology although suspicion for this is relatively low.  The patient was found to have small pulmonary embolism as well as a partial small bowel obstruction.  I did call and discussed this with surgery.  They recommended having the patient sent to [facility] for evaluation but with no vomiting recommended against NG tube at this time.  She is admitted to the hospitalist service.  The patient started on heparin in the event there may be surgical intervention needed.  CRITICAL CARE Performed by: Durwin Glaze   Total critical care time: 40 minutes  Critical care time was exclusive of separately  billable procedures and treating other patients.  Critical care was necessary to treat or prevent imminent or life-threatening deterioration.  Critical care was time spent personally by me on the following activities: development of treatment plan with patient and/or surrogate as well as nursing, discussions with consultants, evaluation of patient's response to treatment, examination of patient, obtaining history from patient or surrogate, ordering and performing treatments and interventions, ordering and review of laboratory studies, ordering and review of radiographic studies, pulse oximetry and re-evaluation of patient's condition.   Amount and/or Complexity of Data Reviewed Labs: ordered. Radiology: ordered.  Risk Prescription drug management. Decision regarding hospitalization.           Final Clinical Impression(s) / ED Diagnoses Final diagnoses:  Partial small bowel obstruction (HCC)  Other acute pulmonary embolism, unspecified whether acute cor pulmonale present [facility])    Rx / DC Orders ED Discharge Orders     None         Durwin Glaze, MD 12/15/23 [phone]

## 2023-12-13 NOTE — Progress Notes (Signed)
Plan of Care Note for accepted transfer   Patient: Jessica Banks MRN: [id number]   DOA: 12/13/2023  Facility requesting transfer: [facility] Requesting Provider: Beckey Downing, MD Reason for transfer: Acute PE and partial small bowel obstruction Facility course: Jessica Banks is a 42 y.o. female with recent internal hernia repair on 12/18 presenting to the emergency department today with pain in her right lower extremity.  Patient states that he has been going now for the past few hours.  She states that she felt tight in the back of her right calf and noticed this while she was walking.  She states that as the day is gone on that she has had some chest tightness as well as some shortness of breath.  She came to the ER at that time for further evaluation.  The patient has been having abdominal pain mostly in the left upper quadrant now for the past few days.  She states that she did call the surgeon on-call earlier today and did not receive a call back.  She started having the shortness of breath so she came to the ER at that time for further evaluation.  She denies any hemoptysis.  States that she has been taking pain medications at home which was not helping with her abdominal pain.  She does report having daily bowel movements since leaving the hospital.  Upon arrival to the ER, heart rate was 113 with respiratory to 24 and otherwise normal vital signs.  Labs revealed unremarkable BMP.  High sensitive troponin I was 3 twice.  CBC showed anemia with hemoglobin 10.5 and hematocrit 33 compared to normal levels on 12/19.  EKG showed sinus tachycardia with rate of 106 with T wave inversion inferiorly.  CT of the abdomen and pelvis with contrast revealed . Findings consistent with a partial small bowel obstruction versus ileus. 2. Findings which may represent a left-sided hydrosalpinx. Correlation with pelvic ultrasound is recommended. 3. Evidence of prior cholecystectomy. 4. Small hiatal  hernia.  CT of the chest revealed: 1. Mild amount of pulmonary embolism involving multiple upper lobe branches of the right pulmonary artery. 2. No evidence of associated right heart strain. 3. Mild bibasilar linear scarring and/or atelectasis, left greater than right. 4. 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  Patient was given IV from bolus and drip, 4 mg of IV Zofran and 1 mg of IV Dilaudid, 1 L bolus of IV normal saline and Dr. Cliffton Asters with surgery was contacted about the patient and will consult for her partial small bowel obstruction.  She will be kept NPO.  Plan of care: The patient is accepted for admission to Progressive unit, at [facility] for further management of acute pulm embolism and partial small bowel obstruction.  The patient be under the care and responsibility of the ED physician until arrival to [facility].  Author: Hannah Beat, MD 12/13/2023  Check [email or web address] for on-call coverage.  Nursing staff, Please call [facility] Admits & Consults System-Wide number on Amion as soon as patient's arrival, so appropriate admitting provider can evaluate the pt.

## 2023-12-13 NOTE — ED Notes (Signed)
ED Provider at bedside, speaking to patient about CT results and admission plans

## 2023-12-14 ENCOUNTER — Inpatient Hospital Stay (HOSPITAL_COMMUNITY): Payer: BC Managed Care – PPO

## 2023-12-14 DIAGNOSIS — Z87410 Personal history of cervical dysplasia: Secondary | ICD-10-CM | POA: Diagnosis not present

## 2023-12-14 DIAGNOSIS — F32A Depression, unspecified: Secondary | ICD-10-CM | POA: Diagnosis present

## 2023-12-14 DIAGNOSIS — Z79899 Other long term (current) drug therapy: Secondary | ICD-10-CM | POA: Diagnosis not present

## 2023-12-14 DIAGNOSIS — R739 Hyperglycemia, unspecified: Secondary | ICD-10-CM | POA: Diagnosis present

## 2023-12-14 DIAGNOSIS — E871 Hypo-osmolality and hyponatremia: Secondary | ICD-10-CM | POA: Diagnosis present

## 2023-12-14 DIAGNOSIS — G4700 Insomnia, unspecified: Secondary | ICD-10-CM | POA: Insufficient documentation

## 2023-12-14 DIAGNOSIS — I2609 Other pulmonary embolism with acute cor pulmonale: Secondary | ICD-10-CM | POA: Diagnosis not present

## 2023-12-14 DIAGNOSIS — D649 Anemia, unspecified: Secondary | ICD-10-CM | POA: Diagnosis present

## 2023-12-14 DIAGNOSIS — I2699 Other pulmonary embolism without acute cor pulmonale: Secondary | ICD-10-CM | POA: Diagnosis not present

## 2023-12-14 DIAGNOSIS — G43909 Migraine, unspecified, not intractable, without status migrainosus: Secondary | ICD-10-CM | POA: Diagnosis present

## 2023-12-14 DIAGNOSIS — K9189 Other postprocedural complications and disorders of digestive system: Secondary | ICD-10-CM | POA: Diagnosis not present

## 2023-12-14 DIAGNOSIS — D75839 Thrombocytosis, unspecified: Secondary | ICD-10-CM | POA: Diagnosis present

## 2023-12-14 DIAGNOSIS — I2602 Saddle embolus of pulmonary artery with acute cor pulmonale: Secondary | ICD-10-CM

## 2023-12-14 DIAGNOSIS — Z9884 Bariatric surgery status: Secondary | ICD-10-CM | POA: Diagnosis not present

## 2023-12-14 DIAGNOSIS — Z6837 Body mass index (BMI) 37.0-37.9, adult: Secondary | ICD-10-CM | POA: Diagnosis not present

## 2023-12-14 DIAGNOSIS — N390 Urinary tract infection, site not specified: Secondary | ICD-10-CM | POA: Diagnosis not present

## 2023-12-14 DIAGNOSIS — K56600 Partial intestinal obstruction, unspecified as to cause: Secondary | ICD-10-CM | POA: Diagnosis present

## 2023-12-14 DIAGNOSIS — Z7901 Long term (current) use of anticoagulants: Secondary | ICD-10-CM | POA: Diagnosis not present

## 2023-12-14 DIAGNOSIS — Z886 Allergy status to analgesic agent status: Secondary | ICD-10-CM | POA: Diagnosis not present

## 2023-12-14 DIAGNOSIS — R0600 Dyspnea, unspecified: Secondary | ICD-10-CM | POA: Insufficient documentation

## 2023-12-14 DIAGNOSIS — Z86711 Personal history of pulmonary embolism: Secondary | ICD-10-CM | POA: Diagnosis not present

## 2023-12-14 DIAGNOSIS — Z888 Allergy status to other drugs, medicaments and biological substances status: Secondary | ICD-10-CM | POA: Diagnosis not present

## 2023-12-14 DIAGNOSIS — F419 Anxiety disorder, unspecified: Secondary | ICD-10-CM | POA: Diagnosis present

## 2023-12-14 DIAGNOSIS — K567 Ileus, unspecified: Secondary | ICD-10-CM | POA: Diagnosis not present

## 2023-12-14 DIAGNOSIS — E441 Mild protein-calorie malnutrition: Secondary | ICD-10-CM | POA: Diagnosis present

## 2023-12-14 DIAGNOSIS — Y838 Other surgical procedures as the cause of abnormal reaction of the patient, or of later complication, without mention of misadventure at the time of the procedure: Secondary | ICD-10-CM | POA: Diagnosis present

## 2023-12-14 DIAGNOSIS — Z884 Allergy status to anesthetic agent status: Secondary | ICD-10-CM | POA: Diagnosis not present

## 2023-12-14 DIAGNOSIS — T81718A Complication of other artery following a procedure, not elsewhere classified, initial encounter: Secondary | ICD-10-CM | POA: Diagnosis present

## 2023-12-14 DIAGNOSIS — Z87891 Personal history of nicotine dependence: Secondary | ICD-10-CM | POA: Diagnosis not present

## 2023-12-14 DIAGNOSIS — E66812 Obesity, class 2: Secondary | ICD-10-CM | POA: Diagnosis present

## 2023-12-14 DIAGNOSIS — K219 Gastro-esophageal reflux disease without esophagitis: Secondary | ICD-10-CM | POA: Diagnosis present

## 2023-12-14 LAB — COMPREHENSIVE METABOLIC PANEL
ALT: 12 U/L (ref 0–44)
AST: 21 U/L (ref 15–41)
Albumin: 3 g/dL — ABNORMAL LOW (ref 3.5–5.0)
Alkaline Phosphatase: 106 U/L (ref 38–126)
Anion gap: 8 (ref 5–15)
BUN: 7 mg/dL (ref 6–20)
CO2: 24 mmol/L (ref 22–32)
Calcium: 8.8 mg/dL — ABNORMAL LOW (ref 8.9–10.3)
Chloride: 100 mmol/L (ref 98–111)
Creatinine, Ser: 0.54 mg/dL (ref 0.44–1.00)
GFR, Estimated: >60 mL/min (ref >60)
Glucose, Bld: 94 mg/dL (ref 70–99)
Potassium: 4.2 mmol/L (ref 3.5–5.1)
Sodium: 132 mmol/L — ABNORMAL LOW (ref 135–145)
Total Bilirubin: 0.3 mg/dL (ref <1.2)
Total Protein: 7 g/dL (ref 6.5–8.1)

## 2023-12-14 LAB — URINALYSIS, W/ REFLEX TO CULTURE (INFECTION SUSPECTED)
Bilirubin Urine: NEGATIVE
Glucose, UA: NEGATIVE mg/dL
Ketones, ur: NEGATIVE mg/dL
Nitrite: NEGATIVE
Protein, ur: NEGATIVE mg/dL
Specific Gravity, Urine: 1.016 (ref 1.005–1.030)
pH: 7 (ref 5.0–8.0)

## 2023-12-14 LAB — CBC
HCT: 32.5 % — ABNORMAL LOW (ref 36.0–46.0)
Hemoglobin: 10.4 g/dL — ABNORMAL LOW (ref 12.0–15.0)
MCH: 29.8 pg (ref 26.0–34.0)
MCHC: 32 g/dL (ref 30.0–36.0)
MCV: 93.1 fL (ref 80.0–100.0)
Platelets: 476 10*3/uL — ABNORMAL HIGH (ref 150–400)
RBC: 3.49 MIL/uL — ABNORMAL LOW (ref 3.87–5.11)
RDW: 13.8 % (ref 11.5–15.5)
WBC: 6.7 10*3/uL (ref 4.0–10.5)
nRBC: 0 % (ref 0.0–0.2)

## 2023-12-14 LAB — PHOSPHORUS: Phosphorus: 3.7 mg/dL (ref 2.5–4.6)

## 2023-12-14 LAB — HEPARIN LEVEL (UNFRACTIONATED)
Heparin Unfractionated: 0.16 [IU]/mL — ABNORMAL LOW (ref 0.30–0.70)
Heparin Unfractionated: 0.17 [IU]/mL — ABNORMAL LOW (ref 0.30–0.70)
Heparin Unfractionated: 0.38 [IU]/mL (ref 0.30–0.70)

## 2023-12-14 LAB — ECHOCARDIOGRAM COMPLETE
Area-P 1/2: 5.02 cm2
Calc EF: 59.1 %
Height: 60 in
S' Lateral: 2.8 cm
Single Plane A2C EF: 59.2 %
Single Plane A4C EF: 59.5 %
Weight: 2992 [oz_av]

## 2023-12-14 LAB — MAGNESIUM: Magnesium: 2.3 mg/dL (ref 1.7–2.4)

## 2023-12-14 MED ORDER — BUTALBITAL-APAP-CAFFEINE 50-325-40 MG PO TABS
2.0000 | ORAL_TABLET | Freq: Every day | ORAL | Status: DC | PRN
  Administered 2023-12-15: 2 via ORAL
  Filled 2023-12-14: qty 2

## 2023-12-14 MED ORDER — CHLORHEXIDINE GLUCONATE CLOTH 2 % EX PADS
6.0000 | MEDICATED_PAD | Freq: Every day | CUTANEOUS | Status: DC
  Administered 2023-12-14 – 2023-12-16 (×3): 6 via TOPICAL
  Filled 2023-12-14: qty 6

## 2023-12-14 MED ORDER — ACETAMINOPHEN 650 MG RE SUPP: 650.0000 mg | Freq: Four times a day (QID) | RECTAL | Status: DC | PRN

## 2023-12-14 MED ORDER — PANTOPRAZOLE SODIUM 40 MG IV SOLR
40.0000 mg | Freq: Every day | INTRAVENOUS | Status: DC
  Administered 2023-12-14 – 2023-12-16 (×3): 40 mg via INTRAVENOUS
  Filled 2023-12-14 (×3): qty 10

## 2023-12-14 MED ORDER — POLYETHYLENE GLYCOL 3350 17 G PO PACK
17.0000 g | PACK | Freq: Every day | ORAL | Status: DC
Start: 2023-12-14 — End: 2023-12-16
  Administered 2023-12-14 – 2023-12-16 (×3): 17 g via ORAL
  Filled 2023-12-14 (×4): qty 1

## 2023-12-14 MED ORDER — ONDANSETRON HCL 4 MG/2ML IJ SOLN
4.0000 mg | Freq: Four times a day (QID) | INTRAMUSCULAR | Status: DC | PRN
  Administered 2023-12-15: 4 mg via INTRAVENOUS
  Filled 2023-12-14: qty 2

## 2023-12-14 MED ORDER — SODIUM CHLORIDE 0.9 % IV SOLN
1.0000 g | Freq: Every day | INTRAVENOUS | Status: DC
  Administered 2023-12-14 – 2023-12-15 (×2): 1 g via INTRAVENOUS
  Filled 2023-12-14 (×3): qty 10

## 2023-12-14 MED ORDER — ORAL CARE MOUTH RINSE: 15.0000 mL | OROMUCOSAL | Status: DC | PRN

## 2023-12-14 MED ORDER — SENNOSIDES-DOCUSATE SODIUM 8.6-50 MG PO TABS
1.0000 | ORAL_TABLET | Freq: Two times a day (BID) | ORAL | Status: DC
  Administered 2023-12-14 – 2023-12-16 (×5): 1 via ORAL
  Filled 2023-12-14 (×5): qty 1

## 2023-12-14 MED ORDER — DULOXETINE HCL 60 MG PO CPEP
60.0000 mg | ORAL_CAPSULE | Freq: Every day | ORAL | Status: DC
  Administered 2023-12-14 – 2023-12-15 (×2): 60 mg via ORAL
  Filled 2023-12-14: qty 2
  Filled 2023-12-14: qty 1

## 2023-12-14 MED ORDER — HYDROMORPHONE HCL 1 MG/ML IJ SOLN
1.0000 mg | INTRAMUSCULAR | Status: DC | PRN
  Administered 2023-12-14 – 2023-12-15 (×6): 1 mg via INTRAVENOUS
  Filled 2023-12-14 (×6): qty 1

## 2023-12-14 MED ORDER — METHOCARBAMOL 500 MG PO TABS
1000.0000 mg | ORAL_TABLET | Freq: Four times a day (QID) | ORAL | Status: DC | PRN
  Administered 2023-12-14 – 2023-12-15 (×2): 1000 mg via ORAL
  Filled 2023-12-14 (×2): qty 2

## 2023-12-14 MED ORDER — HEPARIN BOLUS VIA INFUSION
2000.0000 [IU] | Freq: Once | INTRAVENOUS | Status: AC
  Administered 2023-12-14: 2000 [IU] via INTRAVENOUS

## 2023-12-14 MED ORDER — ONDANSETRON HCL 4 MG PO TABS
4.0000 mg | ORAL_TABLET | Freq: Four times a day (QID) | ORAL | Status: DC | PRN
Start: 2023-12-14 — End: 2023-12-16

## 2023-12-14 MED ORDER — ACETAMINOPHEN 325 MG PO TABS: 650.0000 mg | ORAL_TABLET | Freq: Four times a day (QID) | ORAL | Status: DC | PRN

## 2023-12-14 MED ORDER — HEPARIN BOLUS VIA INFUSION
4000.0000 [IU] | Freq: Once | INTRAVENOUS | Status: AC
  Administered 2023-12-14: 4000 [IU] via INTRAVENOUS
  Filled 2023-12-14: qty 4000

## 2023-12-14 NOTE — Progress Notes (Addendum)
[facility] Progress Note     Subjective: CC:  Reports uncontrolled abdominal pain at home ever since surgery. States she has had left, upper abdominal spasms/severe grabbing pain whenever she engages her core (ex- when sitting up). This pain occurs around one of her port sites. Pain does improve with heat and rest but she feels it takes a while to recover. She also has some pressure/back discomfort on her left flank/back. She reports she has been eating and drinking ok at home without nausea, vomiting, or significant belching. She reports flatus and loose BMs. Last BM 1-2 days ago. She has been taking senna daily.   She does report urinary retention post-op. States PVR of 300 mL prior to hospital discharge. Reports she initially had urinary hesitancy (taking 20+minutes to start her stream) but that has resolved. She does have pelvic pressure/discomfort at the end of her stream. Her UA at urgent care showed nitrates and she was prescribed Macrobid, only took 1 dose.  Objective: Vital signs in last 24 hours: Temp:  [98 F (36.7 C)-98.7 F (37.1 C)] 98.6 F (37 C) (12/27 1109) Pulse Rate:  [78-113] 95 (12/27 1200) Resp:  [11-35] 20 (12/27 1200) BP: (107-132)/(48-97) 107/48 (12/27 1200) SpO2:  [91 %-100 %] 98 % (12/27 1200) Weight:  [84.8 kg] 84.8 kg (12/26 1959) Last BM Date : 12/13/23  Intake/Output from previous day: 12/26 0701 - 12/27 0700 In: 1000 [IV Piggyback:1000] Out: -  Intake/Output this shift: No intake/output data recorded.  PE: Gen:  Alert, NAD, pleasant Card:  Regular rate and rhythm Pulm:  Normal effort ORA Abd: Soft, overall non-tender, mild distention, incisions c/d/I, no guarding, no rebound tenderness Skin: warm and dry, no rashes  Psych: A&Ox3   Lab Results:  Recent Labs    12/13/23 1741  WBC 7.0  HGB 10.5*  HCT 33.0*  PLT 459*   BMET Recent Labs    12/13/23 1741  NA 139  K 3.8  CL 103  CO2 28  GLUCOSE 137*  BUN 12  CREATININE  0.67  CALCIUM 8.9   PT/INR No results for input(s): "LABPROT", "INR" in the last 72 hours. CMP     Component Value Date/Time   NA 139 12/13/2023 1741   NA 138 09/23/2023 0000   K 3.8 12/13/2023 1741   CL 103 12/13/2023 1741   CO2 28 12/13/2023 1741   GLUCOSE 137 (H) 12/13/2023 1741   BUN 12 12/13/2023 1741   BUN 9 09/23/2023 0000   CREATININE 0.67 12/13/2023 1741   CREATININE 0.60 04/06/2016 1700   CALCIUM 8.9 12/13/2023 1741   PROT 7.6 12/04/2023 2123   PROT 6.9 03/28/2023 1000   ALBUMIN 3.9 12/04/2023 2123   ALBUMIN 4.2 03/28/2023 1000   AST 20 12/04/2023 2123   ALT 14 12/04/2023 2123   ALKPHOS 160 (H) 12/04/2023 2123   BILITOT 0.4 12/04/2023 2123   BILITOT <0.2 03/28/2023 1000   GFRNONAA >60 12/13/2023 1741   GFRNONAA >89 04/06/2016 1700   GFRAA >60 04/23/2019 0105   GFRAA >89 04/06/2016 1700   Lipase     Component Value Date/Time   LIPASE 25 12/04/2023 2123       Studies/Results: CT Angio Chest PE W and/or Wo Contrast Result Date: 12/13/2023 CLINICAL DATA:  Shortness of breath with left lower extremity pain and suprapubic pressure. EXAM: CT ANGIOGRAPHY CHEST WITH CONTRAST TECHNIQUE: Multidetector CT imaging of the chest was performed using the standard protocol during bolus administration of intravenous contrast. Multiplanar CT image reconstructions  and MIPs were obtained to evaluate the vascular anatomy. RADIATION DOSE REDUCTION: This exam was performed according to the departmental dose-optimization program which includes automated exposure control, adjustment of the mA and/or kV according to patient size and/or use of iterative reconstruction technique. CONTRAST:  OMNIPAQUE IOHEXOL 350 MG/ML SOLN COMPARISON:  Apr 23, 2019 FINDINGS: Cardiovascular: The thoracic aorta is unremarkable. Satisfactory opacification of the pulmonary arteries to the segmental level. A mild amount of intraluminal low attenuation is seen involving multiple posterior upper lobe branches  of the right pulmonary artery. Normal heart size. No right heart strain is identified (RV/LV ratio of 0.98). No pericardial effusion. Mediastinum/Nodes: No enlarged mediastinal, hilar, or axillary lymph nodes. Thyroid gland, trachea, and esophagus demonstrate no significant findings. Lungs/Pleura: Mild linear scarring and/or atelectasis is seen within the bilateral lung bases, left greater than right. No pleural effusion or pneumothorax is identified. Upper Abdomen: There is a small hiatal hernia with numerous surgical sutures seen throughout the gastric region. Musculoskeletal: No chest wall abnormality. No acute or significant osseous findings. Review of the MIP images confirms the above findings. IMPRESSION: 1. Mild amount of pulmonary embolism involving multiple upper lobe branches of the right pulmonary artery. 2. No evidence of associated right heart strain. 3. Mild bibasilar linear scarring and/or atelectasis, left greater than right. 4. 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 Electronically Signed   By: Aram Candela M.D.   On: 12/13/2023 19:39   CT ABDOMEN PELVIS W CONTRAST Result Date: 12/13/2023 CLINICAL DATA:  Suprapubic pressure and left lower back pain. EXAM: CT ABDOMEN AND PELVIS WITH CONTRAST TECHNIQUE: Multidetector CT imaging of the abdomen and pelvis was performed using the standard protocol following bolus administration of intravenous contrast. RADIATION DOSE REDUCTION: This exam was performed according to the departmental dose-optimization program which includes automated exposure control, adjustment of the mA and/or kV according to patient size and/or use of iterative reconstruction technique. CONTRAST:  OMNIPAQUE IOHEXOL 350 MG/ML SOLN COMPARISON:  December 05, 2023 FINDINGS: Lower chest: Mild atelectasis is seen within the bilateral lung bases. Hepatobiliary: No focal liver abnormality is seen. Status post cholecystectomy. No biliary dilatation.  Pancreas: Unremarkable. No pancreatic ductal dilatation or surrounding inflammatory changes. Spleen: Normal in size without focal abnormality. Adrenals/Urinary Tract: Adrenal glands are unremarkable. Kidneys are normal, without renal calculi, focal lesion, or hydronephrosis. The urinary bladder is poorly distended and subsequently limited in evaluation. Stomach/Bowel: There is a small hiatal hernia. Surgical sutures are seen throughout the gastric region with surgically anastomosed bowel also noted within the anterior aspect of the mid abdomen. Appendix appears normal. Multiple dilated loops of air and fluid filled jejunum are seen (maximum small bowel diameter of approximately 4.1 cm). A clear transition zone is not identified. Vascular/Lymphatic: No significant vascular findings are present. No enlarged abdominal or pelvic lymph nodes. Reproductive: The uterus is heterogeneous in appearance. A 2.1 cm diameter tortuous fluid-filled tubular appearing structure is seen along the posterior aspect of the left adnexa. This represents a new finding when compared to the prior exam. Other: No abdominal wall hernia or abnormality. No abdominopelvic ascites. Musculoskeletal: No acute or significant osseous findings. IMPRESSION: 1. Findings consistent with a partial small bowel obstruction versus ileus. 2. Findings which may represent a left-sided hydrosalpinx. Correlation with pelvic ultrasound is recommended. 3. Evidence of prior cholecystectomy. 4. Small hiatal hernia. Electronically Signed   By: Aram Candela M.D.   On: 12/13/2023 19:32    Anti-infectives: Anti-infectives (From admission, onward)  None        Assessment/Plan Post-operative ileus vs pSBO, favor ileus Acute pulmonary embolus 42 y/o F with hx RYGB 07/2022 at [facility]23 who was recently admitted here due to internal hernia. She is s/p  diagnostic laparoscopy, cholecystectomy, internal hernia repair 12/18 by Dr. Freida Busman (POD#9). She was  discharged on POD#2. She returns with intermittent abdominal pain, urinary sxs, SOB, and calf discomfort. She was diagnosed with a PE and admitted to the hospital. CT scan shows ileus vs SBO. Clinically the patient is not obstructed - she is passing gas and having loose stools. She has mild distention. CBC and CMP are unremarkable. No emergent role for surgery. Allow FLD, continue stool softeners, monitor. Her abdominal pain does sound muscular in nature as it occurs with movement - continue heat, muscle relaxer.  Currently on hep gtt for acute PE. Urine cx pending, mgmt UTI per primary team.    LOS: 0 days   I reviewed nursing notes, ED provider notes, hospitalist notes, last 24 h vitals and pain scores, last 48 h intake and output, last 24 h labs and trends, and last 24 h imaging results.  This care required moderate level of medical decision making.   Hosie Spangle, PA-C [facility] Please see Amion for pager number during day hours 7:00am-4:30pm

## 2023-12-14 NOTE — Progress Notes (Signed)
Bilateral lower extremity venous duplex has been completed. Preliminary results can be found in CV Proc through chart review.   12/14/23 1:16 PM Olen Cordial RVT

## 2023-12-14 NOTE — Progress Notes (Signed)
ANTICOAGULATION CONSULT NOTE  Pharmacy Consult for Heparin Indication: pulmonary embolus Brief A/P: Heparin level subtherapeutic Increase Heparin rate  Allergies  Allergen Reactions   Novocain [Procaine] Anaphylaxis   Povidone Iodine Rash    Rash    Patient Measurements: Height: 5' (152.4 cm) Weight: 87.5 kg (192 lb 14.4 oz) IBW/kg (Calculated) : 45.5 Heparin Dosing Weight: 65.3 kg  Vital Signs: Temp: 98.9 F (37.2 C) (12/27 2000) Temp Source: Oral (12/27 2000) BP: 124/60 (12/27 1900) Pulse Rate: 91 (12/27 2100)  Labs: Recent Labs    12/13/23 1741 12/13/23 1932 12/14/23 0253 12/14/23 1218 12/14/23 2052  HGB 10.5*  --   --  10.4*  --   HCT 33.0*  --   --  32.5*  --   PLT 459*  --   --  476*  --   HEPARINUNFRC  --   --  0.16* 0.17* 0.38  CREATININE 0.67  --   --  0.54  --   TROPONINIHS 3 3  --   --   --     Estimated Creatinine Clearance: 91 mL/min (by C-G formula based on SCr of 0.54 mg/dL).  Assessment: 42 yo female s/p recent hernia repair, admitted with new PE and possible SBO. Pharmacy to dose IV heparin until need for invasive procedure ruled out.  Baseline INR, aPTT: not done Prior anticoagulation: none  Significant events:  Today, 12/14/2023: CBC: Hgb slightly low but stable from admission; Plt borderline high and stable Most recent heparin level SUBtherapeutic despite rate increase to 1300 units/hr SCr stable at baseline (< 1.0) No bleeding or infusion issues per nursing  2052 HL 0.38 therapeutic on 1600 units/hr No bleeding noted  Goal of Therapy: Heparin level 0.3-0.7 units/ml Monitor platelets by anticoagulation protocol: Yes  Plan: continue heparin drip at 1600 units/hr Check heparin level with am labs Daily CBC, daily heparin level once stable Monitor for signs of bleeding or thrombosis  Arley Phenix RPh 12/14/2023, 9:39 PM

## 2023-12-14 NOTE — H&P (Signed)
History and Physical    Patient: Jessica Banks:[id number] DOB: 1981/11/07 DOA: 12/13/2023 DOS: the patient was seen and examined on 12/14/2023 PCP: Dorothyann Peng, MD  Patient coming from: Home  Chief Complaint:  Chief Complaint  Patient presents with   Shortness of Breath   Post-op Problem   HPI: Jessica Banks is a 42 y.o. 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  She has been doing well after surgery and her reflux has resolved.  She has been having upper abdominal that it is thought to be due to her symptoms from her gallbladder.  She underwent internal hernia repair on 12/05/2023 with [facility].  She was seen at urgent care on 12/10/2023 and treated for an UTI with nitrofurantoin, but still having dysuria.  She presented to the emergency department due to dyspnea associated with left calf tenderness, chest tightness, dyspnea and fatigue.  She denied fever, chills, rhinorrhea, sore throat, wheezing or hemoptysis.  No diaphoresis, PND, orthopnea or pitting edema of the lower extremities.  Still having some abdominal discomfort postop, but no nausea, emesis, diarrhea, constipation, melena or hematochezia.  No flank pain, but positive dysuria, no frequency or hematuria.  No polyuria, polydipsia, polyphagia or blurred vision.   Lab work: CBC showed white count 7.0, hemoglobin 10.5 g/dL and platelets 811.  BMP showed a glucose of 137 mg/dL, but the rest of the measurements were normal.  Troponin x 2 were 3 ng/L.  Imaging: CTA chest showing a mild amount of pulmonary embolism involving multiple upper lobe  branches of the right pulmonary artery.  No evidence of associated right heart strain.  Mild bibasilar linear scaring and/or atelectasis, left greater than right.  Findings consistent with prior gastric bypass surgery.  CT abdomen/pelvis with contrast showing findings Konsyl and with partial SBO versus ileus.  There is also possible left-sided hydrosalpinx, correlation with pelvic ultrasound recommended.  Evidence of prior cholecystectomy.  Small hiatal hernia.   ED course: Initial vital signs were temperature 98.1 F, pulse 113, respirations 24, BP 129/89 mmHg and O2 sat 100% on room air.  The patient received 1000 mL normal saline bolus, ondansetron 4 mg IVP, hydromorphone 1 mg IVP and was started on an insulin infusion.  Review of Systems: As mentioned in the history of present illness. All other systems reviewed and are negative. Past Medical History:  Diagnosis Date   Abnormal Pap smear    CIN I   Anemia    Anxiety    Depression    off meds now, ok now   Enlarged thyroid gland    nromal function   GERD (gastroesophageal reflux disease)    Migraine    Obesity    Ovarian cyst    Vaginal Pap smear, abnormal    ok now   Past Surgical History:  Procedure Laterality Date   GASTRIC BYPASS  08/14/2022   Revision of sleeve/hiatal hernia repair   LAPAROSCOPIC GASTRIC SLEEVE RESECTION  2017   LAPAROSCOPY N/A 12/05/2023   Procedure: LAPAROSCOPY DIAGNOSTIC, CHOLECYSTECOMY, INTERNAL HERNIA REPAIR;  Surgeon: Fritzi Mandes, MD;  Location: [facility];  Service: General;  Laterality: N/A;   Social History:  reports  that she quit smoking about 10 years ago. Her smoking use included cigarettes. She started smoking about 14 years ago. She has never used smokeless tobacco. She reports that she does not currently use alcohol. She reports that she does not use drugs.  Allergies  Allergen Reactions   Novocain [Procaine] Anaphylaxis   Povidone Iodine Rash    Rash    Family History  Problem Relation  Age of Onset   Thyroid disease Mother        s/p thyroidectomy   Arthritis Mother    Leukemia Maternal Grandmother    Stroke Maternal Grandmother    Cancer Maternal Grandfather        lung   Heart disease Paternal Grandmother    COPD Paternal Grandfather    Hypertension Maternal Uncle    Diabetes Maternal Uncle     Prior to Admission medications   Medication Sig Start Date End Date Taking? Authorizing Provider  acetaminophen (TYLENOL) 500 MG tablet Take 2 tablets (1,000 mg total) by mouth every 6 (six) hours as needed. 12/07/23   Barnetta Chapel, PA-C  amphetamine-dextroamphetamine (ADDERALL XR) 30 MG 24 hr capsule Take 30 mg by mouth daily.    [provider]  butalbital-acetaminophen-caffeine (FIORICET) 50-325-40 MG tablet Take 1 tablet by mouth 2 (two) times daily as needed. Patient taking differently: Take 2 tablets by mouth daily as needed for headache or migraine. 11/19/23   Penumalli, Glenford Bayley, MD  cyclobenzaprine (FLEXERIL) 10 MG tablet Take 1 tablet (10 mg total) by mouth daily as needed. Patient taking differently: Take 10 mg by mouth daily as needed for muscle spasms. 11/19/23   Penumalli, Glenford Bayley, MD  DULoxetine (CYMBALTA) 60 MG capsule Take 1 capsule (60 mg total) by mouth daily. Patient taking differently: Take 60 mg by mouth at bedtime. 11/19/23   Penumalli, Glenford Bayley, MD  Ferrous Sulfate (IRON PO) Take 1 tablet by mouth at bedtime.    [provider]  Galcanezumab-gnlm (EMGALITY) 120 MG/ML SOAJ Inject 1 Pen into the skin every 30 (thirty) days. 11/19/23   Penumalli, Glenford Bayley, MD  Melatonin 10 MG CAPS Take 10 mg by mouth at bedtime.    [provider]  Multiple Vitamin (MULTIVITAMIN) tablet Take 1 tablet by mouth at bedtime.    [provider]  nitrofurantoin, macrocrystal-monohydrate, (MACROBID) 100 MG capsule Take 1 capsule (100 mg total) by mouth 2 (two) times daily. 12/10/23   Landis Martins, PA-C  Probiotic Product (FORTIFY PROBIOTIC  WOMENS PO) Take by mouth.    [provider]  Rimegepant Sulfate (NURTEC) 75 MG TBDP Take 1 tablet (75 mg total) by mouth daily as needed. Patient taking differently: Take 75 mg by mouth daily as needed (migraines). 11/19/23   Penumalli, Glenford Bayley, MD  tamsulosin (FLOMAX) 0.4 MG CAPS capsule Take 0.4 mg by mouth.    [provider]  temazepam (RESTORIL) 15 MG capsule TAKE 1 CAPSULE BY MOUTH AT BEDTIME AS NEEDED FOR SLEEP Patient taking differently: Take 15 mg by mouth at bedtime. 10/17/23   Dorothyann Peng, MD  traMADol (ULTRAM) 50 MG tablet Take 1 tablet (50 mg total) by mouth every 6 (six) hours as needed (pain). 12/07/23   Barnetta Chapel, PA-C  VITAMIN D PO Take 1 capsule by mouth at bedtime.    [provider]    Physical Exam: Vitals:   12/14/23 0630 12/14/23 0825 12/14/23 1109 12/14/23 1113  BP: 122/75   122/79  Pulse: 79  88 91  Resp: 18  14 (!) 35  Temp:  98.7 F (37.1 C) 98.6 F (37 C)   TempSrc:  Oral Oral   SpO2: 97%  100% 100%  Weight:      Height:   5' (1.524 m)    Physical Exam Vitals and nursing note reviewed.  Constitutional:      General: She is awake. She is not in acute distress.    Appearance: She is well-developed. She is obese. She is ill-appearing.  HENT:     Head: Normocephalic.     Nose: No rhinorrhea.     Mouth/Throat:     Mouth: Mucous membranes are moist.  Eyes:     General: No scleral icterus.    Pupils: Pupils are equal, round, and reactive to light.  Neck:     Vascular: No JVD.  Cardiovascular:     Rate and Rhythm: Normal rate and regular rhythm.     Heart sounds: S1 normal and S2 normal.  Pulmonary:     Effort: Pulmonary effort is normal. No accessory muscle usage.     Breath sounds: No wheezing, rhonchi or rales.  Abdominal:     General: Abdomen is protuberant. Bowel sounds are normal. There is distension.     Palpations: Abdomen is soft.     Tenderness: There is abdominal tenderness.     Comments: Mild  distention  Musculoskeletal:     Cervical back: Neck supple.     Right lower leg: No edema.     Left lower leg: No edema.  Skin:    General: Skin is warm and dry.  Neurological:     General: No focal deficit present.     Mental Status: She is alert and oriented to person, place, and time.  Psychiatric:        Mood and Affect: Mood normal.        Behavior: Behavior normal. Behavior is cooperative.     Data Reviewed:  Results are pending, will review when available.  07/27/2017 echocardiogram report.  History:    PMH:  Palpitations. [redacted] weeks pregnant.  Risk factors: Former tobacco use. Obese.  -------------------------------------------------------------------  Study Conclusions   - Left ventricle: The cavity size was normal. Wall thickness was    normal. Systolic function was normal. The estimated ejection    fraction was in the range of 55% to 60%. Wall motion was normal;    there were no regional wall motion abnormalities. Left    ventricular diastolic function parameters were normal.  - Left atrium: The atrium was normal in size.  - Tricuspid valve: There was trivial regurgitation.  - Pulmonary arteries: PA peak pressure: 20 mm Hg (S).  - Inferior vena cava: The vessel was normal in size. The    respirophasic diameter changes were in the normal range (>= 50%),    consistent with normal central venous pressure.   Impressions:  - Normal study.  Today's echocardiogram report IMPRESSIONS:   1. Left ventricular ejection fraction, by estimation, is 55 to 60%. The left ventricle has normal function. The left ventricle has no regional wall motion abnormalities. Left ventricular diastolic parameters were normal.  2. Right ventricular systolic function is normal. The right ventricular size is normal.  3. The mitral valve is normal in structure. Trivial mitral valve regurgitation. No evidence of mitral stenosis.  4. The aortic valve is tricuspid. Aortic valve  regurgitation is not visualized. No aortic stenosis is present.  5. The inferior vena cava is normal in size with greater than  50% respiratory variability, suggesting right atrial pressure of 3 mmHg.  EKG: Vent. rate 106 BPM PR interval 138 ms QRS duration 85 ms QT/QTcB 343/456 ms P-R-T axes 41 22 -3 Sinus tachycardia Borderline T wave abnormalities  Assessment and Plan: Principal Problem:   Acute pulmonary embolism (HCC) Stepdown/inpatient. In no distress on room air. Supplemental oxygen as needed. Continue heparin infusion. Briefly discussed DOAC therapy. Normal echocardiogram today.   Active Problems:   Acute lower UTI (urinary tract infection) Repeat urine analysis still at normal. Begin ceftriaxone 1 g IVPB daily. Follow-up urine culture and sensitivity.    Migraine No headaches at this time.    Hyponatremia Mild. Unknown significance. Likely due to decreased oral intake.    Hyperglycemia Yesterday evening sample. Normal glucose on fasting sample today.    Mild protein malnutrition (HCC) Anemic. Recent surgery with overall decrease in oral intake. Might benefit of from protein supplementation. Follow-up albumin level.    Thrombocytosis In the setting of anemia, PE and recent procedure. Monitor platelet count.    Normocytic anemia Status post recent surgery. Monitor hematocrit and hemoglobin.    Advance Care Planning:   Code Status: Full Code   Consults: [facility].  Family Communication:   Severity of Illness: The appropriate patient status for this patient is INPATIENT. Inpatient status is judged to be reasonable and necessary in order to provide the required intensity of service to ensure the patient's safety. The patient's presenting symptoms, physical exam findings, and initial radiographic and laboratory data in the context of their chronic comorbidities is felt to place them at high risk for further clinical deterioration.  Furthermore, it is not anticipated that the patient will be medically stable for discharge from the hospital within 2 midnights of admission.   * I certify that at the point of admission it is my clinical judgment that the patient will require inpatient hospital care spanning beyond 2 midnights from the point of admission due to high intensity of service, high risk for further deterioration and high frequency of surveillance required.*  Author: Bobette Mo, MD 12/14/2023 11:26 AM  For on call review [email or web address].   This document was prepared using Dragon voice recognition software and may contain some unintended transcription errors.

## 2023-12-14 NOTE — Progress Notes (Signed)
  Echocardiogram 2D Echocardiogram has been performed.  Jessica Banks 12/14/2023, 3:08 PM

## 2023-12-14 NOTE — Progress Notes (Signed)
ANTICOAGULATION CONSULT NOTE  Pharmacy Consult for Heparin Indication: pulmonary embolus Brief A/P: Heparin level subtherapeutic Increase Heparin rate  Allergies  Allergen Reactions   Novocain [Procaine] Anaphylaxis   Povidone Iodine Rash    Rash    Patient Measurements: Height: 5' (152.4 cm) Weight: 84.8 kg (187 lb) IBW/kg (Calculated) : 45.5 Heparin Dosing Weight: 65.3 kg  Vital Signs: Temp: 98 F (36.7 C) (12/27 0200) Temp Source: Oral (12/27 0200) BP: 118/80 (12/27 0430) Pulse Rate: 95 (12/27 0430)  Labs: Recent Labs    12/13/23 1741 12/13/23 1932 12/14/23 0253  HGB 10.5*  --   --   HCT 33.0*  --   --   PLT 459*  --   --   HEPARINUNFRC  --   --  0.16*  CREATININE 0.67  --   --   TROPONINIHS 3 3  --     Estimated Creatinine Clearance: 89.4 mL/min (by C-G formula based on SCr of 0.67 mg/dL).  Assessment: 42 yo female s/p recent hernia repair with new PE for heparin.   Goal of Therapy:  Heparin level 0.3-0.7 units/ml Monitor platelets by anticoagulation protocol: Yes   Plan:  Heparin 2000 units IV bolus, then increase heparin  1300 units/hr Check heparin level in 6 hours.   Geannie Risen, PharmD, BCPS

## 2023-12-14 NOTE — Progress Notes (Addendum)
[facility] day shift admitting team.  The surgical team has asked [place] to see if this patient is appropriate for [facility].  She is currently PCU status at [facility], but no beds available at the moment. A SDU bed at [facility] has been requested.    Sanda Klein, MD.

## 2023-12-14 NOTE — Progress Notes (Signed)
ANTICOAGULATION CONSULT NOTE  Pharmacy Consult for Heparin Indication: pulmonary embolus Brief A/P: Heparin level subtherapeutic Increase Heparin rate  Allergies  Allergen Reactions   Novocain [Procaine] Anaphylaxis   Povidone Iodine Rash    Rash    Patient Measurements: Height: 5' (152.4 cm) Weight: 84.8 kg (187 lb) IBW/kg (Calculated) : 45.5 Heparin Dosing Weight: 65.3 kg  Vital Signs: Temp: 98.6 F (37 C) (12/27 1109) Temp Source: Oral (12/27 1109) BP: 154/102 (12/27 1400) Pulse Rate: 80 (12/27 1400)  Labs: Recent Labs    12/13/23 1741 12/13/23 1932 12/14/23 0253 12/14/23 1218  HGB 10.5*  --   --  10.4*  HCT 33.0*  --   --  32.5*  PLT 459*  --   --  476*  HEPARINUNFRC  --   --  0.16* 0.17*  CREATININE 0.67  --   --  0.54  TROPONINIHS 3 3  --   --     Estimated Creatinine Clearance: 89.4 mL/min (by C-G formula based on SCr of 0.54 mg/dL).  Assessment: 42 yo female s/p recent hernia repair, admitted with new PE and possible SBO. Pharmacy to dose IV heparin until need for invasive procedure ruled out.  Baseline INR, aPTT: not done Prior anticoagulation: none  Significant events:  Today, 12/14/2023: CBC: Hgb slightly low but stable from admission; Plt borderline high and stable Most recent heparin level SUBtherapeutic despite rate increase to 1300 units/hr SCr stable at baseline (< 1.0) No bleeding or infusion issues per nursing  Goal of Therapy: Heparin level 0.3-0.7 units/ml Monitor platelets by anticoagulation protocol: Yes  Plan: Increase heparin IV infusion to 1600 units/hr Repeat 4000 unit bolus Check heparin level 6 hrs after rate change Daily CBC, daily heparin level once stable Monitor for signs of bleeding or thrombosis  Bernadene Person, PharmD, BCPS [phone] 12/14/2023, 2:22 PM

## 2023-12-15 DIAGNOSIS — I2699 Other pulmonary embolism without acute cor pulmonale: Secondary | ICD-10-CM | POA: Diagnosis not present

## 2023-12-15 DIAGNOSIS — N390 Urinary tract infection, site not specified: Secondary | ICD-10-CM | POA: Diagnosis not present

## 2023-12-15 LAB — COMPREHENSIVE METABOLIC PANEL
ALT: 15 U/L (ref 0–44)
AST: 24 U/L (ref 15–41)
Albumin: 2.8 g/dL — ABNORMAL LOW (ref 3.5–5.0)
Alkaline Phosphatase: 96 U/L (ref 38–126)
Anion gap: 9 (ref 5–15)
BUN: 6 mg/dL (ref 6–20)
CO2: 25 mmol/L (ref 22–32)
Calcium: 9 mg/dL (ref 8.9–10.3)
Chloride: 102 mmol/L (ref 98–111)
Creatinine, Ser: 0.6 mg/dL (ref 0.44–1.00)
GFR, Estimated: >60 mL/min (ref >60)
Glucose, Bld: 89 mg/dL (ref 70–99)
Potassium: 4.2 mmol/L (ref 3.5–5.1)
Sodium: 136 mmol/L (ref 135–145)
Total Bilirubin: 0.3 mg/dL (ref <1.2)
Total Protein: 6.6 g/dL (ref 6.5–8.1)

## 2023-12-15 LAB — CBC
HCT: 34.6 % — ABNORMAL LOW (ref 36.0–46.0)
Hemoglobin: 10.5 g/dL — ABNORMAL LOW (ref 12.0–15.0)
MCH: 28.8 pg (ref 26.0–34.0)
MCHC: 30.3 g/dL (ref 30.0–36.0)
MCV: 94.8 fL (ref 80.0–100.0)
Platelets: 440 10*3/uL — ABNORMAL HIGH (ref 150–400)
RBC: 3.65 MIL/uL — ABNORMAL LOW (ref 3.87–5.11)
RDW: 13.6 % (ref 11.5–15.5)
WBC: 6.7 10*3/uL (ref 4.0–10.5)
nRBC: 0 % (ref 0.0–0.2)

## 2023-12-15 LAB — HEPARIN LEVEL (UNFRACTIONATED): Heparin Unfractionated: 0.43 [IU]/mL (ref 0.30–0.70)

## 2023-12-15 MED ORDER — PHENOL 1.4 % MT LIQD: 2.0000 | OROMUCOSAL | Status: DC | PRN

## 2023-12-15 MED ORDER — SALINE SPRAY 0.65 % NA SOLN: 1.0000 | Freq: Four times a day (QID) | NASAL | Status: DC | PRN

## 2023-12-15 MED ORDER — METHOCARBAMOL 1000 MG/10ML IJ SOLN: 1000.0000 mg | Freq: Four times a day (QID) | INTRAMUSCULAR | Status: DC | PRN

## 2023-12-15 MED ORDER — LACTATED RINGERS IV BOLUS: 1000.0000 mL | Freq: Three times a day (TID) | INTRAVENOUS | Status: DC | PRN

## 2023-12-15 MED ORDER — APIXABAN 5 MG PO TABS: 5.0000 mg | ORAL_TABLET | Freq: Two times a day (BID) | ORAL | Status: DC

## 2023-12-15 MED ORDER — OXYCODONE-ACETAMINOPHEN 5-325 MG PO TABS
1.0000 | ORAL_TABLET | ORAL | Status: DC | PRN
  Administered 2023-12-16 (×2): 1 via ORAL
  Filled 2023-12-15 (×3): qty 1

## 2023-12-15 MED ORDER — CALCIUM POLYCARBOPHIL 625 MG PO TABS
625.0000 mg | ORAL_TABLET | Freq: Two times a day (BID) | ORAL | Status: DC
Start: 2023-12-15 — End: 2023-12-16
  Administered 2023-12-15 – 2023-12-16 (×3): 625 mg via ORAL
  Filled 2023-12-15 (×5): qty 1

## 2023-12-15 MED ORDER — BISACODYL 10 MG RE SUPP
10.0000 mg | Freq: Two times a day (BID) | RECTAL | Status: DC | PRN
  Administered 2023-12-15: 10 mg via RECTAL
  Filled 2023-12-15: qty 1

## 2023-12-15 MED ORDER — ALUM & MAG HYDROXIDE-SIMETH 200-200-20 MG/5ML PO SUSP
30.0000 mL | Freq: Four times a day (QID) | ORAL | Status: DC | PRN
  Administered 2023-12-15: 30 mL via ORAL
  Filled 2023-12-15: qty 30

## 2023-12-15 MED ORDER — SIMETHICONE 40 MG/0.6ML PO SUSP: 80.0000 mg | Freq: Four times a day (QID) | ORAL | Status: DC | PRN

## 2023-12-15 MED ORDER — MENTHOL 3 MG MT LOZG: 1.0000 | LOZENGE | OROMUCOSAL | Status: DC | PRN

## 2023-12-15 MED ORDER — MAGIC MOUTHWASH: 15.0000 mL | Freq: Four times a day (QID) | ORAL | Status: DC | PRN

## 2023-12-15 MED ORDER — METHOCARBAMOL 500 MG PO TABS: 1000.0000 mg | ORAL_TABLET | Freq: Four times a day (QID) | ORAL | Status: DC | PRN

## 2023-12-15 MED ORDER — APIXABAN 5 MG PO TABS
10.0000 mg | ORAL_TABLET | Freq: Two times a day (BID) | ORAL | Status: DC
  Administered 2023-12-15 – 2023-12-16 (×3): 10 mg via ORAL
  Filled 2023-12-15 (×3): qty 2

## 2023-12-15 MED ORDER — NAPHAZOLINE-GLYCERIN 0.012-0.25 % OP SOLN: 1.0000 [drp] | Freq: Four times a day (QID) | OPHTHALMIC | Status: DC | PRN

## 2023-12-15 MED ORDER — PROCHLORPERAZINE EDISYLATE 10 MG/2ML IJ SOLN: 5.0000 mg | INTRAMUSCULAR | Status: DC | PRN

## 2023-12-15 NOTE — Progress Notes (Signed)
[facility] Progress Note     Subjective: CC:  Patient doing better. Pain much improved.  + BMs and flatus.  Still feels a bit bloated.  Tolerated fulls. No n/v.   Objective: Vital signs in last 24 hours: Temp:  [97.7 F (36.5 C)-98.9 F (37.2 C)] 98.1 F (36.7 C) (12/28 0800) Pulse Rate:  [67-159] 83 (12/28 0800) Resp:  [14-35] 17 (12/28 0800) BP: (90-154)/(48-102) 90/63 (12/28 0800) SpO2:  [96 %-100 %] 97 % (12/28 0800) Weight:  [87.5 kg] 87.5 kg (12/27 1109) Last BM Date : 12/14/23  Intake/Output from previous day: 12/27 0701 - 12/28 0700 In: 387.5 [I.V.:294.9; IV Piggyback:92.5] Out: 1375 [Urine:1375] Intake/Output this shift: Total I/O In: 15.9 [I.V.:15.9] Out: -   PE: Gen:  Alert, NAD, pleasant Card:  Regular rate and rhythm Pulm:  Normal effort ORA Abd: Soft, overall non-tender, mild distention, incisions c/d/I, no guarding, no rebound tenderness Skin: warm and dry, no rashes  Psych: A&Ox3   Lab Results:  Recent Labs    12/14/23 1218 12/15/23 0312  WBC 6.7 6.7  HGB 10.4* 10.5*  HCT 32.5* 34.6*  PLT 476* 440*   BMET Recent Labs    12/14/23 1218 12/15/23 0312  NA 132* 136  K 4.2 4.2  CL 100 102  CO2 24 25  GLUCOSE 94 89  BUN 7 6  CREATININE 0.54 0.60  CALCIUM 8.8* 9.0   PT/INR No results for input(s): "LABPROT", "INR" in the last 72 hours. CMP     Component Value Date/Time   NA 136 12/15/2023 0312   NA 138 09/23/2023 0000   K 4.2 12/15/2023 0312   CL 102 12/15/2023 0312   CO2 25 12/15/2023 0312   GLUCOSE 89 12/15/2023 0312   BUN 6 12/15/2023 0312   BUN 9 09/23/2023 0000   CREATININE 0.60 12/15/2023 0312   CREATININE 0.60 04/06/2016 1700   CALCIUM 9.0 12/15/2023 0312   PROT 6.6 12/15/2023 0312   PROT 6.9 03/28/2023 1000   ALBUMIN 2.8 (L) 12/15/2023 0312   ALBUMIN 4.2 03/28/2023 1000   AST 24 12/15/2023 0312   ALT 15 12/15/2023 0312   ALKPHOS 96 12/15/2023 0312   BILITOT 0.3 12/15/2023 0312   BILITOT <0.2 03/28/2023  1000   GFRNONAA >60 12/15/2023 0312   GFRNONAA >89 04/06/2016 1700   GFRAA >60 04/23/2019 0105   GFRAA >89 04/06/2016 1700   Lipase     Component Value Date/Time   LIPASE 25 12/04/2023 2123       Studies/Results: VAS [place] LOWER EXTREMITY VENOUS (DVT) Result Date: 12/14/2023  Lower Venous DVT Study Patient Name:  Amil Amen  Date of Exam:   12/14/2023 Medical Rec #: [id number]            Accession #:    [id number] Date of Birth: October 29, 1981            Patient Gender: F Patient Age:   7 years Exam Location:  [facility] Procedure:      VAS [place] LOWER EXTREMITY VENOUS (DVT) Referring Phys: DAVID ORTIZ --------------------------------------------------------------------------------  Indications: Pulmonary embolism.  Risk Factors: Confirmed PE. Anticoagulation: Heparin. Limitations: Poor ultrasound/tissue interface. Comparison Study: No prior studies. Performing Technologist: Chanda Busing RVT  Examination Guidelines: A complete evaluation includes B-mode imaging, spectral Doppler, color Doppler, and power Doppler as needed of all accessible portions of each vessel. Bilateral testing is considered an integral part of a complete examination. Limited examinations for reoccurring indications may be performed as noted. The reflux  portion of the exam is performed with the patient in reverse Trendelenburg.  +---------+---------------+---------+-----------+----------+--------------+ RIGHT    CompressibilityPhasicitySpontaneityPropertiesThrombus Aging +---------+---------------+---------+-----------+----------+--------------+ CFV      Full           Yes      Yes                                 +---------+---------------+---------+-----------+----------+--------------+ SFJ      Full                                                        +---------+---------------+---------+-----------+----------+--------------+ FV Prox  Full                                                         +---------+---------------+---------+-----------+----------+--------------+ FV Mid   Full                                                        +---------+---------------+---------+-----------+----------+--------------+ FV DistalFull                                                        +---------+---------------+---------+-----------+----------+--------------+ PFV      Full                                                        +---------+---------------+---------+-----------+----------+--------------+ POP      Full           Yes      Yes                                 +---------+---------------+---------+-----------+----------+--------------+ PTV      Full                                                        +---------+---------------+---------+-----------+----------+--------------+ PERO     Full                                                        +---------+---------------+---------+-----------+----------+--------------+   +---------+---------------+---------+-----------+----------+--------------+ LEFT     CompressibilityPhasicitySpontaneityPropertiesThrombus Aging +---------+---------------+---------+-----------+----------+--------------+ CFV      Full           Yes  Yes                                 +---------+---------------+---------+-----------+----------+--------------+ SFJ      Full                                                        +---------+---------------+---------+-----------+----------+--------------+ FV Prox  Full                                                        +---------+---------------+---------+-----------+----------+--------------+ FV Mid   Full                                                        +---------+---------------+---------+-----------+----------+--------------+ FV DistalFull           Yes      Yes                                  +---------+---------------+---------+-----------+----------+--------------+ PFV      Full                                                        +---------+---------------+---------+-----------+----------+--------------+ POP      Full           Yes      Yes                                 +---------+---------------+---------+-----------+----------+--------------+ PTV      Full                                                        +---------+---------------+---------+-----------+----------+--------------+ PERO     Full                                                        +---------+---------------+---------+-----------+----------+--------------+     Summary: RIGHT: - There is no evidence of deep vein thrombosis in the lower extremity.  - No cystic structure found in the popliteal fossa.  LEFT: - There is no evidence of deep vein thrombosis in the lower extremity.  - No cystic structure found in the popliteal fossa.  *See table(s) above for measurements and observations. Electronically signed by Heath Lark on 12/14/2023 at 9:21:06 PM.    Final    ECHOCARDIOGRAM COMPLETE Result Date: 12/14/2023  ECHOCARDIOGRAM REPORT   Patient Name:   Amil Amen Date of Exam: 12/14/2023 Medical Rec #:  [id number]           Height:       60.0 in Accession #:    [id number]          Weight:       187.0 lb Date of Birth:  Jun 02, 1981           BSA:          1.814 m Patient Age:    42 years            BP:           107/48 mmHg Patient Gender: F                   HR:           82 bpm. Exam Location:  Inpatient Procedure: 2D Echo, Cardiac Doppler and Color Doppler Indications:    I26.02 Pulmonary embolus  History:        Patient has prior history of Echocardiogram examinations, most                 recent 07/27/2017. Signs/Symptoms:Syncope and Edema; Risk                 Factors:Former Smoker.  Sonographer:    Sheralyn Boatman RDCS Referring Phys: [id number] DAVID MANUEL ORTIZ  Sonographer Comments: Patient  had recent abdominal surgery. IMPRESSIONS  1. Left ventricular ejection fraction, by estimation, is 55 to 60%. The left ventricle has normal function. The left ventricle has no regional wall motion abnormalities. Left ventricular diastolic parameters were normal.  2. Right ventricular systolic function is normal. The right ventricular size is normal.  3. The mitral valve is normal in structure. Trivial mitral valve regurgitation. No evidence of mitral stenosis.  4. The aortic valve is tricuspid. Aortic valve regurgitation is not visualized. No aortic stenosis is present.  5. The inferior vena cava is normal in size with greater than 50% respiratory variability, suggesting right atrial pressure of 3 mmHg. Comparison(s): No prior Echocardiogram. Conclusion(s)/Recommendation(s): No evidence of right heart strain. FINDINGS  Left Ventricle: Left ventricular ejection fraction, by estimation, is 55 to 60%. The left ventricle has normal function. The left ventricle has no regional wall motion abnormalities. The left ventricular internal cavity size was normal in size. There is  no left ventricular hypertrophy. Left ventricular diastolic parameters were normal. Right Ventricle: The right ventricular size is normal. Right ventricular systolic function is normal. Left Atrium: Left atrial size was normal in size. Right Atrium: Right atrial size was normal in size. Pericardium: There is no evidence of pericardial effusion. Mitral Valve: The mitral valve is normal in structure. Trivial mitral valve regurgitation. No evidence of mitral valve stenosis. Tricuspid Valve: The tricuspid valve is normal in structure. Tricuspid valve regurgitation is trivial. No evidence of tricuspid stenosis. Aortic Valve: The aortic valve is tricuspid. Aortic valve regurgitation is not visualized. No aortic stenosis is present. Pulmonic Valve: The pulmonic valve was normal in structure. Pulmonic valve regurgitation is not visualized. No evidence of  pulmonic stenosis. Aorta: The aortic root and ascending aorta are structurally normal, with no evidence of dilitation. Venous: The inferior vena cava is normal in size with greater than 50% respiratory variability, suggesting right atrial pressure of 3 mmHg. IAS/Shunts: No atrial level shunt detected by color flow Doppler.  LEFT VENTRICLE PLAX 2D LVIDd:  4.10 cm      Diastology LVIDs:         2.80 cm      LV e' medial:    10.40 cm/s LV PW:         1.00 cm      LV E/e' medial:  8.0 LV IVS:        1.10 cm      LV e' lateral:   15.10 cm/s LVOT diam:     2.20 cm      LV E/e' lateral: 5.5 LV SV:         91 LV SV Index:   50 LVOT Area:     3.80 cm  LV Volumes (MOD) LV vol d, MOD A2C: 108.0 ml LV vol d, MOD A4C: 95.6 ml LV vol s, MOD A2C: 44.1 ml LV vol s, MOD A4C: 38.7 ml LV SV MOD A2C:     63.9 ml LV SV MOD A4C:     95.6 ml LV SV MOD BP:      60.5 ml RIGHT VENTRICLE             IVC RV S prime:     14.10 cm/s  IVC diam: 1.50 cm TAPSE (M-mode): 2.3 cm LEFT ATRIUM             Index        RIGHT ATRIUM          Index LA diam:        4.20 cm 2.32 cm/m   RA Area:     8.25 cm LA Vol (A2C):   30.2 ml 16.65 ml/m  RA Volume:   14.60 ml 8.05 ml/m LA Vol (A4C):   41.5 ml 22.88 ml/m LA Biplane Vol: 35.7 ml 19.68 ml/m  AORTIC VALVE LVOT Vmax:   130.00 cm/s LVOT Vmean:  85.700 cm/s LVOT VTI:    0.240 m  AORTA Ao Root diam: 2.60 cm Ao Asc diam:  2.70 cm MITRAL VALVE MV Area (PHT): 5.02 cm    SHUNTS MV Decel Time: 151 msec    Systemic VTI:  0.24 m MV E velocity: 83.20 cm/s  Systemic Diam: 2.20 cm MV A velocity: 61.20 cm/s MV E/A ratio:  1.36 Olga Millers MD Electronically signed by Olga Millers MD Signature Date/Time: 12/14/2023/3:04:04 PM    Final    CT Angio Chest PE W and/or Wo Contrast Result Date: 12/13/2023 CLINICAL DATA:  Shortness of breath with left lower extremity pain and suprapubic pressure. EXAM: CT ANGIOGRAPHY CHEST WITH CONTRAST TECHNIQUE: Multidetector CT imaging of the chest was performed using the  standard protocol during bolus administration of intravenous contrast. Multiplanar CT image reconstructions and MIPs were obtained to evaluate the vascular anatomy. RADIATION DOSE REDUCTION: This exam was performed according to the departmental dose-optimization program which includes automated exposure control, adjustment of the mA and/or kV according to patient size and/or use of iterative reconstruction technique. CONTRAST:  OMNIPAQUE IOHEXOL 350 MG/ML SOLN COMPARISON:  Apr 23, 2019 FINDINGS: Cardiovascular: The thoracic aorta is unremarkable. Satisfactory opacification of the pulmonary arteries to the segmental level. A mild amount of intraluminal low attenuation is seen involving multiple posterior upper lobe branches of the right pulmonary artery. Normal heart size. No right heart strain is identified (RV/LV ratio of 0.98). No pericardial effusion. Mediastinum/Nodes: No enlarged mediastinal, hilar, or axillary lymph nodes. Thyroid gland, trachea, and esophagus demonstrate no significant findings. Lungs/Pleura: Mild linear scarring and/or atelectasis is seen within the bilateral lung bases, left  greater than right. No pleural effusion or pneumothorax is identified. Upper Abdomen: There is a small hiatal hernia with numerous surgical sutures seen throughout the gastric region. Musculoskeletal: No chest wall abnormality. No acute or significant osseous findings. Review of the MIP images confirms the above findings. IMPRESSION: 1. Mild amount of pulmonary embolism involving multiple upper lobe branches of the right pulmonary artery. 2. No evidence of associated right heart strain. 3. Mild bibasilar linear scarring and/or atelectasis, left greater than right. 4. 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 Electronically Signed   By: Aram Candela M.D.   On: 12/13/2023 19:39   CT ABDOMEN PELVIS W CONTRAST Result Date: 12/13/2023 CLINICAL DATA:  Suprapubic pressure and left lower  back pain. EXAM: CT ABDOMEN AND PELVIS WITH CONTRAST TECHNIQUE: Multidetector CT imaging of the abdomen and pelvis was performed using the standard protocol following bolus administration of intravenous contrast. RADIATION DOSE REDUCTION: This exam was performed according to the departmental dose-optimization program which includes automated exposure control, adjustment of the mA and/or kV according to patient size and/or use of iterative reconstruction technique. CONTRAST:  OMNIPAQUE IOHEXOL 350 MG/ML SOLN COMPARISON:  December 05, 2023 FINDINGS: Lower chest: Mild atelectasis is seen within the bilateral lung bases. Hepatobiliary: No focal liver abnormality is seen. Status post cholecystectomy. No biliary dilatation. Pancreas: Unremarkable. No pancreatic ductal dilatation or surrounding inflammatory changes. Spleen: Normal in size without focal abnormality. Adrenals/Urinary Tract: Adrenal glands are unremarkable. Kidneys are normal, without renal calculi, focal lesion, or hydronephrosis. The urinary bladder is poorly distended and subsequently limited in evaluation. Stomach/Bowel: There is a small hiatal hernia. Surgical sutures are seen throughout the gastric region with surgically anastomosed bowel also noted within the anterior aspect of the mid abdomen. Appendix appears normal. Multiple dilated loops of air and fluid filled jejunum are seen (maximum small bowel diameter of approximately 4.1 cm). A clear transition zone is not identified. Vascular/Lymphatic: No significant vascular findings are present. No enlarged abdominal or pelvic lymph nodes. Reproductive: The uterus is heterogeneous in appearance. A 2.1 cm diameter tortuous fluid-filled tubular appearing structure is seen along the posterior aspect of the left adnexa. This represents a new finding when compared to the prior exam. Other: No abdominal wall hernia or abnormality. No abdominopelvic ascites. Musculoskeletal: No acute or significant  osseous findings. IMPRESSION: 1. Findings consistent with a partial small bowel obstruction versus ileus. 2. Findings which may represent a left-sided hydrosalpinx. Correlation with pelvic ultrasound is recommended. 3. Evidence of prior cholecystectomy. 4. Small hiatal hernia. Electronically Signed   By: Aram Candela M.D.   On: 12/13/2023 19:32    Anti-infectives: Anti-infectives (From admission, onward)    Start     Dose/Rate Route Frequency Ordered Stop   12/14/23 1600  cefTRIAXone (ROCEPHIN) 1 g in sodium chloride 0.9 % 100 mL IVPB        1 g 200 mL/hr over 30 Minutes Intravenous Daily 12/14/23 1506          Assessment/Plan Post-operative ileus vs pSBO, favor ileus Acute pulmonary embolus 42 y/o F with hx RYGB 07/2022 at [facility] 2023 who was recently admitted here due to internal hernia. She is s/p  diagnostic laparoscopy, cholecystectomy, internal hernia repair 12/18 by Dr. Freida Busman (POD#9). She was discharged on POD#2. She returned with intermittent abdominal pain, urinary sxs, SOB, and calf discomfort. She was diagnosed with a PE and admitted to the hospital. CT scan showed ileus vs SBO. Continue stool softeners, monitor.   Abd pain  seems musculoskeletal and responded to muscle relaxants.  Soft diet today.   Currently on hep gtt for acute PE. Urine cx pending, mgmt UTI per primary team.    LOS: 1 day   I reviewed nursing notes, ED provider notes, hospitalist notes, last 24 h vitals and pain scores, last 48 h intake and output, last 24 h labs and trends, and last 24 h imaging results.  This care required moderate level of medical decision making.  Maudry Diego, MD, FACS, FSSO Surgical Oncology, General Surgery, Trauma and Critical [facility], [place] [phone] for weekday/non holidays Check [email or web address] for coverage night/weekend/holidays

## 2023-12-15 NOTE — Progress Notes (Signed)
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  Had some calf discomfort but without any chest pain or shortness of breath. In the emergency room urine was abnormal.  CT scan abdomen pelvis consistent with ileus versus postop finding.  CT angiogram of the chest showed multiple small pulmonary embolism on the right upper pulmonary artery.  Admitted with surgical consultation.  Subjective: Patient seen and examined.  She has some abdominal discomfort but much better.  Tolerating clears.  Denies any nausea vomiting.  She had large bowel movement last night.  Still has some hesitancy at the end of urinary stream. Advance to soft diet Change to Eliquis.  MedSurg bed.  Assessment & Plan:   Acute pulmonary embolism: Patient does have postoperative pulmonary embolism.  Duplex is negative for DVT.  No right heart strain.  Provoked PE after surgery, recommend 3 to 6 months of anticoagulation.  Agreeable to go on Eliquis.  Starting today.  Abdominal pain/ileus: Likely abdominal discomfort, incisional pain and postop ileus.  Clinically improved.  Will advance to soft diet and monitor.  Once he is able to tolerate diet, will put her on stool softener.  Suspected UTI: She has already been treated with nitrofurantoin.  Urine culture was not collected during visit to urgent care as well as on admission here.  On Rocephin.  Will try to send urine culture on previous urine.  Continue Rocephin.  If negative cultures, will treat with 3 days of Rocephin therapy.  Mobilize.  Transfer to MedSurg.      DVT prophylaxis:  apixaban (ELIQUIS) tablet 10 mg   apixaban (ELIQUIS) tablet 5 mg   Code Status: Full code Family Communication: None Disposition Plan: Status is: Inpatient Remains inpatient appropriate because: Diet tolerance, IV antibiotics     Consultants:  General surgery  Procedures:  None  Antimicrobials:  Rocephin 12/27----     Objective: Vitals:   12/15/23 0600 12/15/23 0700 12/15/23 0726 12/15/23 0800  BP: 109/62  116/67 90/63  Pulse: 67 70 78 83  Resp: 16 16 16 17   Temp:    98.1 F (36.7 C)  TempSrc:    Oral  SpO2: 97% 99% 100% 97%  Weight:      Height:        Intake/Output Summary (Last 24 hours) at 12/15/2023 1010 Last data filed at 12/15/2023 0800 Gross per 24 hour  Intake 403.39 ml  Output 1375 ml  Net -971.61 ml   Filed Weights   12/13/23 1959 12/14/23 1109  Weight: 84.8 kg 87.5 kg    Examination:  General exam: Appears calm and comfortable .  Pleasant interaction. Respiratory system: Clear to auscultation. Respiratory effort normal.  No added sounds.  On room air. Cardiovascular system: S1 & S2 heard, RRR.  No pedal edema. Gastrointestinal system: Soft.  Mildly distended.  Mild tenderness along the ports but no rigidity or guarding.  Bowel sound present. Central nervous system: Alert and oriented. No focal neurological deficits. Extremities: Symmetric 5 x 5 power. Skin: No rashes, lesions or ulcers Psychiatry: Judgement and insight appear normal. Mood & affect appropriate.     Data Reviewed: I have personally reviewed following labs and imaging studies  CBC: Recent  Labs  Lab 12/13/23 1741 12/14/23 1218 12/15/23 0312  WBC 7.0 6.7 6.7  HGB 10.5* 10.4* 10.5*  HCT 33.0* 32.5* 34.6*  MCV 90.9 93.1 94.8  PLT 459* 476* 440*   Basic Metabolic Panel: Recent Labs  Lab 12/13/23 1741 12/14/23 1218 12/15/23 0312  NA 139 132* 136  K 3.8 4.2 4.2  CL 103 100 102  CO2 28 24 25   GLUCOSE 137* 94 89  BUN 12 7 6   CREATININE 0.67 0.54 0.60  CALCIUM 8.9 8.8* 9.0  MG  --  2.3  --    PHOS  --  3.7  --    GFR: Estimated Creatinine Clearance: 91 mL/min (by C-G formula based on SCr of 0.6 mg/dL). Liver Function Tests: Recent Labs  Lab 12/14/23 1218 12/15/23 0312  AST 21 24  ALT 12 15  ALKPHOS 106 96  BILITOT 0.3 0.3  PROT 7.0 6.6  ALBUMIN 3.0* 2.8*   No results for input(s): "LIPASE", "AMYLASE" in the last 168 hours. No results for input(s): "AMMONIA" in the last 168 hours. Coagulation Profile: No results for input(s): "INR", "PROTIME" in the last 168 hours. Cardiac Enzymes: No results for input(s): "CKTOTAL", "CKMB", "CKMBINDEX", "TROPONINI" in the last 168 hours. BNP (last 3 results) No results for input(s): "PROBNP" in the last 8760 hours. HbA1C: No results for input(s): "HGBA1C" in the last 72 hours. CBG: No results for input(s): "GLUCAP" in the last 168 hours. Lipid Profile: No results for input(s): "CHOL", "HDL", "LDLCALC", "TRIG", "CHOLHDL", "LDLDIRECT" in the last 72 hours. Thyroid Function Tests: No results for input(s): "TSH", "T4TOTAL", "FREET4", "T3FREE", "THYROIDAB" in the last 72 hours. Anemia Panel: No results for input(s): "VITAMINB12", "FOLATE", "FERRITIN", "TIBC", "IRON", "RETICCTPCT" in the last 72 hours. Sepsis Labs: No results for input(s): "PROCALCITON", "LATICACIDVEN" in the last 168 hours.  Recent Results (from the past 240 hours)  Surgical pcr screen     Status: None   Collection Time: 12/05/23 10:18 AM   Specimen: Nasal Mucosa; Nasal Swab  Result Value Ref Range Status   MRSA, PCR NEGATIVE NEGATIVE Final   Staphylococcus aureus NEGATIVE NEGATIVE Final    Comment: (NOTE) The Xpert SA Assay (FDA approved for NASAL specimens in patients 53 years of age and older), is one component of a comprehensive surveillance program. It is not intended to diagnose infection nor to guide or monitor treatment. Performed at [facility], [address], [place], [place] [address]          Radiology Studies: VAS  [place] LOWER EXTREMITY VENOUS (DVT) Result Date: 12/14/2023  Lower Venous DVT Study Patient Name:  Jessica Banks  Date of Exam:   12/14/2023 Medical Rec #: [id number]            Accession #:    [id number] Date of Birth: 1981/11/26            Patient Gender: F Patient Age:   37 years Exam Location:  [facility] Procedure:      VAS [place] LOWER EXTREMITY VENOUS (DVT) Referring Phys: DAVID ORTIZ --------------------------------------------------------------------------------  Indications: Pulmonary embolism.  Risk Factors: Confirmed PE. Anticoagulation: Heparin. Limitations: Poor ultrasound/tissue interface. Comparison Study: No prior studies. Performing Technologist: Chanda Busing RVT  Examination Guidelines: A complete evaluation includes B-mode imaging, spectral Doppler, color Doppler, and power Doppler as needed of all accessible portions of each vessel. Bilateral testing is considered an integral part of a complete examination. Limited examinations for reoccurring indications may be performed as noted. The reflux portion  of the exam is performed with the patient in reverse Trendelenburg.  +---------+---------------+---------+-----------+----------+--------------+ RIGHT    CompressibilityPhasicitySpontaneityPropertiesThrombus Aging +---------+---------------+---------+-----------+----------+--------------+ CFV      Full           Yes      Yes                                 +---------+---------------+---------+-----------+----------+--------------+ SFJ      Full                                                        +---------+---------------+---------+-----------+----------+--------------+ FV Prox  Full                                                        +---------+---------------+---------+-----------+----------+--------------+ FV Mid   Full                                                         +---------+---------------+---------+-----------+----------+--------------+ FV DistalFull                                                        +---------+---------------+---------+-----------+----------+--------------+ PFV      Full                                                        +---------+---------------+---------+-----------+----------+--------------+ POP      Full           Yes      Yes                                 +---------+---------------+---------+-----------+----------+--------------+ PTV      Full                                                        +---------+---------------+---------+-----------+----------+--------------+ PERO     Full                                                        +---------+---------------+---------+-----------+----------+--------------+   +---------+---------------+---------+-----------+----------+--------------+ LEFT     CompressibilityPhasicitySpontaneityPropertiesThrombus Aging +---------+---------------+---------+-----------+----------+--------------+ CFV      Full           Yes      Yes                                 +---------+---------------+---------+-----------+----------+--------------+  SFJ      Full                                                        +---------+---------------+---------+-----------+----------+--------------+ FV Prox  Full                                                        +---------+---------------+---------+-----------+----------+--------------+ FV Mid   Full                                                        +---------+---------------+---------+-----------+----------+--------------+ FV DistalFull           Yes      Yes                                 +---------+---------------+---------+-----------+----------+--------------+ PFV      Full                                                         +---------+---------------+---------+-----------+----------+--------------+ POP      Full           Yes      Yes                                 +---------+---------------+---------+-----------+----------+--------------+ PTV      Full                                                        +---------+---------------+---------+-----------+----------+--------------+ PERO     Full                                                        +---------+---------------+---------+-----------+----------+--------------+     Summary: RIGHT: - There is no evidence of deep vein thrombosis in the lower extremity.  - No cystic structure found in the popliteal fossa.  LEFT: - There is no evidence of deep vein thrombosis in the lower extremity.  - No cystic structure found in the popliteal fossa.  *See table(s) above for measurements and observations. Electronically signed by Heath Lark on 12/14/2023 at 9:21:06 PM.    Final    ECHOCARDIOGRAM COMPLETE Result Date: 12/14/2023    ECHOCARDIOGRAM REPORT   Patient Name:   Jessica Banks Date of Exam: 12/14/2023 Medical Rec #:  [id number]           Height:  60.0 in Accession #:    [id number]          Weight:       187.0 lb Date of Birth:  01-21-1981           BSA:          1.814 m Patient Age:    42 years            BP:           107/48 mmHg Patient Gender: F                   HR:           82 bpm. Exam Location:  Inpatient Procedure: 2D Echo, Cardiac Doppler and Color Doppler Indications:    I26.02 Pulmonary embolus  History:        Patient has prior history of Echocardiogram examinations, most                 recent 07/27/2017. Signs/Symptoms:Syncope and Edema; Risk                 Factors:Former Smoker.  Sonographer:    Sheralyn Boatman RDCS Referring Phys: [id number] DAVID MANUEL ORTIZ  Sonographer Comments: Patient had recent abdominal surgery. IMPRESSIONS  1. Left ventricular ejection fraction, by estimation, is 55 to 60%. The left ventricle has normal function.  The left ventricle has no regional wall motion abnormalities. Left ventricular diastolic parameters were normal.  2. Right ventricular systolic function is normal. The right ventricular size is normal.  3. The mitral valve is normal in structure. Trivial mitral valve regurgitation. No evidence of mitral stenosis.  4. The aortic valve is tricuspid. Aortic valve regurgitation is not visualized. No aortic stenosis is present.  5. The inferior vena cava is normal in size with greater than 50% respiratory variability, suggesting right atrial pressure of 3 mmHg. Comparison(s): No prior Echocardiogram. Conclusion(s)/Recommendation(s): No evidence of right heart strain. FINDINGS  Left Ventricle: Left ventricular ejection fraction, by estimation, is 55 to 60%. The left ventricle has normal function. The left ventricle has no regional wall motion abnormalities. The left ventricular internal cavity size was normal in size. There is  no left ventricular hypertrophy. Left ventricular diastolic parameters were normal. Right Ventricle: The right ventricular size is normal. Right ventricular systolic function is normal. Left Atrium: Left atrial size was normal in size. Right Atrium: Right atrial size was normal in size. Pericardium: There is no evidence of pericardial effusion. Mitral Valve: The mitral valve is normal in structure. Trivial mitral valve regurgitation. No evidence of mitral valve stenosis. Tricuspid Valve: The tricuspid valve is normal in structure. Tricuspid valve regurgitation is trivial. No evidence of tricuspid stenosis. Aortic Valve: The aortic valve is tricuspid. Aortic valve regurgitation is not visualized. No aortic stenosis is present. Pulmonic Valve: The pulmonic valve was normal in structure. Pulmonic valve regurgitation is not visualized. No evidence of pulmonic stenosis. Aorta: The aortic root and ascending aorta are structurally normal, with no evidence of dilitation. Venous: The inferior vena cava is  normal in size with greater than 50% respiratory variability, suggesting right atrial pressure of 3 mmHg. IAS/Shunts: No atrial level shunt detected by color flow Doppler.  LEFT VENTRICLE PLAX 2D LVIDd:         4.10 cm      Diastology LVIDs:         2.80 cm      LV e' medial:    10.40 cm/s LV PW:  1.00 cm      LV E/e' medial:  8.0 LV IVS:        1.10 cm      LV e' lateral:   15.10 cm/s LVOT diam:     2.20 cm      LV E/e' lateral: 5.5 LV SV:         91 LV SV Index:   50 LVOT Area:     3.80 cm  LV Volumes (MOD) LV vol d, MOD A2C: 108.0 ml LV vol d, MOD A4C: 95.6 ml LV vol s, MOD A2C: 44.1 ml LV vol s, MOD A4C: 38.7 ml LV SV MOD A2C:     63.9 ml LV SV MOD A4C:     95.6 ml LV SV MOD BP:      60.5 ml RIGHT VENTRICLE             IVC RV S prime:     14.10 cm/s  IVC diam: 1.50 cm TAPSE (M-mode): 2.3 cm LEFT ATRIUM             Index        RIGHT ATRIUM          Index LA diam:        4.20 cm 2.32 cm/m   RA Area:     8.25 cm LA Vol (A2C):   30.2 ml 16.65 ml/m  RA Volume:   14.60 ml 8.05 ml/m LA Vol (A4C):   41.5 ml 22.88 ml/m LA Biplane Vol: 35.7 ml 19.68 ml/m  AORTIC VALVE LVOT Vmax:   130.00 cm/s LVOT Vmean:  85.700 cm/s LVOT VTI:    0.240 m  AORTA Ao Root diam: 2.60 cm Ao Asc diam:  2.70 cm MITRAL VALVE MV Area (PHT): 5.02 cm    SHUNTS MV Decel Time: 151 msec    Systemic VTI:  0.24 m MV E velocity: 83.20 cm/s  Systemic Diam: 2.20 cm MV A velocity: 61.20 cm/s MV E/A ratio:  1.36 Olga Millers MD Electronically signed by Olga Millers MD Signature Date/Time: 12/14/2023/3:04:04 PM    Final    CT Angio Chest PE W and/or Wo Contrast Result Date: 12/13/2023 CLINICAL DATA:  Shortness of breath with left lower extremity pain and suprapubic pressure. EXAM: CT ANGIOGRAPHY CHEST WITH CONTRAST TECHNIQUE: Multidetector CT imaging of the chest was performed using the standard protocol during bolus administration of intravenous contrast. Multiplanar CT image reconstructions and MIPs were obtained to evaluate the  vascular anatomy. RADIATION DOSE REDUCTION: This exam was performed according to the departmental dose-optimization program which includes automated exposure control, adjustment of the mA and/or kV according to patient size and/or use of iterative reconstruction technique. CONTRAST:  OMNIPAQUE IOHEXOL 350 MG/ML SOLN COMPARISON:  Apr 23, 2019 FINDINGS: Cardiovascular: The thoracic aorta is unremarkable. Satisfactory opacification of the pulmonary arteries to the segmental level. A mild amount of intraluminal low attenuation is seen involving multiple posterior upper lobe branches of the right pulmonary artery. Normal heart size. No right heart strain is identified (RV/LV ratio of 0.98). No pericardial effusion. Mediastinum/Nodes: No enlarged mediastinal, hilar, or axillary lymph nodes. Thyroid gland, trachea, and esophagus demonstrate no significant findings. Lungs/Pleura: Mild linear scarring and/or atelectasis is seen within the bilateral lung bases, left greater than right. No pleural effusion or pneumothorax is identified. Upper Abdomen: There is a small hiatal hernia with numerous surgical sutures seen throughout the gastric region. Musculoskeletal: No chest wall abnormality. No acute or significant osseous findings. Review of the MIP  images confirms the above findings. IMPRESSION: 1. Mild amount of pulmonary embolism involving multiple upper lobe branches of the right pulmonary artery. 2. No evidence of associated right heart strain. 3. Mild bibasilar linear scarring and/or atelectasis, left greater than right. 4. 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 Electronically Signed   By: Aram Candela M.D.   On: 12/13/2023 19:39   CT ABDOMEN PELVIS W CONTRAST Result Date: 12/13/2023 CLINICAL DATA:  Suprapubic pressure and left lower back pain. EXAM: CT ABDOMEN AND PELVIS WITH CONTRAST TECHNIQUE: Multidetector CT imaging of the abdomen and pelvis was performed using the standard  protocol following bolus administration of intravenous contrast. RADIATION DOSE REDUCTION: This exam was performed according to the departmental dose-optimization program which includes automated exposure control, adjustment of the mA and/or kV according to patient size and/or use of iterative reconstruction technique. CONTRAST:  OMNIPAQUE IOHEXOL 350 MG/ML SOLN COMPARISON:  December 05, 2023 FINDINGS: Lower chest: Mild atelectasis is seen within the bilateral lung bases. Hepatobiliary: No focal liver abnormality is seen. Status post cholecystectomy. No biliary dilatation. Pancreas: Unremarkable. No pancreatic ductal dilatation or surrounding inflammatory changes. Spleen: Normal in size without focal abnormality. Adrenals/Urinary Tract: Adrenal glands are unremarkable. Kidneys are normal, without renal calculi, focal lesion, or hydronephrosis. The urinary bladder is poorly distended and subsequently limited in evaluation. Stomach/Bowel: There is a small hiatal hernia. Surgical sutures are seen throughout the gastric region with surgically anastomosed bowel also noted within the anterior aspect of the mid abdomen. Appendix appears normal. Multiple dilated loops of air and fluid filled jejunum are seen (maximum small bowel diameter of approximately 4.1 cm). A clear transition zone is not identified. Vascular/Lymphatic: No significant vascular findings are present. No enlarged abdominal or pelvic lymph nodes. Reproductive: The uterus is heterogeneous in appearance. A 2.1 cm diameter tortuous fluid-filled tubular appearing structure is seen along the posterior aspect of the left adnexa. This represents a new finding when compared to the prior exam. Other: No abdominal wall hernia or abnormality. No abdominopelvic ascites. Musculoskeletal: No acute or significant osseous findings. IMPRESSION: 1. Findings consistent with a partial small bowel obstruction versus ileus. 2. Findings which may represent a left-sided  hydrosalpinx. Correlation with pelvic ultrasound is recommended. 3. Evidence of prior cholecystectomy. 4. Small hiatal hernia. Electronically Signed   By: Aram Candela M.D.   On: 12/13/2023 19:32        Scheduled Meds:  apixaban  10 mg Oral BID   Followed by   Melene Muller ON 12/22/2023] apixaban  5 mg Oral BID   Chlorhexidine Gluconate Cloth  6 each Topical Daily   DULoxetine  60 mg Oral QHS   pantoprazole (PROTONIX) IV  40 mg Intravenous Daily   polycarbophil  625 mg Oral BID   polyethylene glycol  17 g Oral Daily   senna-docusate  1 tablet Oral BID   Continuous Infusions:  cefTRIAXone (ROCEPHIN)  IV 1 g (12/15/23 0947)   lactated ringers       LOS: 1 day    Time spent: 40 minutes    Dorcas Carrow, MD [facility]

## 2023-12-15 NOTE — Progress Notes (Signed)
ANTICOAGULATION CONSULT NOTE  Pharmacy Consult for Heparin Indication: pulmonary embolus Brief A/P: Heparin level subtherapeutic Increase Heparin rate  Allergies  Allergen Reactions   Novocain [Procaine] Anaphylaxis   Nsaids Other (See Comments)    H/o gastric bypass = avoid NSAIDs   Povidone Iodine Rash    Rash    Patient Measurements: Height: 5' (152.4 cm) Weight: 87.5 kg (192 lb 14.4 oz) IBW/kg (Calculated) : 45.5 Heparin Dosing Weight: 65.3 kg  Vital Signs: Temp: 98.1 F (36.7 C) (12/28 0800) Temp Source: Oral (12/28 0800) BP: 90/63 (12/28 0800) Pulse Rate: 83 (12/28 0800)  Labs: Recent Labs    12/13/23 1741 12/13/23 1932 12/14/23 0253 12/14/23 1218 12/14/23 2052 12/15/23 0312  HGB 10.5*  --   --  10.4*  --  10.5*  HCT 33.0*  --   --  32.5*  --  34.6*  PLT 459*  --   --  476*  --  440*  HEPARINUNFRC  --   --    < > 0.17* 0.38 0.43  CREATININE 0.67  --   --  0.54  --  0.60  TROPONINIHS 3 3  --   --   --   --    < > = values in this interval not displayed.    Estimated Creatinine Clearance: 91 mL/min (by C-G formula based on SCr of 0.6 mg/dL).  Assessment: 42 yo female s/p recent hernia repair, admitted with new PE and possible SBO. Pharmacy to dose IV heparin until need for invasive procedure ruled out.  Baseline INR, aPTT: not done Prior anticoagulation: none  Significant events:  Today, 12/15/2023: HL 0.43 therapeutic on 1600 units/hr Hgb 10.5, plts 440 No complications of therapy noted Pharmacy consulted to transition to Eliquis today  Plan: Begin Eliquis 10 mg PO bid x 7 days, followed by 5 mg PO bid thereafter Stop heparin drip with first dose of Eliquis Pharmacy to provide Eliquis education/coupons prior to discharge  Bernadene Person, PharmD, BCPS [phone] 12/15/2023, 8:39 AM

## 2023-12-15 NOTE — Progress Notes (Signed)
ANTICOAGULATION CONSULT NOTE  Pharmacy Consult for Heparin Indication: pulmonary embolus Brief A/P: Heparin level subtherapeutic Increase Heparin rate  Allergies  Allergen Reactions   Novocain [Procaine] Anaphylaxis   Povidone Iodine Rash    Rash    Patient Measurements: Height: 5' (152.4 cm) Weight: 87.5 kg (192 lb 14.4 oz) IBW/kg (Calculated) : 45.5 Heparin Dosing Weight: 65.3 kg  Vital Signs: Temp: 97.8 F (36.6 C) (12/27 2300) Temp Source: Axillary (12/27 2300) BP: 108/49 (12/27 2200) Pulse Rate: 87 (12/27 2200)  Labs: Recent Labs    12/13/23 1741 12/13/23 1932 12/14/23 0253 12/14/23 1218 12/14/23 2052 12/15/23 0312  HGB 10.5*  --   --  10.4*  --  10.5*  HCT 33.0*  --   --  32.5*  --  34.6*  PLT 459*  --   --  476*  --  440*  HEPARINUNFRC  --   --    < > 0.17* 0.38 0.43  CREATININE 0.67  --   --  0.54  --  0.60  TROPONINIHS 3 3  --   --   --   --    < > = values in this interval not displayed.    Estimated Creatinine Clearance: 91 mL/min (by C-G formula based on SCr of 0.6 mg/dL).  Assessment: 42 yo female s/p recent hernia repair, admitted with new PE and possible SBO. Pharmacy to dose IV heparin until need for invasive procedure ruled out.  Baseline INR, aPTT: not done Prior anticoagulation: none  Significant events:  Today, 12/15/2023:  HL 0.43 therapeutic on 1600 units/hr Hgb 10.5, plts 440 No complications of therapy noted  Goal of Therapy: Heparin level 0.3-0.7 units/ml Monitor platelets by anticoagulation protocol: Yes  Plan: continue heparin drip at 1600 units/hr Daily CBC, daily heparin level once stable Monitor for signs of bleeding or thrombosis  Arley Phenix RPh 12/15/2023, 4:11 AM

## 2023-12-15 NOTE — Plan of Care (Signed)
  Problem: Clinical Measurements: Goal: Diagnostic test results will improve Outcome: Progressing Goal: Respiratory complications will improve Outcome: Progressing Goal: Cardiovascular complication will be avoided Outcome: Progressing   Problem: Activity: Goal: Risk for activity intolerance will decrease Outcome: Progressing   Problem: Nutrition: Goal: Adequate nutrition will be maintained Outcome: Progressing   Problem: Coping: Goal: Level of anxiety will decrease Outcome: Progressing   Problem: Elimination: Goal: Will not experience complications related to urinary retention Outcome: Progressing   Problem: Pain Management: Goal: General experience of comfort will improve Outcome: Progressing   Problem: Safety: Goal: Ability to remain free from injury will improve Outcome: Progressing   Problem: Skin Integrity: Goal: Risk for impaired skin integrity will decrease Outcome: Progressing

## 2023-12-16 DIAGNOSIS — K567 Ileus, unspecified: Secondary | ICD-10-CM | POA: Diagnosis not present

## 2023-12-16 DIAGNOSIS — K9189 Other postprocedural complications and disorders of digestive system: Secondary | ICD-10-CM

## 2023-12-16 DIAGNOSIS — I2609 Other pulmonary embolism with acute cor pulmonale: Secondary | ICD-10-CM | POA: Diagnosis not present

## 2023-12-16 DIAGNOSIS — N390 Urinary tract infection, site not specified: Secondary | ICD-10-CM | POA: Diagnosis not present

## 2023-12-16 LAB — COMPREHENSIVE METABOLIC PANEL
ALT: 14 U/L (ref 0–44)
AST: 23 U/L (ref 15–41)
Albumin: 3 g/dL — ABNORMAL LOW (ref 3.5–5.0)
Alkaline Phosphatase: 98 U/L (ref 38–126)
Anion gap: 9 (ref 5–15)
BUN: 7 mg/dL (ref 6–20)
CO2: 25 mmol/L (ref 22–32)
Calcium: 9 mg/dL (ref 8.9–10.3)
Chloride: 102 mmol/L (ref 98–111)
Creatinine, Ser: 0.63 mg/dL (ref 0.44–1.00)
GFR, Estimated: >60 mL/min (ref >60)
Glucose, Bld: 89 mg/dL (ref 70–99)
Potassium: 4.2 mmol/L (ref 3.5–5.1)
Sodium: 136 mmol/L (ref 135–145)
Total Bilirubin: 0.2 mg/dL (ref <1.2)
Total Protein: 6.5 g/dL (ref 6.5–8.1)

## 2023-12-16 LAB — CBC
HCT: 33.9 % — ABNORMAL LOW (ref 36.0–46.0)
Hemoglobin: 10.6 g/dL — ABNORMAL LOW (ref 12.0–15.0)
MCH: 29.4 pg (ref 26.0–34.0)
MCHC: 31.3 g/dL (ref 30.0–36.0)
MCV: 94.2 fL (ref 80.0–100.0)
Platelets: 467 10*3/uL — ABNORMAL HIGH (ref 150–400)
RBC: 3.6 MIL/uL — ABNORMAL LOW (ref 3.87–5.11)
RDW: 13.3 % (ref 11.5–15.5)
WBC: 5.5 10*3/uL (ref 4.0–10.5)
nRBC: 0 % (ref 0.0–0.2)

## 2023-12-16 LAB — PHOSPHORUS: Phosphorus: 3.8 mg/dL (ref 2.5–4.6)

## 2023-12-16 LAB — URINE CULTURE: Culture: 70000 — AB

## 2023-12-16 LAB — MAGNESIUM: Magnesium: 2.1 mg/dL (ref 1.7–2.4)

## 2023-12-16 MED ORDER — OXYCODONE-ACETAMINOPHEN 5-325 MG PO TABS: 1.0000 | ORAL_TABLET | Freq: Three times a day (TID) | ORAL | 0 refills | Status: DC | PRN

## 2023-12-16 MED ORDER — SODIUM CHLORIDE 0.9 % IV SOLN
1.0000 g | Freq: Once | INTRAVENOUS | Status: AC
  Administered 2023-12-16: 1 g via INTRAVENOUS
  Filled 2023-12-16: qty 10

## 2023-12-16 MED ORDER — APIXABAN 5 MG PO TABS: 5.0000 mg | ORAL_TABLET | Freq: Two times a day (BID) | ORAL | 2 refills | Status: DC

## 2023-12-16 MED ORDER — APIXABAN 5 MG PO TABS: ORAL_TABLET | ORAL | 0 refills | Status: DC

## 2023-12-16 NOTE — Discharge Summary (Signed)
Physician Discharge Summary  Jessica Banks ZOX:[id number] DOB: March 22, 1981 DOA: 12/13/2023  PCP: Dorothyann Peng, MD  Admit date: 12/13/2023 Discharge date: 12/16/2023  Admitted From: Home Disposition: Home  Recommendations for Outpatient Follow-up:  Follow up with PCP in 2 to 4 weeks Surgery to schedule follow-up.  Home Health: N/A Equipment/Devices: N/A  Discharge Condition: Stable CODE STATUS: Full code Diet recommendation: Regular diet, soft food.  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  Had some calf discomfort but without any chest pain or shortness of breath. In the emergency room urine was abnormal.  CT scan abdomen pelvis consistent with ileus versus postop finding.  CT angiogram of the chest showed multiple small pulmonary embolism on the right upper pulmonary artery.  Admitted with surgical consultation.  Acute pulmonary embolism: Patient does have postoperative pulmonary embolism.  Duplex is negative for DVT.  No right heart strain.  Provoked PE after surgery, recommend 3 to 6 months of anticoagulation.  Patient made informed decision to start Eliquis.  Started on loading dose and subsequent maintenance dose.  Prescribed for 3 months.   Abdominal pain/ileus: Likely abdominal discomfort, incisional pain and postop ileus.  Clinically improved.  Adequate bowel function.  She will use stool softener.  Small frequent meals.  Prescribed a short course of pain medication for postop pain.  Suspected UTI: She has already been treated with nitrofurantoin. Urine cultures negative so far.  Day 3 of Rocephin today.  Will discontinue further antibiotics.  Still has some hesitancy but symptomatically improving.    Continue all  long-term home medications. Discharge home today.    Discharge Diagnoses:  Principal Problem:   Acute pulmonary embolism (HCC) Active Problems:   Migraine   Hyponatremia   Mild protein malnutrition (HCC)   Normocytic anemia   Hyperglycemia   Thrombocytosis   Acute lower UTI (urinary tract infection)    Discharge Instructions  Discharge Instructions     Diet - low sodium heart healthy   Complete by: As directed    Increase activity slowly   Complete by: As directed       Allergies as of 12/16/2023       Reactions   Novocain [procaine] Anaphylaxis   Nsaids Other (See Comments)   H/o gastric bypass = avoid NSAIDs   Povidone Iodine Rash   Rash        Medication List     STOP taking these medications    nitrofurantoin (macrocrystal-monohydrate) 100 MG capsule Commonly known as: MACROBID   oxyCODONE 5 MG immediate release tablet Commonly known as: Oxy IR/ROXICODONE   tamsulosin 0.4 MG Caps capsule Commonly known as: FLOMAX   traMADol 50 MG tablet Commonly known as: ULTRAM       TAKE these medications    acetaminophen 500 MG tablet Commonly known as: TYLENOL Take 2 tablets (1,000 mg total) by mouth every 6 (six) hours as needed.   amphetamine-dextroamphetamine 30 MG 24 hr capsule Commonly known as: ADDERALL XR Take 30 mg by mouth daily.   apixaban 5 MG Tabs tablet Commonly known as: ELIQUIS Take 2 tablets (10 mg total) by mouth 2 (two) times daily for 6 days, THEN 1 tablet (5 mg total) 2 (two) times daily. Start taking on: December 16, 2023   apixaban 5 MG Tabs tablet Commonly known as:  ELIQUIS Take 1 tablet (5 mg total) by mouth 2 (two) times daily. Start taking on: January 21, 2024   butalbital-acetaminophen-caffeine 50-325-40 MG tablet Commonly known as: FIORICET Take 1 tablet by mouth 2 (two) times daily as needed. What changed:  how much to take when to take this reasons to take this   cyclobenzaprine 10 MG tablet Commonly known  as: FLEXERIL Take 1 tablet (10 mg total) by mouth daily as needed. What changed: reasons to take this   DULoxetine 60 MG capsule Commonly known as: Cymbalta Take 1 capsule (60 mg total) by mouth daily. What changed: when to take this   Emgality 120 MG/ML Soaj Generic drug: Galcanezumab-gnlm Inject 1 Pen into the skin every 30 (thirty) days.   FORTIFY PROBIOTIC WOMENS PO Take 1 capsule by mouth at bedtime.   IRON PO Take 1 tablet by mouth at bedtime.   Melatonin 10 MG Caps Take 10 mg by mouth at bedtime.   multivitamin tablet Take 1 tablet by mouth at bedtime.   Nurtec 75 MG Tbdp Generic drug: Rimegepant Sulfate Take 1 tablet (75 mg total) by mouth daily as needed.   oxyCODONE-acetaminophen 5-325 MG tablet Commonly known as: PERCOCET/ROXICET Take 1 tablet by mouth every 8 (eight) hours as needed for severe pain (pain score 7-10) or moderate pain (pain score 4-6).   temazepam 15 MG capsule Commonly known as: RESTORIL TAKE 1 CAPSULE BY MOUTH AT BEDTIME AS NEEDED FOR SLEEP What changed: See the new instructions.   VITAMIN D PO Take 1 capsule by mouth at bedtime.        Allergies  Allergen Reactions   Novocain [Procaine] Anaphylaxis   Nsaids Other (See Comments)    H/o gastric bypass = avoid NSAIDs   Povidone Iodine Rash    Rash    Consultations: General Surgery   Procedures/Studies: VAS [place] LOWER EXTREMITY VENOUS (DVT) Result Date: 12/14/2023  Lower Venous DVT Study Patient Name:  Jessica Banks  Date of Exam:   12/14/2023 Medical Rec #: [id number]            Accession #:    [id number] Date of Birth: 1981-02-26            Patient Gender: F Patient Age:   42 years Exam Location:  [facility] Procedure:      VAS [place] LOWER EXTREMITY VENOUS (DVT) Referring Phys: DAVID ORTIZ --------------------------------------------------------------------------------  Indications: Pulmonary embolism.  Risk Factors: Confirmed PE. Anticoagulation: Heparin. Limitations:  Poor ultrasound/tissue interface. Comparison Study: No prior studies. Performing Technologist: Chanda Busing RVT  Examination Guidelines: A complete evaluation includes B-mode imaging, spectral Doppler, color Doppler, and power Doppler as needed of all accessible portions of each vessel. Bilateral testing is considered an integral part of a complete examination. Limited examinations for reoccurring indications may be performed as noted. The reflux portion of the exam is performed with the patient in reverse Trendelenburg.  +---------+---------------+---------+-----------+----------+--------------+ RIGHT    CompressibilityPhasicitySpontaneityPropertiesThrombus Aging +---------+---------------+---------+-----------+----------+--------------+ CFV      Full           Yes      Yes                                 +---------+---------------+---------+-----------+----------+--------------+ SFJ      Full                                                        +---------+---------------+---------+-----------+----------+--------------+  FV Prox  Full                                                        +---------+---------------+---------+-----------+----------+--------------+ FV Mid   Full                                                        +---------+---------------+---------+-----------+----------+--------------+ FV DistalFull                                                        +---------+---------------+---------+-----------+----------+--------------+ PFV      Full                                                        +---------+---------------+---------+-----------+----------+--------------+ POP      Full           Yes      Yes                                 +---------+---------------+---------+-----------+----------+--------------+ PTV      Full                                                         +---------+---------------+---------+-----------+----------+--------------+ PERO     Full                                                        +---------+---------------+---------+-----------+----------+--------------+   +---------+---------------+---------+-----------+----------+--------------+ LEFT     CompressibilityPhasicitySpontaneityPropertiesThrombus Aging +---------+---------------+---------+-----------+----------+--------------+ CFV      Full           Yes      Yes                                 +---------+---------------+---------+-----------+----------+--------------+ SFJ      Full                                                        +---------+---------------+---------+-----------+----------+--------------+ FV Prox  Full                                                        +---------+---------------+---------+-----------+----------+--------------+  FV Mid   Full                                                        +---------+---------------+---------+-----------+----------+--------------+ FV DistalFull           Yes      Yes                                 +---------+---------------+---------+-----------+----------+--------------+ PFV      Full                                                        +---------+---------------+---------+-----------+----------+--------------+ POP      Full           Yes      Yes                                 +---------+---------------+---------+-----------+----------+--------------+ PTV      Full                                                        +---------+---------------+---------+-----------+----------+--------------+ PERO     Full                                                        +---------+---------------+---------+-----------+----------+--------------+     Summary: RIGHT: - There is no evidence of deep vein thrombosis in the lower extremity.  - No cystic structure found in  the popliteal fossa.  LEFT: - There is no evidence of deep vein thrombosis in the lower extremity.  - No cystic structure found in the popliteal fossa.  *See table(s) above for measurements and observations. Electronically signed by Heath Lark on 12/14/2023 at 9:21:06 PM.    Final    ECHOCARDIOGRAM COMPLETE Result Date: 12/14/2023    ECHOCARDIOGRAM REPORT   Patient Name:   Jessica Banks Date of Exam: 12/14/2023 Medical Rec #:  [id number]           Height:       60.0 in Accession #:    [id number]          Weight:       187.0 lb Date of Birth:  17-Sep-1981           BSA:          1.814 m Patient Age:    42 years            BP:           107/48 mmHg Patient Gender: F                   HR:           82  bpm. Exam Location:  Inpatient Procedure: 2D Echo, Cardiac Doppler and Color Doppler Indications:    I26.02 Pulmonary embolus  History:        Patient has prior history of Echocardiogram examinations, most                 recent 07/27/2017. Signs/Symptoms:Syncope and Edema; Risk                 Factors:Former Smoker.  Sonographer:    Sheralyn Boatman RDCS Referring Phys: [id number] DAVID MANUEL ORTIZ  Sonographer Comments: Patient had recent abdominal surgery. IMPRESSIONS  1. Left ventricular ejection fraction, by estimation, is 55 to 60%. The left ventricle has normal function. The left ventricle has no regional wall motion abnormalities. Left ventricular diastolic parameters were normal.  2. Right ventricular systolic function is normal. The right ventricular size is normal.  3. The mitral valve is normal in structure. Trivial mitral valve regurgitation. No evidence of mitral stenosis.  4. The aortic valve is tricuspid. Aortic valve regurgitation is not visualized. No aortic stenosis is present.  5. The inferior vena cava is normal in size with greater than 50% respiratory variability, suggesting right atrial pressure of 3 mmHg. Comparison(s): No prior Echocardiogram. Conclusion(s)/Recommendation(s): No evidence of  right heart strain. FINDINGS  Left Ventricle: Left ventricular ejection fraction, by estimation, is 55 to 60%. The left ventricle has normal function. The left ventricle has no regional wall motion abnormalities. The left ventricular internal cavity size was normal in size. There is  no left ventricular hypertrophy. Left ventricular diastolic parameters were normal. Right Ventricle: The right ventricular size is normal. Right ventricular systolic function is normal. Left Atrium: Left atrial size was normal in size. Right Atrium: Right atrial size was normal in size. Pericardium: There is no evidence of pericardial effusion. Mitral Valve: The mitral valve is normal in structure. Trivial mitral valve regurgitation. No evidence of mitral valve stenosis. Tricuspid Valve: The tricuspid valve is normal in structure. Tricuspid valve regurgitation is trivial. No evidence of tricuspid stenosis. Aortic Valve: The aortic valve is tricuspid. Aortic valve regurgitation is not visualized. No aortic stenosis is present. Pulmonic Valve: The pulmonic valve was normal in structure. Pulmonic valve regurgitation is not visualized. No evidence of pulmonic stenosis. Aorta: The aortic root and ascending aorta are structurally normal, with no evidence of dilitation. Venous: The inferior vena cava is normal in size with greater than 50% respiratory variability, suggesting right atrial pressure of 3 mmHg. IAS/Shunts: No atrial level shunt detected by color flow Doppler.  LEFT VENTRICLE PLAX 2D LVIDd:         4.10 cm      Diastology LVIDs:         2.80 cm      LV e' medial:    10.40 cm/s LV PW:         1.00 cm      LV E/e' medial:  8.0 LV IVS:        1.10 cm      LV e' lateral:   15.10 cm/s LVOT diam:     2.20 cm      LV E/e' lateral: 5.5 LV SV:         91 LV SV Index:   50 LVOT Area:     3.80 cm  LV Volumes (MOD) LV vol d, MOD A2C: 108.0 ml LV vol d, MOD A4C: 95.6 ml LV vol s, MOD A2C: 44.1 ml LV vol s, MOD A4C: 38.7 ml LV SV  MOD A2C:      63.9 ml LV SV MOD A4C:     95.6 ml LV SV MOD BP:      60.5 ml RIGHT VENTRICLE             IVC RV S prime:     14.10 cm/s  IVC diam: 1.50 cm TAPSE (M-mode): 2.3 cm LEFT ATRIUM             Index        RIGHT ATRIUM          Index LA diam:        4.20 cm 2.32 cm/m   RA Area:     8.25 cm LA Vol (A2C):   30.2 ml 16.65 ml/m  RA Volume:   14.60 ml 8.05 ml/m LA Vol (A4C):   41.5 ml 22.88 ml/m LA Biplane Vol: 35.7 ml 19.68 ml/m  AORTIC VALVE LVOT Vmax:   130.00 cm/s LVOT Vmean:  85.700 cm/s LVOT VTI:    0.240 m  AORTA Ao Root diam: 2.60 cm Ao Asc diam:  2.70 cm MITRAL VALVE MV Area (PHT): 5.02 cm    SHUNTS MV Decel Time: 151 msec    Systemic VTI:  0.24 m MV E velocity: 83.20 cm/s  Systemic Diam: 2.20 cm MV A velocity: 61.20 cm/s MV E/A ratio:  1.36 Olga Millers MD Electronically signed by Olga Millers MD Signature Date/Time: 12/14/2023/3:04:04 PM    Final    CT Angio Chest PE W and/or Wo Contrast Result Date: 12/13/2023 CLINICAL DATA:  Shortness of breath with left lower extremity pain and suprapubic pressure. EXAM: CT ANGIOGRAPHY CHEST WITH CONTRAST TECHNIQUE: Multidetector CT imaging of the chest was performed using the standard protocol during bolus administration of intravenous contrast. Multiplanar CT image reconstructions and MIPs were obtained to evaluate the vascular anatomy. RADIATION DOSE REDUCTION: This exam was performed according to the departmental dose-optimization program which includes automated exposure control, adjustment of the mA and/or kV according to patient size and/or use of iterative reconstruction technique. CONTRAST:  OMNIPAQUE IOHEXOL 350 MG/ML SOLN COMPARISON:  Apr 23, 2019 FINDINGS: Cardiovascular: The thoracic aorta is unremarkable. Satisfactory opacification of the pulmonary arteries to the segmental level. A mild amount of intraluminal low attenuation is seen involving multiple posterior upper lobe branches of the right pulmonary artery. Normal heart size. No right heart  strain is identified (RV/LV ratio of 0.98). No pericardial effusion. Mediastinum/Nodes: No enlarged mediastinal, hilar, or axillary lymph nodes. Thyroid gland, trachea, and esophagus demonstrate no significant findings. Lungs/Pleura: Mild linear scarring and/or atelectasis is seen within the bilateral lung bases, left greater than right. No pleural effusion or pneumothorax is identified. Upper Abdomen: There is a small hiatal hernia with numerous surgical sutures seen throughout the gastric region. Musculoskeletal: No chest wall abnormality. No acute or significant osseous findings. Review of the MIP images confirms the above findings. IMPRESSION: 1. Mild amount of pulmonary embolism involving multiple upper lobe branches of the right pulmonary artery. 2. No evidence of associated right heart strain. 3. Mild bibasilar linear scarring and/or atelectasis, left greater than right. 4. 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 Electronically Signed   By: Aram Candela M.D.   On: 12/13/2023 19:39   CT ABDOMEN PELVIS W CONTRAST Result Date: 12/13/2023 CLINICAL DATA:  Suprapubic pressure and left lower back pain. EXAM: CT ABDOMEN AND PELVIS WITH CONTRAST TECHNIQUE: Multidetector CT imaging of the abdomen and pelvis was performed using the standard protocol following bolus administration  of intravenous contrast. RADIATION DOSE REDUCTION: This exam was performed according to the departmental dose-optimization program which includes automated exposure control, adjustment of the mA and/or kV according to patient size and/or use of iterative reconstruction technique. CONTRAST:  OMNIPAQUE IOHEXOL 350 MG/ML SOLN COMPARISON:  December 05, 2023 FINDINGS: Lower chest: Mild atelectasis is seen within the bilateral lung bases. Hepatobiliary: No focal liver abnormality is seen. Status post cholecystectomy. No biliary dilatation. Pancreas: Unremarkable. No pancreatic ductal dilatation or surrounding  inflammatory changes. Spleen: Normal in size without focal abnormality. Adrenals/Urinary Tract: Adrenal glands are unremarkable. Kidneys are normal, without renal calculi, focal lesion, or hydronephrosis. The urinary bladder is poorly distended and subsequently limited in evaluation. Stomach/Bowel: There is a small hiatal hernia. Surgical sutures are seen throughout the gastric region with surgically anastomosed bowel also noted within the anterior aspect of the mid abdomen. Appendix appears normal. Multiple dilated loops of air and fluid filled jejunum are seen (maximum small bowel diameter of approximately 4.1 cm). A clear transition zone is not identified. Vascular/Lymphatic: No significant vascular findings are present. No enlarged abdominal or pelvic lymph nodes. Reproductive: The uterus is heterogeneous in appearance. A 2.1 cm diameter tortuous fluid-filled tubular appearing structure is seen along the posterior aspect of the left adnexa. This represents a new finding when compared to the prior exam. Other: No abdominal wall hernia or abnormality. No abdominopelvic ascites. Musculoskeletal: No acute or significant osseous findings. IMPRESSION: 1. Findings consistent with a partial small bowel obstruction versus ileus. 2. Findings which may represent a left-sided hydrosalpinx. Correlation with pelvic ultrasound is recommended. 3. Evidence of prior cholecystectomy. 4. Small hiatal hernia. Electronically Signed   By: Aram Candela M.D.   On: 12/13/2023 19:32   CT ABDOMEN PELVIS W CONTRAST Result Date: 12/05/2023 CLINICAL DATA:  Postoperative abdominal pain EXAM: CT ABDOMEN AND PELVIS WITH CONTRAST TECHNIQUE: Multidetector CT imaging of the abdomen and pelvis was performed using the standard protocol following bolus administration of intravenous contrast. RADIATION DOSE REDUCTION: This exam was performed according to the departmental dose-optimization program which includes automated exposure control,  adjustment of the mA and/or kV according to patient size and/or use of iterative reconstruction technique. CONTRAST:  75mL OMNIPAQUE IOHEXOL 300 MG/ML  SOLN COMPARISON:  Earlier today FINDINGS: Lower chest:  Small hiatal hernia. Hepatobiliary: No significant liver finding.Tiny cysts are likely present in the upper central liver. Full gallbladder, no calcified stone or biliary dilatation. 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 9 mm cyst at the pancreatic tail, not clearly seen on 2020 chest CT comparison. No pancreatic ductal dilatation. Follow-up recommendations provided on preceding scan. Spleen: Unremarkable. Adrenals/Urinary Tract: Negative adrenals. No hydronephrosis or stone. Limited assessment of the bladder due to internal contrast with streak artifact. Stomach/Bowel: Redemonstrated cluster of small bowel loops in the left abdomen which have edematous mesentery. There is some wasting of mesenteric vessels marked on 2:35, these loops could be encapsulated by an internal hernia. No bowel wall thickening, obstruction, or mesenteric vessel compromise. There has been prior gastric bypass. Vascular/Lymphatic: No acute vascular abnormality. Left IVC. No mass or adenopathy. Reproductive:No pathologic findings.  Corpus luteum on the right. Other: Small volume ascites in the left upper quadrant, likely reactive. Musculoskeletal: No acute abnormalities. IMPRESSION: 1. Mesenteric edema in the left upper quadrant affecting a cluster of small bowel loops, possibly related to an internal hernia or diffusion, but no luminal obstruction, mesenteric vessel compromise, or bowel thickening. Trace reactive ascites in the left upper quadrant. 2. Pancreatic cyst as described  on the immediate preceding abdominal CT. Electronically Signed   By: Tiburcio Pea M.D.   On: 12/05/2023 05:28   CT ABDOMEN PELVIS W CONTRAST Result Date: 12/05/2023 CLINICAL DATA:  Upper abdominal pain.  History of bariatric surgery. EXAM: CT ABDOMEN  AND PELVIS WITH CONTRAST TECHNIQUE: Multidetector CT imaging of the abdomen and pelvis was performed using the standard protocol following bolus administration of intravenous contrast. RADIATION DOSE REDUCTION: This exam was performed according to the departmental dose-optimization program which includes automated exposure control, adjustment of the mA and/or kV according to patient size and/or use of iterative reconstruction technique. CONTRAST:  OMNIPAQUE IOHEXOL 300 MG/ML  SOLN COMPARISON:  CT abdomen and pelvis 04/10/2016 FINDINGS: Lower chest: No acute abnormality. Hepatobiliary: Unremarkable liver. Normal gallbladder. No biliary dilation. Pancreas: 9 mm cystic lesion or lesions in the pancreatic tail is new since 2017. no ductal dilation or peripancreatic inflammation. Spleen: Unremarkable. Adrenals/Urinary Tract: Normal adrenal glands. No urinary calculi or hydronephrosis. Bladder is unremarkable. Stomach/Bowel: Postoperative change gastric bypass. Normal caliber large and small bowel. Interloop edema within the jejunal mesentery in the left upper quadrant. Evaluation is mildly compromised by paucity of intra-abdominal fat and lack of oral contrast. No definite bowel wall thickening. The appendix is normal. 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 No enlarged abdominal or pelvic lymph nodes. Reproductive: Physiologic appearance of the uterus and ovaries. Other: Pre interloop fluid in the small bowel mesentery in the left upper quadrant and in the pelvis. No free intraperitoneal air. Musculoskeletal: No acute fracture. IMPRESSION: 1. Interloop edema within the jejunal mesentery in the left upper quadrant and in the pelvis. Evaluation is mildly compromised by paucity of intra-abdominal fat and lack of oral contrast. Findings are nonspecific but can be seen in the setting of enteritis. If further evaluation is desired, repeat CT with oral and IV contrast could be  considered. 2. 9 mm cystic lesion or lesions in the pancreatic tail is new since 2017. Recommend contrast-enhanced MRI or pancreas protocol CT in 1 year. Electronically Signed   By: Minerva Fester M.D.   On: 12/05/2023 01:38   (Echo, Carotid, EGD, Colonoscopy, ERCP)    Subjective: Patient seen and examined.  Occasional abdominal distention but relieved after passing flatus.  Urine flow has improved with still some episodes of discomfort at the end.  Without fever or chills.  Without nausea vomiting.  Eager to go home.   Discharge Exam: Vitals:   12/15/23 2137 12/16/23 0550  BP: (!) 109/53 119/71  Pulse: 86 72  Resp: 17 16  Temp: 98.6 F (37 C) 98.2 F (36.8 C)  SpO2: 98% 97%   Vitals:   12/15/23 1200 12/15/23 1315 12/15/23 2137 12/16/23 0550  BP:  122/85 (!) 109/53 119/71  Pulse:  79 86 72  Resp:  17 17 16   Temp: 98.2 F (36.8 C) 98.3 F (36.8 C) 98.6 F (37 C) 98.2 F (36.8 C)  TempSrc: Oral Oral Oral Oral  SpO2:  98% 98% 97%  Weight:      Height:        General: Pt is alert, awake, not in acute distress Cardiovascular: RRR, S1/S2 +, no rubs, no gallops Respiratory: CTA bilaterally, no wheezing, no rhonchi Abdominal: Soft, NT, mildly distended.  Bowel sounds present.  Surgical incisions and ports are clean and dry. Extremities: no edema, no cyanosis    The results of significant diagnostics from this hospitalization (including imaging, microbiology, ancillary and laboratory) are listed below for reference.  Microbiology: No results found for this or any previous visit (from the past 240 hours).   Labs: BNP (last 3 results) No results for input(s): "BNP" in the last 8760 hours. Basic Metabolic Panel: Recent Labs  Lab 12/13/23 1741 12/14/23 1218 12/15/23 0312 12/16/23 0339  NA 139 132* 136 136  K 3.8 4.2 4.2 4.2  CL 103 100 102 102  CO2 28 24 25 25   GLUCOSE 137* 94 89 89  BUN 12 7 6 7   CREATININE 0.67 0.54 0.60 0.63  CALCIUM 8.9 8.8* 9.0 9.0  MG   --  2.3  --  2.1  PHOS  --  3.7  --  3.8   Liver Function Tests: Recent Labs  Lab 12/14/23 1218 12/15/23 0312 12/16/23 0339  AST 21 24 23   ALT 12 15 14   ALKPHOS 106 96 98  BILITOT 0.3 0.3 0.2  PROT 7.0 6.6 6.5  ALBUMIN 3.0* 2.8* 3.0*   No results for input(s): "LIPASE", "AMYLASE" in the last 168 hours. No results for input(s): "AMMONIA" in the last 168 hours. CBC: Recent Labs  Lab 12/13/23 1741 12/14/23 1218 12/15/23 0312 12/16/23 0339  WBC 7.0 6.7 6.7 5.5  HGB 10.5* 10.4* 10.5* 10.6*  HCT 33.0* 32.5* 34.6* 33.9*  MCV 90.9 93.1 94.8 94.2  PLT 459* 476* 440* 467*   Cardiac Enzymes: No results for input(s): "CKTOTAL", "CKMB", "CKMBINDEX", "TROPONINI" in the last 168 hours. BNP: Invalid input(s): "POCBNP" CBG: No results for input(s): "GLUCAP" in the last 168 hours. D-Dimer No results for input(s): "DDIMER" in the last 72 hours. Hgb A1c No results for input(s): "HGBA1C" in the last 72 hours. Lipid Profile No results for input(s): "CHOL", "HDL", "LDLCALC", "TRIG", "CHOLHDL", "LDLDIRECT" in the last 72 hours. Thyroid function studies No results for input(s): "TSH", "T4TOTAL", "T3FREE", "THYROIDAB" in the last 72 hours.  Invalid input(s): "FREET3" Anemia work up No results for input(s): "VITAMINB12", "FOLATE", "FERRITIN", "TIBC", "IRON", "RETICCTPCT" in the last 72 hours. Urinalysis    Component Value Date/Time   COLORURINE YELLOW 12/14/2023 1307   APPEARANCEUR HAZY (A) 12/14/2023 1307   LABSPEC 1.016 12/14/2023 1307   PHURINE 7.0 12/14/2023 1307   GLUCOSEU NEGATIVE 12/14/2023 1307   HGBUR SMALL (A) 12/14/2023 1307   BILIRUBINUR NEGATIVE 12/14/2023 1307   BILIRUBINUR small (A) 12/10/2023 1528   BILIRUBINUR Negative 03/01/2020 1100   KETONESUR NEGATIVE 12/14/2023 1307   PROTEINUR NEGATIVE 12/14/2023 1307   UROBILINOGEN 1.0 12/10/2023 1528   NITRITE NEGATIVE 12/14/2023 1307   LEUKOCYTESUR LARGE (A) 12/14/2023 1307   Sepsis Labs Recent Labs  Lab  12/13/23 1741 12/14/23 1218 12/15/23 0312 12/16/23 0339  WBC 7.0 6.7 6.7 5.5   Microbiology No results found for this or any previous visit (from the past 240 hours).   Time coordinating discharge: 35 minutes  SIGNED:   Dorcas Carrow, MD  [facility] 12/16/2023, 10:50 AM

## 2023-12-16 NOTE — Discharge Instructions (Signed)
Information on my medicine - ELIQUIS (apixaban)  This medication education was reviewed with me or my healthcare representative as part of my discharge preparation.  The pharmacist that spoke with me during my hospital stay was:  Catlynn Grondahl A, RPH  Why was Eliquis prescribed for you? Eliquis was prescribed to treat blood clots that may have been found in the veins of your legs (deep vein thrombosis) or in your lungs (pulmonary embolism) and to reduce the risk of them occurring again.  What do You need to know about Eliquis ? The starting dose is 10 mg (two 5 mg tablets) taken TWICE daily for the FIRST SEVEN (7) DAYS, then on  12/22/2023  the dose is reduced to ONE 5 mg tablet taken TWICE daily.  Eliquis may be taken with or without food.   Try to take the dose about the same time in the morning and in the evening. If you have difficulty swallowing the tablet whole please discuss with your pharmacist how to take the medication safely.  Take Eliquis exactly as prescribed and DO NOT stop taking Eliquis without talking to the doctor who prescribed the medication.  Stopping may increase your risk of developing a new blood clot.  Refill your prescription before you run out.  After discharge, you should have regular check-up appointments with your healthcare provider that is prescribing your Eliquis.    What do you do if you miss a dose? If a dose of ELIQUIS is not taken at the scheduled time, take it as soon as possible on the same day and twice-daily administration should be resumed. The dose should not be doubled to make up for a missed dose.  Important Safety Information A possible side effect of Eliquis is bleeding. You should call your healthcare provider right away if you experience any of the following: Bleeding from an injury or your nose that does not stop. Unusual colored urine (red or dark brown) or unusual colored stools (red or black). Unusual bruising for unknown  reasons. A serious fall or if you hit your head (even if there is no bleeding).  Some medicines may interact with Eliquis and might increase your risk of bleeding or clotting while on Eliquis. To help avoid this, consult your healthcare provider or pharmacist prior to using any new prescription or non-prescription medications, including herbals, vitamins, non-steroidal anti-inflammatory drugs (NSAIDs) and supplements.  This website has more information on Eliquis (apixaban): http://www.eliquis.com/eliquis/home

## 2023-12-16 NOTE — Progress Notes (Signed)
[facility] Progress Note     Subjective: CC:  Patient doing better. Pain much improved.  Had an episode yesterday of significant bloating and discomfort that was relieved after passing a lot of flatus.  Tolerating soft diet.  There were some issues with specific foods not sitting particularly well.    Objective: Vital signs in last 24 hours: Temp:  [98.1 F (36.7 C)-98.6 F (37 C)] 98.2 F (36.8 C) (12/29 0550) Pulse Rate:  [67-86] 72 (12/29 0550) Resp:  [16-22] 16 (12/29 0550) BP: (90-122)/(53-85) 119/71 (12/29 0550) SpO2:  [97 %-100 %] 97 % (12/29 0550) Last BM Date : 12/15/23  Intake/Output from previous day: 12/28 0701 - 12/29 0700 In: 1401.4 [P.O.:1260; I.V.:41.4; IV Piggyback:100] Out: 325 [Urine:325] Intake/Output this shift: No intake/output data recorded.  PE: Gen:  Alert, NAD, pleasant Pulm:  Normal effort ORA Abd: Soft, non-tender, mild distention but improved since yesterday, no guarding, no rebound tenderness Psych: A&Ox3   Lab Results:  Recent Labs    12/15/23 0312 12/16/23 0339  WBC 6.7 5.5  HGB 10.5* 10.6*  HCT 34.6* 33.9*  PLT 440* 467*   BMET Recent Labs    12/15/23 0312 12/16/23 0339  NA 136 136  K 4.2 4.2  CL 102 102  CO2 25 25  GLUCOSE 89 89  BUN 6 7  CREATININE 0.60 0.63  CALCIUM 9.0 9.0   PT/INR No results for input(s): "LABPROT", "INR" in the last 72 hours. CMP     Component Value Date/Time   NA 136 12/16/2023 0339   NA 138 09/23/2023 0000   K 4.2 12/16/2023 0339   CL 102 12/16/2023 0339   CO2 25 12/16/2023 0339   GLUCOSE 89 12/16/2023 0339   BUN 7 12/16/2023 0339   BUN 9 09/23/2023 0000   CREATININE 0.63 12/16/2023 0339   CREATININE 0.60 04/06/2016 1700   CALCIUM 9.0 12/16/2023 0339   PROT 6.5 12/16/2023 0339   PROT 6.9 03/28/2023 1000   ALBUMIN 3.0 (L) 12/16/2023 0339   ALBUMIN 4.2 03/28/2023 1000   AST 23 12/16/2023 0339   ALT 14 12/16/2023 0339   ALKPHOS 98 12/16/2023 0339   BILITOT 0.2  12/16/2023 0339   BILITOT <0.2 03/28/2023 1000   GFRNONAA >60 12/16/2023 0339   GFRNONAA >89 04/06/2016 1700   GFRAA >60 04/23/2019 0105   GFRAA >89 04/06/2016 1700   Lipase     Component Value Date/Time   LIPASE 25 12/04/2023 2123     Assessment/Plan Post-operative ileus vs pSBO, favor ileus Acute pulmonary embolus 42 y/o F with hx RYGB 07/2022 at [facility] 2023 who was recently admitted here due to internal hernia. She is s/p  diagnostic laparoscopy, cholecystectomy, internal hernia repair 12/05/23 by Dr. Freida Busman.  She was discharged on POD#2.   She returned 12/27 with intermittent abdominal pain, urinary sxs, SOB, and calf discomfort. She was diagnosed with a PE and admitted to the hospital. CT scan showed ileus vs SBO.   Ileus appears to have resolved other than still some mild bloating.  This improves with flatus.   Left sided abdominal muscular pain improved  Soft diet as tolerated.   On eliquis for PE.    OK for home from surgical standpoint.    LOS: 2 days   I reviewed nursing notes, ED provider notes, hospitalist notes, last 24 h vitals and pain scores, last 48 h intake and output, last 24 h labs and trends, and last 24 h imaging results.   Kawanna Christley L  Donell Beers, MD, FACS, FSSO Surgical Oncology, General Surgery, Trauma and Critical [facility], [place] [phone] for weekday/non holidays Check [email or web address] for coverage night/weekend/holidays    Studies/Results: VAS [place] LOWER EXTREMITY VENOUS (DVT) Result Date: 12/14/2023  Lower Venous DVT Study Patient Name:  REOLA SCHUENKE  Date of Exam:   12/14/2023 Medical Rec #: [id number]            Accession #:    [id number] Date of Birth: 1981/11/30            Patient Gender: F Patient Age:   42 years Exam Location:  [facility] Procedure:      VAS [place] LOWER EXTREMITY VENOUS (DVT) Referring Phys: DAVID ORTIZ --------------------------------------------------------------------------------  Indications:  Pulmonary embolism.  Risk Factors: Confirmed PE. Anticoagulation: Heparin. Limitations: Poor ultrasound/tissue interface. Comparison Study: No prior studies. Performing Technologist: Chanda Busing RVT  Examination Guidelines: A complete evaluation includes B-mode imaging, spectral Doppler, color Doppler, and power Doppler as needed of all accessible portions of each vessel. Bilateral testing is considered an integral part of a complete examination. Limited examinations for reoccurring indications may be performed as noted. The reflux portion of the exam is performed with the patient in reverse Trendelenburg.  +---------+---------------+---------+-----------+----------+--------------+ RIGHT    CompressibilityPhasicitySpontaneityPropertiesThrombus Aging +---------+---------------+---------+-----------+----------+--------------+ CFV      Full           Yes      Yes                                 +---------+---------------+---------+-----------+----------+--------------+ SFJ      Full                                                        +---------+---------------+---------+-----------+----------+--------------+ FV Prox  Full                                                        +---------+---------------+---------+-----------+----------+--------------+ FV Mid   Full                                                        +---------+---------------+---------+-----------+----------+--------------+ FV DistalFull                                                        +---------+---------------+---------+-----------+----------+--------------+ PFV      Full                                                        +---------+---------------+---------+-----------+----------+--------------+ POP      Full           Yes  Yes                                 +---------+---------------+---------+-----------+----------+--------------+ PTV      Full                                                         +---------+---------------+---------+-----------+----------+--------------+ PERO     Full                                                        +---------+---------------+---------+-----------+----------+--------------+   +---------+---------------+---------+-----------+----------+--------------+ LEFT     CompressibilityPhasicitySpontaneityPropertiesThrombus Aging +---------+---------------+---------+-----------+----------+--------------+ CFV      Full           Yes      Yes                                 +---------+---------------+---------+-----------+----------+--------------+ SFJ      Full                                                        +---------+---------------+---------+-----------+----------+--------------+ FV Prox  Full                                                        +---------+---------------+---------+-----------+----------+--------------+ FV Mid   Full                                                        +---------+---------------+---------+-----------+----------+--------------+ FV DistalFull           Yes      Yes                                 +---------+---------------+---------+-----------+----------+--------------+ PFV      Full                                                        +---------+---------------+---------+-----------+----------+--------------+ POP      Full           Yes      Yes                                 +---------+---------------+---------+-----------+----------+--------------+ PTV      Full                                                        +---------+---------------+---------+-----------+----------+--------------+  PERO     Full                                                        +---------+---------------+---------+-----------+----------+--------------+     Summary: RIGHT: - There is no evidence of deep vein thrombosis in the lower  extremity.  - No cystic structure found in the popliteal fossa.  LEFT: - There is no evidence of deep vein thrombosis in the lower extremity.  - No cystic structure found in the popliteal fossa.  *See table(s) above for measurements and observations. Electronically signed by Heath Lark on 12/14/2023 at 9:21:06 PM.    Final    ECHOCARDIOGRAM COMPLETE Result Date: 12/14/2023    ECHOCARDIOGRAM REPORT   Patient Name:   Amil Amen Date of Exam: 12/14/2023 Medical Rec #:  [id number]           Height:       60.0 in Accession #:    [id number]          Weight:       187.0 lb Date of Birth:  Apr 20, 1981           BSA:          1.814 m Patient Age:    42 years            BP:           107/48 mmHg Patient Gender: F                   HR:           82 bpm. Exam Location:  Inpatient Procedure: 2D Echo, Cardiac Doppler and Color Doppler Indications:    I26.02 Pulmonary embolus  History:        Patient has prior history of Echocardiogram examinations, most                 recent 07/27/2017. Signs/Symptoms:Syncope and Edema; Risk                 Factors:Former Smoker.  Sonographer:    Sheralyn Boatman RDCS Referring Phys: [id number] DAVID MANUEL ORTIZ  Sonographer Comments: Patient had recent abdominal surgery. IMPRESSIONS  1. Left ventricular ejection fraction, by estimation, is 55 to 60%. The left ventricle has normal function. The left ventricle has no regional wall motion abnormalities. Left ventricular diastolic parameters were normal.  2. Right ventricular systolic function is normal. The right ventricular size is normal.  3. The mitral valve is normal in structure. Trivial mitral valve regurgitation. No evidence of mitral stenosis.  4. The aortic valve is tricuspid. Aortic valve regurgitation is not visualized. No aortic stenosis is present.  5. The inferior vena cava is normal in size with greater than 50% respiratory variability, suggesting right atrial pressure of 3 mmHg. Comparison(s): No prior Echocardiogram.  Conclusion(s)/Recommendation(s): No evidence of right heart strain. FINDINGS  Left Ventricle: Left ventricular ejection fraction, by estimation, is 55 to 60%. The left ventricle has normal function. The left ventricle has no regional wall motion abnormalities. The left ventricular internal cavity size was normal in size. There is  no left ventricular hypertrophy. Left ventricular diastolic parameters were normal. Right Ventricle: The right ventricular size is normal. Right ventricular systolic function is normal. Left Atrium: Left atrial size was normal in size. Right Atrium: Right  atrial size was normal in size. Pericardium: There is no evidence of pericardial effusion. Mitral Valve: The mitral valve is normal in structure. Trivial mitral valve regurgitation. No evidence of mitral valve stenosis. Tricuspid Valve: The tricuspid valve is normal in structure. Tricuspid valve regurgitation is trivial. No evidence of tricuspid stenosis. Aortic Valve: The aortic valve is tricuspid. Aortic valve regurgitation is not visualized. No aortic stenosis is present. Pulmonic Valve: The pulmonic valve was normal in structure. Pulmonic valve regurgitation is not visualized. No evidence of pulmonic stenosis. Aorta: The aortic root and ascending aorta are structurally normal, with no evidence of dilitation. Venous: The inferior vena cava is normal in size with greater than 50% respiratory variability, suggesting right atrial pressure of 3 mmHg. IAS/Shunts: No atrial level shunt detected by color flow Doppler.  LEFT VENTRICLE PLAX 2D LVIDd:         4.10 cm      Diastology LVIDs:         2.80 cm      LV e' medial:    10.40 cm/s LV PW:         1.00 cm      LV E/e' medial:  8.0 LV IVS:        1.10 cm      LV e' lateral:   15.10 cm/s LVOT diam:     2.20 cm      LV E/e' lateral: 5.5 LV SV:         91 LV SV Index:   50 LVOT Area:     3.80 cm  LV Volumes (MOD) LV vol d, MOD A2C: 108.0 ml LV vol d, MOD A4C: 95.6 ml LV vol s, MOD A2C: 44.1 ml  LV vol s, MOD A4C: 38.7 ml LV SV MOD A2C:     63.9 ml LV SV MOD A4C:     95.6 ml LV SV MOD BP:      60.5 ml RIGHT VENTRICLE             IVC RV S prime:     14.10 cm/s  IVC diam: 1.50 cm TAPSE (M-mode): 2.3 cm LEFT ATRIUM             Index        RIGHT ATRIUM          Index LA diam:        4.20 cm 2.32 cm/m   RA Area:     8.25 cm LA Vol (A2C):   30.2 ml 16.65 ml/m  RA Volume:   14.60 ml 8.05 ml/m LA Vol (A4C):   41.5 ml 22.88 ml/m LA Biplane Vol: 35.7 ml 19.68 ml/m  AORTIC VALVE LVOT Vmax:   130.00 cm/s LVOT Vmean:  85.700 cm/s LVOT VTI:    0.240 m  AORTA Ao Root diam: 2.60 cm Ao Asc diam:  2.70 cm MITRAL VALVE MV Area (PHT): 5.02 cm    SHUNTS MV Decel Time: 151 msec    Systemic VTI:  0.24 m MV E velocity: 83.20 cm/s  Systemic Diam: 2.20 cm MV A velocity: 61.20 cm/s MV E/A ratio:  1.36 Olga Millers MD Electronically signed by Olga Millers MD Signature Date/Time: 12/14/2023/3:04:04 PM    Final     Anti-infectives: Anti-infectives (From admission, onward)    Start     Dose/Rate Route Frequency Ordered Stop   12/14/23 1600  cefTRIAXone (ROCEPHIN) 1 g in sodium chloride 0.9 % 100 mL IVPB        1  g 200 mL/hr over 30 Minutes Intravenous Daily 12/14/23 1506

## 2023-12-16 NOTE — Progress Notes (Signed)
Discharge instructions given to patient and all questions were answered.  

## 2023-12-16 NOTE — TOC CM/SW Note (Signed)
Transition of Care [facility]) - Inpatient Brief Assessment   Patient Details  Name: LATOYAH MCPETERS MRN: [id number] Date of Birth: 1981-06-05  [facility] CM/SW Contact:    Darleene Cleaver, LCSW Phone Number: 12/16/2023, 9:47 AM   Clinical Narrative: Patient has PCP, insurance, and does not have any SDOH needs.  No anticipated TOC needs.   Transition of Care Asessment: Insurance and Status: Insurance coverage has been reviewed Patient has primary care physician: Yes Home environment has been reviewed: Yes Prior level of function:: Indep Prior/Current Home Services: No current home services Social Drivers of Health Review: SDOH reviewed no interventions necessary Readmission risk has been reviewed: Yes Transition of care needs: no transition of care needs at this time

## 2023-12-17 ENCOUNTER — Telehealth: Payer: Self-pay | Admitting: *Deleted

## 2023-12-17 NOTE — Transitions of Care (Post Inpatient/ED Visit) (Signed)
12/17/2023  Name: Jessica Banks MRN: [id number] DOB: 1981/02/20  Today's TOC FU Call Status: Today's TOC FU Call Status:: Successful TOC FU Call Completed TOC FU Call Complete Date: 12/17/23 Patient's Name and Date of Birth confirmed.  Transition Care Management Follow-up Telephone Call Date of Discharge: 12/16/23 Discharge Facility: [facility]) Type of Discharge: Inpatient Admission Primary Inpatient Discharge Diagnosis:: Acute Pulmonary embolism How have you been since you were released from the hospital?: Same Any questions or concerns?: Yes Patient Questions/Concerns:: patient had question on menses vs eliquis (Rn discussed time of when mestrual cycle normally starts and monitoring the length of time of flow normally runs) Patient Questions/Concerns Addressed: Other:  Items Reviewed: Did you receive and understand the discharge instructions provided?: Yes Medications obtained,verified, and reconciled?: Yes (Medications Reviewed) (RN discussed the Eliquis dosage and time to start) Any new allergies since your discharge?: No Dietary orders reviewed?: No Do you have support at home?: Yes People in Home: spouse, parent(s) Name of Support/Comfort Primary Source: Britt Bottom  Medications Reviewed Today: Medications Reviewed Today     Reviewed by Luella Cook, RN (Case Manager) on 12/17/23 at 1422  Med List Status: <None>   Medication Order Taking? Sig Documenting Provider Last Dose Status Informant  acetaminophen (TYLENOL) 500 MG tablet [id number] Yes Take 2 tablets (1,000 mg total) by mouth every 6 (six) hours as needed. Barnetta Chapel, PA-C Taking Active Self, Pharmacy Records  amphetamine-dextroamphetamine (ADDERALL XR) 30 MG 24 hr capsule [id number] Yes Take 30 mg by mouth daily. [provider] Taking Active Self, Pharmacy Records  apixaban (ELIQUIS) 5 MG TABS tablet [id number] Yes Take 2 tablets (10 mg total) by mouth 2 (two) times daily for 6 days, THEN 1  tablet (5 mg total) 2 (two) times daily. Dorcas Carrow, MD Taking Active   apixaban (ELIQUIS) 5 MG TABS tablet [id number] Yes Take 1 tablet (5 mg total) by mouth 2 (two) times daily. Dorcas Carrow, MD Taking Active   butalbital-acetaminophen-caffeine [facility]) [phone] MG tablet [id number] Yes Take 1 tablet by mouth 2 (two) times daily as needed.  Patient taking differently: Take 2 tablets by mouth daily as needed for headache or migraine.   Penumalli, Glenford Bayley, MD Taking Active Self, Pharmacy Records  cyclobenzaprine (FLEXERIL) 10 MG tablet [id number] Yes Take 1 tablet (10 mg total) by mouth daily as needed.  Patient taking differently: Take 10 mg by mouth daily as needed for muscle spasms.   Suanne Marker, MD Taking Active Self, Pharmacy Records           Med Note Jola Schmidt   Fri Dec 14, 2023 12:30 PM)    DULoxetine (CYMBALTA) 60 MG capsule [id number] Yes Take 1 capsule (60 mg total) by mouth daily.  Patient taking differently: Take 60 mg by mouth at bedtime.   Suanne Marker, MD Taking Active Self, Pharmacy Records  Ferrous Sulfate (IRON PO) [id number] Yes Take 1 tablet by mouth at bedtime. [provider] Taking Active Self, Pharmacy Records  Galcanezumab-gnlm [facility]) 120 MG/ML Ivory Broad [id number] Yes Inject 1 Pen into the skin every 30 (thirty) days. Suanne Marker, MD Taking Active Self, Pharmacy Records  Melatonin 10 MG CAPS [id number] Yes Take 10 mg by mouth at bedtime. [provider] Taking Active Self, Pharmacy Records  Multiple Vitamin (MULTIVITAMIN) tablet [id number] Yes Take 1 tablet by mouth at bedtime. [provider] Taking Active Self, Pharmacy Records  oxyCODONE-acetaminophen (PERCOCET/ROXICET) 5-325 MG tablet [id number] Yes Take 1 tablet by mouth every  8 (eight) hours as needed for severe pain (pain score 7-10) or moderate pain (pain score 4-6). Dorcas Carrow, MD Taking Active   Probiotic Product (FORTIFY PROBIOTIC WOMENS PO)  [id number] Yes Take 1 capsule by mouth at bedtime. [provider] Taking Active Self, Pharmacy Records  Rimegepant Sulfate (NURTEC) 75 MG TBDP [id number] Yes Take 1 tablet (75 mg total) by mouth daily as needed. Suanne Marker, MD Taking Active Self, Pharmacy Records           Med Note Jola Schmidt   Fri Dec 14, 2023 12:32 PM)    temazepam (RESTORIL) 15 MG capsule [id number] Yes TAKE 1 CAPSULE BY MOUTH AT BEDTIME AS NEEDED FOR SLEEP  Patient taking differently: Take 15 mg by mouth at bedtime.   Dorothyann Peng, MD Taking Active Self, Pharmacy Records  VITAMIN D PO [id number] Yes Take 1 capsule by mouth at bedtime. [provider] Taking Active Self, Pharmacy Records            Home Care and Equipment/Supplies: Were Home Health Services Ordered?: No Any new equipment or medical supplies ordered?: No  Functional Questionnaire: Do you need assistance with bathing/showering or dressing?: No Do you need assistance with meal preparation?: Yes Do you need assistance with eating?: No Do you have difficulty maintaining continence: No Do you need assistance with getting out of bed/getting out of a chair/moving?: Yes Do you have difficulty managing or taking your medications?: No  Follow up appointments reviewed: PCP Follow-up appointment confirmed?: Yes (patient wasn't aware that the a PCP appt had been made) MD Provider Line Number:[phone] Given: Yes Date of PCP follow-up appointment?: 12/23/22 Follow-up Provider: Dr Allyne Gee [facility] Follow-up appointment confirmed?: Yes Date of Specialist follow-up appointment?: 12/24/23 Follow-Up Specialty Provider:: Elease Hashimoto Vulpis Do you need transportation to your follow-up appointment?: No Do you understand care options if your condition(s) worsen?: Yes-patient verbalized understanding  SDOH Interventions Today    Flowsheet Row Most Recent Value  SDOH Interventions   Food Insecurity Interventions  Intervention Not Indicated  Housing Interventions Intervention Not Indicated  Transportation Interventions Intervention Not Indicated  Utilities Interventions Intervention Not Indicated      Interventions Today    Flowsheet Row Most Recent Value  Chronic Disease   Chronic disease during today's visit Other  [acute pulmonary embolus]  General Interventions   General Interventions Discussed/Reviewed General Interventions Discussed, General Interventions Reviewed, Doctor Visits  Doctor Visits Discussed/Reviewed Doctor Visits Discussed, Doctor Visits Reviewed, PCP, Specialist  PCP/Specialist Visits Compliance with follow-up visit  Nutrition Interventions   Nutrition Discussed/Reviewed Nutrition Discussed, Nutrition Reviewed  Pharmacy Interventions   Pharmacy Dicussed/Reviewed Pharmacy Topics Discussed, Pharmacy Topics Reviewed       No further follow up is needed TOC interventions discussed/reviewed: -Discussed/reviewed insurance/health plans benefits -Doctor visit discussed/reviewed -PCP -Doctor visits discussed/reviewed-Specialist -Provided Verbal Education: 30-day TOC program, nutrition, meds & their functions, symptom mgmt., fall/safety measures in the home  Gean Maidens BSN RN Population Health- Transition of Care Team.  [facility] [phone]

## 2023-12-17 NOTE — Transitions of Care (Post Inpatient/ED Visit) (Signed)
   12/17/2023  Name: ANAIRIS RICHESON MRN: [id number] DOB: May 06, 1981  Today's TOC FU Call Status: Today's TOC FU Call Status:: Unsuccessful Call (1st Attempt) Unsuccessful Call (1st Attempt) Date: 12/17/23  Attempted to reach the patient regarding the most recent Inpatient/ED visit.  Follow Up Plan: Additional outreach attempts will be made to reach the patient to complete the Transitions of Care (Post Inpatient/ED visit) call.   Gean Maidens BSN RN Population Health- Transition of Care Team.  [facility] [phone]

## 2023-12-20 ENCOUNTER — Encounter: Payer: Self-pay | Admitting: Internal Medicine

## 2023-12-24 ENCOUNTER — Ambulatory Visit (INDEPENDENT_AMBULATORY_CARE_PROVIDER_SITE_OTHER): Payer: Commercial Managed Care - PPO | Admitting: Internal Medicine

## 2023-12-24 ENCOUNTER — Encounter: Payer: Self-pay | Admitting: Internal Medicine

## 2023-12-24 VITALS — BP 110/70 | HR 98 | Temp 98.3°F | Ht 60.0 in | Wt 188.4 lb

## 2023-12-24 DIAGNOSIS — I2699 Other pulmonary embolism without acute cor pulmonale: Secondary | ICD-10-CM | POA: Diagnosis not present

## 2023-12-24 DIAGNOSIS — D649 Anemia, unspecified: Secondary | ICD-10-CM | POA: Diagnosis not present

## 2023-12-24 DIAGNOSIS — K9189 Other postprocedural complications and disorders of digestive system: Secondary | ICD-10-CM

## 2023-12-24 DIAGNOSIS — Z6836 Body mass index (BMI) 36.0-36.9, adult: Secondary | ICD-10-CM

## 2023-12-24 DIAGNOSIS — R102 Pelvic and perineal pain: Secondary | ICD-10-CM

## 2023-12-24 DIAGNOSIS — E66812 Obesity, class 2: Secondary | ICD-10-CM

## 2023-12-24 DIAGNOSIS — K567 Ileus, unspecified: Secondary | ICD-10-CM

## 2023-12-24 DIAGNOSIS — K8050 Calculus of bile duct without cholangitis or cholecystitis without obstruction: Secondary | ICD-10-CM

## 2023-12-24 LAB — POCT URINALYSIS DIPSTICK
Bilirubin, UA: NEGATIVE
Blood, UA: NEGATIVE
Glucose, UA: NEGATIVE
Ketones, UA: NEGATIVE
Leukocytes, UA: NEGATIVE
Nitrite, UA: NEGATIVE
Protein, UA: NEGATIVE
Spec Grav, UA: 1.025 (ref 1.010–1.025)
Urobilinogen, UA: 0.2 U/dL
pH, UA: 6.5 (ref 5.0–8.0)

## 2023-12-24 NOTE — Progress Notes (Signed)
 I,Jessica Banks, CMA,acting as a neurosurgeon for Jessica LOISE Slocumb, MD.,have documented all relevant documentation on the behalf of Jessica LOISE Slocumb, MD,as directed by  Jessica LOISE Slocumb, MD while in the presence of Jessica LOISE Slocumb, MD.  Subjective:  Patient ID: Jessica Banks , female    DOB: 17-Sep-1981 , 43 y.o.   MRN: [id number]  Chief Complaint  Patient presents with   Hospitalization Follow-up    HPI  Patient presents today for hospital follow up. She presented to [facility] on 12/26 c/o leg pain, fatigue and shortness of breath.  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  She had been doing well after surgery and her reflux has resolved.  However, she had persistent upr abdominal pain that progressed in severity and frequency.  She felt this was caused by her gallbladder.  She underwent internal hernia repair on 12/05/2023 with [place] surgery.  She was seen at urgent care on 12/10/2023 and treated for an UTI with nitrofurantoin , but still having dysuria.  For this hospitalization, she presented to [facility] ED due to dyspnea associated with left calf tenderness, chest tightness, dyspnea and fatigue.  Additionally, she was also having persistent abdominal pain.  ED workup revealed: CT scan abdomen pelvis consistent with ileus versus postop finding.  CT angiogram of the chest showed multiple small pulmonary embolism on the right upper pulmonary artery.  Admitted with surgical consultation.  She was discharged in stable condition on 12/29.  She is no longer having leg pain. She is still fatigued. Needs to have ST disability paperwork completed.       Past Medical History:  Diagnosis Date   Abnormal Pap smear    CIN I   Anemia    Anxiety    Depression    off meds now, ok now   Enlarged thyroid  gland    nromal function   GERD (gastroesophageal reflux disease)    Migraine     Obesity    Ovarian cyst    Vaginal Pap smear, abnormal    ok now     Family History  Problem Relation Age of Onset   Thyroid  disease Mother        s/p thyroidectomy   Arthritis Mother    Leukemia Maternal Grandmother    Stroke Maternal Grandmother    Cancer Maternal Grandfather        lung   Heart disease Paternal Grandmother    COPD Paternal Grandfather    Hypertension Maternal Uncle    Diabetes Maternal Uncle      Current Outpatient Medications:    acetaminophen  (TYLENOL ) 500 MG tablet, Take 2 tablets (1,000 mg total) by mouth every 6 (six) hours as needed., Disp: , Rfl:    amphetamine -dextroamphetamine  (ADDERALL  XR) 30 MG 24 hr capsule, Take 30 mg by mouth daily., Disp: , Rfl:    apixaban  (ELIQUIS ) 5 MG TABS tablet, Take 2 tablets (10 mg total) by mouth 2 (two) times daily for 6 days, THEN 1 tablet (5 mg total) 2 (two) times daily., Disp: 84 tablet, Rfl: 0   [START ON 01/21/2024] apixaban  (ELIQUIS ) 5 MG TABS tablet, Take 1 tablet (5 mg total) by mouth 2 (two) times daily., Disp: 60 tablet, Rfl: 2   butalbital -acetaminophen -caffeine  (FIORICET ) 50-325-40 MG tablet, Take 1 tablet by mouth 2 (two) times daily as needed. (Patient taking differently: Take 2 tablets by mouth daily as  needed for headache or migraine.), Disp: 30 tablet, Rfl: 0   cyclobenzaprine  (FLEXERIL ) 10 MG tablet, Take 1 tablet (10 mg total) by mouth daily as needed. (Patient taking differently: Take 10 mg by mouth daily as needed for muscle spasms.), Disp: 30 tablet, Rfl: 1   DULoxetine  (CYMBALTA ) 60 MG capsule, Take 1 capsule (60 mg total) by mouth daily. (Patient taking differently: Take 60 mg by mouth at bedtime.), Disp: 90 capsule, Rfl: 4   Ferrous Sulfate  (IRON  PO), Take 1 tablet by mouth at bedtime., Disp: , Rfl:    Galcanezumab -gnlm (EMGALITY ) 120 MG/ML SOAJ, Inject 1 Pen into the skin every 30 (thirty) days., Disp: 1.12 mL, Rfl: 6   Melatonin 10 MG CAPS, Take 10 mg by mouth at bedtime., Disp: , Rfl:     Multiple Vitamin (MULTIVITAMIN) tablet, Take 1 tablet by mouth at bedtime., Disp: , Rfl:    oxyCODONE -acetaminophen  (PERCOCET/ROXICET) 5-325 MG tablet, Take 1 tablet by mouth every 8 (eight) hours as needed for severe pain (pain score 7-10) or moderate pain (pain score 4-6)., Disp: 15 tablet, Rfl: 0   Probiotic Product (FORTIFY PROBIOTIC WOMENS PO), Take 1 capsule by mouth at bedtime., Disp: , Rfl:    Rimegepant Sulfate (NURTEC) 75 MG TBDP, Take 1 tablet (75 mg total) by mouth daily as needed., Disp: 8 tablet, Rfl: 6   temazepam  (RESTORIL ) 15 MG capsule, TAKE 1 CAPSULE BY MOUTH AT BEDTIME AS NEEDED FOR SLEEP (Patient taking differently: Take 15 mg by mouth at bedtime.), Disp: 30 capsule, Rfl: 2   VITAMIN D  PO, Take 1 capsule by mouth at bedtime., Disp: , Rfl:    Allergies  Allergen Reactions   Novocain [Procaine] Anaphylaxis   Nsaids Other (See Comments)    H/o gastric bypass = avoid NSAIDs   Povidone Iodine Rash    Rash     Review of Systems  Constitutional: Negative.   Respiratory: Negative.    Cardiovascular: Negative.   Gastrointestinal: Negative.   Neurological: Negative.   Psychiatric/Behavioral: Negative.       Today's Vitals   12/24/23 0926  BP: 110/70  Pulse: 98  Temp: 98.3 F (36.8 C)  SpO2: 98%  Weight: 188 lb 6.4 oz (85.5 kg)  Height: 5' (1.524 m)   Body mass index is 36.79 kg/m.  Wt Readings from Last 3 Encounters:  12/24/23 188 lb 6.4 oz (85.5 kg)  12/14/23 192 lb 14.4 oz (87.5 kg)  11/20/23 187 lb (84.8 kg)     Objective:  Physical Exam Vitals and nursing note reviewed.  Constitutional:      Appearance: Normal appearance. She is obese.  HENT:     Head: Normocephalic and atraumatic.  Eyes:     Extraocular Movements: Extraocular movements intact.  Cardiovascular:     Rate and Rhythm: Normal rate and regular rhythm.     Heart sounds: Normal heart sounds.  Pulmonary:     Effort: Pulmonary effort is normal.     Breath sounds: Normal breath sounds.   Abdominal:     Comments: Healed surgical scars  Musculoskeletal:     Cervical back: Normal range of motion.  Skin:    General: Skin is warm.  Neurological:     General: No focal deficit present.     Mental Status: She is alert.  Psychiatric:        Mood and Affect: Mood normal.        Behavior: Behavior normal.         Assessment And Plan:  Acute pulmonary embolism without acute cor pulmonale, unspecified pulmonary embolism type (HCC) Assessment & Plan: TCM PERFORMED. A MEMBER OF THE CLINICAL TEAM SPOKE WITH THE PATIENT UPON DISCHARGE. DISCHARGE SUMMARY WAS REVIEWED IN FULL DETAIL DURING THE VISIT. MEDS RECONCILED AND COMPARED TO DISCHARGE MEDS. MEDICATION LIST WAS UPDATED AND REVIEWED WITH THE PATIENT. GREATER THAN 50% FACE TO FACE TIME WAS SPENT IN COUNSELING AND COORDINATION OF CARE. ALL QUESTIONS WERE ANSWERED TO THE SATISFACTION OF THE PATIENT. Venous doppler neg for DVT, no right heart strain noted.  Importance of medication compliance was discussed with the patient. She should take meds for at least 90 days. Will follow CBC and BMP while on meds.     Postoperative ileus [facility]) Assessment & Plan: Her abdominal discomfort is improving. Her bowels are moving. She will use stool softener as needed.    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  She is encouraged to aim for at least 150 minutes of exercise per week once she has healed from her recent procedure.    She is encouraged to strive for BMI less than 30 to decrease cardiac risk. Advised to aim for at least 150 minutes of exercise per week.    Return if symptoms worsen or fail to improve.  Patient was given opportunity to ask  questions. Patient verbalized understanding of the plan and was able to repeat key elements of the plan. All questions were answered to their satisfaction.    I, Jessica LOISE Slocumb, MD, have reviewed all documentation for this visit. The documentation on 12/24/23 for the exam, diagnosis, procedures, and orders are all accurate and complete.   IF YOU HAVE BEEN REFERRED TO A SPECIALIST, IT MAY TAKE 1-2 WEEKS TO SCHEDULE/PROCESS THE REFERRAL. IF YOU HAVE NOT HEARD FROM US /SPECIALIST IN TWO WEEKS, PLEASE GIVE US  A CALL AT [phone] X [phone].   THE PATIENT IS ENCOURAGED TO PRACTICE SOCIAL DISTANCING DUE TO THE COVID-19 PANDEMIC.

## 2023-12-24 NOTE — Patient Instructions (Signed)
Pulmonary Embolism  A pulmonary embolism (PE) is a sudden blockage or decrease of blood flow in one or both lungs that happens when a clot travels into the arteries of the lung (pulmonary arteries). Most blockages come from a blood clot that forms in the vein of a leg or arm (deep vein thrombosis, DVT) and travels to the lungs. A clot is blood that has thickened into a gel or solid. PE is a dangerous and life-threatening condition that needs to be treated right away. What are the causes? This condition is usually caused by a blood clot that forms in a vein and moves to the lungs. In rare cases, it may be caused by air, fat, part of a tumor, or other tissue that moves through the veins and into the lungs. What increases the risk? The following factors may make you more likely to develop this condition: Experiencing a traumatic injury, such as breaking a hip or leg. Having: A spinal cord injury. Major surgery, especially hip or knee replacement, or surgery on parts of the nervous system or on the abdomen. A stroke. A blood-clotting disease. Long-term (chronic) lung or heart disease. Cancer, especially if you are being treated with chemotherapy. A central venous catheter. Taking medicines that contain estrogen. These include birth control pills and hormone replacement therapy. Being: Pregnant. In the period of time after your baby is delivered (postpartum). Older than age 60. Overweight. A smoker, especially if you have other risks. Not very active (sedentary), not being able to move at all, or spending long periods sitting, such as travel over 6 hours. You are also at a greater risk if you have a leg in a cast or splint. What are the signs or symptoms? Symptoms of this condition usually start suddenly and include: Shortness of breath during activity or at rest. Coughing, coughing up blood, or coughing up bloody mucus. Chest pain, back pain, or shoulder blade pain that gets worse with deep  breaths. Rapid or irregular heartbeat. Feeling light-headed or dizzy, or fainting. Feeling anxious. Pain and swelling in a leg. This is a symptom of DVT, which can lead to PE. How is this diagnosed? This condition may be diagnosed based on your medical history, a physical exam, and tests. Tests may include: Blood tests. An ECG (electrocardiogram) of the heart. A CT pulmonary angiogram. This test checks blood flow in and around your lungs. A ventilation-perfusion scan, also called a lung VQ scan. This test measures air flow and blood flow to the lungs. An ultrasound to check for a DVT. How is this treated? Treatment for this condition depends on many factors, such as the cause of your PE, your risk for bleeding or developing more clots, and other medical conditions you may have. Treatment aims to stop blood clots from forming or growing larger. In some cases, treatment may be aimed at breaking apart or removing the blood clot. Treatment may include: Medicines, such as: Blood thinning medicines, also called anticoagulants, to stop clots from forming and growing. Medicines that break apart clots (fibrinolytics). Procedures, such as: Using a flexible tube to remove a blood clot (embolectomy) or to deliver medicine to destroy it (catheter-directed thrombolysis). Surgery to remove the clot (surgical embolectomy). This is rare. You may need a combination of immediate, long-term, and extended treatments. Your treatment may continue for several months (maintenance therapy) or longer depending on your medical conditions. You and your health care provider will work together to choose the treatment program that is best for you.   Follow these instructions at home: Medicines Take over-the-counter and prescription medicines only as told by your health care provider. If you are taking blood thinners: Talk with your health care provider before you take any medicines that contain aspirin or NSAIDs, such as  ibuprofen. These medicines increase your risk for dangerous bleeding. Take your medicine exactly as told, at the same time every day. Avoid activities that could cause injury or bruising, and follow instructions about how to prevent falls. Wear a medical alert bracelet or carry a card that lists what medicines you take. Understand what foods and drugs interact with any medicines that you are taking. General instructions Ask your health care provider when you may return to your normal activities. Avoid sitting or lying for a long time without moving. Maintain a healthy weight. Ask your health care provider what weight is healthy for you. Do not use any products that contain nicotine or tobacco. These products include cigarettes, chewing tobacco, and vaping devices, such as e-cigarettes. If you need help quitting, ask your health care provider. Talk with your health care provider about any travel plans. It is important to make sure that you are still able to take your medicine while traveling. Keep all follow-up visits. This is important. Where to find more information American Lung Association: www.lung.org Centers for Disease Control and Prevention: www.cdc.gov Contact a health care provider if: You missed a dose of your blood thinner medicine. You have a fever. Get help right away if: You have: New or increased pain, swelling, warmth, or redness in an arm or leg. Shortness of breath that gets worse during activity or at rest. Worsening chest pain. A rapid or irregular heartbeat. A severe headache. Vision changes. A serious fall or accident, or you hit your head. Blood in your vomit, stool, or urine. A cut that will not stop bleeding. You cough up blood. You feel light-headed or dizzy, and that feeling does not go away. You cannot move your arms or legs. You are confused or have memory loss. These symptoms may represent a serious problem that is an emergency. Do not wait to see if the  symptoms will go away. Get medical help right away. Call your local emergency services (911 in the [place] Do not drive yourself to the hospital. Summary A pulmonary embolism (PE) is a serious and potentially life-threatening condition. It happens when a blood clot from one part of the body travels to the arteries of the lung, causing a sudden blockage or decrease of blood flow to the lungs. This may result in shortness of breath, chest pain, dizziness, and fainting. Treatments for this condition usually include medicines to thin your blood (anticoagulants) or medicines to break apart blood clots. If you are given blood thinners, take your medicine exactly as told by your health care provider, at the same time every day. This is important. Understand what foods and drugs interact with any medicines that you are taking. If you have signs of PE or DVT, call your local emergency services (911 in the [place] This information is not intended to replace advice given to you by your health care provider. Make sure you discuss any questions you have with your health care provider. Document Revised: 11/05/2020 Document Reviewed: 11/05/2020 Elsevier Patient Education  2024 [employer].  

## 2023-12-25 LAB — CBC
Hematocrit: 35.1 % (ref 34.0–46.6)
Hemoglobin: 11.1 g/dL (ref 11.1–15.9)
MCH: 28.8 pg (ref 26.6–33.0)
MCHC: 31.6 g/dL (ref 31.5–35.7)
MCV: 91 fL (ref 79–97)
Platelets: 546 10*3/uL — ABNORMAL HIGH (ref 150–450)
RBC: 3.85 x10E6/uL (ref 3.77–5.28)
RDW: 12.4 % (ref 11.7–15.4)
WBC: 4.2 10*3/uL (ref 3.4–10.8)

## 2023-12-25 LAB — IRON,TIBC AND FERRITIN PANEL
Ferritin: 75 ng/mL (ref 15–150)
Iron Saturation: 23 % (ref 15–55)
Iron: 69 ug/dL (ref 27–159)
Total Iron Binding Capacity: 294 ug/dL (ref 250–450)
UIBC: 225 ug/dL (ref 131–425)

## 2023-12-30 DIAGNOSIS — K567 Ileus, unspecified: Secondary | ICD-10-CM | POA: Insufficient documentation

## 2023-12-30 NOTE — Assessment & Plan Note (Signed)
 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  She is encouraged to aim for at least 150 minutes of exercise per week once she has healed from her recent procedure.

## 2023-12-30 NOTE — Assessment & Plan Note (Addendum)
 TCM PERFORMED. A MEMBER OF THE CLINICAL TEAM SPOKE WITH THE PATIENT UPON DISCHARGE. DISCHARGE SUMMARY WAS REVIEWED IN FULL DETAIL DURING THE VISIT. MEDS RECONCILED AND COMPARED TO DISCHARGE MEDS. MEDICATION LIST WAS UPDATED AND REVIEWED WITH THE PATIENT. GREATER THAN 50% FACE TO FACE TIME WAS SPENT IN COUNSELING AND COORDINATION OF CARE. ALL QUESTIONS WERE ANSWERED TO THE SATISFACTION OF THE PATIENT. Venous doppler neg for DVT, no right heart strain noted.  Importance of medication compliance was discussed with the patient. She should take meds for at least 90 days. Will follow CBC and BMP while on meds.

## 2023-12-30 NOTE — Assessment & Plan Note (Addendum)
 Her abdominal discomfort is improving. Her bowels are moving. She will use stool softener as needed.

## 2024-01-08 ENCOUNTER — Encounter: Payer: Self-pay | Admitting: Diagnostic Neuroimaging

## 2024-01-15 ENCOUNTER — Other Ambulatory Visit: Payer: Self-pay

## 2024-01-15 ENCOUNTER — Emergency Department (HOSPITAL_BASED_OUTPATIENT_CLINIC_OR_DEPARTMENT_OTHER)
Admission: EM | Admit: 2024-01-15 | Discharge: 2024-01-16 | Disposition: A | Discharge status: Home / Self Care | Payer: Commercial Managed Care - PPO | Attending: Emergency Medicine | Admitting: Emergency Medicine

## 2024-01-15 ENCOUNTER — Encounter (HOSPITAL_BASED_OUTPATIENT_CLINIC_OR_DEPARTMENT_OTHER): Payer: Self-pay | Admitting: Radiology

## 2024-01-15 DIAGNOSIS — R1012 Left upper quadrant pain: Secondary | ICD-10-CM | POA: Diagnosis not present

## 2024-01-15 DIAGNOSIS — R1013 Epigastric pain: Secondary | ICD-10-CM | POA: Insufficient documentation

## 2024-01-15 DIAGNOSIS — R109 Unspecified abdominal pain: Secondary | ICD-10-CM

## 2024-01-15 DIAGNOSIS — Z79899 Other long term (current) drug therapy: Secondary | ICD-10-CM | POA: Insufficient documentation

## 2024-01-15 DIAGNOSIS — Z7901 Long term (current) use of anticoagulants: Secondary | ICD-10-CM | POA: Diagnosis not present

## 2024-01-15 NOTE — ED Triage Notes (Signed)
Pt states that she has pain in her left abd that wraps around to her back to the right side of her back. Pt states that it stated around 4pm today. Pt states she had emergency surgery for hernia causing blockage and cholecystectomy. PT states she developed a ileus after and developed pulmonary embolism so she is on Eliquis now. Pt states she has been having pain and saw her surgeon 2 weeks ago and they believed the pain to be nerve pain. Pt states she is here today due to the pain being different. Pt states she has taken her home norco, flexeril, used heat, and nothing is making it any better. Pt states normal urine and BM. Pt states she had a normal WBC count and no nausea when she had her first obstruction.

## 2024-01-16 ENCOUNTER — Emergency Department (HOSPITAL_BASED_OUTPATIENT_CLINIC_OR_DEPARTMENT_OTHER): Payer: Commercial Managed Care - PPO

## 2024-01-16 ENCOUNTER — Other Ambulatory Visit (HOSPITAL_COMMUNITY): Payer: Self-pay | Admitting: Surgery

## 2024-01-16 ENCOUNTER — Telehealth: Payer: Self-pay | Admitting: *Deleted

## 2024-01-16 DIAGNOSIS — R140 Abdominal distension (gaseous): Secondary | ICD-10-CM

## 2024-01-16 DIAGNOSIS — Z9884 Bariatric surgery status: Secondary | ICD-10-CM

## 2024-01-16 DIAGNOSIS — K862 Cyst of pancreas: Secondary | ICD-10-CM

## 2024-01-16 LAB — CBC
HCT: 37.5 % (ref 36.0–46.0)
Hemoglobin: 11.9 g/dL — ABNORMAL LOW (ref 12.0–15.0)
MCH: 29.1 pg (ref 26.0–34.0)
MCHC: 31.7 g/dL (ref 30.0–36.0)
MCV: 91.7 fL (ref 80.0–100.0)
Platelets: 312 10*3/uL (ref 150–400)
RBC: 4.09 MIL/uL (ref 3.87–5.11)
RDW: 13.9 % (ref 11.5–15.5)
WBC: 6.8 10*3/uL (ref 4.0–10.5)
nRBC: 0 % (ref 0.0–0.2)

## 2024-01-16 LAB — COMPREHENSIVE METABOLIC PANEL
ALT: 38 U/L (ref 0–44)
AST: 37 U/L (ref 15–41)
Albumin: 3.6 g/dL (ref 3.5–5.0)
Alkaline Phosphatase: 148 U/L — ABNORMAL HIGH (ref 38–126)
Anion gap: 9 (ref 5–15)
BUN: 8 mg/dL (ref 6–20)
CO2: 22 mmol/L (ref 22–32)
Calcium: 8.9 mg/dL (ref 8.9–10.3)
Chloride: 105 mmol/L (ref 98–111)
Creatinine, Ser: 0.52 mg/dL (ref 0.44–1.00)
GFR, Estimated: >60 mL/min (ref >60)
Glucose, Bld: 99 mg/dL (ref 70–99)
Potassium: 3.5 mmol/L (ref 3.5–5.1)
Sodium: 136 mmol/L (ref 135–145)
Total Bilirubin: 0.3 mg/dL (ref 0.0–1.2)
Total Protein: 7 g/dL (ref 6.5–8.1)

## 2024-01-16 LAB — URINALYSIS, ROUTINE W REFLEX MICROSCOPIC
Bilirubin Urine: NEGATIVE
Glucose, UA: NEGATIVE mg/dL
Hgb urine dipstick: NEGATIVE
Ketones, ur: NEGATIVE mg/dL
Leukocytes,Ua: NEGATIVE
Nitrite: NEGATIVE
Protein, ur: NEGATIVE mg/dL
Specific Gravity, Urine: 1.025 (ref 1.005–1.030)
pH: 5.5 (ref 5.0–8.0)

## 2024-01-16 LAB — PREGNANCY, URINE: Preg Test, Ur: NEGATIVE

## 2024-01-16 LAB — LIPASE, BLOOD: Lipase: 22 U/L (ref 11–51)

## 2024-01-16 MED ORDER — ONDANSETRON HCL 4 MG/2ML IJ SOLN
4.0000 mg | Freq: Once | INTRAMUSCULAR | Status: AC
  Administered 2024-01-16: 4 mg via INTRAVENOUS
  Filled 2024-01-16: qty 2

## 2024-01-16 MED ORDER — IOHEXOL 300 MG/ML  SOLN
100.0000 mL | Freq: Once | INTRAMUSCULAR | Status: AC | PRN
  Administered 2024-01-16: 100 mL via INTRAVENOUS

## 2024-01-16 MED ORDER — SODIUM CHLORIDE 0.9 % IV BOLUS
1000.0000 mL | Freq: Once | INTRAVENOUS | Status: AC
  Administered 2024-01-16: 1000 mL via INTRAVENOUS

## 2024-01-16 MED ORDER — HYDROMORPHONE HCL 1 MG/ML IJ SOLN
1.0000 mg | Freq: Once | INTRAMUSCULAR | Status: AC
  Administered 2024-01-16: 1 mg via INTRAVENOUS
  Filled 2024-01-16: qty 1

## 2024-01-16 NOTE — Telephone Encounter (Signed)
Pt said PA is needed for Emgality 120 mg injection.

## 2024-01-16 NOTE — ED Provider Notes (Signed)
[facility] Provider Note   CSN: [id number] Arrival date & time: 01/15/24  2307     History  Chief Complaint  Patient presents with   Abdominal Pain    Jessica Banks is a 43 y.o. female.  Patient is a 43 year old female with history of gastric bypass, recent surgery for internal hernia/cholecystectomy, and pulmonary embolism diagnosed postoperatively.  Patient presenting today for evaluation of abdominal pain.  She describes episodes of pain to her left upper quadrant that come and go.  These have been occurring since her most recent surgery.  This evening she tried taking hydrocodone, Flexeril, and using a warm compress, but this did not help.  She denies having any vomiting.  She denies any changes in bowel habits.  Pain seems to be worse when she moves or palpates the area.  It is also relieved with lying on her right side.  Patient has an appointment upcoming today at 60 with her surgeon.  The history is provided by the patient.       Home Medications Prior to Admission medications   Medication Sig Start Date End Date Taking? Authorizing Provider  acetaminophen (TYLENOL) 500 MG tablet Take 2 tablets (1,000 mg total) by mouth every 6 (six) hours as needed. 12/07/23   Barnetta Chapel, PA-C  amphetamine-dextroamphetamine (ADDERALL XR) 30 MG 24 hr capsule Take 30 mg by mouth daily.    [provider]  apixaban (ELIQUIS) 5 MG TABS tablet Take 2 tablets (10 mg total) by mouth 2 (two) times daily for 6 days, THEN 1 tablet (5 mg total) 2 (two) times daily. 12/16/23 01/21/24  Dorcas Carrow, MD  apixaban (ELIQUIS) 5 MG TABS tablet Take 1 tablet (5 mg total) by mouth 2 (two) times daily. 01/21/24 04/20/24  Dorcas Carrow, MD  butalbital-acetaminophen-caffeine (FIORICET) 50-325-40 MG tablet Take 1 tablet by mouth 2 (two) times daily as needed. Patient taking differently: Take 2 tablets by mouth daily as needed for headache or migraine.  11/19/23   Penumalli, Glenford Bayley, MD  cyclobenzaprine (FLEXERIL) 10 MG tablet Take 1 tablet (10 mg total) by mouth daily as needed. Patient taking differently: Take 10 mg by mouth daily as needed for muscle spasms. 11/19/23   Penumalli, Glenford Bayley, MD  DULoxetine (CYMBALTA) 60 MG capsule Take 1 capsule (60 mg total) by mouth daily. Patient taking differently: Take 60 mg by mouth at bedtime. 11/19/23   Penumalli, Glenford Bayley, MD  Ferrous Sulfate (IRON PO) Take 1 tablet by mouth at bedtime.    [provider]  Galcanezumab-gnlm (EMGALITY) 120 MG/ML SOAJ Inject 1 Pen into the skin every 30 (thirty) days. 11/19/23   Penumalli, Glenford Bayley, MD  Melatonin 10 MG CAPS Take 10 mg by mouth at bedtime.    [provider]  Multiple Vitamin (MULTIVITAMIN) tablet Take 1 tablet by mouth at bedtime.    [provider]  oxyCODONE-acetaminophen (PERCOCET/ROXICET) 5-325 MG tablet Take 1 tablet by mouth every 8 (eight) hours as needed for severe pain (pain score 7-10) or moderate pain (pain score 4-6). 12/16/23   Dorcas Carrow, MD  Probiotic Product (FORTIFY PROBIOTIC WOMENS PO) Take 1 capsule by mouth at bedtime.    [provider]  Rimegepant Sulfate (NURTEC) 75 MG TBDP Take 1 tablet (75 mg total) by mouth daily as needed. 11/19/23   Penumalli, Glenford Bayley, MD  temazepam (RESTORIL) 15 MG capsule TAKE 1 CAPSULE BY MOUTH AT BEDTIME AS NEEDED FOR SLEEP Patient taking differently: Take  15 mg by mouth at bedtime. 10/17/23   Dorothyann Peng, MD  VITAMIN D PO Take 1 capsule by mouth at bedtime.    [provider]      Allergies    Novocain [procaine], Nsaids, and Povidone iodine    Review of Systems   Review of Systems  All other systems reviewed and are negative.   Physical Exam Updated Vital Signs BP 130/82 (BP Location: Left Arm)   Pulse 91   Temp 97.8 F (36.6 C) (Oral)   Resp 18   Ht 5' (1.524 m)   Wt 83 kg   SpO2 100%   BMI 35.74 kg/m  Physical Exam Vitals and nursing  note reviewed.  Constitutional:      General: She is not in acute distress.    Appearance: She is well-developed. She is not diaphoretic.  HENT:     Head: Normocephalic and atraumatic.  Cardiovascular:     Rate and Rhythm: Normal rate and regular rhythm.     Heart sounds: No murmur heard.    No friction rub. No gallop.  Pulmonary:     Effort: Pulmonary effort is normal. No respiratory distress.     Breath sounds: Normal breath sounds. No wheezing.  Abdominal:     General: Bowel sounds are normal. There is no distension.     Palpations: Abdomen is soft.     Tenderness: There is abdominal tenderness in the epigastric area and left upper quadrant. There is no right CVA tenderness, left CVA tenderness, guarding or rebound.  Musculoskeletal:        General: Normal range of motion.     Cervical back: Normal range of motion and neck supple.  Skin:    General: Skin is warm and dry.  Neurological:     General: No focal deficit present.     Mental Status: She is alert and oriented to person, place, and time.     ED Results / Procedures / Treatments   Labs (all labs ordered are listed, but only abnormal results are displayed) Labs Reviewed  LIPASE, BLOOD  COMPREHENSIVE METABOLIC PANEL  CBC  URINALYSIS, ROUTINE W REFLEX MICROSCOPIC  PREGNANCY, URINE    EKG None  Radiology No results found.  Procedures Procedures    Medications Ordered in ED Medications  sodium chloride 0.9 % bolus 1,000 mL (has no administration in time range)  ondansetron (ZOFRAN) injection 4 mg (has no administration in time range)  HYDROmorphone (DILAUDID) injection 1 mg (has no administration in time range)    ED Course/ Medical Decision Making/ A&P  Patient is a 43 year old female with past medical history as per HPI presenting with abdominal pain.  Patient arrives with stable vital signs and is afebrile.  Physical examination reveals tenderness to the epigastric region, but no peritoneal  signs.  Laboratory studies obtained including CBC, CMP, and lipase.  There is no leukocytosis, no elevation of liver or pancreatic enzymes, and no electrolyte derangement.  Urinalysis is clear and pregnancy test is negative.  CT scan of the abdomen and pelvis obtained showing only trace ascites and a subtle abnormality in the tail of the pancreas, but nothing acute.  There is no sign of obstruction or blockage.  Patient has received IV fluids along with Dilaudid for pain and Zofran for nausea and seems to be feeling somewhat better.  She has an appointment with her surgeon later today and I have advised her to keep this appointment.  To return as needed if symptoms worsen  or change.  Cause of the abdominal pain unclear, but nothing today appears emergent.  Final Clinical Impression(s) / ED Diagnoses Final diagnoses:  None    Rx / DC Orders ED Discharge Orders     None         Geoffery Lyons, MD 01/16/24 [phone]

## 2024-01-16 NOTE — Discharge Instructions (Signed)
Continue medications as previously prescribed.  Follow-up with your surgeon today as scheduled.

## 2024-01-17 ENCOUNTER — Other Ambulatory Visit (HOSPITAL_COMMUNITY): Payer: Self-pay

## 2024-01-17 ENCOUNTER — Telehealth: Payer: Self-pay | Admitting: Pharmacy Technician

## 2024-01-17 NOTE — Telephone Encounter (Signed)
PA request has been Submitted. New Encounter created for follow up. For additional info see Pharmacy Prior Auth telephone encounter from 01/17/2024.

## 2024-01-17 NOTE — Telephone Encounter (Signed)
Pharmacy Patient Advocate Encounter  Received notification from [facility] that Prior Authorization for Emgality 120MG /ML auto-injectors (migraine)  has been APPROVED from 01/17/2024 to 01/16/2025   PA #/Case ID/Reference #: [id number]

## 2024-01-17 NOTE — Telephone Encounter (Signed)
Pharmacy Patient Advocate Encounter   Received notification from Pt Calls Messages that prior authorization for Emgality 120MG /ML auto-injectors (migraine) is required/requested.   Insurance verification completed.   The patient is insured through [facility] .   Per test claim: PA required; PA submitted to above mentioned insurance via CoverMyMeds Key/confirmation #/EOC [id number] Status is pending

## 2024-01-17 NOTE — Telephone Encounter (Signed)
Spoke to pt made her aware that PA  Emgalty   complete waiting on approval from insurance   Pt expressed understanding and thanked me for calling

## 2024-01-18 NOTE — Telephone Encounter (Signed)
 Pt informed via [employer].

## 2024-01-22 ENCOUNTER — Encounter (HOSPITAL_COMMUNITY): Payer: Self-pay

## 2024-01-22 ENCOUNTER — Other Ambulatory Visit: Payer: Self-pay

## 2024-01-22 ENCOUNTER — Inpatient Hospital Stay (HOSPITAL_COMMUNITY)
Admission: EM | Admit: 2024-01-22 | Discharge: 2024-01-26 | DRG: 394 | Disposition: A | Discharge status: Home / Self Care | Payer: Commercial Managed Care - PPO | Attending: Internal Medicine | Admitting: Internal Medicine

## 2024-01-22 ENCOUNTER — Observation Stay (HOSPITAL_COMMUNITY): Payer: Commercial Managed Care - PPO

## 2024-01-22 DIAGNOSIS — R7401 Elevation of levels of liver transaminase levels: Secondary | ICD-10-CM | POA: Diagnosis not present

## 2024-01-22 DIAGNOSIS — R933 Abnormal findings on diagnostic imaging of other parts of digestive tract: Secondary | ICD-10-CM

## 2024-01-22 DIAGNOSIS — R030 Elevated blood-pressure reading, without diagnosis of hypertension: Secondary | ICD-10-CM | POA: Diagnosis present

## 2024-01-22 DIAGNOSIS — Z87891 Personal history of nicotine dependence: Secondary | ICD-10-CM | POA: Diagnosis not present

## 2024-01-22 DIAGNOSIS — R188 Other ascites: Secondary | ICD-10-CM | POA: Diagnosis present

## 2024-01-22 DIAGNOSIS — R748 Abnormal levels of other serum enzymes: Secondary | ICD-10-CM | POA: Diagnosis present

## 2024-01-22 DIAGNOSIS — Z8249 Family history of ischemic heart disease and other diseases of the circulatory system: Secondary | ICD-10-CM

## 2024-01-22 DIAGNOSIS — F909 Attention-deficit hyperactivity disorder, unspecified type: Secondary | ICD-10-CM | POA: Diagnosis present

## 2024-01-22 DIAGNOSIS — K9589 Other complications of other bariatric procedure: Principal | ICD-10-CM | POA: Diagnosis present

## 2024-01-22 DIAGNOSIS — Z87410 Personal history of cervical dysplasia: Secondary | ICD-10-CM

## 2024-01-22 DIAGNOSIS — R1010 Upper abdominal pain, unspecified: Secondary | ICD-10-CM | POA: Diagnosis not present

## 2024-01-22 DIAGNOSIS — Z884 Allergy status to anesthetic agent status: Secondary | ICD-10-CM | POA: Diagnosis not present

## 2024-01-22 DIAGNOSIS — R1013 Epigastric pain: Secondary | ICD-10-CM | POA: Diagnosis not present

## 2024-01-22 DIAGNOSIS — Z7901 Long term (current) use of anticoagulants: Secondary | ICD-10-CM

## 2024-01-22 DIAGNOSIS — R109 Unspecified abdominal pain: Secondary | ICD-10-CM | POA: Diagnosis not present

## 2024-01-22 DIAGNOSIS — K862 Cyst of pancreas: Secondary | ICD-10-CM

## 2024-01-22 DIAGNOSIS — F32A Depression, unspecified: Secondary | ICD-10-CM | POA: Diagnosis present

## 2024-01-22 DIAGNOSIS — Z79899 Other long term (current) drug therapy: Secondary | ICD-10-CM

## 2024-01-22 DIAGNOSIS — F419 Anxiety disorder, unspecified: Secondary | ICD-10-CM | POA: Diagnosis present

## 2024-01-22 DIAGNOSIS — R1012 Left upper quadrant pain: Secondary | ICD-10-CM

## 2024-01-22 DIAGNOSIS — Z9049 Acquired absence of other specified parts of digestive tract: Secondary | ICD-10-CM | POA: Diagnosis not present

## 2024-01-22 DIAGNOSIS — K224 Dyskinesia of esophagus: Secondary | ICD-10-CM | POA: Diagnosis present

## 2024-01-22 DIAGNOSIS — K869 Disease of pancreas, unspecified: Secondary | ICD-10-CM | POA: Diagnosis present

## 2024-01-22 DIAGNOSIS — D649 Anemia, unspecified: Secondary | ICD-10-CM | POA: Diagnosis present

## 2024-01-22 DIAGNOSIS — Z9884 Bariatric surgery status: Secondary | ICD-10-CM | POA: Diagnosis not present

## 2024-01-22 DIAGNOSIS — R739 Hyperglycemia, unspecified: Secondary | ICD-10-CM | POA: Diagnosis present

## 2024-01-22 DIAGNOSIS — Z86711 Personal history of pulmonary embolism: Secondary | ICD-10-CM | POA: Diagnosis not present

## 2024-01-22 DIAGNOSIS — E669 Obesity, unspecified: Secondary | ICD-10-CM | POA: Diagnosis present

## 2024-01-22 DIAGNOSIS — E876 Hypokalemia: Secondary | ICD-10-CM | POA: Diagnosis present

## 2024-01-22 DIAGNOSIS — R112 Nausea with vomiting, unspecified: Secondary | ICD-10-CM

## 2024-01-22 LAB — COMPREHENSIVE METABOLIC PANEL
ALT: 47 U/L — ABNORMAL HIGH (ref 0–44)
AST: 90 U/L — ABNORMAL HIGH (ref 15–41)
Albumin: 3.9 g/dL (ref 3.5–5.0)
Alkaline Phosphatase: 181 U/L — ABNORMAL HIGH (ref 38–126)
Anion gap: 12 (ref 5–15)
BUN: 13 mg/dL (ref 6–20)
CO2: 23 mmol/L (ref 22–32)
Calcium: 9.1 mg/dL (ref 8.9–10.3)
Chloride: 103 mmol/L (ref 98–111)
Creatinine, Ser: 0.46 mg/dL (ref 0.44–1.00)
GFR, Estimated: >60 mL/min (ref >60)
Glucose, Bld: 219 mg/dL — ABNORMAL HIGH (ref 70–99)
Potassium: 3.2 mmol/L — ABNORMAL LOW (ref 3.5–5.1)
Sodium: 138 mmol/L (ref 135–145)
Total Bilirubin: 0.6 mg/dL (ref 0.0–1.2)
Total Protein: 7.6 g/dL (ref 6.5–8.1)

## 2024-01-22 LAB — LACTIC ACID, PLASMA: Lactic Acid, Venous: 1.7 mmol/L (ref 0.5–1.9)

## 2024-01-22 LAB — BASIC METABOLIC PANEL
Anion gap: 11 (ref 5–15)
BUN: 13 mg/dL (ref 6–20)
CO2: 22 mmol/L (ref 22–32)
Calcium: 9 mg/dL (ref 8.9–10.3)
Chloride: 104 mmol/L (ref 98–111)
Creatinine, Ser: 0.69 mg/dL (ref 0.44–1.00)
GFR, Estimated: >60 mL/min (ref >60)
Glucose, Bld: 194 mg/dL — ABNORMAL HIGH (ref 70–99)
Potassium: 3.8 mmol/L (ref 3.5–5.1)
Sodium: 137 mmol/L (ref 135–145)

## 2024-01-22 LAB — CBC
HCT: 40.9 % (ref 36.0–46.0)
Hemoglobin: 12.5 g/dL (ref 12.0–15.0)
MCH: 28.9 pg (ref 26.0–34.0)
MCHC: 30.6 g/dL (ref 30.0–36.0)
MCV: 94.7 fL (ref 80.0–100.0)
Platelets: 334 10*3/uL (ref 150–400)
RBC: 4.32 MIL/uL (ref 3.87–5.11)
RDW: 13.6 % (ref 11.5–15.5)
WBC: 7.6 10*3/uL (ref 4.0–10.5)
nRBC: 0 % (ref 0.0–0.2)

## 2024-01-22 LAB — CBC WITH DIFFERENTIAL/PLATELET
Abs Immature Granulocytes: 0.05 10*3/uL (ref 0.00–0.07)
Basophils Absolute: 0 10*3/uL (ref 0.0–0.1)
Basophils Relative: 0 %
Eosinophils Absolute: 0.1 10*3/uL (ref 0.0–0.5)
Eosinophils Relative: 0 %
HCT: 45.1 % (ref 36.0–46.0)
Hemoglobin: 13.7 g/dL (ref 12.0–15.0)
Immature Granulocytes: 0 %
Lymphocytes Relative: 16 %
Lymphs Abs: 2.1 10*3/uL (ref 0.7–4.0)
MCH: 28.5 pg (ref 26.0–34.0)
MCHC: 30.4 g/dL (ref 30.0–36.0)
MCV: 94 fL (ref 80.0–100.0)
Monocytes Absolute: 0.4 10*3/uL (ref 0.1–1.0)
Monocytes Relative: 3 %
Neutro Abs: 10.2 10*3/uL — ABNORMAL HIGH (ref 1.7–7.7)
Neutrophils Relative %: 81 %
Platelets: 382 10*3/uL (ref 150–400)
RBC: 4.8 MIL/uL (ref 3.87–5.11)
RDW: 13.7 % (ref 11.5–15.5)
WBC: 12.8 10*3/uL — ABNORMAL HIGH (ref 4.0–10.5)
nRBC: 0 % (ref 0.0–0.2)

## 2024-01-22 LAB — URINALYSIS, ROUTINE W REFLEX MICROSCOPIC
Bilirubin Urine: NEGATIVE
Glucose, UA: NEGATIVE mg/dL
Hgb urine dipstick: NEGATIVE
Ketones, ur: NEGATIVE mg/dL
Leukocytes,Ua: NEGATIVE
Nitrite: NEGATIVE
Protein, ur: NEGATIVE mg/dL
Specific Gravity, Urine: 1.032 — ABNORMAL HIGH (ref 1.005–1.030)
pH: 5 (ref 5.0–8.0)

## 2024-01-22 LAB — HEPARIN LEVEL (UNFRACTIONATED)
Heparin Unfractionated: 0.79 [IU]/mL — ABNORMAL HIGH (ref 0.30–0.70)
Heparin Unfractionated: 1.04 [IU]/mL — ABNORMAL HIGH (ref 0.30–0.70)

## 2024-01-22 LAB — HEPATIC FUNCTION PANEL
ALT: 42 U/L (ref 0–44)
AST: 82 U/L — ABNORMAL HIGH (ref 15–41)
Albumin: 3.6 g/dL (ref 3.5–5.0)
Alkaline Phosphatase: 186 U/L — ABNORMAL HIGH (ref 38–126)
Bilirubin, Direct: 0.2 mg/dL (ref 0.0–0.2)
Indirect Bilirubin: 0.4 mg/dL (ref 0.3–0.9)
Total Bilirubin: 0.6 mg/dL (ref 0.0–1.2)
Total Protein: 7.5 g/dL (ref 6.5–8.1)

## 2024-01-22 LAB — HEMOGLOBIN A1C
Hgb A1c MFr Bld: 5.6 % (ref 4.8–5.6)
Mean Plasma Glucose: 114.02 mg/dL

## 2024-01-22 LAB — APTT
aPTT: 59 s — ABNORMAL HIGH (ref 24–36)
aPTT: 79 s — ABNORMAL HIGH (ref 24–36)

## 2024-01-22 LAB — LIPASE, BLOOD: Lipase: 23 U/L (ref 11–51)

## 2024-01-22 LAB — HCG, SERUM, QUALITATIVE: Preg, Serum: NEGATIVE

## 2024-01-22 MED ORDER — KETOROLAC TROMETHAMINE 15 MG/ML IJ SOLN
15.0000 mg | Freq: Once | INTRAMUSCULAR | Status: AC
  Administered 2024-01-22: 15 mg via INTRAVENOUS
  Filled 2024-01-22: qty 1

## 2024-01-22 MED ORDER — CARMEX CLASSIC LIP BALM EX OINT
TOPICAL_OINTMENT | Freq: Once | CUTANEOUS | Status: AC
  Administered 2024-01-22: 1 via TOPICAL
  Filled 2024-01-22: qty 10

## 2024-01-22 MED ORDER — PANTOPRAZOLE SODIUM 40 MG IV SOLR
40.0000 mg | Freq: Two times a day (BID) | INTRAVENOUS | Status: DC
  Administered 2024-01-22 – 2024-01-26 (×9): 40 mg via INTRAVENOUS
  Filled 2024-01-22 (×9): qty 10

## 2024-01-22 MED ORDER — FENTANYL CITRATE PF 50 MCG/ML IJ SOSY
50.0000 ug | PREFILLED_SYRINGE | INTRAMUSCULAR | Status: AC | PRN
Start: 2024-01-22 — End: 2024-01-22
  Administered 2024-01-22 (×2): 50 ug via INTRAVENOUS
  Filled 2024-01-22 (×2): qty 1

## 2024-01-22 MED ORDER — HYDROMORPHONE HCL 1 MG/ML IJ SOLN
0.5000 mg | INTRAMUSCULAR | Status: DC | PRN
  Administered 2024-01-22: 0.5 mg via INTRAVENOUS
  Filled 2024-01-22: qty 1

## 2024-01-22 MED ORDER — MORPHINE SULFATE (PF) 4 MG/ML IV SOLN
5.0000 mg | INTRAVENOUS | Status: DC | PRN
  Administered 2024-01-22: 5 mg via INTRAVENOUS
  Filled 2024-01-22: qty 2

## 2024-01-22 MED ORDER — DICYCLOMINE HCL 10 MG/ML IM SOLN
20.0000 mg | Freq: Once | INTRAMUSCULAR | Status: AC
  Administered 2024-01-22: 20 mg via INTRAMUSCULAR
  Filled 2024-01-22: qty 2

## 2024-01-22 MED ORDER — HYDROMORPHONE HCL 1 MG/ML IJ SOLN
1.0000 mg | INTRAMUSCULAR | Status: DC | PRN
  Administered 2024-01-22 – 2024-01-23 (×5): 1 mg via INTRAVENOUS
  Filled 2024-01-22 (×6): qty 1

## 2024-01-22 MED ORDER — ONDANSETRON HCL 4 MG/2ML IJ SOLN: 4.0000 mg | Freq: Once | INTRAMUSCULAR | Status: DC | PRN

## 2024-01-22 MED ORDER — POTASSIUM CHLORIDE 10 MEQ/100ML IV SOLN
10.0000 meq | INTRAVENOUS | Status: AC
  Administered 2024-01-22 (×3): 10 meq via INTRAVENOUS
  Filled 2024-01-22 (×3): qty 100

## 2024-01-22 MED ORDER — HYDROMORPHONE HCL 1 MG/ML IJ SOLN
1.0000 mg | Freq: Once | INTRAMUSCULAR | Status: AC
  Administered 2024-01-22: 1 mg via INTRAVENOUS
  Filled 2024-01-22: qty 1

## 2024-01-22 MED ORDER — ONDANSETRON HCL 4 MG/2ML IJ SOLN
4.0000 mg | Freq: Four times a day (QID) | INTRAMUSCULAR | Status: DC | PRN
  Administered 2024-01-22 – 2024-01-26 (×3): 4 mg via INTRAVENOUS
  Filled 2024-01-22 (×3): qty 2

## 2024-01-22 MED ORDER — HYDRALAZINE HCL 20 MG/ML IJ SOLN: 5.0000 mg | INTRAMUSCULAR | Status: DC | PRN

## 2024-01-22 MED ORDER — METOCLOPRAMIDE HCL 5 MG/ML IJ SOLN
10.0000 mg | INTRAMUSCULAR | Status: AC
  Administered 2024-01-22: 10 mg via INTRAVENOUS
  Filled 2024-01-22: qty 2

## 2024-01-22 MED ORDER — LACTATED RINGERS IV SOLN: INTRAVENOUS | Status: AC

## 2024-01-22 MED ORDER — HEPARIN (PORCINE) 25000 UT/250ML-% IV SOLN
1300.0000 [IU]/h | INTRAVENOUS | Status: AC
Start: 2024-01-22 — End: 2024-01-24
  Administered 2024-01-22 – 2024-01-23 (×2): 1300 [IU]/h via INTRAVENOUS
  Administered 2024-01-23: 1200 [IU]/h via INTRAVENOUS
  Filled 2024-01-22 (×3): qty 250

## 2024-01-22 NOTE — H&P (Signed)
 History and Physical    Jessica Banks FMW:[id number] DOB: 11-30-1981 DOA: 01/22/2024  Patient coming from: Home.  Chief Complaint: Abdominal pain.  HPI: Jessica Banks is a 44 y.o. female with history of recent surgery for internal hernia on December 05, 2023 and following which patient was admitted for pulmonary embolism with prior history of Roux-en-Y and gastric sleeve surgery with history of ADHD, depression has been experiencing episodic severe abdominal pain for the last 1 month.  Had come to the ER on January 16, 2024 and CT abdomen showed lesion in the pancreatic tail.  Patient had followed up with general surgery and plan was to get a esophagram and MRI of the abdomen.  Due to worsening pain patient presents to the ER.  Pain is mostly in the left upper quadrant denies any associated nausea vomiting or diarrhea fever chills chest pain.  ED Course: In the ER labs show AST of 90 ALT of 47 lipase was 23 blood glucose 219 potassium 3.2 lactic acid was normal pregnancy and was negative.  ER physician discussed with on-call general surgeon Dr. Polly who will be seeing patient in consult for further recommendation.  Acute abdominal series is pending.  Review of Systems: As per HPI, rest all negative.   Past Medical History:  Diagnosis Date   Abnormal Pap smear    CIN I   Anemia    Anxiety    Depression    off meds now, ok now   Enlarged thyroid  gland    nromal function   GERD (gastroesophageal reflux disease)    Migraine    Obesity    Ovarian cyst    Vaginal Pap smear, abnormal    ok now    Past Surgical History:  Procedure Laterality Date   GASTRIC BYPASS  08/14/2022   Revision of sleeve/hiatal hernia repair   LAPAROSCOPIC GASTRIC SLEEVE RESECTION  2017   LAPAROSCOPY N/A 12/05/2023   Procedure: LAPAROSCOPY DIAGNOSTIC, CHOLECYSTECOMY, INTERNAL HERNIA REPAIR;  Surgeon: Dasie Leonor CROME, MD;  Location: [facility];  Service: General;  Laterality: N/A;     reports that  she quit smoking about 10 years ago. Her smoking use included cigarettes. She started smoking about 14 years ago. She has never used smokeless tobacco. She reports that she does not currently use alcohol. She reports that she does not use drugs.  Allergies  Allergen Reactions   Novocain [Procaine] Anaphylaxis   Nsaids Other (See Comments)    H/o gastric bypass = avoid NSAIDs   Povidone Iodine Rash    Rash    Family History  Problem Relation Age of Onset   Thyroid  disease Mother        s/p thyroidectomy   Arthritis Mother    Leukemia Maternal Grandmother    Stroke Maternal Grandmother    Cancer Maternal Grandfather        lung   Heart disease Paternal Grandmother    COPD Paternal Grandfather    Hypertension Maternal Uncle    Diabetes Maternal Uncle     Prior to Admission medications   Medication Sig Start Date End Date Taking? Authorizing Provider  acetaminophen  (TYLENOL ) 500 MG tablet Take 2 tablets (1,000 mg total) by mouth every 6 (six) hours as needed. 12/07/23   Tammy Sor, PA-C  amphetamine -dextroamphetamine  (ADDERALL  XR) 30 MG 24 hr capsule Take 30 mg by mouth daily.    [provider]  apixaban  (ELIQUIS ) 5 MG TABS tablet Take 2 tablets (10 mg total) by mouth 2 (  two) times daily for 6 days, THEN 1 tablet (5 mg total) 2 (two) times daily. 12/16/23 01/21/24  [name], MD  apixaban  (ELIQUIS ) 5 MG TABS tablet Take 1 tablet (5 mg total) by mouth 2 (two) times daily. 01/21/24 04/20/24  Ghimire, Kuber, MD  butalbital -acetaminophen -caffeine  (FIORICET ) 50-325-40 MG tablet Take 1 tablet by mouth 2 (two) times daily as needed. Patient taking differently: Take 2 tablets by mouth daily as needed for headache or migraine. 11/19/23   Penumalli, Vikram R, MD  cyclobenzaprine  (FLEXERIL ) 10 MG tablet Take 1 tablet (10 mg total) by mouth daily as needed. Patient taking differently: Take 10 mg by mouth daily as needed for muscle spasms. 11/19/23   Penumalli, Eduard SAUNDERS, MD  DULoxetine   (CYMBALTA ) 60 MG capsule Take 1 capsule (60 mg total) by mouth daily. Patient taking differently: Take 60 mg by mouth at bedtime. 11/19/23   Penumalli, Vikram R, MD  Ferrous Sulfate  (IRON  PO) Take 1 tablet by mouth at bedtime.    [provider]  Galcanezumab -gnlm (EMGALITY ) 120 MG/ML SOAJ Inject 1 Pen into the skin every 30 (thirty) days. 11/19/23   Penumalli, Vikram R, MD  Melatonin 10 MG CAPS Take 10 mg by mouth at bedtime.    [provider]  Multiple Vitamin (MULTIVITAMIN) tablet Take 1 tablet by mouth at bedtime.    [provider]  oxyCODONE -acetaminophen  (PERCOCET/ROXICET) 5-325 MG tablet Take 1 tablet by mouth every 8 (eight) hours as needed for severe pain (pain score 7-10) or moderate pain (pain score 4-6). 12/16/23   Ghimire, Kuber, MD  Probiotic Product (FORTIFY PROBIOTIC WOMENS PO) Take 1 capsule by mouth at bedtime.    [provider]  Rimegepant Sulfate (NURTEC) 75 MG TBDP Take 1 tablet (75 mg total) by mouth daily as needed. 11/19/23   Penumalli, Vikram R, MD  temazepam  (RESTORIL ) 15 MG capsule TAKE 1 CAPSULE BY MOUTH AT BEDTIME AS NEEDED FOR SLEEP Patient taking differently: Take 15 mg by mouth at bedtime. 10/17/23   Jarold Medici, MD  VITAMIN D  PO Take 1 capsule by mouth at bedtime.    [provider]    Physical Exam: Constitutional: Moderately built and nourished. Vitals:   01/22/24 0230 01/22/24 0245 01/22/24 0400  BP: (!) 168/100 (!) 177/98 (!) 159/109  Pulse: 86 60 95  Resp: (!) 24 13 20   SpO2: 100% 97% 97%   Eyes: Anicteric no pallor. ENMT: No discharge from the ears eyes nose or mouth. Neck: No mass felt.  No neck rigidity. Respiratory: No rhonchi or crepitations. Cardiovascular: S1-S2 heard. Abdomen: Mild epigastric and left upper quadrant tenderness.  No guarding or rigidity. Musculoskeletal: No edema. Skin: No rash. Neurologic: Alert awake oriented to time place and person.  Moves all extremities. Psychiatric:  Appears normal.  Normal affect.   Labs on Admission: I have personally reviewed following labs and imaging studies  CBC: Recent Labs  Lab 01/16/24 0014 01/22/24 0225  WBC 6.8 7.6  HGB 11.9* 12.5  HCT 37.5 40.9  MCV 91.7 94.7  PLT 312 334   Basic Metabolic Panel: Recent Labs  Lab 01/16/24 0014 01/22/24 0345  NA 136 138  K 3.5 3.2*  CL 105 103  CO2 22 23  GLUCOSE 99 219*  BUN 8 13  CREATININE 0.52 0.46  CALCIUM  8.9 9.1   GFR: Estimated Creatinine Clearance: 88.4 mL/min (by C-G formula based on SCr of 0.46 mg/dL). Liver Function Tests: Recent Labs  Lab 01/16/24 0014 01/22/24 0345  AST 37 90*  ALT 38 47*  ALKPHOS 148* 181*  BILITOT 0.3 0.6  PROT 7.0 7.6  ALBUMIN  3.6 3.9   Recent Labs  Lab 01/16/24 0014 01/22/24 0345  LIPASE 22 23   No results for input(s): AMMONIA in the last 168 hours. Coagulation Profile: No results for input(s): INR, PROTIME in the last 168 hours. Cardiac Enzymes: No results for input(s): CKTOTAL, CKMB, CKMBINDEX, TROPONINI in the last 168 hours. BNP (last 3 results) No results for input(s): PROBNP in the last 8760 hours. HbA1C: No results for input(s): HGBA1C in the last 72 hours. CBG: No results for input(s): GLUCAP in the last 168 hours. Lipid Profile: No results for input(s): CHOL, HDL, LDLCALC, TRIG, CHOLHDL, LDLDIRECT in the last 72 hours. Thyroid  Function Tests: No results for input(s): TSH, T4TOTAL, FREET4, T3FREE, THYROIDAB in the last 72 hours. Anemia Panel: No results for input(s): VITAMINB12, FOLATE, FERRITIN, TIBC, IRON , RETICCTPCT in the last 72 hours. Urine analysis:    Component Value Date/Time   COLORURINE YELLOW 01/16/2024 0013   APPEARANCEUR CLEAR 01/16/2024 0013   LABSPEC 1.025 01/16/2024 0013   PHURINE 5.5 01/16/2024 0013   GLUCOSEU NEGATIVE 01/16/2024 0013   HGBUR NEGATIVE 01/16/2024 0013   BILIRUBINUR NEGATIVE 01/16/2024 0013   BILIRUBINUR NEGATIVE  12/24/2023 1704   KETONESUR NEGATIVE 01/16/2024 0013   PROTEINUR NEGATIVE 01/16/2024 0013   UROBILINOGEN 0.2 12/24/2023 1704   NITRITE NEGATIVE 01/16/2024 0013   LEUKOCYTESUR NEGATIVE 01/16/2024 0013   Sepsis Labs: @LABRCNTIP (procalcitonin:4,lacticidven:4) )No results found for this or any previous visit (from the past 240 hours).   Radiological Exams on Admission: No results found.   Assessment/Plan Principal Problem:   Abdominal pain Active Problems:   Anxiety   History of bariatric surgery   History of pulmonary embolism   ADHD    Intractable abdominal pain -   CT scan done last week showed abnormal lesion in the tail of the pancreas for which MRCP has been ordered.  Acute abdominal series is pending.  General surgery has been consulted for further recommendations.  N.p.o. IV fluids pain relief medications. History of recent PE takes Eliquis .  Since patient is n.p.o. and awaiting MRCP  will keep patient on heparin  infusion for now. Elevated blood pressure readings follow blood pressure trends.  As needed IV hydralazine . Hyperglycemia check hemoglobin A1c. Hypokalemia replace recheck. History of ADHD takes Adderall . History of depression takes Cymbalta .  Since patient has intractable abdominal pain and he will need further assessment and management will need more than 2 midnight stay.   DVT prophylaxis: Heparin  infusion. Code Status: Full code. Family Communication: Discussed with patient. Disposition Plan: Medical floor. Consults called: General Surgery. Admission status: Observation.

## 2024-01-22 NOTE — Progress Notes (Signed)
 PHARMACY - ANTICOAGULATION CONSULT NOTE  Pharmacy Consult for heparin  Indication: pulmonary embolus  Allergies  Allergen Reactions   Novocain [Procaine] Anaphylaxis   Nsaids Other (See Comments)    H/o gastric bypass = avoid NSAIDs   Povidone Iodine Rash    Rash    Patient Measurements:   Heparin  Dosing Weight: 83kg  Vital Signs: BP: 159/109 (02/04 0400) Pulse Rate: 95 (02/04 0400)  Labs: Recent Labs    01/22/24 0225 01/22/24 0345  HGB 12.5  --   HCT 40.9  --   PLT 334  --   CREATININE  --  0.46    Estimated Creatinine Clearance: 88.4 mL/min (by C-G formula based on SCr of 0.46 mg/dL).   Medical History: Past Medical History:  Diagnosis Date   Abnormal Pap smear    CIN I   Anemia    Anxiety    Depression    off meds now, ok now   Enlarged thyroid  gland    nromal function   GERD (gastroesophageal reflux disease)    Migraine    Obesity    Ovarian cyst    Vaginal Pap smear, abnormal    ok now     Assessment: 43 y/o female with hx of migraine, obesity, anxiety, and GERD presents to the ED for LEFT upper abdominal pain. Pain is recurrent and has been since her internal hernia surgery in December. Subsequently readmitted for postoperative ileus and pulmonary embolus. Pharmacy to dose heparin  drip. LD of eliquis  2/3 evening   CBC WNL  Goal of Therapy:  Heparin  level 0.3-0.7 units/ml aPTT 66-102 seconds Monitor platelets by anticoagulation protocol: Yes   Plan:  No bolus start heparin  drip at 1300 units/hr Aptt level in 6 hours Daily CBC and heparin  level  Leeroy Mace RPh 01/22/2024, 6:03 AM

## 2024-01-22 NOTE — ED Notes (Signed)
 ED Provider at bedside.

## 2024-01-22 NOTE — ED Notes (Signed)
[facility] has been called and report given to receiving RN.

## 2024-01-22 NOTE — Plan of Care (Signed)

## 2024-01-22 NOTE — Consult Note (Signed)
 Jessica Banks Sep 28, 1981  [id number].    Requesting MD: Erle Chief Complaint/Reason for Consult: Abdominal pain  HPI:  Jessica Banks is a 43 y.o. 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 She underwent laparoscopic reduction and repair of an internal hernia and cholecystectomy on 12/05/23. She has had significant abdominal pain postoperatively, which was not present prior to surgery. She has presented to the ED and outpatient clinics for evaluation multiple times and most recently underwent a CT on 1/29 that identified a possible small pancreatic tall lesion and small HH but was otherwise unremarkable.  She presented to the ED again today with significant abdominal pain, similar to her past issues. She reports that this time she was sleeping when she awoke with severe LUQ pain that was then followed by some nausea. She took oxy and flexeril  but got no relief so decided to call EMS. She received several doses of fentanyl  en route to the hospital and since presenting has received additional fentanyl  and dilaudid . She now reports minor relief of her pain but it has still not resolved. She denies current nausea. She is AF and HDS.  WBC 13.  Lipase WNL.    ROS: Review of Systems  Constitutional: Negative.   HENT: Negative.    Eyes: Negative.   Respiratory: Negative.    Cardiovascular: Negative.   Gastrointestinal:  Positive for abdominal pain and nausea.  Genitourinary: Negative.   Musculoskeletal: Negative.   Skin: Negative.   Neurological: Negative.   Endo/Heme/Allergies: Negative.   Psychiatric/Behavioral: Negative.      Family History  Problem Relation Age of Onset   Thyroid  disease Mother        s/p thyroidectomy   Arthritis Mother    Leukemia Maternal Grandmother    Stroke Maternal  Grandmother    Cancer Maternal Grandfather        lung   Heart disease Paternal Grandmother    COPD Paternal Grandfather    Hypertension Maternal Uncle    Diabetes Maternal Uncle     Past Medical History:  Diagnosis Date   Abnormal Pap smear    CIN I   Anemia    Anxiety    Depression    off meds now, ok now   Enlarged thyroid  gland    nromal function   GERD (gastroesophageal reflux disease)    Migraine    Obesity    Ovarian cyst    Vaginal Pap smear, abnormal    ok now    Past Surgical History:  Procedure Laterality Date   GASTRIC BYPASS  08/14/2022   Revision of sleeve/hiatal hernia repair   LAPAROSCOPIC GASTRIC SLEEVE RESECTION  2017   LAPAROSCOPY N/A 12/05/2023   Procedure: LAPAROSCOPY DIAGNOSTIC, CHOLECYSTECOMY, INTERNAL HERNIA REPAIR;  Surgeon: Dasie Leonor CROME, MD;  Location: [facility];  Service: General;  Laterality: N/A;    Social History:  reports that she quit smoking about 10 years ago. Her smoking use included cigarettes. She started smoking about 14 years ago. She has never used smokeless tobacco. She reports that she does not currently use alcohol. She reports that she does not use drugs.  Allergies:  Allergies  Allergen Reactions   Novocain [Procaine] Anaphylaxis   Nsaids Other (See Comments)    H/o gastric bypass = avoid NSAIDs   Povidone Iodine Rash    Rash    (  Not in a hospital admission)   Physical Exam: Blood pressure (!) 159/109, pulse 95, temperature (!) 97.5 F (36.4 C), temperature source Oral, resp. rate 20, SpO2 97%. Gen: female, NAD Abd: soft, moderate distention, TTP in the LUQ, + guarding, no rebound, no peritoneal signs  Results for orders placed or performed during the hospital encounter of 01/22/24 (from the past 48 hours)  CBC     Status: None   Collection Time: 01/22/24  2:25 AM  Result Value Ref Range   WBC 7.6 4.0 - 10.5 K/uL   RBC 4.32 3.87 - 5.11 MIL/uL   Hemoglobin 12.5 12.0 - 15.0 g/dL   HCT 59.0 63.9 - 53.9 %   MCV  94.7 80.0 - 100.0 fL   MCH 28.9 26.0 - 34.0 pg   MCHC 30.6 30.0 - 36.0 g/dL   RDW 86.3 88.4 - 84.4 %   Platelets 334 150 - 400 K/uL   nRBC 0.0 0.0 - 0.2 %    Comment: Performed at [facility], [address], [place], [place] [address]  hCG, serum, qualitative     Status: None   Collection Time: 01/22/24  2:25 AM  Result Value Ref Range   Preg, Serum NEGATIVE NEGATIVE    Comment:        THE SENSITIVITY OF THIS METHODOLOGY IS >10 mIU/mL. Performed at [facility], [address], [place], [place] [address]   Lactic acid, plasma     Status: None   Collection Time: 01/22/24  2:26 AM  Result Value Ref Range   Lactic Acid, Venous 1.7 0.5 - 1.9 mmol/L    Comment: Performed at [facility], [address] [place], [place] [address]  Comprehensive metabolic panel     Status: Abnormal   Collection Time: 01/22/24  3:45 AM  Result Value Ref Range   Sodium 138 135 - 145 mmol/L   Potassium 3.2 (L) 3.5 - 5.1 mmol/L   Chloride 103 98 - 111 mmol/L   CO2 23 22 - 32 mmol/L   Glucose, Bld 219 (H) 70 - 99 mg/dL    Comment: Glucose reference range applies only to samples taken after fasting for at least 8 hours.   BUN 13 6 - 20 mg/dL   Creatinine, Ser 9.53 0.44 - 1.00 mg/dL   Calcium  9.1 8.9 - 10.3 mg/dL   Total Protein 7.6 6.5 - 8.1 g/dL   Albumin  3.9 3.5 - 5.0 g/dL   AST 90 (H) 15 - 41 U/L   ALT 47 (H) 0 - 44 U/L   Alkaline Phosphatase 181 (H) 38 - 126 U/L   Total Bilirubin 0.6 0.0 - 1.2 mg/dL   GFR, Estimated >39 >39 mL/min    Comment: (NOTE) Calculated using the CKD-EPI Creatinine Equation (2021)    Anion gap 12 5 - 15    Comment: Performed at [facility], [address] [place], [place] [address]  Lipase, blood     Status: None   Collection Time: 01/22/24  3:45 AM  Result Value Ref Range   Lipase 23 11 - 51 U/L    Comment: Performed at [facility], [address], [place], [place]  [address]  Hepatic function panel     Status: Abnormal   Collection Time: 01/22/24  5:48 AM  Result Value Ref Range   Total Protein 7.5 6.5 - 8.1 g/dL   Albumin  3.6 3.5 - 5.0 g/dL   AST 82 (H) 15 -  41 U/L   ALT 42 0 - 44 U/L   Alkaline Phosphatase 186 (H) 38 - 126 U/L   Total Bilirubin 0.6 0.0 - 1.2 mg/dL   Bilirubin, Direct 0.2 0.0 - 0.2 mg/dL   Indirect Bilirubin 0.4 0.3 - 0.9 mg/dL    Comment: Performed at [facility], [address], [place], [place] [address]  Basic metabolic panel     Status: Abnormal   Collection Time: 01/22/24  5:48 AM  Result Value Ref Range   Sodium 137 135 - 145 mmol/L   Potassium 3.8 3.5 - 5.1 mmol/L   Chloride 104 98 - 111 mmol/L   CO2 22 22 - 32 mmol/L   Glucose, Bld 194 (H) 70 - 99 mg/dL    Comment: Glucose reference range applies only to samples taken after fasting for at least 8 hours.   BUN 13 6 - 20 mg/dL   Creatinine, Ser 9.30 0.44 - 1.00 mg/dL   Calcium  9.0 8.9 - 10.3 mg/dL   GFR, Estimated >39 >39 mL/min    Comment: (NOTE) Calculated using the CKD-EPI Creatinine Equation (2021)    Anion gap 11 5 - 15    Comment: Performed at [facility], [address], [place], [place] [address]  CBC with Differential/Platelet     Status: Abnormal   Collection Time: 01/22/24  5:48 AM  Result Value Ref Range   WBC 12.8 (H) 4.0 - 10.5 K/uL   RBC 4.80 3.87 - 5.11 MIL/uL   Hemoglobin 13.7 12.0 - 15.0 g/dL   HCT 54.8 63.9 - 53.9 %   MCV 94.0 80.0 - 100.0 fL   MCH 28.5 26.0 - 34.0 pg   MCHC 30.4 30.0 - 36.0 g/dL   RDW 86.2 88.4 - 84.4 %   Platelets 382 150 - 400 K/uL   nRBC 0.0 0.0 - 0.2 %   Neutrophils Relative % 81 %   Neutro Abs 10.2 (H) 1.7 - 7.7 K/uL   Lymphocytes Relative 16 %   Lymphs Abs 2.1 0.7 - 4.0 K/uL   Monocytes Relative 3 %   Monocytes Absolute 0.4 0.1 - 1.0 K/uL   Eosinophils Relative 0 %   Eosinophils Absolute 0.1 0.0 - 0.5 K/uL   Basophils Relative 0 %   Basophils Absolute 0.0 0.0 - 0.1 K/uL    Immature Granulocytes 0 %   Abs Immature Granulocytes 0.05 0.00 - 0.07 K/uL    Comment: Performed at [facility], [address], [place], [place] [address]   No results found.  Assessment/Plan NEELA ZECCA is a 43 y.o. 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 She underwent laparoscopic reduction and repair of an internal hernia and cholecystectomy on 12/05/23 who presents with ongoing severe LUQ pain  - Agree with admission to medicine for symptom management and ongoing workup - Will order UGI. Can hold on MRCP. The lesion at the tail of her pancreas is unlikely to be a source of her complaints - Will likely need to involve GI if the UGI is unremarkable  - NPO for now  Jessica Banks Surgery 01/22/2024, 6:53 AM Please see Amion for pager number during day hours 7:00am-4:30pm or 7:00am -11:30am on weekends

## 2024-01-22 NOTE — ED Provider Notes (Signed)
 [place] EMERGENCY DEPARTMENT AT [facility] Provider Note   CSN: [id number] Arrival date & time: 01/22/24  9843     History  Chief Complaint  Patient presents with   Abdominal Pain    Jessica Banks is a 43 y.o. female.  43 y/o female with hx of migraine, obesity, anxiety, and GERD presents to the ED for LEFT upper abdominal pain. Pain is recurrent and has been since her internal hernia surgery in December. Subsequently readmitted for postoperative ileus and pulmonary embolus. Jessica Banks is on chronic anticoagulation for this. Has been experiencing intermittent pain in the LUQ with unknown inciting symptoms. Pain will radiate to the left flank. Jessica Banks has nausea and dry heaves, but no emesis. Did have a normal BM earlier today. Becomes diaphoretic due to the pain, but no documented fevers. Tried oxycodone  today without relief. Was seen in the ED for these complaints on 01/15/24 with reassuring CT scan. Had f/u with her surgeon on 01/02/24 and 01/16/24 for evaluation of this postoperative pain as well. Is scheduled for a UGI study and MRCP on 01/24/24 to try and further investigate cause of symptoms.  Complex abdominal SHx notable for gastric sleeve resection in 2017, gastric sleeve revision and hiatal hernia repair in 2023, and internal hernia repair/cholecystectomy 12/05/23.  The history is provided by the patient. No language interpreter was used.  Abdominal Pain      Home Medications Prior to Admission medications   Medication Sig Start Date End Date Taking? Authorizing Provider  acetaminophen  (TYLENOL ) 500 MG tablet Take 2 tablets (1,000 mg total) by mouth every 6 (six) hours as needed. 12/07/23   Tammy Sor, PA-C  amphetamine -dextroamphetamine  (ADDERALL  XR) 30 MG 24 hr capsule Take 30 mg by mouth daily.    [provider]  apixaban  (ELIQUIS ) 5 MG TABS tablet Take 2 tablets (10 mg total) by mouth 2 (two) times daily for 6 days, THEN 1 tablet (5 mg total) 2 (two)  times daily. 12/16/23 01/21/24  [name], MD  apixaban  (ELIQUIS ) 5 MG TABS tablet Take 1 tablet (5 mg total) by mouth 2 (two) times daily. 01/21/24 04/20/24  Ghimire, Kuber, MD  butalbital -acetaminophen -caffeine  (FIORICET ) 50-325-40 MG tablet Take 1 tablet by mouth 2 (two) times daily as needed. Patient taking differently: Take 2 tablets by mouth daily as needed for headache or migraine. 11/19/23   Penumalli, Vikram R, MD  cyclobenzaprine  (FLEXERIL ) 10 MG tablet Take 1 tablet (10 mg total) by mouth daily as needed. Patient taking differently: Take 10 mg by mouth daily as needed for muscle spasms. 11/19/23   Penumalli, Eduard SAUNDERS, MD  DULoxetine  (CYMBALTA ) 60 MG capsule Take 1 capsule (60 mg total) by mouth daily. Patient taking differently: Take 60 mg by mouth at bedtime. 11/19/23   Penumalli, Vikram R, MD  Ferrous Sulfate  (IRON  PO) Take 1 tablet by mouth at bedtime.    [provider]  Galcanezumab -gnlm (EMGALITY ) 120 MG/ML SOAJ Inject 1 Pen into the skin every 30 (thirty) days. 11/19/23   Penumalli, Vikram R, MD  Melatonin 10 MG CAPS Take 10 mg by mouth at bedtime.    [provider]  Multiple Vitamin (MULTIVITAMIN) tablet Take 1 tablet by mouth at bedtime.    [provider]  oxyCODONE -acetaminophen  (PERCOCET/ROXICET) 5-325 MG tablet Take 1 tablet by mouth every 8 (eight) hours as needed for severe pain (pain score 7-10) or moderate pain (pain score 4-6). 12/16/23   Ghimire, Kuber, MD  Probiotic Product (FORTIFY PROBIOTIC WOMENS PO) Take 1  capsule by mouth at bedtime.    [provider]  Rimegepant Sulfate (NURTEC) 75 MG TBDP Take 1 tablet (75 mg total) by mouth daily as needed. 11/19/23   Penumalli, Vikram R, MD  temazepam  (RESTORIL ) 15 MG capsule TAKE 1 CAPSULE BY MOUTH AT BEDTIME AS NEEDED FOR SLEEP Patient taking differently: Take 15 mg by mouth at bedtime. 10/17/23   Jarold Medici, MD  VITAMIN D  PO Take 1 capsule by mouth at bedtime.    [provider]       Allergies    Novocain [procaine], Nsaids, and Povidone iodine    Review of Systems   Review of Systems  Gastrointestinal:  Positive for abdominal pain.  Ten systems reviewed and are negative for acute change, except as noted in the HPI.    Physical Exam Updated Vital Signs BP (!) 159/109   Pulse 95   Resp 20   SpO2 97%   Physical Exam Vitals and nursing note reviewed.  Constitutional:      General: Jessica Banks is not in acute distress.    Appearance: Jessica Banks is well-developed. Jessica Banks is not diaphoretic.     Comments: Intermittent episodes of wailing and rocking in the bed. Appears uncomfortable, but nontoxic.  HENT:     Head: Normocephalic and atraumatic.  Eyes:     General: No scleral icterus.    Conjunctiva/sclera: Conjunctivae normal.  Cardiovascular:     Rate and Rhythm: Normal rate and regular rhythm.     Pulses: Normal pulses.  Pulmonary:     Effort: Pulmonary effort is normal. No respiratory distress.     Comments: Respirations even and unlabored Abdominal:     Palpations: Abdomen is soft. There is no mass.     Tenderness: There is abdominal tenderness. There is guarding (voluntary).     Comments: Abdomen soft, obese. Generalized TTP, subjectively worse in the upper abdomen. Voluntary guarding present without palpable masses, peritoneal signs. Well healed abdominal surgical incision without overlying skin changes.  Musculoskeletal:        General: Normal range of motion.     Cervical back: Normal range of motion.  Skin:    General: Skin is warm and dry.     Coloration: Skin is not pale.     Findings: No erythema or rash.  Neurological:     Mental Status: Jessica Banks is alert and oriented to person, place, and time.     Coordination: Coordination normal.     Comments: Moving all extremities spontaneously.  Psychiatric:        Behavior: Behavior normal.     ED Results / Procedures / Treatments   Labs (all labs ordered are listed, but only abnormal results are  displayed) Labs Reviewed  COMPREHENSIVE METABOLIC PANEL - Abnormal; Notable for the following components:      Result Value   Potassium 3.2 (*)    Glucose, Bld 219 (*)    AST 90 (*)    ALT 47 (*)    Alkaline Phosphatase 181 (*)    All other components within normal limits  CBC  HCG, SERUM, QUALITATIVE  LACTIC ACID, PLASMA  LIPASE, BLOOD  URINALYSIS, ROUTINE W REFLEX MICROSCOPIC    EKG None  Radiology No results found.  Procedures Procedures    Medications Ordered in ED Medications  ondansetron  (ZOFRAN ) injection 4 mg (has no administration in time range)  fentaNYL  (SUBLIMAZE ) injection 50 mcg (50 mcg Intravenous Given 01/22/24 0242)  HYDROmorphone  (DILAUDID ) injection 1 mg (1 mg Intravenous Given 01/22/24 0253)  metoCLOPramide  (REGLAN ) injection 10 mg (10 mg Intravenous Given 01/22/24 0251)  dicyclomine  (BENTYL ) injection 20 mg (20 mg Intramuscular Given 01/22/24 0318)  ketorolac  (TORADOL ) 15 MG/ML injection 15 mg (15 mg Intravenous Given 01/22/24 0317)    ED Course/ Medical Decision Making/ A&P Clinical Course as of 01/22/24 0447  Tue Jan 22, 2024  0324 Patient presenting for episode of recurrent, ongoing left upper quadrant abdominal pain.  Jessica Banks has been having issues with these episodes since internal hernia repair and cholecystectomy in mid December.  Patient intermittently uncomfortable, wailing in pain and rocking back and forth in the chair.  Jessica Banks is afebrile.  Hypertensive blood pressure likely secondary to pain response.  Thus far, laboratory workup has been stable.  Jessica Banks has no leukocytosis or fever to suggest infectious etiology.  Lactic acid level is normal which is reassuring and not suggestive of ischemic process.  Pregnancy is negative.  Pending metabolic panel and lipase as well as urinalysis.  Anticipate surgical input to discuss symptom management and disposition.  Do not see indication for emergent abdominal CT at this time as patient has had 2 postoperative  abdominal CTs since her surgery that have been generally reassuring/stable from a postoperative perspective. Jessica Banks had a normal BM earlier today and no ongoing vomiting. Low current suspicion for pSBO/SBO. [KH]  0416 Pain presently 6/10 down from 10/10 on arrival. Does have some minimal LFT changes which were not present on prior work ups. AST 90 (up from 37) and ALT 47 (up from 38). Alk phos rising from 96 to 181 today. Tbili remains normal. [KH]  9571 Spoke with Dr. Polly of general surgery who feels admission for pain control is reasonable. OK to hold abdominal imaging pending surgical consultation in AM. Will consult hospitalist for admission. [KH]  [phone] Case discussed with Dr. Franky of [facility] who will admit. [KH]    Clinical Course User Index [KH] Keith Sor, PA-C                                 Medical Decision Making Amount and/or Complexity of Data Reviewed Labs: ordered.  Risk Prescription drug management. Decision regarding hospitalization.   This patient presents to the ED for concern of LUQ abdominal pain, this involves an extensive number of treatment options, and is a complaint that carries with it a high risk of complications and morbidity.  The differential diagnosis includes PUD/gastritis vs choledocholithiasis vs pain 2/2 surgical adhesions vs pSBO/SBO vs mesenteric ischemia vs intraabdominal infection.   Co morbidities that complicate the patient evaluation  Migraine Obesity GERD PE, on chronic anticoagulation Gastric bypass   Additional history obtained:  Additional history obtained from EMS personnel External records from outside source obtained and reviewed including CT from 01/16/24 which noted stable postoperative findings as well as persistent fluid lesion on the pancreatic tail favored to reflect a simple cyst.   Lab Tests:  I Ordered, and personally interpreted labs.  The pertinent results include:  K 3.2, Glucose 219, AST 90, ALT 47, Alk Phos 181.     Cardiac Monitoring:  The patient was maintained on a cardiac monitor.  I personally viewed and interpreted the cardiac monitored which showed an underlying rhythm of: NSR   Medicines ordered and prescription drug management:  I ordered medication including Dilaudid , Bentyl , and Toradol  for pain  Reevaluation of the patient after these medicines showed that the patient  mildly improved I have reviewed the patients  home medicines and have made adjustments as needed   Test Considered:  CT abd/pelvis - felt to be low yield given reassuring past CT imaging, stable labs, chronic nature of pain.   Consultations Obtained:  I requested consultation with general surgery, Dr. Polly, and discussed lab and imaging findings - their service will assess the patient in consultation this AM.   Problem List / ED Course:  As above   Reevaluation:  After the interventions noted above, I reevaluated the patient and found that they have :stayed the same   Social Determinants of Health:  Lives with spouse   Dispostion:  After consideration of the diagnostic results and the patients response to treatment, I feel that the patent would benefit from admission for pain control and further investigation into cause of symptoms. Plan for medical admission to hospitalist service. General surgery to consult in AM.          Final Clinical Impression(s) / ED Diagnoses Final diagnoses:  Pain of upper abdomen  Transaminitis    Rx / DC Orders ED Discharge Orders     None         Keith Sor, PA-C 01/22/24 0453    Raford Lenis, MD 01/22/24 [phone]

## 2024-01-22 NOTE — Progress Notes (Signed)
 [facility] PROGRESS NOTE    Progress Note  Jessica Banks  FMW:[id number] DOB: [birth year] DOA: 01/22/2024 PCP: Jarold Medici, MD     Brief Narrative:   Jessica Banks is an 43 y.o. female past medical history or recent surgery for internal hernia in December 2024, following this she presented and admitted for PE on Eliquis , with a history of Roux-en-Y and gastric sleeve surgery, history of cholecystectomy on December 2024 history of ADHD comes in for abdominal pain that has been going on for a month, had a visit in the ED on 01/16/2024 CT scan of the abdomen pelvis showed lesion in the pancreatic tail.  Was recommended to follow-up with general surgery, due to worsening abdominal pain came into the hospital mostly left upper quadrant denies nausea vomiting diarrhea fever and chills.  In the ED was found to elevated AST and Alt 90/47 bilirubin 0.6 alkaline phosphatase of 181, white blood cell count of 13, and a potassium of 3.2.  General surgery was consulted  Assessment/Plan:   New left flank intractable Abdominal pain: CT scan of the abdomen pelvis showed lesion in the pancreatic tail measuring 1 cm, small volume ascites. She relates she had a normal bowel movement the day prior to admission. Abdominal series showed no evidence of bowel obstruction free air.  Lipase is unremarkable Currently n.p.o. and IV fluids. General surgery has been consulted, agree with x-ray and to hold on an MRCP.  The lesion of the pancreas is unlikely to be the source of her new complaint. They recommended an UGI. Currently on narcotics for analgesics. Was placed on IV heparin  due to her history of PE. She denies any NSAID use, pregnancy test is negative.  Elevated LFTs: Elevated alkaline phosphatase, mildly elevated LFTs unremarkable bilirubin. Will discuss with GI the need of MRCP  History of PE on Eliquis : Currently n.p.o. transition to IV heparin  infusion acute procedures  needed.  Elevated blood pressure without a diagnosis of essential hypertension: Continue hydralazine  IV as needed.  Hyperglycemia: Likely reactive A1c is pending.  Hypokalemia: Likely due to vomiting repleted now improved.  History of DDD: Continue Adderall .  History of depression:  Continue Cymbalta .  DVT prophylaxis: scd Family Communication:mother Status is: Observation The patient remains OBS appropriate and will d/c before 2 midnights.    Code Status:     Code Status Orders  (From admission, onward)           Start     Ordered   01/22/24 0529  Full code  Continuous       Question:  By:  Answer:  Consent: discussion documented in EHR   01/22/24 0530           Code Status History     Date Active Date Inactive Code Status Order ID Comments User Context   12/14/2023 1143 12/16/2023 1555 Full Code [id number]  Celinda Alm Lot, MD Inpatient   12/05/2023 0829 12/07/2023 1617 Full Code [id number]  Tammy Sor, PA-C ED   12/06/2020 1239 12/08/2020 1627 Full Code [id number]  Letha Renshaw, CNM Inpatient   12/06/2020 0832 12/06/2020 1227 Full Code [id number]  Letha Renshaw, CNM Inpatient   10/17/2017 2156 10/19/2017 1343 Full Code [id number]  Rutherford Gain, MD Inpatient   10/17/2017 0927 10/17/2017 2156 Full Code [id number]  Megan Burnadette ORN, RN Inpatient         IV Access:   Peripheral IV   Procedures and diagnostic studies:   DG ABD ACUTE 2+V W 1V CHEST Result Date:  01/22/2024 CLINICAL DATA:  [id number]. Abdominal pain. Severe diffuse abdominal pain.  Recent small-bowel obstruction. EXAM: DG ABDOMEN ACUTE WITH 1 VIEW CHEST COMPARISON:  Recent CT with IV contrast 01/16/2024 FINDINGS: There is no evidence of dilated bowel loops or free intraperitoneal air. There is mild fecal stasis. Postsurgical changes of prior gastric bypass and cholecystectomy are again shown. No radiopaque calculi or other significant radiographic abnormality is seen. Heart size  and mediastinal contours are within normal limits. Both lungs are clear. Thoracic cage is intact with mild thoracic dextroscoliosis. IMPRESSION: 1. No evidence of bowel obstruction or free air. 2. Mild fecal stasis. 3. No acute radiographic chest findings. Electronically Signed   By: Francis Quam M.D.   On: 01/22/2024 06:52     Medical Consultants:   None.   Subjective:    Jessica Banks continues to have abdominal pain mainly left flank pain that radiates to her back  Objective:    Vitals:   01/22/24 0230 01/22/24 0245 01/22/24 0400 01/22/24 0630  BP: (!) 168/100 (!) 177/98 (!) 159/109   Pulse: 86 60 95   Resp: (!) 24 13 20    Temp:    (!) 97.5 F (36.4 C)  TempSrc:    Oral  SpO2: 100% 97% 97%    SpO2: 97 %   Intake/Output Summary (Last 24 hours) at 01/22/2024 0709 Last data filed at 01/22/2024 9341 Gross per 24 hour  Intake 49.75 ml  Output --  Net 49.75 ml   There were no vitals filed for this visit.  Exam: General exam: In no acute distress. Respiratory system: Good air movement and clear to auscultation. Cardiovascular system: S1 & S2 heard, RRR. No JVD. Gastrointestinal system: Abdomen is nondistended, soft and left upper quadrant tenderness and some epigastric Central nervous system: Alert and oriented. No focal neurological deficits. Extremities: No pedal edema. Skin: No rashes, lesions or ulcers Psychiatry: Judgement and insight appear normal. Mood & affect appropriate.    Data Reviewed:    Labs: Basic Metabolic Panel: Recent Labs  Lab 01/16/24 0014 01/22/24 0345 01/22/24 0548  NA 136 138 137  K 3.5 3.2* 3.8  CL 105 103 104  CO2 22 23 22   GLUCOSE 99 219* 194*  BUN 8 13 13   CREATININE 0.52 0.46 0.69  CALCIUM  8.9 9.1 9.0   GFR Estimated Creatinine Clearance: 88.4 mL/min (by C-G formula based on SCr of 0.69 mg/dL). Liver Function Tests: Recent Labs  Lab 01/16/24 0014 01/22/24 0345 01/22/24 0548  AST 37 90* 82*  ALT 38 47* 42   ALKPHOS 148* 181* 186*  BILITOT 0.3 0.6 0.6  PROT 7.0 7.6 7.5  ALBUMIN  3.6 3.9 3.6   Recent Labs  Lab 01/16/24 0014 01/22/24 0345  LIPASE 22 23   No results for input(s): AMMONIA in the last 168 hours. Coagulation profile No results for input(s): INR, PROTIME in the last 168 hours. COVID-19 Labs  No results for input(s): DDIMER, FERRITIN, LDH, CRP in the last 72 hours.  Lab Results  Component Value Date   SARSCOV2NAA NEGATIVE 12/06/2020    CBC: Recent Labs  Lab 01/16/24 0014 01/22/24 0225 01/22/24 0548  WBC 6.8 7.6 12.8*  NEUTROABS  --   --  10.2*  HGB 11.9* 12.5 13.7  HCT 37.5 40.9 45.1  MCV 91.7 94.7 94.0  PLT 312 334 382   Cardiac Enzymes: No results for input(s): CKTOTAL, CKMB, CKMBINDEX, TROPONINI in the last 168 hours. BNP (last 3 results) No results for input(s): PROBNP in the  last 8760 hours. CBG: No results for input(s): GLUCAP in the last 168 hours. D-Dimer: No results for input(s): DDIMER in the last 72 hours. Hgb A1c: No results for input(s): HGBA1C in the last 72 hours. Lipid Profile: No results for input(s): CHOL, HDL, LDLCALC, TRIG, CHOLHDL, LDLDIRECT in the last 72 hours. Thyroid  function studies: No results for input(s): TSH, T4TOTAL, T3FREE, THYROIDAB in the last 72 hours.  Invalid input(s): FREET3 Anemia work up: No results for input(s): VITAMINB12, FOLATE, FERRITIN, TIBC, IRON , RETICCTPCT in the last 72 hours. Sepsis Labs: Recent Labs  Lab 01/16/24 0014 01/22/24 0225 01/22/24 0226 01/22/24 0548  WBC 6.8 7.6  --  12.8*  LATICACIDVEN  --   --  1.7  --    Microbiology No results found for this or any previous visit (from the past 240 hours).   Medications:    Continuous Infusions:  heparin  1,300 Units/hr (01/22/24 0658)   lactated ringers  75 mL/hr at 01/22/24 0658   potassium chloride  10 mEq (01/22/24 0622)      LOS: 0 days   Jessica Banks  [facility]  01/22/2024, 7:09 AM

## 2024-01-22 NOTE — ED Notes (Signed)
Dr. Preston Fleeting assessing patient

## 2024-01-22 NOTE — ED Notes (Signed)
Tresa Endo PA notified of pts pain level and diaphoresis.

## 2024-01-22 NOTE — ED Triage Notes (Signed)
Pt presents via EMS c/o abd pain. Pt presents diaphoretic with RUQ abd pain with RUQ abd tenderness. Started at [employer] today. Pt screaming in pain on arrival.    Given 200 Mcg Fentanyl by EMS

## 2024-01-22 NOTE — ED Notes (Signed)
Psychologist, forensic, pending arrival, truck sent

## 2024-01-22 NOTE — Progress Notes (Signed)
 Patient underwent UGI this morning as ordered by oncall MD, Dr. Polly.  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  The UGI does however, show severe esophageal dysmotility.  No abnormal anatomic findings related to her bypass or on CT scan to explain her chronic LUQ abdominal pain.  We would recommend gastroenterology consult to eval for esophageal dysmotility +/- endo to evaluate this and her gastric pouch to assure no direct findings not seen on imaging.  I have relayed this to the primary service here at [facility], but informed she is transferring to [facility] for admission as they have an available bed.  No other acute surgical recommendations at this time.  Burnard FORBES Banter 2:06 PM 01/22/2024

## 2024-01-22 NOTE — Progress Notes (Signed)
 PHARMACY - ANTICOAGULATION CONSULT NOTE  Pharmacy Consult for heparin  Indication: pulmonary embolus  Allergies  Allergen Reactions   Novocain [Procaine] Anaphylaxis   Nsaids Other (See Comments)    H/o gastric bypass = avoid NSAIDs   Povidone Iodine Rash    Rash    Patient Measurements: Height: 5' (152.4 cm) Weight: 85.5 kg (188 lb 9.6 oz) IBW/kg (Calculated) : 45.5 Heparin  Dosing Weight: 83kg  Vital Signs: Temp: 97.8 F (36.6 C) (02/04 1929) Temp Source: Oral (02/04 1314) BP: 119/76 (02/04 1929) Pulse Rate: 82 (02/04 1929)  Labs: Recent Labs    01/22/24 0225 01/22/24 0345 01/22/24 0548 01/22/24 1324 01/22/24 1936  HGB 12.5  --  13.7  --   --   HCT 40.9  --  45.1  --   --   PLT 334  --  382  --   --   APTT  --   --   --  59* 79*  HEPARINUNFRC  --   --   --  0.79* 1.04*  CREATININE  --  0.46 0.69  --   --     Estimated Creatinine Clearance: 89.8 mL/min (by C-G formula based on SCr of 0.69 mg/dL).   Medical History: Past Medical History:  Diagnosis Date   Abnormal Pap smear    CIN I   Anemia    Anxiety    Depression    off meds now, ok now   Enlarged thyroid  gland    nromal function   GERD (gastroesophageal reflux disease)    Migraine    Obesity    Ovarian cyst    Vaginal Pap smear, abnormal    ok now     Assessment: 43 y/o female with hx of migraine, obesity, anxiety, and GERD presents to the ED for LEFT upper abdominal pain. Pain is recurrent and has been since her internal hernia surgery in December. Subsequently readmitted for postoperative ileus and pulmonary embolus. Pharmacy to dose heparin  drip. LD of eliquis  2/3 evening. Last aPTT returned therapeutic, will continue heparin  at current rate and recheck labs in the morning.   CBC WNL  Goal of Therapy:  Heparin  level 0.3-0.7 units/ml aPTT 66-102 seconds (currently following aPTT until heparin  levels correlate)  Monitor platelets by anticoagulation protocol: Yes   Plan:  Continue heparin   drip at 1300 units/hr Aptt with AM labs  Daily CBC and heparin  level  Clotilda LULLA Prime, PharmD, MSPH, BCPPS  01/22/2024, 8:42 PM

## 2024-01-23 ENCOUNTER — Inpatient Hospital Stay (HOSPITAL_COMMUNITY): Payer: Commercial Managed Care - PPO

## 2024-01-23 DIAGNOSIS — R112 Nausea with vomiting, unspecified: Secondary | ICD-10-CM

## 2024-01-23 DIAGNOSIS — R7401 Elevation of levels of liver transaminase levels: Secondary | ICD-10-CM

## 2024-01-23 DIAGNOSIS — D649 Anemia, unspecified: Secondary | ICD-10-CM

## 2024-01-23 DIAGNOSIS — R1010 Upper abdominal pain, unspecified: Secondary | ICD-10-CM | POA: Diagnosis not present

## 2024-01-23 LAB — APTT
aPTT: 109 s — ABNORMAL HIGH (ref 24–36)
aPTT: 87 s — ABNORMAL HIGH (ref 24–36)

## 2024-01-23 LAB — HEPATIC FUNCTION PANEL
ALT: 79 U/L — ABNORMAL HIGH (ref 0–44)
AST: 87 U/L — ABNORMAL HIGH (ref 15–41)
Albumin: 3.4 g/dL — ABNORMAL LOW (ref 3.5–5.0)
Alkaline Phosphatase: 191 U/L — ABNORMAL HIGH (ref 38–126)
Bilirubin, Direct: <0.1 mg/dL (ref 0.0–0.2)
Total Bilirubin: 0.5 mg/dL (ref 0.0–1.2)
Total Protein: 6.6 g/dL (ref 6.5–8.1)

## 2024-01-23 LAB — CBC
HCT: 32.3 % — ABNORMAL LOW (ref 36.0–46.0)
Hemoglobin: 10.4 g/dL — ABNORMAL LOW (ref 12.0–15.0)
MCH: 29.5 pg (ref 26.0–34.0)
MCHC: 32.2 g/dL (ref 30.0–36.0)
MCV: 91.8 fL (ref 80.0–100.0)
Platelets: 286 10*3/uL (ref 150–400)
RBC: 3.52 MIL/uL — ABNORMAL LOW (ref 3.87–5.11)
RDW: 13.7 % (ref 11.5–15.5)
WBC: 6.3 10*3/uL (ref 4.0–10.5)
nRBC: 0 % (ref 0.0–0.2)

## 2024-01-23 LAB — HEPARIN LEVEL (UNFRACTIONATED): Heparin Unfractionated: 1.05 [IU]/mL — ABNORMAL HIGH (ref 0.30–0.70)

## 2024-01-23 LAB — GLUCOSE, CAPILLARY: Glucose-Capillary: 104 mg/dL — ABNORMAL HIGH (ref 70–99)

## 2024-01-23 MED ORDER — SENNA 8.6 MG PO TABS
1.0000 | ORAL_TABLET | Freq: Every day | ORAL | Status: DC
  Administered 2024-01-23 – 2024-01-24 (×2): 8.6 mg via ORAL
  Filled 2024-01-23 (×4): qty 1

## 2024-01-23 MED ORDER — TEMAZEPAM 15 MG PO CAPS
15.0000 mg | ORAL_CAPSULE | Freq: Every day | ORAL | Status: DC
  Administered 2024-01-23 – 2024-01-25 (×3): 15 mg via ORAL
  Filled 2024-01-23 (×3): qty 1

## 2024-01-23 MED ORDER — HYDROMORPHONE HCL 1 MG/ML IJ SOLN
INTRAMUSCULAR | Status: AC
  Filled 2024-01-23: qty 0.5

## 2024-01-23 MED ORDER — HYDROMORPHONE HCL 1 MG/ML IJ SOLN
0.5000 mg | Freq: Once | INTRAMUSCULAR | Status: AC
  Administered 2024-01-23: 0.5 mg via INTRAVENOUS

## 2024-01-23 MED ORDER — DIPHENHYDRAMINE HCL 25 MG PO CAPS
25.0000 mg | ORAL_CAPSULE | Freq: Three times a day (TID) | ORAL | Status: DC | PRN
  Administered 2024-01-23: 25 mg via ORAL
  Filled 2024-01-23: qty 1

## 2024-01-23 MED ORDER — IOHEXOL 300 MG/ML  SOLN
30.0000 mL | Freq: Once | INTRAMUSCULAR | Status: AC | PRN
  Administered 2024-01-23: 30 mL via ORAL

## 2024-01-23 MED ORDER — IOHEXOL 350 MG/ML SOLN
75.0000 mL | Freq: Once | INTRAVENOUS | Status: AC | PRN
  Administered 2024-01-23: 75 mL via INTRAVENOUS

## 2024-01-23 MED ORDER — LACTATED RINGERS IV SOLN: INTRAVENOUS | Status: DC

## 2024-01-23 MED ORDER — AMPHETAMINE-DEXTROAMPHET ER 10 MG PO CP24
30.0000 mg | ORAL_CAPSULE | Freq: Every day | ORAL | Status: DC
  Administered 2024-01-23 – 2024-01-26 (×4): 30 mg via ORAL
  Filled 2024-01-23 (×4): qty 3

## 2024-01-23 MED ORDER — DULOXETINE HCL 20 MG PO CPEP
60.0000 mg | ORAL_CAPSULE | Freq: Every day | ORAL | Status: DC
  Administered 2024-01-23 – 2024-01-26 (×4): 60 mg via ORAL
  Filled 2024-01-23 (×4): qty 3

## 2024-01-23 MED ORDER — HYDROMORPHONE HCL 1 MG/ML IJ SOLN
1.0000 mg | INTRAMUSCULAR | Status: DC | PRN
  Administered 2024-01-23 – 2024-01-26 (×8): 1 mg via INTRAVENOUS
  Filled 2024-01-23 (×9): qty 1

## 2024-01-23 NOTE — Progress Notes (Signed)
 Subjective: CC: Our team was called back for recurrent abdominal pain.  Patient reports she was doing well this morning-tolerating a diet without nausea or vomiting.  She only had some mild abdominal soreness that was not worse with p.o. intake.  Passing flatus.  BM today.  Shortly before my evaluation she began having severe epigastric abdominal pain with radiation to her left flank with associated nausea.  She states this is similar to her prior episodes. Not related to po intake. Tachycardic.    Objective: Vital signs in last 24 hours: Temp:  [97.7 F (36.5 C)-98.4 F (36.9 C)] 98.4 F (36.9 C) (02/05 1336) Pulse Rate:  [82-132] 132 (02/05 1336) Resp:  [17-22] 22 (02/05 1336) BP: (109-131)/(61-76) 114/61 (02/05 1336) SpO2:  [98 %-100 %] 100 % (02/05 1336) Weight:  [85.5 kg] 85.5 kg (02/04 1607) Last BM Date : 01/21/24  Intake/Output from previous day: 02/04 0701 - 02/05 0700 In: 1383.6 [P.O.:480; I.V.:903.6] Out: 400 [Urine:400] Intake/Output this shift: Total I/O In: 360 [P.O.:360] Out: 1100 [Urine:1100]  PE: Gen:  Alert, NAD, pleasant Abd: Soft, at least mild distension, LUQ abdominal ttp, +BS, incisions healing well, cdi without signs of infection.  Lab Results:  Recent Labs    01/22/24 0548 01/23/24 0513  WBC 12.8* 6.3  HGB 13.7 10.4*  HCT 45.1 32.3*  PLT 382 286   BMET Recent Labs    01/22/24 0345 01/22/24 0548  NA 138 137  K 3.2* 3.8  CL 103 104  CO2 23 22  GLUCOSE 219* 194*  BUN 13 13  CREATININE 0.46 0.69  CALCIUM  9.1 9.0   PT/INR No results for input(s): LABPROT, INR in the last 72 hours. CMP     Component Value Date/Time   NA 137 01/22/2024 0548   NA 138 09/23/2023 0000   K 3.8 01/22/2024 0548   CL 104 01/22/2024 0548   CO2 22 01/22/2024 0548   GLUCOSE 194 (H) 01/22/2024 0548   BUN 13 01/22/2024 0548   BUN 9 09/23/2023 0000   CREATININE 0.69 01/22/2024 0548   CREATININE 0.60 04/06/2016 1700   CALCIUM  9.0 01/22/2024  0548   PROT 7.5 01/22/2024 0548   PROT 6.9 03/28/2023 1000   ALBUMIN  3.6 01/22/2024 0548   ALBUMIN  4.2 03/28/2023 1000   AST 82 (H) 01/22/2024 0548   ALT 42 01/22/2024 0548   ALKPHOS 186 (H) 01/22/2024 0548   BILITOT 0.6 01/22/2024 0548   BILITOT <0.2 03/28/2023 1000   GFRNONAA >60 01/22/2024 0548   GFRNONAA >89 04/06/2016 1700   GFRAA >60 04/23/2019 0105   GFRAA >89 04/06/2016 1700   Lipase     Component Value Date/Time   LIPASE 23 01/22/2024 0345    Studies/Results: DG UGI W SINGLE CM (SOL OR THIN BA) Result Date: 01/22/2024 CLINICAL DATA:  Severe left-sided abdominal pain and intermittent vomiting. Two months postop from internal hernia repair previous Roux-en-Y gastric bypass surgery and hiatal hernia repair. EXAM: UPPER GI SERIES WITH KUB TECHNIQUE: After obtaining a scout radiograph a routine upper GI series was performed using water -soluble Omnipaque  followed by thin density barium. FLUOROSCOPY: Radiation Exposure Index (as provided by the fluoroscopic device): 45.5 mGy Kerma COMPARISON:  None Available. FINDINGS: Scout radiograph shows no evidence of dilated bowel loops. Large amount of stool is noted in the right colon. Patient initially drank 100 mL water -soluble Omnipaque  contrast which showed no evidence of contrast leak or obstruction. Single contrast upper GI series with thin barium shows no evidence  of esophageal mass or stricture. Severe esophageal dysmotility is seen, with severe stasis of contrast in the esophagus unless the patient is standing completely erect. Roux-en-Y gastric bypass anatomy is seen. Gastric pouch is mildly enlarged with a small hiatal hernia noted. Prompt contrast emptying into efferent jejunum is seen, with normal appearance of the gastrojejunal anastomosis. Efferent jejunum is located in the left abdomen, and is nondilated and normal in appearance. No evidence of stricture or abnormal fold thickening. IMPRESSION: Roux-en-Y gastric bypass anatomy. No  evidence of contrast leak, stricture, or obstruction. Mildly enlarged gastric pouch, with small hiatal hernia. Severe esophageal dysmotility. Electronically Signed   By: Norleen DELENA Kil M.D.   On: 01/22/2024 10:03   DG ABD ACUTE 2+V W 1V CHEST Result Date: 01/22/2024 CLINICAL DATA:  [id number]. Abdominal pain. Severe diffuse abdominal pain.  Recent small-bowel obstruction. EXAM: DG ABDOMEN ACUTE WITH 1 VIEW CHEST COMPARISON:  Recent CT with IV contrast 01/16/2024 FINDINGS: There is no evidence of dilated bowel loops or free intraperitoneal air. There is mild fecal stasis. Postsurgical changes of prior gastric bypass and cholecystectomy are again shown. No radiopaque calculi or other significant radiographic abnormality is seen. Heart size and mediastinal contours are within normal limits. Both lungs are clear. Thoracic cage is intact with mild thoracic dextroscoliosis. IMPRESSION: 1. No evidence of bowel obstruction or free air. 2. Mild fecal stasis. 3. No acute radiographic chest findings. Electronically Signed   By: Francis Quam M.D.   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 Dasie  UGI 2/4 with severe esophageal dysmotility. This was reviewed by one of our bariatric surgeons yesterday. 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  Will repeat CT scan with PO contrast  Agree w/ GI plan for EGD  Agree w/ Dr. Polly that lesion at the tail of her pancreas is unlikely to be a source of her complaints. Recommend mrcp as outpatient/per GI     LOS: 1 day    Ozell CHRISTELLA Shaper , [facility] 01/23/2024, 1:43 PM Please see Amion for pager number during day hours 7:00am-4:30pm

## 2024-01-23 NOTE — Progress Notes (Signed)
 Pt screaming in pain and writhing in the bed. Pt c/o 10/10 left upper abd pain. Harlene Bowl, DO notified with new orders received.    1335- Dr Suzann at bedside  1340-Rapid Response RN at bedside 1345-Portable x-ray done 1350-Michael Maczis, PA at bedside  1413-General Surgeon at bedside

## 2024-01-23 NOTE — Significant Event (Signed)
 Rapid Response Event Note   Reason for Call :  Acute sever abdominal pain  Initial Focused Assessment:  Patient is standing folded over in sever abdominal pain.  She describes the pain as knotting up into a ball starting in her left upper quadrant and wrapping back to her mid back.    BP 145/74  HR 107  RR 20-24  O2 sat 100% on RA  Oral temp 98.4    Interventions:  0.5mg  Dilaudid  IV Portable abdominal Xray done Dr Ebbie and Ozell Shaper PA at bedside to assess patient.  Pain has gone from a score of 10/10 to 7-8/10 Additional 0.5mg  Dilaudid  IV Hot pad to abdomen   Plan of Care:  CT with oral contrast  RN to call if patient has additional sever unrelieved abdominal pain.   Event Summary:   MD Notified: Dr Juvenal prior to my arrival Call Time:  1332 Arrival Time:  1335 End Time:  1500  Elvin Portland, RN

## 2024-01-23 NOTE — Progress Notes (Signed)
 PHARMACY - ANTICOAGULATION CONSULT NOTE  Pharmacy Consult for heparin  Indication: pulmonary embolus  Allergies  Allergen Reactions   Novocain [Procaine] Anaphylaxis   Nsaids Other (See Comments)    H/o gastric bypass = avoid NSAIDs   Povidone Iodine Rash    Rash    Patient Measurements: Height: 5' (152.4 cm) Weight: 85.5 kg (188 lb 9.6 oz) IBW/kg (Calculated) : 45.5 Heparin  Dosing Weight: 83kg  Vital Signs: Temp: 98.3 F (36.8 C) (02/04 2318) Temp Source: Oral (02/04 2318) BP: 121/70 (02/04 2318) Pulse Rate: 101 (02/04 2318)  Labs: Recent Labs    01/22/24 0225 01/22/24 0345 01/22/24 0548 01/22/24 1324 01/22/24 1936 01/23/24 0513  HGB 12.5  --  13.7  --   --  10.4*  HCT 40.9  --  45.1  --   --  32.3*  PLT 334  --  382  --   --  286  APTT  --   --   --  59* 79* 109*  HEPARINUNFRC  --   --   --  0.79* 1.04* 1.05*  CREATININE  --  0.46 0.69  --   --   --     Estimated Creatinine Clearance: 89.8 mL/min (by C-G formula based on SCr of 0.69 mg/dL).   Medical History: Past Medical History:  Diagnosis Date   Abnormal Pap smear    CIN I   Anemia    Anxiety    Depression    off meds now, ok now   Enlarged thyroid  gland    nromal function   GERD (gastroesophageal reflux disease)    Migraine    Obesity    Ovarian cyst    Vaginal Pap smear, abnormal    ok now     Assessment: 43 y/o female with hx of migraine, obesity, anxiety, and GERD presents to the ED for LEFT upper abdominal pain. Pain is recurrent and has been since her internal hernia surgery in December. Subsequently readmitted for postoperative ileus and pulmonary embolus. Pharmacy to dose heparin  drip. LD of eliquis  2/3 evening. Last aPTT returned slightly supratherapeutic at 109, will decrease heparin  dose and recheck in six hours.   Goal of Therapy:  Heparin  level 0.3-0.7 units/ml aPTT 66-102 seconds (currently following aPTT until heparin  levels correlate)  Monitor platelets by anticoagulation  protocol: Yes   Plan:  Decrease heparin  drip to 1200 units/hr APTT and anti-Xa level at 1430  Will continue to monitor for s/sx of bleeding  Glenys Bidding, PharmD, BCPPS 01/23/2024 8:53 AM

## 2024-01-23 NOTE — Progress Notes (Signed)
 [facility] PROGRESS NOTE    Progress Note  Jessica Banks  FMW:[id number] DOB: September 25, 1981 DOA: 01/22/2024 PCP: Jarold Medici, MD     Brief Narrative:   Jessica Banks is an 43 y.o. female past medical history or recent surgery for internal hernia in December 2024, following this she presented and admitted for PE on Eliquis , with a history of Roux-en-Y and gastric sleeve surgery, history of cholecystectomy on December 2024 history of ADHD comes in for abdominal pain that has been going on for a month, had a visit in the ED on 01/16/2024 CT scan of the abdomen pelvis showed lesion in the pancreatic tail.  Was recommended to follow-up with general surgery, due to worsening abdominal pain came into the hospital mostly left upper quadrant denies nausea vomiting diarrhea fever and chills.  In the ED was found to elevated AST and Alt 90/47 bilirubin 0.6 alkaline phosphatase of 181, white blood cell count of 13, and a potassium of 3.2.  General surgery was consulted and recommended a GI consult.   Assessment/Plan:   New left flank intractable Abdominal pain: CT scan of the abdomen pelvis showed lesion in the pancreatic tail measuring 1 cm, small volume ascites. She relates she had a normal bowel movement the day prior to admission. Abdominal series showed no evidence of bowel obstruction free air.  Lipase is unremarkable General surgery has been consulted, agree with x-ray and to hold on an MRCP.  The lesion of the pancreas is unlikely to be the source of her new complaint. They recommended an UGI and GI consult -GI consult placed for ? EGD  Elevated LFTs: Elevated alkaline phosphatase, mildly elevated LFTs unremarkable bilirubin. -defer to GI  History of PE on Eliquis : -IV heparin  in anticipation for procedure  Elevated blood pressure without a diagnosis of essential hypertension: Continue hydralazine  IV as needed.  Hyperglycemia: -A1c:  5.6  Hypokalemia: -replete  History of DDD: -Continue Adderall .  History of depression:  -Continue Cymbalta .  Constipation -bowel regimen    DVT prophylaxis: scd Family Communication:mother   IV Access:   Peripheral IV   Procedures and diagnostic studies:   DG UGI W SINGLE CM (SOL OR THIN BA) Result Date: 01/22/2024 CLINICAL DATA:  Severe left-sided abdominal pain and intermittent vomiting. Two months postop from internal hernia repair previous Roux-en-Y gastric bypass surgery and hiatal hernia repair. EXAM: UPPER GI SERIES WITH KUB TECHNIQUE: After obtaining a scout radiograph a routine upper GI series was performed using water -soluble Omnipaque  followed by thin density barium. FLUOROSCOPY: Radiation Exposure Index (as provided by the fluoroscopic device): 45.5 mGy Kerma COMPARISON:  None Available. FINDINGS: Scout radiograph shows no evidence of dilated bowel loops. Large amount of stool is noted in the right colon. Patient initially drank 100 mL water -soluble Omnipaque  contrast which showed no evidence of contrast leak or obstruction. Single contrast upper GI series with thin barium shows no evidence of esophageal mass or stricture. Severe esophageal dysmotility is seen, with severe stasis of contrast in the esophagus unless the patient is standing completely erect. Roux-en-Y gastric bypass anatomy is seen. Gastric pouch is mildly enlarged with a small hiatal hernia noted. Prompt contrast emptying into efferent jejunum is seen, with normal appearance of the gastrojejunal anastomosis. Efferent jejunum is located in the left abdomen, and is nondilated and normal in appearance. No evidence of stricture or abnormal fold thickening. IMPRESSION: Roux-en-Y gastric bypass anatomy. No evidence of contrast leak, stricture, or obstruction. Mildly enlarged gastric pouch, with small hiatal hernia. Severe  esophageal dysmotility. Electronically Signed   By: Norleen DELENA Kil M.D.   On: 01/22/2024 10:03    DG ABD ACUTE 2+V W 1V CHEST Result Date: 01/22/2024 CLINICAL DATA:  [id number]. Abdominal pain. Severe diffuse abdominal pain.  Recent small-bowel obstruction. EXAM: DG ABDOMEN ACUTE WITH 1 VIEW CHEST COMPARISON:  Recent CT with IV contrast 01/16/2024 FINDINGS: There is no evidence of dilated bowel loops or free intraperitoneal air. There is mild fecal stasis. Postsurgical changes of prior gastric bypass and cholecystectomy are again shown. No radiopaque calculi or other significant radiographic abnormality is seen. Heart size and mediastinal contours are within normal limits. Both lungs are clear. Thoracic cage is intact with mild thoracic dextroscoliosis. IMPRESSION: 1. No evidence of bowel obstruction or free air. 2. Mild fecal stasis. 3. No acute radiographic chest findings. Electronically Signed   By: Francis Quam M.D.   On: 01/22/2024 06:52     Medical Consultants:   [facility]   Subjective:    No current pain but had at least 4 episodes in the last month  Objective:    Vitals:   01/22/24 2210 01/22/24 2318 01/23/24 0834 01/23/24 1222  BP: 129/71 121/70 109/72 131/71  Pulse: 96 (!) 101 85 92  Resp: 17 18 17  (!) 22  Temp: 97.7 F (36.5 C) 98.3 F (36.8 C) 98 F (36.7 C) 98 F (36.7 C)  TempSrc: Oral Oral Oral Oral  SpO2: 98% 99% 99% 98%  Weight:      Height:       SpO2: 98 %   Intake/Output Summary (Last 24 hours) at 01/23/2024 1249 Last data filed at 01/23/2024 1225 Gross per 24 hour  Intake 1743.55 ml  Output 1500 ml  Net 243.55 ml   Filed Weights   01/22/24 1607  Weight: 85.5 kg    Exam:  General: Appearance:    Obese female in no acute distress     Lungs:     respirations unlabored  Heart:    Normal heart rate. .   MS:   All extremities are intact.   Neurologic:   Awake, alert      Data Reviewed:    Labs: Basic Metabolic Panel: Recent Labs  Lab 01/22/24 0345 01/22/24 0548  NA 138 137  K 3.2* 3.8  CL 103 104  CO2 23 22  GLUCOSE 219* 194*  BUN  13 13  CREATININE 0.46 0.69  CALCIUM  9.1 9.0   GFR Estimated Creatinine Clearance: 89.8 mL/min (by C-G formula based on SCr of 0.69 mg/dL). Liver Function Tests: Recent Labs  Lab 01/22/24 0345 01/22/24 0548  AST 90* 82*  ALT 47* 42  ALKPHOS 181* 186*  BILITOT 0.6 0.6  PROT 7.6 7.5  ALBUMIN  3.9 3.6   Recent Labs  Lab 01/22/24 0345  LIPASE 23   No results for input(s): AMMONIA in the last 168 hours. Coagulation profile No results for input(s): INR, PROTIME in the last 168 hours. COVID-19 Labs  No results for input(s): DDIMER, FERRITIN, LDH, CRP in the last 72 hours.  Lab Results  Component Value Date   SARSCOV2NAA NEGATIVE 12/06/2020    CBC: Recent Labs  Lab 01/22/24 0225 01/22/24 0548 01/23/24 0513  WBC 7.6 12.8* 6.3  NEUTROABS  --  10.2*  --   HGB 12.5 13.7 10.4*  HCT 40.9 45.1 32.3*  MCV 94.7 94.0 91.8  PLT 334 382 286   Cardiac Enzymes: No results for input(s): CKTOTAL, CKMB, CKMBINDEX, TROPONINI in the last 168 hours. BNP (last 3  results) No results for input(s): PROBNP in the last 8760 hours. CBG: No results for input(s): GLUCAP in the last 168 hours. D-Dimer: No results for input(s): DDIMER in the last 72 hours. Hgb A1c: Recent Labs    01/22/24 0548  HGBA1C 5.6   Lipid Profile: No results for input(s): CHOL, HDL, LDLCALC, TRIG, CHOLHDL, LDLDIRECT in the last 72 hours. Thyroid  function studies: No results for input(s): TSH, T4TOTAL, T3FREE, THYROIDAB in the last 72 hours.  Invalid input(s): FREET3 Anemia work up: No results for input(s): VITAMINB12, FOLATE, FERRITIN, TIBC, IRON , RETICCTPCT in the last 72 hours. Sepsis Labs: Recent Labs  Lab 01/22/24 0225 01/22/24 0226 01/22/24 0548 01/23/24 0513  WBC 7.6  --  12.8* 6.3  LATICACIDVEN  --  1.7  --   --    Microbiology No results found for this or any previous visit (from the past 240 hours).   Medications:     amphetamine -dextroamphetamine   30 mg Oral Daily   DULoxetine   60 mg Oral Daily   pantoprazole  (PROTONIX ) IV  40 mg Intravenous Q12H   senna  1 tablet Oral Daily   temazepam   15 mg Oral QHS   Continuous Infusions:  heparin  1,200 Units/hr (01/23/24 0832)   lactated ringers  75 mL/hr at 01/23/24 1018      LOS: 1 day   Suni Jarnagin U Norely Schlick  [facility]  01/23/2024, 12:49 PM

## 2024-01-23 NOTE — Consult Note (Addendum)
 Consultation Note   Referring Provider:  [facility] PCP: Jarold Medici, MD Primary Gastroenterologist: [facility]       Reason for Consultation: abdominal pain / vomiting  DOA: 01/22/2024         Hospital Day: 2   ASSESSMENT    Brief Narrative:  43 y.o. 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 and hiatal hernia repair in August 2023 , internal hernia repair and cholecystectomy December 2024 , anxiety, depression, ADHD , migraines, pulmonary embolism anticoagulant  Obesity  with history of a gastric sleeve later followed by Roux-en-Y bypass and hiatal hernia repair August 2023. Following that she had internal hernia repair and cholecystectomy in Dec 2024  Severe, episodic LUQ pain since internal hernia repair and cholecystectomy Dec 2024 , cause unclear.  No acute findings on CT scan or UGI series .   Severe esophageal dysmotility on UGI series. Surprisingly she denies dysphagia. Occasionally vomits after eating but emesis fluid, not food  Pulmonary embolism, on Eliquis  at home Eliquis  on hold, last dose on Monday. On IV heparin   Chronic, intermittently elevated alkaline phosphatase with new mild elevation of AST < 3x ULN GGT in 2022 was normal so abnormalities probably not hepatobiliary in origin.   Mild anemia (after IV fluids) but does have a history of IDA.  History of Roux-en-Y. Not iron  deficient by iron  studies.     PLAN:   --General Surgery has evaluated, no surgical needs at this time.   --Most likely will need an EGD to evaluate severe LUQ pain ,  gastric pouch and also severe esophageal dysmotility seen on upper GI series. The risks and benefits of EGD with possible biopsies were discussed with the patient who agrees to proceed.  --IV Heparin  will need to be held 4-6 hours prior to EGD so procedure will most likely need to wait until  tomorrow.   --Eventual MRI  / MRCP as General Surgery to further evaluate the 1 cm fluid collection involving pancreatic tail.  Depending on clinical course this may can still wait to be done in the outpatient setting vrs proceeding inpatient.  --obtain AMA --Check iron  studies   Addendum: EGD confirmed for 8 am tomorrow . 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  More recently in December 2024 she underwent internal hernia repair and cholecystectomy .  She developed a postoperative ileus  vrs SBO and pulmonary embolus requiring readmission . Since surgery she has had episodic, severe LUQ pain often radiating around and through to her back. She cannot relate the pain to anything. It has no relationship to food, physical activity or BMs. A few times she was able to manage the pain with heat, walking, and tylenol . One episode was so intense she went to ED on 1/29 and got a CT scan which   showed a 1 cm pancreatic lesion but no acute findings to explain pain.  She has also seen her Surgery and was scheduled to have upper GI series as well as an MRCP. However, she has continued to have  the severe, debilitating episodes of LUQ pain and came back to ED. Episodes are sometimes associated with nausea but no vomiting.   The pain has been so severe that multiple episodes of pain meds didn't help.   CT AP with contrast 01/16/24 ( recent ED visit) IMPRESSION: 1. Small volume simple free ascites. 2. Small hiatal hernia in a patient status post Roux-en-Y gastric bypass. 3. Slightly more conspicuous persistent pancreatic tail 1 cm fluid density lesion. When the patient is clinically stable and able to follow directions and hold their breath (preferably as an outpatient) further evaluation with dedicated MRI pancreatic protocol should be considered.  Her labs are remarkable for transient  leukocytosis, now resolved. A drop in hgb after IV fluids. Her lipase is normal. Alk phos is elevated at 186 . AST 90,. Tbili normal.   UGI series 01/23/24 Roux-en-Y gastric bypass anatomy. No evidence of contrast leak,stricture, or obstruction.. Mildly enlarged gastric pouch, with small hiatal hernia. Severe esophageal dysmotility.   Labs and Imaging: Recent Labs    01/22/24 0225 01/22/24 0548 01/23/24 0513  WBC 7.6 12.8* 6.3  HGB 12.5 13.7 10.4*  HCT 40.9 45.1 32.3*  PLT 334 382 286   Recent Labs    01/22/24 0345 01/22/24 0548  NA 138 137  K 3.2* 3.8  CL 103 104  CO2 23 22  GLUCOSE 219* 194*  BUN 13 13  CREATININE 0.46 0.69  CALCIUM  9.1 9.0   Recent Labs    01/22/24 0548  PROT 7.5  ALBUMIN  3.6  AST 82*  ALT 42  ALKPHOS 186*  BILITOT 0.6  BILIDIR 0.2  IBILI 0.4   No results for input(s): HEPBSAG, HCVAB, HEPAIGM, HEPBIGM in the last 72 hours. No results for input(s): LABPROT, INR in the last 72 hours.    Past Medical History:  Diagnosis Date   Abnormal Pap smear    CIN I   Anemia    Anxiety    Depression    off meds now, ok now   Enlarged thyroid  gland    nromal function   GERD (gastroesophageal reflux disease)    Migraine    Obesity    Ovarian cyst    Vaginal Pap smear, abnormal    ok now    Past Surgical History:  Procedure Laterality Date   GASTRIC BYPASS  08/14/2022   Revision of sleeve/hiatal hernia repair   LAPAROSCOPIC GASTRIC SLEEVE RESECTION  2017   LAPAROSCOPY N/A 12/05/2023   Procedure: LAPAROSCOPY DIAGNOSTIC, CHOLECYSTECOMY, INTERNAL HERNIA REPAIR;  Surgeon: Dasie Leonor CROME, MD;  Location: [facility];  Service: General;  Laterality: N/A;    Family History  Problem Relation Age of Onset   Thyroid  disease Mother        s/p thyroidectomy   Arthritis Mother    Leukemia Maternal Grandmother    Stroke Maternal Grandmother    Cancer Maternal Grandfather        lung   Heart disease Paternal Grandmother    COPD Paternal  Grandfather    Hypertension Maternal Uncle    Diabetes Maternal Uncle     Prior to Admission medications   Medication Sig Start Date End Date Taking? Authorizing Provider  acetaminophen  (TYLENOL ) 500 MG tablet Take 2 tablets (1,000 mg total) by mouth every 6 (six) hours as needed. Patient taking differently: Take 1,000 mg by mouth every 6 (six) hours as needed for mild pain (pain score 1-3) or moderate pain (pain score 4-6). 12/07/23  Yes Tammy Sor, PA-C  amphetamine -dextroamphetamine  (ADDERALL   XR) 30 MG 24 hr capsule Take 30 mg by mouth daily.   Yes [provider]  apixaban  (ELIQUIS ) 5 MG TABS tablet Take 1 tablet (5 mg total) by mouth 2 (two) times daily. 01/21/24 04/20/24 Yes Ghimire, Kuber, MD  butalbital -acetaminophen -caffeine  (FIORICET ) 50-325-40 MG tablet Take 1 tablet by mouth 2 (two) times daily as needed. Patient taking differently: Take 2 tablets by mouth daily as needed for headache or migraine. 11/19/23  Yes Penumalli, Eduard SAUNDERS, MD  cyclobenzaprine  (FLEXERIL ) 10 MG tablet Take 1 tablet (10 mg total) by mouth daily as needed. Patient taking differently: Take 10 mg by mouth daily as needed for muscle spasms. 11/19/23  Yes Penumalli, Eduard SAUNDERS, MD  DULoxetine  (CYMBALTA ) 60 MG capsule Take 1 capsule (60 mg total) by mouth daily. 11/19/23  Yes Penumalli, Vikram R, MD  Ferrous Sulfate  (IRON  PO) Take 1 tablet by mouth at bedtime.   Yes [provider]  Galcanezumab -gnlm (EMGALITY ) 120 MG/ML SOAJ Inject 1 Pen into the skin every 30 (thirty) days. 11/19/23  Yes Penumalli, Vikram R, MD  Melatonin 10 MG CAPS Take 10 mg by mouth at bedtime.   Yes [provider]  Multiple Vitamin (MULTIVITAMIN) tablet Take 1 tablet by mouth at bedtime.   Yes [provider]  oxyCODONE  (OXY IR/ROXICODONE ) 5 MG immediate release tablet Take 5 mg by mouth daily as needed for moderate pain (pain score 4-6) or severe pain (pain score 7-10). 01/16/24 01/26/24 Yes [provider]   Probiotic Product (FORTIFY PROBIOTIC WOMENS PO) Take 1 capsule by mouth at bedtime.   Yes [provider]  Rimegepant Sulfate (NURTEC) 75 MG TBDP Take 1 tablet (75 mg total) by mouth daily as needed. Patient taking differently: Take 75 mg by mouth daily as needed (migraine). 11/19/23  Yes Penumalli, Eduard SAUNDERS, MD  temazepam  (RESTORIL ) 15 MG capsule TAKE 1 CAPSULE BY MOUTH AT BEDTIME AS NEEDED FOR SLEEP Patient taking differently: Take 15 mg by mouth at bedtime. 10/17/23  Yes Jarold Medici, MD  VITAMIN D  PO Take 1 capsule by mouth at bedtime.   Yes [provider]  gabapentin  (NEURONTIN ) 300 MG capsule Take by mouth. Patient not taking: Reported on 01/22/2024 01/02/24   [provider]  oxyCODONE -acetaminophen  (PERCOCET/ROXICET) 5-325 MG tablet Take 1 tablet by mouth every 8 (eight) hours as needed for severe pain (pain score 7-10) or moderate pain (pain score 4-6). Patient not taking: Reported on 01/22/2024 12/16/23   [name], MD    Current Facility-Administered Medications  Medication Dose Route Frequency Provider Last Rate Last Admin   heparin  ADULT infusion 100 units/mL (25000 units/250mL)  1,200 Units/hr Intravenous Continuous Vann, Jessica U, DO 12 mL/hr at 01/23/24 0832 1,200 Units/hr at 01/23/24 9167   hydrALAZINE  (APRESOLINE ) injection 5 mg  5 mg Intravenous Q4H PRN Franky Redia SAILOR, MD       HYDROmorphone  (DILAUDID ) injection 1 mg  1 mg Intravenous Q4H PRN Odell Celinda Balo, MD   1 mg at 01/23/24 0858   ondansetron  (ZOFRAN ) injection 4 mg  4 mg Intravenous Q6H PRN Kakrakandy, Arshad N, MD   4 mg at 01/22/24 1014   pantoprazole  (PROTONIX ) injection 40 mg  40 mg Intravenous Q12H Odell Celinda Balo, MD   40 mg at 01/22/24 2204    Allergies as of 01/22/2024 - Review Complete 01/22/2024  Allergen Reaction Noted   Novocain [procaine] Anaphylaxis 07/17/2013   Nsaids Other (See Comments) 12/15/2023   Povidone iodine Rash 09/05/2021    Social History  Socioeconomic History   Marital status: Married    Spouse name: n/a   Number of children: 0   Years of education: master's   Highest education level: Master's degree (e.g., MA, MS, MEng, MEd, MSW, MBA)  Occupational History   Occupation: RN    Comment: [place]/[employer]   Occupation: NP    Comment: graduated/boards spring 2014  Tobacco Use   Smoking status: Former    Current packs/day: 0.00    Types: Cigarettes    Start date: 06/07/2009    Quit date: 06/07/2013    Years since quitting: 10.6   Smokeless tobacco: Never   Tobacco comments:    only smoked on the weekends  Vaping Use   Vaping status: Never Used  Substance and Sexual Activity   Alcohol use: Not Currently    Comment: SOCIAL    Drug use: No   Sexual activity: Not Currently  Other Topics Concern   Not on file  Social History Narrative   Not on file   Social Drivers of Health   Financial Resource Strain: Low Risk  (11/19/2023)   Overall Financial Resource Strain (CARDIA)    Difficulty of Paying Living Expenses: Not hard at all  Food Insecurity: No Food Insecurity (01/22/2024)   Hunger Vital Sign    Worried About Running Out of Food in the Last Year: Never true    Ran Out of Food in the Last Year: Never true  Transportation Needs: No Transportation Needs (01/22/2024)   PRAPARE - Administrator, Civil Service (Medical): No    Lack of Transportation (Non-Medical): No  Physical Activity: Sufficiently Active (11/19/2023)   Exercise Vital Sign    Days of Exercise per Week: 4 days    Minutes of Exercise per Session: 60 min  Stress: No Stress Concern Present (11/19/2023)   [employer] of Occupational Health - Occupational Stress Questionnaire    Feeling of Stress : Not at all  Social Connections: Socially Integrated (11/19/2023)   Social Connection and Isolation Panel [NHANES]    Frequency of Communication with Friends and Family: More than three times a week    Frequency of Social Gatherings with  Friends and Family: Once a week    Attends Religious Services: More than 4 times per year    Active Member of [employer] or Organizations: Yes    Attends Engineer, Structural: More than 4 times per year    Marital Status: Married  Catering Manager Violence: Not At Risk (01/22/2024)   Humiliation, Afraid, Rape, and Kick questionnaire    Fear of Current or Ex-Partner: No    Emotionally Abused: No    Physically Abused: No    Sexually Abused: No     Code Status   Code Status: Full Code  Review of Systems: All systems reviewed and negative except where noted in HPI.  Physical Exam: Vital signs in last 24 hours: Temp:  [97.3 F (36.3 C)-98.3 F (36.8 C)] 98.3 F (36.8 C) (02/04 2318) Pulse Rate:  [82-109] 101 (02/04 2318) Resp:  [17-27] 18 (02/04 2318) BP: (119-163)/(70-125) 121/70 (02/04 2318) SpO2:  [97 %-100 %] 99 % (02/04 2318) Weight:  [85.5 kg] 85.5 kg (02/04 1607) Last BM Date : 01/21/24  General:  Pleasant female in NAD Psych:  Cooperative. Normal mood and affect Eyes: Pupils equal Ears:  Normal auditory acuity Nose: No deformity, discharge or lesions Neck:  Supple, no masses felt Lungs:  Clear to auscultation.  Heart:  Regular rate, regular  rhythm.  Abdomen:  Soft, nondistended, nontender, active bowel sounds, no masses felt Rectal :  Deferred Msk: Symmetrical without gross deformities.  Neurologic:  Alert, oriented, grossly normal neurologically Extremities : No edema Skin:  Intact without significant lesions.    Intake/Output from previous day: 02/04 0701 - 02/05 0700 In: 1383.6 [P.O.:480; I.V.:903.6] Out: 400 [Urine:400] Intake/Output this shift:  No intake/output data recorded.   Vina Dasen, NP-C   01/23/2024, 9:20 AM

## 2024-01-23 NOTE — Progress Notes (Signed)
 PHARMACY - ANTICOAGULATION CONSULT NOTE  Pharmacy Consult for heparin  Indication: pulmonary embolus  Allergies  Allergen Reactions   Novocain [Procaine] Anaphylaxis   Nsaids Other (See Comments)    H/o gastric bypass = avoid NSAIDs   Povidone Iodine Rash    Rash    Patient Measurements: Height: 5' (152.4 cm) Weight: 85.5 kg (188 lb 9.6 oz) IBW/kg (Calculated) : 45.5 Heparin  Dosing Weight: 83kg  Vital Signs: Temp: 98.4 F (36.9 C) (02/05 1336) Temp Source: Oral (02/05 1336) BP: 140/71 (02/05 1417) Pulse Rate: 104 (02/05 1417)  Labs: Recent Labs    01/22/24 0225 01/22/24 0345 01/22/24 0548 01/22/24 1324 01/22/24 1324 01/22/24 1936 01/23/24 0513 01/23/24 1453  HGB 12.5  --  13.7  --   --   --  10.4*  --   HCT 40.9  --  45.1  --   --   --  32.3*  --   PLT 334  --  382  --   --   --  286  --   APTT  --   --   --  59*   < > 79* 109* 87*  HEPARINUNFRC  --   --   --  0.79*  --  1.04* 1.05*  --   CREATININE  --  0.46 0.69  --   --   --   --   --    < > = values in this interval not displayed.    Estimated Creatinine Clearance: 89.8 mL/min (by C-G formula based on SCr of 0.69 mg/dL).   Medical History: Past Medical History:  Diagnosis Date   Abnormal Pap smear    CIN I   Anemia    Anxiety    Depression    off meds now, ok now   Enlarged thyroid  gland    nromal function   GERD (gastroesophageal reflux disease)    Migraine    Obesity    Ovarian cyst    Vaginal Pap smear, abnormal    ok now     Assessment: 43 y/o female with hx of migraine, obesity, anxiety, and GERD presents to the ED for LEFT upper abdominal pain. Pain is recurrent and has been since her internal hernia surgery in December. Subsequently readmitted for postoperative ileus and pulmonary embolus. Pharmacy to dose heparin  drip. LD of eliquis  2/3 evening. aPTT therapeutic at 87 with plans for endoscopy at 0800 on 2/6. Will DC heparin  at 0300 on 2/6. Communicated plan with RN as well.   Goal of  Therapy:  Heparin  level 0.3-0.7 units/ml aPTT 66-102 seconds (currently following aPTT until heparin  levels correlate)  Monitor platelets by anticoagulation protocol: Yes   Plan:  Continue heparin  drip at 1200 units/hr Stop heparin  gtt at 0300 on 2/6 prior to 0800 endoscopy  Will follow anticoag plans after endoscopy  Will continue to monitor for s/sx of bleeding  Glenys Bidding, PharmD, BCPPS 01/23/2024 3:40 PM

## 2024-01-24 ENCOUNTER — Ambulatory Visit (HOSPITAL_COMMUNITY): Admission: RE | Admit: 2024-01-24 | Payer: Commercial Managed Care - PPO | Source: Ambulatory Visit

## 2024-01-24 ENCOUNTER — Encounter (HOSPITAL_COMMUNITY): Payer: Self-pay | Admitting: Internal Medicine

## 2024-01-24 ENCOUNTER — Encounter (HOSPITAL_COMMUNITY): Payer: Self-pay

## 2024-01-24 ENCOUNTER — Inpatient Hospital Stay (HOSPITAL_COMMUNITY): Payer: Commercial Managed Care - PPO | Admitting: Anesthesiology

## 2024-01-24 ENCOUNTER — Encounter (HOSPITAL_COMMUNITY)
Admission: EM | Disposition: A | Discharge status: Home / Self Care | Payer: Self-pay | Source: Home / Self Care | Attending: Internal Medicine

## 2024-01-24 DIAGNOSIS — R7401 Elevation of levels of liver transaminase levels: Secondary | ICD-10-CM | POA: Diagnosis not present

## 2024-01-24 DIAGNOSIS — K9589 Other complications of other bariatric procedure: Secondary | ICD-10-CM | POA: Diagnosis not present

## 2024-01-24 DIAGNOSIS — R933 Abnormal findings on diagnostic imaging of other parts of digestive tract: Secondary | ICD-10-CM

## 2024-01-24 DIAGNOSIS — R1013 Epigastric pain: Secondary | ICD-10-CM

## 2024-01-24 DIAGNOSIS — R112 Nausea with vomiting, unspecified: Secondary | ICD-10-CM

## 2024-01-24 DIAGNOSIS — R1010 Upper abdominal pain, unspecified: Secondary | ICD-10-CM | POA: Diagnosis not present

## 2024-01-24 DIAGNOSIS — Z9884 Bariatric surgery status: Secondary | ICD-10-CM

## 2024-01-24 HISTORY — PX: ESOPHAGOGASTRODUODENOSCOPY (EGD) WITH PROPOFOL: SHX5813

## 2024-01-24 LAB — CBC
HCT: 30.9 % — ABNORMAL LOW (ref 36.0–46.0)
Hemoglobin: 9.8 g/dL — ABNORMAL LOW (ref 12.0–15.0)
MCH: 29.2 pg (ref 26.0–34.0)
MCHC: 31.7 g/dL (ref 30.0–36.0)
MCV: 92 fL (ref 80.0–100.0)
Platelets: 270 10*3/uL (ref 150–400)
RBC: 3.36 MIL/uL — ABNORMAL LOW (ref 3.87–5.11)
RDW: 13.6 % (ref 11.5–15.5)
WBC: 3.7 10*3/uL — ABNORMAL LOW (ref 4.0–10.5)
nRBC: 0 % (ref 0.0–0.2)

## 2024-01-24 LAB — COMPREHENSIVE METABOLIC PANEL
ALT: 54 U/L — ABNORMAL HIGH (ref 0–44)
AST: 52 U/L — ABNORMAL HIGH (ref 15–41)
Albumin: 3 g/dL — ABNORMAL LOW (ref 3.5–5.0)
Alkaline Phosphatase: 173 U/L — ABNORMAL HIGH (ref 38–126)
Anion gap: 9 (ref 5–15)
BUN: <5 mg/dL — ABNORMAL LOW (ref 6–20)
CO2: 27 mmol/L (ref 22–32)
Calcium: 9 mg/dL (ref 8.9–10.3)
Chloride: 104 mmol/L (ref 98–111)
Creatinine, Ser: 0.74 mg/dL (ref 0.44–1.00)
GFR, Estimated: >60 mL/min (ref >60)
Glucose, Bld: 123 mg/dL — ABNORMAL HIGH (ref 70–99)
Potassium: 3.6 mmol/L (ref 3.5–5.1)
Sodium: 140 mmol/L (ref 135–145)
Total Bilirubin: 0.4 mg/dL (ref 0.0–1.2)
Total Protein: 6.1 g/dL — ABNORMAL LOW (ref 6.5–8.1)

## 2024-01-24 LAB — PROCALCITONIN: Procalcitonin: <0.1 ng/mL

## 2024-01-24 LAB — HEMOGLOBIN AND HEMATOCRIT, BLOOD
HCT: 31.2 % — ABNORMAL LOW (ref 36.0–46.0)
Hemoglobin: 9.9 g/dL — ABNORMAL LOW (ref 12.0–15.0)

## 2024-01-24 LAB — OCCULT BLOOD X 1 CARD TO LAB, STOOL: Fecal Occult Bld: POSITIVE — AB

## 2024-01-24 SURGERY — ESOPHAGOGASTRODUODENOSCOPY (EGD) WITH PROPOFOL
Anesthesia: Monitor Anesthesia Care

## 2024-01-24 MED ORDER — HEPARIN (PORCINE) 25000 UT/250ML-% IV SOLN
1200.0000 [IU]/h | INTRAVENOUS | Status: DC
Start: 2024-01-25 — End: 2024-01-26
  Administered 2024-01-25 (×2): 1200 [IU]/h via INTRAVENOUS
  Filled 2024-01-24 (×2): qty 250

## 2024-01-24 MED ORDER — APIXABAN 5 MG PO TABS
5.0000 mg | ORAL_TABLET | Freq: Two times a day (BID) | ORAL | Status: DC
  Administered 2024-01-24: 5 mg via ORAL
  Filled 2024-01-24 (×2): qty 1

## 2024-01-24 MED ORDER — KETOROLAC TROMETHAMINE 15 MG/ML IJ SOLN
INTRAMUSCULAR | Status: DC | PRN
  Administered 2024-01-24: 30 mg via INTRAVENOUS

## 2024-01-24 MED ORDER — BUTALBITAL-APAP-CAFFEINE 50-325-40 MG PO TABS
1.0000 | ORAL_TABLET | Freq: Once | ORAL | Status: AC | PRN
  Administered 2024-01-24: 1 via ORAL
  Filled 2024-01-24: qty 1

## 2024-01-24 MED ORDER — SODIUM CHLORIDE 0.9 % IV SOLN: INTRAVENOUS | Status: DC | PRN

## 2024-01-24 MED ORDER — BUTALBITAL-APAP-CAFFEINE 50-325-40 MG PO TABS
2.0000 | ORAL_TABLET | Freq: Four times a day (QID) | ORAL | Status: AC | PRN
  Administered 2024-01-24: 2 via ORAL
  Filled 2024-01-24: qty 2

## 2024-01-24 MED ORDER — LACTATED RINGERS IV SOLN: INTRAVENOUS | Status: AC

## 2024-01-24 MED ORDER — PROPOFOL 10 MG/ML IV BOLUS
INTRAVENOUS | Status: DC | PRN
  Administered 2024-01-24: 50 mg via INTRAVENOUS
  Administered 2024-01-24 (×2): 30 mg via INTRAVENOUS
  Administered 2024-01-24 (×2): 50 mg via INTRAVENOUS
  Administered 2024-01-24: 40 mg via INTRAVENOUS
  Administered 2024-01-24: 20 mg via INTRAVENOUS

## 2024-01-24 MED ORDER — ACETAMINOPHEN 500 MG PO TABS
1000.0000 mg | ORAL_TABLET | Freq: Four times a day (QID) | ORAL | Status: DC | PRN
  Filled 2024-01-24: qty 2

## 2024-01-24 MED ORDER — LIDOCAINE 2% (20 MG/ML) 5 ML SYRINGE
INTRAMUSCULAR | Status: DC | PRN
  Administered 2024-01-24: 40 mg via INTRAVENOUS

## 2024-01-24 MED ORDER — HEPARIN (PORCINE) 25000 UT/250ML-% IV SOLN
1200.0000 [IU]/h | INTRAVENOUS | Status: AC
  Administered 2024-01-24: 1200 [IU]/h via INTRAVENOUS
  Filled 2024-01-24: qty 250

## 2024-01-24 SURGICAL SUPPLY — 14 items

## 2024-01-24 NOTE — Progress Notes (Signed)
 [facility] PROGRESS NOTE    Progress Note  Jessica Banks  FMW:[id number] DOB: 02-05-1981 DOA: 01/22/2024 PCP: Jarold Medici, MD     Brief Narrative:   Jessica Banks is an 43 y.o. female past medical history or recent surgery for internal hernia in December 2024, following this she presented and admitted for PE on Eliquis , with a history of Roux-en-Y and gastric sleeve surgery, history of cholecystectomy on December 2024 history of ADHD comes in for abdominal pain that has been going on for a month, had a visit in the ED on 01/16/2024 CT scan of the abdomen pelvis showed lesion in the pancreatic tail.  Was recommended to follow-up with general surgery, due to worsening abdominal pain came into the hospital mostly left upper quadrant denies nausea vomiting diarrhea fever and chills.  In the ED was found to elevated AST and Alt 90/47 bilirubin 0.6 alkaline phosphatase of 181, white blood cell count of 13, and a potassium of 3.2.  General surgery was consulted and recommended a GI consult.   Assessment/Plan:   New left flank intractable Abdominal pain: CT scan of the abdomen pelvis showed lesion in the pancreatic tail measuring 1 cm, small volume ascites. She relates she had a normal bowel movement the day prior to admission. Abdominal series showed no evidence of bowel obstruction free air.  Lipase is unremarkable General surgery has been consulted, agree with x-ray and to hold on an MRCP.  The lesion of the pancreas is unlikely to be the source of her new complaint. They recommended an UGI and GI consult -GI consult -- s/p EGD: normal esophagus/pouch -added incentive spirometry -pro calcitonin pending   Elevated LFTs: Elevated alkaline phosphatase, mildly elevated LFTs unremarkable bilirubin. -trending down -? MRCP inpt vs outpatient   History of PE on Eliquis : -change back to PO meds as EGD done  Elevated blood pressure without a diagnosis of essential  hypertension: Continue hydralazine  IV as needed.  Hyperglycemia: -A1c: 5.6  Hypokalemia: -replete  History of DDD: -Continue Adderall .  History of depression:  -Continue Cymbalta .  Constipation -bowel regimen    DVT prophylaxis: scd Family Communication:mother   IV Access:   Peripheral IV   Procedures and diagnostic studies:   CT ABDOMEN PELVIS W CONTRAST Result Date: 01/23/2024 CLINICAL DATA:  Postoperative abdominal pain. EXAM: CT ABDOMEN AND PELVIS WITH CONTRAST TECHNIQUE: Multidetector CT imaging of the abdomen and pelvis was performed using the standard protocol following bolus administration of intravenous contrast. RADIATION DOSE REDUCTION: This exam was performed according to the departmental dose-optimization program which includes automated exposure control, adjustment of the mA and/or kV according to patient size and/or use of iterative reconstruction technique. CONTRAST:  75mL OMNIPAQUE  IOHEXOL  350 MG/ML SOLN, 30mL OMNIPAQUE  IOHEXOL  300 MG/ML SOLN COMPARISON:  01/16/2024 FINDINGS: Lower chest: Patchy opacities in the left lung base could be due to motion artifact or pneumonia. Hepatobiliary: No focal liver abnormality is seen. Status post cholecystectomy. No biliary dilatation. Pancreas: Unremarkable. No pancreatic ductal dilatation or surrounding inflammatory changes. Pancreatic tail lesion seen on prior studies is not well visualized today, likely due to streak artifact. Spleen: Normal in size without focal abnormality. Adrenals/Urinary Tract: Adrenal glands are unremarkable. Kidneys are normal, without renal calculi, focal lesion, or hydronephrosis. Bladder is unremarkable. Stomach/Bowel: Postoperative changes consistent with gastric bypass. Stomach, small bowel, and colon are not abnormally distended. Residual contrast material is demonstrated in the small bowel and throughout the colon. Dense contrast material results in streak artifact, limiting the examination.  Appendix is not identified. Vascular/Lymphatic: No significant vascular findings are present. No enlarged abdominal or pelvic lymph nodes. Reproductive: Uterus and bilateral adnexa are unremarkable. Other: Small amount of free fluid in the abdomen and pelvis consistent with ascites. Similar volume to previous study. No free air. Abdominal wall musculature appears intact. Musculoskeletal: No acute or significant osseous findings. IMPRESSION: 1. Postoperative changes consistent with gastric bypass. No evidence of bowel obstruction. Large amount of residual contrast material throughout the colon and in the small bowel. Residual contrast material limits examination. 2. Suggestion of patchy infiltrates in the left lung base. This may be due to motion artifact or pneumonia. 3. Small amount of ascites, similar to prior study. Electronically Signed   By: Elsie Gravely M.D.   On: 01/23/2024 19:46   DG Abd Portable 1V Result Date: 01/23/2024 CLINICAL DATA:  Nausea, vomiting. EXAM: PORTABLE ABDOMEN - 1 VIEW COMPARISON:  January 22, 2024. FINDINGS: The bowel gas pattern is normal. Contrast is noted in nondilated colon. No radio-opaque calculi or other significant radiographic abnormality are seen. IMPRESSION: No abnormal bowel dilatation. Electronically Signed   By: Lynwood Landy Raddle M.D.   On: 01/23/2024 15:01     Medical Consultants:   [facility]   Subjective:    No abdominal pain today-- just migraine  Objective:    Vitals:   01/24/24 0835 01/24/24 0840 01/24/24 0845 01/24/24 0913  BP: 128/70  123/86 115/68  Pulse: 80 79 82 67  Resp: 18 18 17 16   Temp:    98 F (36.7 C)  TempSrc:    Oral  SpO2: 98% 99% 98% 100%  Weight:      Height:       SpO2: 100 %   Intake/Output Summary (Last 24 hours) at 01/24/2024 1241 Last data filed at 01/24/2024 9088 Gross per 24 hour  Intake 2066.29 ml  Output 2475 ml  Net -408.71 ml   Filed Weights   01/22/24 1607  Weight: 85.5 kg    Exam:  General:  Appearance:    Obese female in no acute distress     Lungs:     respirations unlabored  Heart:    Normal heart rate. .   MS:   All extremities are intact.   Neurologic:   Awake, alert      Data Reviewed:    Labs: Basic Metabolic Panel: Recent Labs  Lab 01/22/24 0345 01/22/24 0548 01/24/24 1104  NA 138 137 140  K 3.2* 3.8 3.6  CL 103 104 104  CO2 23 22 27   GLUCOSE 219* 194* 123*  BUN 13 13 <5*  CREATININE 0.46 0.69 0.74  CALCIUM  9.1 9.0 9.0   GFR Estimated Creatinine Clearance: 89.8 mL/min (by C-G formula based on SCr of 0.74 mg/dL). Liver Function Tests: Recent Labs  Lab 01/22/24 0345 01/22/24 0548 01/23/24 1453 01/24/24 1104  AST 90* 82* 87* 52*  ALT 47* 42 79* 54*  ALKPHOS 181* 186* 191* 173*  BILITOT 0.6 0.6 0.5 0.4  PROT 7.6 7.5 6.6 6.1*  ALBUMIN  3.9 3.6 3.4* 3.0*   Recent Labs  Lab 01/22/24 0345  LIPASE 23   No results for input(s): AMMONIA in the last 168 hours. Coagulation profile No results for input(s): INR, PROTIME in the last 168 hours. COVID-19 Labs  No results for input(s): DDIMER, FERRITIN, LDH, CRP in the last 72 hours.  Lab Results  Component Value Date   SARSCOV2NAA NEGATIVE 12/06/2020    CBC: Recent Labs  Lab 01/22/24 0225 01/22/24 0548  01/23/24 0513 01/24/24 1104  WBC 7.6 12.8* 6.3 3.7*  NEUTROABS  --  10.2*  --   --   HGB 12.5 13.7 10.4* 9.8*  HCT 40.9 45.1 32.3* 30.9*  MCV 94.7 94.0 91.8 92.0  PLT 334 382 286 270   Cardiac Enzymes: No results for input(s): CKTOTAL, CKMB, CKMBINDEX, TROPONINI in the last 168 hours. BNP (last 3 results) No results for input(s): PROBNP in the last 8760 hours. CBG: Recent Labs  Lab 01/23/24 1349  GLUCAP 104*   D-Dimer: No results for input(s): DDIMER in the last 72 hours. Hgb A1c: Recent Labs    01/22/24 0548  HGBA1C 5.6   Lipid Profile: No results for input(s): CHOL, HDL, LDLCALC, TRIG, CHOLHDL, LDLDIRECT in the last 72 hours. Thyroid   function studies: No results for input(s): TSH, T4TOTAL, T3FREE, THYROIDAB in the last 72 hours.  Invalid input(s): FREET3 Anemia work up: No results for input(s): VITAMINB12, FOLATE, FERRITIN, TIBC, IRON , RETICCTPCT in the last 72 hours. Sepsis Labs: Recent Labs  Lab 01/22/24 0225 01/22/24 0226 01/22/24 0548 01/23/24 0513 01/24/24 1104  WBC 7.6  --  12.8* 6.3 3.7*  LATICACIDVEN  --  1.7  --   --   --    Microbiology No results found for this or any previous visit (from the past 240 hours).   Medications:    amphetamine -dextroamphetamine   30 mg Oral Daily   DULoxetine   60 mg Oral Daily   pantoprazole  (PROTONIX ) IV  40 mg Intravenous Q12H   senna  1 tablet Oral Daily   temazepam   15 mg Oral QHS   Continuous Infusions:  heparin  1,200 Units/hr (01/24/24 1025)   lactated ringers         LOS: 2 days   Teague Goynes U Altheia Shafran  [facility]  01/24/2024, 12:41 PM

## 2024-01-24 NOTE — Progress Notes (Signed)
 PHARMACY - ANTICOAGULATION CONSULT NOTE  Pharmacy Consult for Heparin  Indication: pulmonary embolus  Allergies  Allergen Reactions   Novocain [Procaine] Anaphylaxis   Nsaids Other (See Comments)    H/o gastric bypass = avoid NSAIDs   Povidone Iodine Rash    Rash    Patient Measurements: Height: 5' (152.4 cm) Weight: 85.5 kg (188 lb 9.6 oz) IBW/kg (Calculated) : 45.5 kg Heparin  Dosing Weight: 83 kg  Vital Signs: Temp: 98 F (36.7 C) (02/06 0913) Temp Source: Oral (02/06 0913) BP: 115/68 (02/06 0913) Pulse Rate: 67 (02/06 0913)  Labs: Recent Labs    01/22/24 0225 01/22/24 0345 01/22/24 0548 01/22/24 1324 01/22/24 1324 01/22/24 1936 01/23/24 0513 01/23/24 1453  HGB 12.5  --  13.7  --   --   --  10.4*  --   HCT 40.9  --  45.1  --   --   --  32.3*  --   PLT 334  --  382  --   --   --  286  --   APTT  --   --   --  59*   < > 79* 109* 87*  HEPARINUNFRC  --   --   --  0.79*  --  1.04* 1.05*  --   CREATININE  --  0.46 0.69  --   --   --   --   --    < > = values in this interval not displayed.    Estimated Creatinine Clearance: 89.8 mL/min (by C-G formula based on SCr of 0.69 mg/dL).   Medical History: Past Medical History:  Diagnosis Date   Abnormal Pap smear    CIN I   Anemia    Anxiety    Depression    off meds now, ok now   Enlarged thyroid  gland    nromal function   GERD (gastroesophageal reflux disease)    Migraine    Obesity    Ovarian cyst    Vaginal Pap smear, abnormal    ok now    Assessment: 43 yo with hx of PE.  Last eliquis  dose was 2/3 evening.  Heparin  was started on admission 2/4.  Pt is now s/p endoscopy with no biopsy.  Will restart heparin  at 1200 units/hr and check aPTT and heparin  level in 6 hours.  Goal of Therapy:  Heparin  level 0.3-0.7 units/ml aPTT 66-102 seconds Monitor platelets by anticoagulation protocol: Yes   Plan:  Restart heparin  drip at 1200 units/hr Heparin  level and aPTT in 6 hours Daily CBC and heparin  level  while on heparin  drip.  Neill Clarity 01/24/2024,10:03 AM

## 2024-01-24 NOTE — Progress Notes (Signed)
   Patient ID: Jessica Banks, female   DOB: 11/18/1981, 43 y.o.   MRN: [id number] Another ct negative.  Await egd. Dont think any real surgical intervention for her for pain.  May need to see weight loss surgery as outpatient

## 2024-01-24 NOTE — Transfer of Care (Signed)
 Immediate Anesthesia Transfer of Care Note  Patient: Lucie DELENA Pouch  Procedure(s) Performed: ESOPHAGOGASTRODUODENOSCOPY (EGD) WITH PROPOFOL   Patient Location: PACU and Endoscopy Unit  Anesthesia Type:MAC  Level of Consciousness: oriented, drowsy, and patient cooperative  Airway & Oxygen Therapy: Patient Spontanous Breathing and Patient connected to nasal cannula oxygen  Post-op Assessment: Report given to RN, Post -op Vital signs reviewed and stable, and Patient moving all extremities X 4  Post vital signs: Reviewed and stable  Last Vitals:  Vitals Value Taken Time  BP 113/69 01/24/24 0830  Temp 97.2   Pulse 93 01/24/24 0831  Resp 21 01/24/24 0831  SpO2 99 % 01/24/24 0831  Vitals shown include unfiled device data.  Last Pain:  Vitals:   01/24/24 0826  TempSrc:   PainSc: Asleep      Patients Stated Pain Goal: 4 (01/23/24 0834)  Complications: No notable events documented.

## 2024-01-24 NOTE — Anesthesia Postprocedure Evaluation (Signed)
 Anesthesia Post Note  Patient: Jessica Banks  Procedure(s) Performed: ESOPHAGOGASTRODUODENOSCOPY (EGD) WITH PROPOFOL      Patient location during evaluation: PACU Anesthesia Type: MAC Level of consciousness: awake and alert Pain management: pain level controlled Vital Signs Assessment: post-procedure vital signs reviewed and stable Respiratory status: spontaneous breathing, nonlabored ventilation and respiratory function stable Cardiovascular status: blood pressure returned to baseline and stable Postop Assessment: no apparent nausea or vomiting Anesthetic complications: no   No notable events documented.  Last Vitals:  Vitals:   01/24/24 0845 01/24/24 0913  BP: 123/86 115/68  Pulse: 82 67  Resp: 17 16  Temp:  36.7 C  SpO2: 98%     Last Pain:  Vitals:   01/24/24 0913  TempSrc: Oral  PainSc:                  Butler Levander Pinal

## 2024-01-24 NOTE — Progress Notes (Signed)
 PHARMACY - ANTICOAGULATION CONSULT NOTE  Pharmacy Consult for Eliquis  Indication: pulmonary embolus  Allergies  Allergen Reactions   Novocain [Procaine] Anaphylaxis   Nsaids Other (See Comments)    H/o gastric bypass = avoid NSAIDs   Povidone Iodine Rash    Rash    Patient Measurements: Height: 5' (152.4 cm) Weight: 85.5 kg (188 lb 9.6 oz) IBW/kg (Calculated) : 45.5  Vital Signs: Temp: 98 F (36.7 C) (02/06 0913) Temp Source: Oral (02/06 0913) BP: 115/68 (02/06 0913) Pulse Rate: 67 (02/06 0913)  Labs: Recent Labs    01/22/24 0345 01/22/24 0548 01/22/24 1324 01/22/24 1324 01/22/24 1936 01/23/24 0513 01/23/24 1453 01/24/24 1104  HGB  --  13.7  --   --   --  10.4*  --  9.8*  HCT  --  45.1  --   --   --  32.3*  --  30.9*  PLT  --  382  --   --   --  286  --  270  APTT  --   --  59*   < > 79* 109* 87*  --   HEPARINUNFRC  --   --  0.79*  --  1.04* 1.05*  --   --   CREATININE 0.46 0.69  --   --   --   --   --  0.74   < > = values in this interval not displayed.  AST/ALT 52/54  Estimated Creatinine Clearance: 89.8 mL/min (by C-G formula based on SCr of 0.74 mg/dL).   Medical History: Past Medical History:  Diagnosis Date   Abnormal Pap smear    CIN I   Anemia    Anxiety    Depression    off meds now, ok now   Enlarged thyroid  gland    nromal function   GERD (gastroesophageal reflux disease)    Migraine    Obesity    Ovarian cyst    Vaginal Pap smear, abnormal    ok now    Assessment: Changing from heparin  drip to eliquis  s/p work up for abdominal pain.  Plan:  Eliquis  5 mg po BID (PTA). Stop heparin  drip when give first eliquis  dose.  Neill Clarity 01/24/2024,1:32 PM

## 2024-01-24 NOTE — H&P (Signed)
 [facility] History and Physical   Primary Care Physician:  Jarold Medici, MD   Reason for Procedure:  Abdominal pain, nausea  Plan:               Diagnostic upper endoscopy     HPI: Jessica Banks is a 43 y.o. female with a complex past medical history noteworthy for remote gastric sleeve, Roux-en-Y and hiatal hernia repair admitted to the hospital with episodes of severe midepigastric/left upper quadrant abdominal pain and nausea.  Abdominal CT and upper GI series have not yielded an etiology for her pain episodes.  Upper GI series showed esophageal dysmotility but patient is denying symptoms of dysphagia.  EGD is performed today to evaluate mucosa of the upper GI tract, assess for reflux, assess gastric pouch and status of Roux-en-Y  Patient has been on a heparin  drip which was discontinued at 3 AM this morning  Past Medical History:  Diagnosis Date   Abnormal Pap smear    CIN I   Anemia    Anxiety    Depression    off meds now, ok now   Enlarged thyroid  gland    nromal function   GERD (gastroesophageal reflux disease)    Migraine    Obesity    Ovarian cyst    Vaginal Pap smear, abnormal    ok now    Past Surgical History:  Procedure Laterality Date   GASTRIC BYPASS  08/14/2022   Revision of sleeve/hiatal hernia repair   LAPAROSCOPIC GASTRIC SLEEVE RESECTION  2017   LAPAROSCOPY N/A 12/05/2023   Procedure: LAPAROSCOPY DIAGNOSTIC, CHOLECYSTECOMY, INTERNAL HERNIA REPAIR;  Surgeon: Dasie Leonor CROME, MD;  Location: [facility];  Service: General;  Laterality: N/A;    Prior to Admission medications   Medication Sig Start Date End Date Taking? Authorizing Provider  acetaminophen  (TYLENOL ) 500 MG tablet Take 2 tablets (1,000 mg total) by mouth every 6 (six) hours as needed. Patient taking differently: Take 1,000 mg by mouth every 6 (six) hours as needed for mild pain (pain score 1-3) or moderate pain (pain score 4-6). 12/07/23  Yes Tammy Sor, PA-C   amphetamine -dextroamphetamine  (ADDERALL  XR) 30 MG 24 hr capsule Take 30 mg by mouth daily.   Yes [provider]  apixaban  (ELIQUIS ) 5 MG TABS tablet Take 1 tablet (5 mg total) by mouth 2 (two) times daily. 01/21/24 04/20/24 Yes Ghimire, Kuber, MD  butalbital -acetaminophen -caffeine  (FIORICET ) 50-325-40 MG tablet Take 1 tablet by mouth 2 (two) times daily as needed. Patient taking differently: Take 2 tablets by mouth daily as needed for headache or migraine. 11/19/23  Yes Penumalli, Vikram R, MD  cyclobenzaprine  (FLEXERIL ) 10 MG tablet Take 1 tablet (10 mg total) by mouth daily as needed. Patient taking differently: Take 10 mg by mouth daily as needed for muscle spasms. 11/19/23  Yes Penumalli, Eduard SAUNDERS, MD  DULoxetine  (CYMBALTA ) 60 MG capsule Take 1 capsule (60 mg total) by mouth daily. 11/19/23  Yes Penumalli, Vikram R, MD  Ferrous Sulfate  (IRON  PO) Take 1 tablet by mouth at bedtime.   Yes [provider]  Galcanezumab -gnlm (EMGALITY ) 120 MG/ML SOAJ Inject 1 Pen into the skin every 30 (thirty) days. 11/19/23  Yes Penumalli, Vikram R, MD  Melatonin 10 MG CAPS Take 10 mg by mouth at bedtime.   Yes [provider]  Multiple Vitamin (MULTIVITAMIN) tablet Take 1 tablet by mouth at bedtime.   Yes [provider]  oxyCODONE  (OXY IR/ROXICODONE ) 5 MG immediate release tablet Take 5 mg by mouth  daily as needed for moderate pain (pain score 4-6) or severe pain (pain score 7-10). 01/16/24 01/26/24 Yes [provider]  Probiotic Product (FORTIFY PROBIOTIC WOMENS PO) Take 1 capsule by mouth at bedtime.   Yes [provider]  Rimegepant Sulfate (NURTEC) 75 MG TBDP Take 1 tablet (75 mg total) by mouth daily as needed. Patient taking differently: Take 75 mg by mouth daily as needed (migraine). 11/19/23  Yes Penumalli, Vikram R, MD  temazepam  (RESTORIL ) 15 MG capsule TAKE 1 CAPSULE BY MOUTH AT BEDTIME AS NEEDED FOR SLEEP Patient taking differently: Take 15 mg by mouth at  bedtime. 10/17/23  Yes Jarold Medici, MD  VITAMIN D  PO Take 1 capsule by mouth at bedtime.   Yes [provider]  gabapentin  (NEURONTIN ) 300 MG capsule Take by mouth. Patient not taking: Reported on 01/22/2024 01/02/24   [provider]  oxyCODONE -acetaminophen  (PERCOCET/ROXICET) 5-325 MG tablet Take 1 tablet by mouth every 8 (eight) hours as needed for severe pain (pain score 7-10) or moderate pain (pain score 4-6). Patient not taking: Reported on 01/22/2024 12/16/23   [name], MD    Current Facility-Administered Medications  Medication Dose Route Frequency Provider Last Rate Last Admin   [MAR Hold] amphetamine -dextroamphetamine  (ADDERALL  XR) 24 hr capsule 30 mg  30 mg Oral Daily Vann, Jessica U, DO   30 mg at 01/23/24 1126   [MAR Hold] diphenhydrAMINE  (BENADRYL ) capsule 25 mg  25 mg Oral Q8H PRN Vann, Jessica U, DO   25 mg at 01/23/24 1126   [MAR Hold] DULoxetine  (CYMBALTA ) DR capsule 60 mg  60 mg Oral Daily Vann, Jessica U, DO   60 mg at 01/23/24 1126   [MAR Hold] hydrALAZINE  (APRESOLINE ) injection 5 mg  5 mg Intravenous Q4H PRN Kakrakandy, Arshad N, MD       [facility] Hold] HYDROmorphone  (DILAUDID ) injection 1 mg  1 mg Intravenous Q3H PRN Vann, Jessica U, DO   1 mg at 01/24/24 9479   lactated ringers  infusion   Intravenous Continuous Vann, Jessica U, DO   Stopped at 01/24/24 0720   [MAR Hold] ondansetron  (ZOFRAN ) injection 4 mg  4 mg Intravenous Q6H PRN Kakrakandy, Arshad N, MD   4 mg at 01/23/24 1315   [MAR Hold] pantoprazole  (PROTONIX ) injection 40 mg  40 mg Intravenous Q12H Odell Celinda Balo, MD   40 mg at 01/23/24 2141   [MAR Hold] senna (SENOKOT) tablet 8.6 mg  1 tablet Oral Daily Vann, Jessica U, DO   8.6 mg at 01/23/24 1126   [MAR Hold] temazepam  (RESTORIL ) capsule 15 mg  15 mg Oral QHS Vann, Jessica U, DO   15 mg at 01/23/24 2148    Allergies as of 01/22/2024 - Review Complete 01/22/2024  Allergen Reaction Noted   Novocain [procaine] Anaphylaxis 07/17/2013    Nsaids Other (See Comments) 12/15/2023   Povidone iodine Rash 09/05/2021    Family History  Problem Relation Age of Onset   Thyroid  disease Mother        s/p thyroidectomy   Arthritis Mother    Leukemia Maternal Grandmother    Stroke Maternal Grandmother    Cancer Maternal Grandfather        lung   Heart disease Paternal Grandmother    COPD Paternal Grandfather    Hypertension Maternal Uncle    Diabetes Maternal Uncle     Social History   Socioeconomic History   Marital status: Married    Spouse name: n/a   Number of children: 0   Years  of education: master's   Highest education level: Master's degree (e.g., MA, MS, MEng, MEd, MSW, MBA)  Occupational History   Occupation: RN    Comment: [employer]   Occupation: NP    Comment: graduated/boards spring 2014  Tobacco Use   Smoking status: Former    Current packs/day: 0.00    Types: Cigarettes    Start date: 06/07/2009    Quit date: 06/07/2013    Years since quitting: 10.6   Smokeless tobacco: Never   Tobacco comments:    only smoked on the weekends  Vaping Use   Vaping status: Never Used  Substance and Sexual Activity   Alcohol use: Not Currently    Comment: SOCIAL    Drug use: No   Sexual activity: Not Currently  Other Topics Concern   Not on file  Social History Narrative   Not on file   Social Drivers of Health   Financial Resource Strain: Low Risk  (11/19/2023)   Overall Financial Resource Strain (CARDIA)    Difficulty of Paying Living Expenses: Not hard at all  Food Insecurity: No Food Insecurity (01/22/2024)   Hunger Vital Sign    Worried About Running Out of Food in the Last Year: Never true    Ran Out of Food in the Last Year: Never true  Transportation Needs: No Transportation Needs (01/22/2024)   PRAPARE - Administrator, Civil Service (Medical): No    Lack of Transportation (Non-Medical): No  Physical Activity: Sufficiently Active (11/19/2023)   Exercise Vital Sign    Days of  Exercise per Week: 4 days    Minutes of Exercise per Session: 60 min  Stress: No Stress Concern Present (11/19/2023)   [employer] of Occupational Health - Occupational Stress Questionnaire    Feeling of Stress : Not at all  Social Connections: Socially Integrated (11/19/2023)   Social Connection and Isolation Panel [NHANES]    Frequency of Communication with Friends and Family: More than three times a week    Frequency of Social Gatherings with Friends and Family: Once a week    Attends Religious Services: More than 4 times per year    Active Member of [employer] or Organizations: Yes    Attends Engineer, Structural: More than 4 times per year    Marital Status: Married  Catering Manager Violence: Not At Risk (01/22/2024)   Humiliation, Afraid, Rape, and Kick questionnaire    Fear of Current or Ex-Partner: No    Emotionally Abused: No    Physically Abused: No    Sexually Abused: No    Review of Systems:  All other review of systems negative except as mentioned in the HPI.  Physical Exam: Vital signs BP (!) 119/59 (BP Location: Left Arm)   Pulse 79   Temp 97.9 F (36.6 C) (Oral)   Resp 16   Ht 5' (1.524 m)   Wt 85.5 kg   SpO2 98%   BMI 36.83 kg/m   General:   Alert,  Well-developed, well-nourished, pleasant and cooperative in NAD Lungs:  Clear throughout to auscultation.   Heart:  Regular rate and rhythm; no murmurs, clicks, rubs,  or gallops. Abdomen:  Soft, tender to palpation in the left upper quadrant/epigastrium without rebound or guarding, quiet bowel sounds Neuro/Psych:  Normal mood and affect. A and O x 3  Lab Results  Component Value Date   WBC 6.3 01/23/2024   HGB 10.4 (L) 01/23/2024   HCT 32.3 (L) 01/23/2024  MCV 91.8 01/23/2024   PLT 286 01/23/2024   Lab Results  Component Value Date   NA 137 01/22/2024   K 3.8 01/22/2024   CL 104 01/22/2024   CO2 22 01/22/2024   Lab Results  Component Value Date   ALT 79 (H) 01/23/2024   AST 87 (H)  01/23/2024   GGT 11 03/03/2021   ALKPHOS 191 (H) 01/23/2024   BILITOT 0.5 01/23/2024     Inocente Hausen, MD [facility]

## 2024-01-24 NOTE — Progress Notes (Signed)
 PHARMACY - ANTICOAGULATION CONSULT NOTE  Pharmacy Consult for Heparin  Drip Indication: pulmonary embolus  Allergies  Allergen Reactions   Novocain [Procaine] Anaphylaxis   Nsaids Other (See Comments)    H/o gastric bypass = avoid NSAIDs   Povidone Iodine Rash    Rash    Patient Measurements: Height: 5' (152.4 cm) Weight: 85.5 kg (188 lb 9.6 oz) IBW/kg (Calculated) : 45.5 Heparin  Dosing Weight: 83kg  Vital Signs: Temp: 98.6 F (37 C) (02/06 1407) Temp Source: Oral (02/06 1407) BP: 140/87 (02/06 1407) Pulse Rate: 107 (02/06 1407)  Labs: Recent Labs    01/22/24 0345 01/22/24 0548 01/22/24 1324 01/22/24 1324 01/22/24 1936 01/23/24 0513 01/23/24 1453 01/24/24 1104 01/24/24 1837  HGB  --  13.7  --   --   --  10.4*  --  9.8* 9.9*  HCT  --  45.1  --   --   --  32.3*  --  30.9* 31.2*  PLT  --  382  --   --   --  286  --  270  --   APTT  --   --  59*   < > 79* 109* 87*  --   --   HEPARINUNFRC  --   --  0.79*  --  1.04* 1.05*  --   --   --   CREATININE 0.46 0.69  --   --   --   --   --  0.74  --    < > = values in this interval not displayed.    Estimated Creatinine Clearance: 89.8 mL/min (by C-G formula based on SCr of 0.74 mg/dL).   Medical History: Past Medical History:  Diagnosis Date   Abnormal Pap smear    CIN I   Anemia    Anxiety    Depression    off meds now, ok now   Enlarged thyroid  gland    nromal function   GERD (gastroesophageal reflux disease)    Migraine    Obesity    Ovarian cyst    Vaginal Pap smear, abnormal    ok now    Assessment: 44yo F with complex history admitted on 01/22/24. Last Eliquis  dose for PE prior to admission was 2/3 evening with initiation of Heparin  drip upon admission. Pt is s/p endoscopy today with Heparin  restarted then transitioned to Eliquis . Pt received Eliquis  dose at 1432 today. MD requests pt be transitioned back to Heparin  drip due to heme + and possibility of colonoscopy. Goal of Therapy:  Heparin  level  0.3-0.7 units/ml aPTT 66-102 seconds Monitor platelets by anticoagulation protocol: Yes   Plan:  Restart the Heparin  drip at 0300 2/7 (12 hours after Eliquis  dose today). Will plan to check heparin  level/ aPTT 6 hours after initiation to target above goals.  Will continue to follow pt closely.  Clayborne Alfonso MATSU 01/24/2024,7:39 PM

## 2024-01-24 NOTE — Anesthesia Preprocedure Evaluation (Signed)
 Anesthesia Evaluation  Patient identified by MRN, date of birth, ID band Patient awake    Reviewed: Allergy & Precautions, NPO status , Patient's Chart, lab work & pertinent test results  Airway Mallampati: II  TM Distance: >3 FB Neck ROM: Full    Dental no notable dental hx. (+) Teeth Intact, Dental Advisory Given   Pulmonary former smoker   Pulmonary exam normal breath sounds clear to auscultation       Cardiovascular negative cardio ROS Normal cardiovascular exam Rhythm:Regular Rate:Normal     Neuro/Psych  Headaches PSYCHIATRIC DISORDERS Anxiety Depression       GI/Hepatic S/p Gastric Bypass   Endo/Other    Renal/GU      Musculoskeletal   Abdominal  (+) + obese  Peds  Hematology Lab Results      Component                Value               Date                      WBC                      7.5                 12/04/2023                HGB                      12.6                12/04/2023                HCT                      40.1                12/04/2023                MCV                      93.7                12/04/2023                PLT                      368                 12/04/2023              Anesthesia Other Findings   Reproductive/Obstetrics negative OB ROS                             Anesthesia Physical Anesthesia Plan  ASA: 2  Anesthesia Plan: MAC   Post-op Pain Management: Tylenol  PO (pre-op)* and Minimal or no pain anticipated   Induction: Intravenous  PONV Risk Score and Plan: 2 and Treatment may vary due to age or medical condition, Ondansetron  and Midazolam   Airway Management Planned: Nasal Cannula  Additional Equipment: None  Intra-op Plan:   Post-operative Plan:   Informed Consent: I have reviewed the patients History and Physical, chart, labs and discussed the procedure including the risks, benefits and alternatives for the proposed  anesthesia with the patient or authorized  representative who has indicated his/her understanding and acceptance.     Dental advisory given  Plan Discussed with: CRNA and Anesthesiologist  Anesthesia Plan Comments:         Anesthesia Quick Evaluation

## 2024-01-24 NOTE — Op Note (Signed)
 [facility] Patient Name: Jessica Banks Procedure Date : 01/24/2024 MRN: [id number] Attending MD: Inocente Hausen , MD, [id number] Date of Birth: 11/25/1981 CSN: [id number] Age: 43 Admit Type: Inpatient Procedure:                Upper GI endoscopy Indications:              Epigastric abdominal pain, Abdominal pain in the                            left upper quadrant, nausea, history of Roux-en-Y                            gastric bypass, upper GI series demonstrating                            esophageal dysmotility Providers:                Inocente Hausen, MD, Ozell Pouch, Coye Canada, Technician Referring MD:              Medicines:                Monitored Anesthesia Care Complications:            No immediate complications. Estimated blood loss:                            None. Estimated Blood Loss:     Estimated blood loss: none. Procedure:                Pre-Anesthesia Assessment:                           - Prior to the procedure, a History and Physical                            was performed, and patient medications and                            allergies were reviewed. The patient's tolerance of                            previous anesthesia was also reviewed. The risks                            and benefits of the procedure and the sedation                            options and risks were discussed with the patient.                            All questions were answered, and informed consent                            was obtained. Prior Anticoagulants: The  patient has                            taken Eliquis  (apixaban ), last dose was 3 days                            prior to procedure. The patient has been on a                            heparin  drip discontinued 5 hours prior to                            procedure. ASA Grade Assessment: II - A patient                            with mild systemic disease. After  reviewing the                            risks and benefits, the patient was deemed in                            satisfactory condition to undergo the procedure.                           After obtaining informed consent, the endoscope was                            passed under direct vision. Throughout the                            procedure, the patient's blood pressure, pulse, and                            oxygen saturations were monitored continuously. The                            GIF-H190 ([id number]) Olympus endoscope was introduced                            through the mouth, and advanced to the jejunum. The                            upper GI endoscopy was accomplished without                            difficulty. The patient tolerated the procedure                            well. Scope In: Scope Out: Findings:      The examined esophagus was normal. No evidence of esophagitis.      Evidence of previous surgery in the form of a gastric bypass in the       stomach. The gastric pouch overall appeared normal with healthy mucosa.  No evidence of ulceration. Retroflexion in the gastric pouch was normal.      A patent and healthy gastrojejunal anastomosis was present without       evidence of marginal ulceration. Both limbs of the jejunum were examined       and appeared normal. The maximal extent reached in the jejunum was 90 cm       from the incisors. Impression:               - Normal esophagus.                           - Gastric bypass with overall normal-appearing                            pouch, healthy GJ anastomosis and normal-appearing                            jejunal limbs                           - No specimens collected.                           - No findings identified on EGD to explain                            patient's symptoms of severe abdominal pain and                            nausea Recommendation:           - Return patient to hospital ward  for ongoing care.                           - Results discussed with patient Procedure Code(s):        --- Professional ---                           [phone], Esophagogastroduodenoscopy, flexible,                            transoral; diagnostic, including collection of                            specimen(s) by brushing or washing, when performed                            (separate procedure) Diagnosis Code(s):        --- Professional ---                           K95.89, Other complications of other bariatric                            procedure CPT copyright 2022 [employer]. All rights reserved. The codes documented in this report are preliminary and upon coder review may  be revised to meet current compliance requirements. Inocente Hausen,  MD 01/24/2024 8:34:39 AM This report has been signed electronically. Number of Addenda: 0

## 2024-01-25 DIAGNOSIS — R7401 Elevation of levels of liver transaminase levels: Secondary | ICD-10-CM | POA: Diagnosis not present

## 2024-01-25 DIAGNOSIS — R1010 Upper abdominal pain, unspecified: Secondary | ICD-10-CM | POA: Diagnosis not present

## 2024-01-25 LAB — IRON AND TIBC
Iron: 42 ug/dL (ref 28–170)
Saturation Ratios: 14 % (ref 10.4–31.8)
TIBC: 294 ug/dL (ref 250–450)
UIBC: 252 ug/dL

## 2024-01-25 LAB — COMPREHENSIVE METABOLIC PANEL
ALT: 49 U/L — ABNORMAL HIGH (ref 0–44)
AST: 47 U/L — ABNORMAL HIGH (ref 15–41)
Albumin: 2.8 g/dL — ABNORMAL LOW (ref 3.5–5.0)
Alkaline Phosphatase: 162 U/L — ABNORMAL HIGH (ref 38–126)
Anion gap: 9 (ref 5–15)
BUN: <5 mg/dL — ABNORMAL LOW (ref 6–20)
CO2: 28 mmol/L (ref 22–32)
Calcium: 8.7 mg/dL — ABNORMAL LOW (ref 8.9–10.3)
Chloride: 102 mmol/L (ref 98–111)
Creatinine, Ser: 0.61 mg/dL (ref 0.44–1.00)
GFR, Estimated: >60 mL/min (ref >60)
Glucose, Bld: 92 mg/dL (ref 70–99)
Potassium: 3.4 mmol/L — ABNORMAL LOW (ref 3.5–5.1)
Sodium: 139 mmol/L (ref 135–145)
Total Bilirubin: 0.3 mg/dL (ref 0.0–1.2)
Total Protein: 5.7 g/dL — ABNORMAL LOW (ref 6.5–8.1)

## 2024-01-25 LAB — CBC
HCT: 30.3 % — ABNORMAL LOW (ref 36.0–46.0)
Hemoglobin: 9.7 g/dL — ABNORMAL LOW (ref 12.0–15.0)
MCH: 28.9 pg (ref 26.0–34.0)
MCHC: 32 g/dL (ref 30.0–36.0)
MCV: 90.2 fL (ref 80.0–100.0)
Platelets: 293 10*3/uL (ref 150–400)
RBC: 3.36 MIL/uL — ABNORMAL LOW (ref 3.87–5.11)
RDW: 13.4 % (ref 11.5–15.5)
WBC: 4.4 10*3/uL (ref 4.0–10.5)
nRBC: 0 % (ref 0.0–0.2)

## 2024-01-25 LAB — FERRITIN: Ferritin: 34 ng/mL (ref 11–307)

## 2024-01-25 LAB — MITOCHONDRIAL ANTIBODIES: Mitochondrial M2 Ab, IgG: <20 U (ref 0.0–20.0)

## 2024-01-25 LAB — VITAMIN B12: Vitamin B-12: 808 pg/mL (ref 180–914)

## 2024-01-25 LAB — HEPARIN LEVEL (UNFRACTIONATED)
Heparin Unfractionated: 0.51 [IU]/mL (ref 0.30–0.70)
Heparin Unfractionated: 0.54 [IU]/mL (ref 0.30–0.70)

## 2024-01-25 LAB — APTT: aPTT: 64 s — ABNORMAL HIGH (ref 24–36)

## 2024-01-25 MED ORDER — GABAPENTIN 300 MG PO CAPS
300.0000 mg | ORAL_CAPSULE | Freq: Two times a day (BID) | ORAL | Status: DC
  Administered 2024-01-25 – 2024-01-26 (×3): 300 mg via ORAL
  Filled 2024-01-25 (×3): qty 1

## 2024-01-25 MED ORDER — POTASSIUM CHLORIDE CRYS ER 20 MEQ PO TBCR
40.0000 meq | EXTENDED_RELEASE_TABLET | Freq: Once | ORAL | Status: AC
  Administered 2024-01-25: 40 meq via ORAL
  Filled 2024-01-25: qty 2

## 2024-01-25 NOTE — Progress Notes (Signed)
 [facility] PROGRESS NOTE    Progress Note  Jessica Banks  FMW:[id number] DOB: October 30, 1981 DOA: 01/22/2024 PCP: Jarold Medici, MD     Brief Narrative:   Jessica Banks is an 43 y.o. female past medical history or recent surgery for internal hernia in December 2024, following this she presented and admitted for PE on Eliquis , with a history of Roux-en-Y and gastric sleeve surgery, history of cholecystectomy on December 2024 history of ADHD comes in for abdominal pain that has been going on for a month, had a visit in the ED on 01/16/2024 CT scan of the abdomen pelvis showed lesion in the pancreatic tail.  Was recommended to follow-up with general surgery, due to worsening abdominal pain came into the hospital mostly left upper quadrant denies nausea vomiting diarrhea fever and chills.  In the ED was found to elevated AST and Alt 90/47 bilirubin 0.6 alkaline phosphatase of 181, white blood cell count of 13, and a potassium of 3.2.  General surgery was consulted and recommended a GI consult.   Assessment/Plan:   New left flank intractable Abdominal pain: CT scan of the abdomen pelvis showed lesion in the pancreatic tail measuring 1 cm, small volume ascites. She relates she had a normal bowel movement the day prior to admission. Abdominal series showed no evidence of bowel obstruction free air.  Lipase is unremarkable General surgery has been consulted, agree with x-ray and to hold on an MRCP.  The lesion of the pancreas is unlikely to be the source of her new complaint. They recommended an UGI and GI consult -GI consult -- s/p EGD: normal esophagus/pouch -pro calcitonin negative -? ACNES-- patient says it feels superficial and is better when she curls up-- she was also on gabapentin  for 2 weeks and did not have any episodes-- will restart and follow up with pain management at [facility]  Elevated LFTs: Elevated alkaline phosphatase, mildly elevated LFTs unremarkable  bilirubin. -trending down -? MRCP inpt vs outpatient - defer to [facility]  History of PE on Eliquis : -change back to PO meds when no more procedures  Elevated blood pressure without a diagnosis of essential hypertension: Continue hydralazine  IV as needed.  Hyperglycemia: -A1c: 5.6  Hypokalemia: -replete  History of DDD: -Continue Adderall .  History of depression:  -Continue Cymbalta .  Constipation -bowel regimen    DVT prophylaxis: scd Family Communication:mother   IV Access:   Peripheral IV   Procedures and diagnostic studies:   CT ABDOMEN PELVIS W CONTRAST Result Date: 01/23/2024 CLINICAL DATA:  Postoperative abdominal pain. EXAM: CT ABDOMEN AND PELVIS WITH CONTRAST TECHNIQUE: Multidetector CT imaging of the abdomen and pelvis was performed using the standard protocol following bolus administration of intravenous contrast. RADIATION DOSE REDUCTION: This exam was performed according to the departmental dose-optimization program which includes automated exposure control, adjustment of the mA and/or kV according to patient size and/or use of iterative reconstruction technique. CONTRAST:  75mL OMNIPAQUE  IOHEXOL  350 MG/ML SOLN, 30mL OMNIPAQUE  IOHEXOL  300 MG/ML SOLN COMPARISON:  01/16/2024 FINDINGS: Lower chest: Patchy opacities in the left lung base could be due to motion artifact or pneumonia. Hepatobiliary: No focal liver abnormality is seen. Status post cholecystectomy. No biliary dilatation. Pancreas: Unremarkable. No pancreatic ductal dilatation or surrounding inflammatory changes. Pancreatic tail lesion seen on prior studies is not well visualized today, likely due to streak artifact. Spleen: Normal in size without focal abnormality. Adrenals/Urinary Tract: Adrenal glands are unremarkable. Kidneys are normal, without renal calculi, focal lesion, or hydronephrosis. Bladder is unremarkable. Stomach/Bowel:  Postoperative changes consistent with gastric bypass. Stomach, small bowel, and  colon are not abnormally distended. Residual contrast material is demonstrated in the small bowel and throughout the colon. Dense contrast material results in streak artifact, limiting the examination. Appendix is not identified. Vascular/Lymphatic: No significant vascular findings are present. No enlarged abdominal or pelvic lymph nodes. Reproductive: Uterus and bilateral adnexa are unremarkable. Other: Small amount of free fluid in the abdomen and pelvis consistent with ascites. Similar volume to previous study. No free air. Abdominal wall musculature appears intact. Musculoskeletal: No acute or significant osseous findings. IMPRESSION: 1. Postoperative changes consistent with gastric bypass. No evidence of bowel obstruction. Large amount of residual contrast material throughout the colon and in the small bowel. Residual contrast material limits examination. 2. Suggestion of patchy infiltrates in the left lung base. This may be due to motion artifact or pneumonia. 3. Small amount of ascites, similar to prior study. Electronically Signed   By: Elsie Gravely M.D.   On: 01/23/2024 19:46     Medical Consultants:   [facility]   Subjective:   Is thinking her pain maybe from a nerve impingement-- during the episodes has a firm node under a scar on her left side of abomen  Objective:    Vitals:   01/25/24 0321 01/25/24 0818 01/25/24 1206 01/25/24 1207  BP: 115/66 113/67  128/72  Pulse: 76 76 95 97  Resp: 18 19 19    Temp: 98.4 F (36.9 C) 98 F (36.7 C) 98.2 F (36.8 C)   TempSrc: Oral Oral Oral   SpO2: 98% 100% 100%   Weight:      Height:       SpO2: 100 %   Intake/Output Summary (Last 24 hours) at 01/25/2024 1449 Last data filed at 01/25/2024 1318 Gross per 24 hour  Intake --  Output 1300 ml  Net -1300 ml   Filed Weights   01/22/24 1607  Weight: 85.5 kg    Exam:  General: Appearance:    Obese female in no acute distress     Lungs:     respirations unlabored  Heart:    Normal  heart rate. .   MS:   All extremities are intact.   Neurologic:   Awake, alert      Data Reviewed:    Labs: Basic Metabolic Panel: Recent Labs  Lab 01/22/24 0345 01/22/24 0548 01/24/24 1104 01/25/24 0413  NA 138 137 140 139  K 3.2* 3.8 3.6 3.4*  CL 103 104 104 102  CO2 23 22 27 28   GLUCOSE 219* 194* 123* 92  BUN 13 13 <5* <5*  CREATININE 0.46 0.69 0.74 0.61  CALCIUM  9.1 9.0 9.0 8.7*   GFR Estimated Creatinine Clearance: 89.8 mL/min (by C-G formula based on SCr of 0.61 mg/dL). Liver Function Tests: Recent Labs  Lab 01/22/24 0345 01/22/24 0548 01/23/24 1453 01/24/24 1104 01/25/24 0413  AST 90* 82* 87* 52* 47*  ALT 47* 42 79* 54* 49*  ALKPHOS 181* 186* 191* 173* 162*  BILITOT 0.6 0.6 0.5 0.4 0.3  PROT 7.6 7.5 6.6 6.1* 5.7*  ALBUMIN  3.9 3.6 3.4* 3.0* 2.8*   Recent Labs  Lab 01/22/24 0345  LIPASE 23   No results for input(s): AMMONIA in the last 168 hours. Coagulation profile No results for input(s): INR, PROTIME in the last 168 hours. COVID-19 Labs  Recent Labs    01/25/24 0413  FERRITIN 34    Lab Results  Component Value Date   SARSCOV2NAA NEGATIVE 12/06/2020  CBC: Recent Labs  Lab 01/22/24 0225 01/22/24 0548 01/23/24 0513 01/24/24 1104 01/24/24 1837 01/25/24 0413  WBC 7.6 12.8* 6.3 3.7*  --  4.4  NEUTROABS  --  10.2*  --   --   --   --   HGB 12.5 13.7 10.4* 9.8* 9.9* 9.7*  HCT 40.9 45.1 32.3* 30.9* 31.2* 30.3*  MCV 94.7 94.0 91.8 92.0  --  90.2  PLT 334 382 286 270  --  293   Cardiac Enzymes: No results for input(s): CKTOTAL, CKMB, CKMBINDEX, TROPONINI in the last 168 hours. BNP (last 3 results) No results for input(s): PROBNP in the last 8760 hours. CBG: Recent Labs  Lab 01/23/24 1349  GLUCAP 104*   D-Dimer: No results for input(s): DDIMER in the last 72 hours. Hgb A1c: No results for input(s): HGBA1C in the last 72 hours.  Lipid Profile: No results for input(s): CHOL, HDL, LDLCALC, TRIG,  CHOLHDL, LDLDIRECT in the last 72 hours. Thyroid  function studies: No results for input(s): TSH, T4TOTAL, T3FREE, THYROIDAB in the last 72 hours.  Invalid input(s): FREET3 Anemia work up: Recent Labs    01/25/24 0413  VITAMINB12 808  FERRITIN 34  TIBC 294  IRON  42   Sepsis Labs: Recent Labs  Lab 01/22/24 0226 01/22/24 0548 01/23/24 0513 01/24/24 1104 01/25/24 0413  PROCALCITON  --   --   --  <0.10  --   WBC  --  12.8* 6.3 3.7* 4.4  LATICACIDVEN 1.7  --   --   --   --    Microbiology No results found for this or any previous visit (from the past 240 hours).   Medications:    amphetamine -dextroamphetamine   30 mg Oral Daily   DULoxetine   60 mg Oral Daily   gabapentin   300 mg Oral BID   pantoprazole  (PROTONIX ) IV  40 mg Intravenous Q12H   senna  1 tablet Oral Daily   temazepam   15 mg Oral QHS   Continuous Infusions:  heparin  1,200 Units/hr (01/25/24 0351)      LOS: 3 days   Jessica Banks Bowl  [facility]  01/25/2024, 2:49 PM

## 2024-01-25 NOTE — Progress Notes (Addendum)
 PHARMACY - ANTICOAGULATION CONSULT NOTE  Pharmacy Consult for heparin  drip Indication: pulmonary embolus  Allergies  Allergen Reactions   Novocain [Procaine] Anaphylaxis   Nsaids Other (See Comments)    H/o gastric bypass = avoid NSAIDs   Povidone Iodine Rash    Rash    Patient Measurements: Height: 5' (152.4 cm) Weight: 85.5 kg (188 lb 9.6 oz) IBW/kg (Calculated) : 45.5 kg Heparin  Dosing Weight: 83 kg  Vital Signs: Temp: 98 F (36.7 C) (02/07 0818) Temp Source: Oral (02/07 0818) BP: 113/67 (02/07 0818) Pulse Rate: 76 (02/07 0818)  Labs: Recent Labs    01/22/24 1936 01/22/24 1936 01/23/24 0513 01/23/24 1453 01/24/24 1104 01/24/24 1837 01/25/24 0413 01/25/24 1012  HGB  --    < > 10.4*  --  9.8* 9.9* 9.7*  --   HCT  --    < > 32.3*  --  30.9* 31.2* 30.3*  --   PLT  --   --  286  --  270  --  293  --   APTT 79*  --  109* 87*  --   --   --  64*  HEPARINUNFRC 1.04*  --  1.05*  --   --   --   --  0.51  CREATININE  --   --   --   --  0.74  --  0.61  --    < > = values in this interval not displayed.    Estimated Creatinine Clearance: 89.8 mL/min (by C-G formula based on SCr of 0.61 mg/dL).   Medical History: Past Medical History:  Diagnosis Date   Abnormal Pap smear    CIN I   Anemia    Anxiety    Depression    off meds now, ok now   Enlarged thyroid  gland    nromal function   GERD (gastroesophageal reflux disease)    Migraine    Obesity    Ovarian cyst    Vaginal Pap smear, abnormal    ok now    Assessment: Heparin  drip restarted ~ 12hrs after patient received one dose of eliquis  2/6. APTT and Heparin  level drawn at 6 hrs after heparin  restart are therapeutic.  Since aPTT and heparin  levels seem to be correlating, we can follow heparin  levels for now.  Goal of Therapy:  Heparin  level 0.3-0.7 units/ml aPTT 66-102 seconds Monitor platelets by anticoagulation protocol: Yes   Plan:  Continue heparin  drip at 1200 units/hr Heparin  level in 6 hours to  confirm therapeutic anticoagulation.   Neill Clarity 01/25/2024,11:28 AM  1557 6 hour Heparin  level=0.54. Will continue current Heparin  rate at 1200 units/hr. Daily heparin  levels and CBC.

## 2024-01-25 NOTE — Progress Notes (Signed)
 Progress Note  Chief Complaint: Abdominal pain  43 y.o. 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 and hiatal hernia repair in August 2023 , internal hernia repair and cholecystectomy December 2024 , anxiety, depression, ADHD , migraines, pulmonary embolism anticoagulant seen for left sided/flank abdominal pain and nausea, elevated LFTs, mild anemia   Subjective  - EGD performed 01/24/2024 unremarkable for pathology to explain her abdominal pain - Denies recurrent episodes of severe pain since since 01/23/2024 - Tolerating some liquids today -no vomiting but does have left-sided abdominal discomfort - Reports stools are loose -no melena or hematochezia but stools reported FOBT positive    Objective   Vital signs in last 24 hours: Temp:  [98 F (36.7 C)-98.6 F (37 C)] 98.3 F (36.8 C) (02/07 1532) Pulse Rate:  [76-97] 92 (02/07 1532) Resp:  [18-19] 19 (02/07 1532) BP: (113-130)/(66-76) 123/66 (02/07 1532) SpO2:  [97 %-100 %] 100 % (02/07 1532) Last BM Date : 01/21/24 General:    white female in NAD Heart:  Regular rate and rhythm; no murmurs Lungs: Respirations even and unlabored, lungs CTA bilaterally Abdomen:  Soft, nontender and nondistended. Normal bowel sounds. Extremities:  Without edema. Neurologic:  Alert and oriented,  grossly normal neurologically. Psych:  Cooperative. Normal mood and affect.  Intake/Output from previous day: 02/06 0701 - 02/07 0700 In: 227 [I.V.:227] Out: 1425 [Urine:1425] Intake/Output this shift: Total I/O In: 150.2 [I.V.:150.2] Out: 1900 [Urine:1900]  Lab Results: Recent Labs    01/23/24 0513 01/24/24 1104 01/24/24 1837 01/25/24 0413  WBC 6.3 3.7*  --  4.4  HGB 10.4* 9.8* 9.9* 9.7*  HCT 32.3* 30.9* 31.2* 30.3*  PLT 286 270  --  293   BMET Recent Labs    01/24/24 1104 01/25/24 0413  NA 140 139  K 3.6 3.4*  CL 104 102  CO2 27 28  GLUCOSE 123* 92  BUN <5* <5*   CREATININE 0.74 0.61  CALCIUM  9.0 8.7*   LFT Recent Labs    01/23/24 1453 01/24/24 1104 01/25/24 0413  PROT 6.6   < > 5.7*  ALBUMIN  3.4*   < > 2.8*  AST 87*   < > 47*  ALT 79*   < > 49*  ALKPHOS 191*   < > 162*  BILITOT 0.5   < > 0.3  BILIDIR <0.1  --   --   IBILI NOT CALCULATED  --   --    < > = values in this interval not displayed.   PT/INR No results for input(s): LABPROT, INR in the last 72 hours.  Studies/Results: CT ABDOMEN PELVIS W CONTRAST Result Date: 01/23/2024 CLINICAL DATA:  Postoperative abdominal pain. EXAM: CT ABDOMEN AND PELVIS WITH CONTRAST TECHNIQUE: Multidetector CT imaging of the abdomen and pelvis was performed using the standard protocol following bolus administration of intravenous contrast. RADIATION DOSE REDUCTION: This exam was performed according to the departmental dose-optimization program which includes automated exposure control, adjustment of the mA and/or kV according to patient size and/or use of iterative reconstruction technique. CONTRAST:  75mL OMNIPAQUE  IOHEXOL  350 MG/ML SOLN, 30mL OMNIPAQUE  IOHEXOL  300 MG/ML SOLN COMPARISON:  01/16/2024 FINDINGS: Lower chest: Patchy opacities in the left lung base could be due to motion artifact or pneumonia. Hepatobiliary: No focal liver abnormality is seen. Status post cholecystectomy. No biliary dilatation. Pancreas: Unremarkable. No pancreatic ductal dilatation or surrounding inflammatory changes. Pancreatic tail lesion seen on prior studies is not well visualized  today, likely due to streak artifact. Spleen: Normal in size without focal abnormality. Adrenals/Urinary Tract: Adrenal glands are unremarkable. Kidneys are normal, without renal calculi, focal lesion, or hydronephrosis. Bladder is unremarkable. Stomach/Bowel: Postoperative changes consistent with gastric bypass. Stomach, small bowel, and colon are not abnormally distended. Residual contrast material is demonstrated in the small bowel and throughout  the colon. Dense contrast material results in streak artifact, limiting the examination. Appendix is not identified. Vascular/Lymphatic: No significant vascular findings are present. No enlarged abdominal or pelvic lymph nodes. Reproductive: Uterus and bilateral adnexa are unremarkable. Other: Small amount of free fluid in the abdomen and pelvis consistent with ascites. Similar volume to previous study. No free air. Abdominal wall musculature appears intact. Musculoskeletal: No acute or significant osseous findings. IMPRESSION: 1. Postoperative changes consistent with gastric bypass. No evidence of bowel obstruction. Large amount of residual contrast material throughout the colon and in the small bowel. Residual contrast material limits examination. 2. Suggestion of patchy infiltrates in the left lung base. This may be due to motion artifact or pneumonia. 3. Small amount of ascites, similar to prior study. Electronically Signed   By: Elsie Gravely M.D.   On: 01/23/2024 19:46    UGI series 01/23/24 Roux-en-Y gastric bypass anatomy. No evidence of contrast leak,stricture, or obstruction.. Mildly enlarged gastric pouch, with small hiatal hernia. Severe esophageal dysmotility.  EGD 01/24/2024     Assessment / Plan:   It is my clinical impression that Ms. 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 Gribble severe episodic abdominal  pain has not been elucidated.  She has had multiple CT scans since December that have showed small volume ascites, hiatal hernia and a 1 cm fluid density in the pancreatic tail which are not felt to be culprits.  Upper GI series did not show any contrast leak, stricture or obstruction.  EGD was unremarkable for a source of pain.  At this juncture, Ms. Oconnell is accepting of the fact that an etiology of her pain is not known but open to coordinating a pain management plan with gabapentin .  Discussed her finding of esophageal dysmotility on barium study and explained that these studies cannot definitively diagnose esophageal dysmotility.  Esophageal dysmotility requires a manometry study for diagnosis.  She reports having one in 2023 did not show abnormalities.  Is not manifesting dysphagia.  She has had mild nonspecific elevation of her liver function tests -AST, ALT and alk phos.  Antimitochondrial antibodies are negative.  The trend is improving.  Expressed concern regarding a declining trend in her hemoglobin which most recently was 9.9.  FOBT was positive.  Discussed that in the setting of her upper endoscopy there was a small amount of bleeding when retroflexion was performed and that could account for FOBT positive.  I would recommend rechecking her hemoglobin when she is off of IV fluids.  Recommendations: Discussed with patient and hospitalist trial of gabapentin  beginning at 300 mg p.o. twice daily and could dose escalate over time  to 300 mg p.o. 3 times daily.  Explained that higher doses of gabapentin  are sometimes required for pain management.  He is interested in having a emergency pain management plan available and conveyed this to hospitalist.  Inpatient pain management team not available. Can trend CBC and would recommend rechecking CBC once patient is off of IV fluids as some decline in hemoglobin may be delusional. As above she is not manifesting signs or symptoms of esophageal  dysmotility.  I do not think further workup is warranted in this vein. We have discussed outpatient MRCP given elevated alkaline phosphatase and elevated liver enzymes.  If there is uptrending pattern this can be revisited inpatient.       LOS: 3 days   Inocente CHRISTELLA Hausen  01/25/2024, 6:05 PM

## 2024-01-25 NOTE — Progress Notes (Signed)
 1 Day Post-Op  Subjective: Patient reports improvement in previous LUQ pain, but still has abdominal discomfort. She is concerned about her anemia and positive hemoccult stool.   Objective: Vital signs in last 24 hours: Temp:  [98 F (36.7 C)-98.6 F (37 C)] 98 F (36.7 C) (02/07 0818) Pulse Rate:  [76-107] 76 (02/07 0818) Resp:  [18-20] 19 (02/07 0818) BP: (113-140)/(66-87) 113/67 (02/07 0818) SpO2:  [97 %-100 %] 100 % (02/07 0818) Last BM Date : 01/21/24  Intake/Output from previous day: 02/06 0701 - 02/07 0700 In: 227 [I.V.:227] Out: 1425 [Urine:1425] Intake/Output this shift: Total I/O In: -  Out: 900 [Urine:900]  PE: General: resting comfortably, NAD Neuro: alert and oriented, no focal deficits Resp: normal work of breathing on room air Abdomen: soft, nondistended   Lab Results:  Recent Labs    01/24/24 1104 01/24/24 1837 01/25/24 0413  WBC 3.7*  --  4.4  HGB 9.8* 9.9* 9.7*  HCT 30.9* 31.2* 30.3*  PLT 270  --  293   BMET Recent Labs    01/24/24 1104 01/25/24 0413  NA 140 139  K 3.6 3.4*  CL 104 102  CO2 27 28  GLUCOSE 123* 92  BUN <5* <5*  CREATININE 0.74 0.61  CALCIUM  9.0 8.7*   PT/INR No results for input(s): LABPROT, INR in the last 72 hours. CMP     Component Value Date/Time   NA 139 01/25/2024 0413   NA 138 09/23/2023 0000   K 3.4 (L) 01/25/2024 0413   CL 102 01/25/2024 0413   CO2 28 01/25/2024 0413   GLUCOSE 92 01/25/2024 0413   BUN <5 (L) 01/25/2024 0413   BUN 9 09/23/2023 0000   CREATININE 0.61 01/25/2024 0413   CREATININE 0.60 04/06/2016 1700   CALCIUM  8.7 (L) 01/25/2024 0413   PROT 5.7 (L) 01/25/2024 0413   PROT 6.9 03/28/2023 1000   ALBUMIN  2.8 (L) 01/25/2024 0413   ALBUMIN  4.2 03/28/2023 1000   AST 47 (H) 01/25/2024 0413   ALT 49 (H) 01/25/2024 0413   ALKPHOS 162 (H) 01/25/2024 0413   BILITOT 0.3 01/25/2024 0413   BILITOT <0.2 03/28/2023 1000   GFRNONAA >60 01/25/2024 0413   GFRNONAA >89 04/06/2016 1700    GFRAA >60 04/23/2019 0105   GFRAA >89 04/06/2016 1700   Lipase     Component Value Date/Time   LIPASE 23 01/22/2024 0345       Studies/Results: CT ABDOMEN PELVIS W CONTRAST Result Date: 01/23/2024 CLINICAL DATA:  Postoperative abdominal pain. EXAM: CT ABDOMEN AND PELVIS WITH CONTRAST TECHNIQUE: Multidetector CT imaging of the abdomen and pelvis was performed using the standard protocol following bolus administration of intravenous contrast. RADIATION DOSE REDUCTION: This exam was performed according to the departmental dose-optimization program which includes automated exposure control, adjustment of the mA and/or kV according to patient size and/or use of iterative reconstruction technique. CONTRAST:  75mL OMNIPAQUE  IOHEXOL  350 MG/ML SOLN, 30mL OMNIPAQUE  IOHEXOL  300 MG/ML SOLN COMPARISON:  01/16/2024 FINDINGS: Lower chest: Patchy opacities in the left lung base could be due to motion artifact or pneumonia. Hepatobiliary: No focal liver abnormality is seen. Status post cholecystectomy. No biliary dilatation. Pancreas: Unremarkable. No pancreatic ductal dilatation or surrounding inflammatory changes. Pancreatic tail lesion seen on prior studies is not well visualized today, likely due to streak artifact. Spleen: Normal in size without focal abnormality. Adrenals/Urinary Tract: Adrenal glands are unremarkable. Kidneys are normal, without renal calculi, focal lesion, or hydronephrosis. Bladder is unremarkable. Stomach/Bowel: Postoperative changes consistent with  gastric bypass. Stomach, small bowel, and colon are not abnormally distended. Residual contrast material is demonstrated in the small bowel and throughout the colon. Dense contrast material results in streak artifact, limiting the examination. Appendix is not identified. Vascular/Lymphatic: No significant vascular findings are present. No enlarged abdominal or pelvic lymph nodes. Reproductive: Uterus and bilateral adnexa are unremarkable. Other:  Small amount of free fluid in the abdomen and pelvis consistent with ascites. Similar volume to previous study. No free air. Abdominal wall musculature appears intact. Musculoskeletal: No acute or significant osseous findings. IMPRESSION: 1. Postoperative changes consistent with gastric bypass. No evidence of bowel obstruction. Large amount of residual contrast material throughout the colon and in the small bowel. Residual contrast material limits examination. 2. Suggestion of patchy infiltrates in the left lung base. This may be due to motion artifact or pneumonia. 3. Small amount of ascites, similar to prior study. Electronically Signed   By: Elsie Gravely M.D.   On: 01/23/2024 19:46   DG Abd Portable 1V Result Date: 01/23/2024 CLINICAL DATA:  Nausea, vomiting. EXAM: PORTABLE ABDOMEN - 1 VIEW COMPARISON:  January 22, 2024. FINDINGS: The bowel gas pattern is normal. Contrast is noted in nondilated colon. No radio-opaque calculi or other significant radiographic abnormality are seen. IMPRESSION: No abnormal bowel dilatation. Electronically Signed   By: Lynwood Landy Raddle M.D.   On: 01/23/2024 15:01    Anti-infectives: Anti-infectives (From admission, onward)    None        Assessment/Plan 43 yo female with a history of gastric sleeve converted to roux-en-Y gastric bypass. Had a recent internal hernia, s/p laparoscopic reduction and repair with cholecystectomy on 12/15/23. She has recurrent episodes of LUQ pain since surgery. Extensive workup including multiple CT scans, upper GI and EGD have not shown a clear source of pain. No evidence of recurrent hernia, bowel obstruction, or marginal ulcers. Suspect pain is musculoskeletal in etiology. Downtrend in hgb noted, presumably related to anticoagulation for acute PE. Given positive hemoccult, will likely need colonoscopy, but I do not think this will show a source for her pain. Will defer to GI on whether this can be done in outpatient setting.   LOS:  3 days    Leonor Dawn, MD [facility] Surgery General, Hepatobiliary and Pancreatic Surgery 01/25/24 11:00 AM

## 2024-01-26 DIAGNOSIS — R1013 Epigastric pain: Secondary | ICD-10-CM | POA: Diagnosis not present

## 2024-01-26 DIAGNOSIS — Z9884 Bariatric surgery status: Secondary | ICD-10-CM | POA: Diagnosis not present

## 2024-01-26 DIAGNOSIS — R7401 Elevation of levels of liver transaminase levels: Secondary | ICD-10-CM | POA: Diagnosis not present

## 2024-01-26 LAB — BASIC METABOLIC PANEL
Anion gap: 15 (ref 5–15)
BUN: <5 mg/dL — ABNORMAL LOW (ref 6–20)
CO2: 25 mmol/L (ref 22–32)
Calcium: 9.2 mg/dL (ref 8.9–10.3)
Chloride: 101 mmol/L (ref 98–111)
Creatinine, Ser: 0.65 mg/dL (ref 0.44–1.00)
GFR, Estimated: >60 mL/min (ref >60)
Glucose, Bld: 90 mg/dL (ref 70–99)
Potassium: 3.8 mmol/L (ref 3.5–5.1)
Sodium: 141 mmol/L (ref 135–145)

## 2024-01-26 LAB — CBC
HCT: 31.3 % — ABNORMAL LOW (ref 36.0–46.0)
Hemoglobin: 10.1 g/dL — ABNORMAL LOW (ref 12.0–15.0)
MCH: 29.4 pg (ref 26.0–34.0)
MCHC: 32.3 g/dL (ref 30.0–36.0)
MCV: 91 fL (ref 80.0–100.0)
Platelets: 290 10*3/uL (ref 150–400)
RBC: 3.44 MIL/uL — ABNORMAL LOW (ref 3.87–5.11)
RDW: 13.6 % (ref 11.5–15.5)
WBC: 5.7 10*3/uL (ref 4.0–10.5)
nRBC: 0 % (ref 0.0–0.2)

## 2024-01-26 LAB — HEPARIN LEVEL (UNFRACTIONATED): Heparin Unfractionated: 0.45 [IU]/mL (ref 0.30–0.70)

## 2024-01-26 LAB — APTT: aPTT: 88 s — ABNORMAL HIGH (ref 24–36)

## 2024-01-26 MED ORDER — APIXABAN 5 MG PO TABS
5.0000 mg | ORAL_TABLET | Freq: Two times a day (BID) | ORAL | Status: DC
  Administered 2024-01-26: 5 mg via ORAL
  Filled 2024-01-26 (×2): qty 1

## 2024-01-26 MED ORDER — NALOXONE HCL 4 MG/0.1ML NA LIQD: NASAL | 1 refills | Status: DC

## 2024-01-26 MED ORDER — GABAPENTIN 300 MG PO CAPS: ORAL_CAPSULE | ORAL | 0 refills | Status: DC

## 2024-01-26 MED ORDER — OXYCODONE HCL 5 MG/5ML PO SOLN: 5.0000 mg | ORAL | 0 refills | Status: DC | PRN

## 2024-01-26 NOTE — Discharge Summary (Signed)
 Physician Discharge Summary  Jessica Banks FMW:[id number] DOB: [birth year] DOA: 01/22/2024  PCP: Jarold Medici, MD  Admit date: 01/22/2024 Discharge date: 01/26/2024  Admitted From: home Discharge disposition: home   Recommendations for Outpatient Follow-Up:   Pain management for ? ACNES CMP/CBC 1 week Outpatient MRCP GI follow up for consideration of colonoscopy for heme + stools   Discharge Diagnosis:   Principal Problem:   Abdominal pain Active Problems:   Anxiety   History of bariatric surgery   Hyperglycemia   History of pulmonary embolism   ADHD   Hypokalemia   Transaminitis   Abdominal pain, epigastric   Nausea and vomiting   Abnormal upper gastrointestinal barium series   Hx of gastric bypass    Discharge Condition: Improved.  Diet recommendation: as tolerated  Wound care: None.  Code status: Full.   History of Present Illness:   Jessica Banks is an 43 y.o. female past medical history or recent surgery for internal hernia in December 2024, following this she presented and admitted for PE on Eliquis , with a history of Roux-en-Y and gastric sleeve surgery, history of cholecystectomy on December 2024 history of ADHD comes in for abdominal pain that has been going on for a month, had a visit in the ED on 01/16/2024 CT scan of the abdomen pelvis showed lesion in the pancreatic tail.  Was recommended to follow-up with general surgery, due to worsening abdominal pain came into the hospital mostly left upper quadrant denies nausea vomiting diarrhea fever and chills.  In the ED was found to elevated AST and Alt 90/47 bilirubin 0.6 alkaline phosphatase of 181, white blood cell count of 13, and a potassium of 3.2.  General surgery was consulted and recommended a GI consult.    Hospital Course by Problem:   New left flank intractable Abdominal pain: CT scan of the abdomen pelvis showed lesion in the pancreatic tail measuring 1 cm, small volume  ascites. She relates she had a normal bowel movement the day prior to admission. Abdominal series showed no evidence of bowel obstruction free air.  Lipase is unremarkable General surgery has been consulted, agree with x-ray and to hold on an MRCP.  The lesion of the pancreas is unlikely to be the source of her new complaint.  -GI consult -- s/p EGD: normal esophagus/pouch -pro calcitonin negative -? ACNES-- patient says it feels superficial and is better when she curls up-- she was also on gabapentin  for 2 weeks and did not have any episodes-- will restart and follow up with pain management at [facility] vs [facility]-- not sure if they do nerve injections  -restarted gabapentin -- titrate to TID -after discussion with patient and her pharmacist, patient prefers liquid oxycodone  as she feels it will be absorbed faster-- patient will only use for short term breakthrough pain until she can see pain management  Elevated LFTs: Elevated alkaline phosphatase, mildly elevated LFTs unremarkable bilirubin. -trending down -MRCP  outpatient - [facility] to order   History of PE on Eliquis : -resume   Elevated blood pressure without a diagnosis of essential hypertension: Resolved-- probably due to pain   Hyperglycemia: -A1c: 5.6   Hypokalemia: -repleted   History of DDD: -Continue Adderall .   History of depression:  -Continue Cymbalta .   Constipation -bowel regimen    Medical Consultants:    [facility]  Discharge Exam:   Vitals:   01/25/24 2240 01/26/24 0901  BP: 105/67 132/71  Pulse: 77 69  Resp: 16  18  Temp: 97.7 F (36.5 C) 98.2 F (36.8 C)  SpO2: 100% 98%   Vitals:   01/25/24 1532 01/25/24 1946 01/25/24 2240 01/26/24 0901  BP: 123/66 118/74 105/67 132/71  Pulse: 92 83 77 69  Resp: 19 18 16 18   Temp: 98.3 F (36.8 C) 97.6 F (36.4 C) 97.7 F (36.5 C) 98.2 F (36.8 C)  TempSrc: Oral Oral Oral Oral  SpO2: 100% 100% 100% 98%  Weight:      Height:        General exam:  Appears calm and comfortable.  Feeling better, wants to go home   The results of significant diagnostics from this hospitalization (including imaging, microbiology, ancillary and laboratory) are listed below for reference.     Procedures and Diagnostic Studies:   CT ABDOMEN PELVIS W CONTRAST Result Date: 01/23/2024 CLINICAL DATA:  Postoperative abdominal pain. EXAM: CT ABDOMEN AND PELVIS WITH CONTRAST TECHNIQUE: Multidetector CT imaging of the abdomen and pelvis was performed using the standard protocol following bolus administration of intravenous contrast. RADIATION DOSE REDUCTION: This exam was performed according to the departmental dose-optimization program which includes automated exposure control, adjustment of the mA and/or kV according to patient size and/or use of iterative reconstruction technique. CONTRAST:  75mL OMNIPAQUE  IOHEXOL  350 MG/ML SOLN, 30mL OMNIPAQUE  IOHEXOL  300 MG/ML SOLN COMPARISON:  01/16/2024 FINDINGS: Lower chest: Patchy opacities in the left lung base could be due to motion artifact or pneumonia. Hepatobiliary: No focal liver abnormality is seen. Status post cholecystectomy. No biliary dilatation. Pancreas: Unremarkable. No pancreatic ductal dilatation or surrounding inflammatory changes. Pancreatic tail lesion seen on prior studies is not well visualized today, likely due to streak artifact. Spleen: Normal in size without focal abnormality. Adrenals/Urinary Tract: Adrenal glands are unremarkable. Kidneys are normal, without renal calculi, focal lesion, or hydronephrosis. Bladder is unremarkable. Stomach/Bowel: Postoperative changes consistent with gastric bypass. Stomach, small bowel, and colon are not abnormally distended. Residual contrast material is demonstrated in the small bowel and throughout the colon. Dense contrast material results in streak artifact, limiting the examination. Appendix is not identified. Vascular/Lymphatic: No significant vascular findings are present.  No enlarged abdominal or pelvic lymph nodes. Reproductive: Uterus and bilateral adnexa are unremarkable. Other: Small amount of free fluid in the abdomen and pelvis consistent with ascites. Similar volume to previous study. No free air. Abdominal wall musculature appears intact. Musculoskeletal: No acute or significant osseous findings. IMPRESSION: 1. Postoperative changes consistent with gastric bypass. No evidence of bowel obstruction. Large amount of residual contrast material throughout the colon and in the small bowel. Residual contrast material limits examination. 2. Suggestion of patchy infiltrates in the left lung base. This may be due to motion artifact or pneumonia. 3. Small amount of ascites, similar to prior study. Electronically Signed   By: Elsie Gravely M.D.   On: 01/23/2024 19:46   DG Abd Portable 1V Result Date: 01/23/2024 CLINICAL DATA:  Nausea, vomiting. EXAM: PORTABLE ABDOMEN - 1 VIEW COMPARISON:  January 22, 2024. FINDINGS: The bowel gas pattern is normal. Contrast is noted in nondilated colon. No radio-opaque calculi or other significant radiographic abnormality are seen. IMPRESSION: No abnormal bowel dilatation. Electronically Signed   By: Lynwood Landy Raddle M.D.   On: 01/23/2024 15:01   DG UGI W SINGLE CM (SOL OR THIN BA) Result Date: 01/22/2024 CLINICAL DATA:  Severe left-sided abdominal pain and intermittent vomiting. Two months postop from internal hernia repair previous Roux-en-Y gastric bypass surgery and hiatal hernia repair. EXAM: UPPER GI SERIES WITH KUB  TECHNIQUE: After obtaining a scout radiograph a routine upper GI series was performed using water -soluble Omnipaque  followed by thin density barium. FLUOROSCOPY: Radiation Exposure Index (as provided by the fluoroscopic device): 45.5 mGy Kerma COMPARISON:  None Available. FINDINGS: Scout radiograph shows no evidence of dilated bowel loops. Large amount of stool is noted in the right colon. Patient initially drank 100 mL  water -soluble Omnipaque  contrast which showed no evidence of contrast leak or obstruction. Single contrast upper GI series with thin barium shows no evidence of esophageal mass or stricture. Severe esophageal dysmotility is seen, with severe stasis of contrast in the esophagus unless the patient is standing completely erect. Roux-en-Y gastric bypass anatomy is seen. Gastric pouch is mildly enlarged with a small hiatal hernia noted. Prompt contrast emptying into efferent jejunum is seen, with normal appearance of the gastrojejunal anastomosis. Efferent jejunum is located in the left abdomen, and is nondilated and normal in appearance. No evidence of stricture or abnormal fold thickening. IMPRESSION: Roux-en-Y gastric bypass anatomy. No evidence of contrast leak, stricture, or obstruction. Mildly enlarged gastric pouch, with small hiatal hernia. Severe esophageal dysmotility. Electronically Signed   By: Norleen DELENA Kil M.D.   On: 01/22/2024 10:03   DG ABD ACUTE 2+V W 1V CHEST Result Date: 01/22/2024 CLINICAL DATA:  [id number]. Abdominal pain. Severe diffuse abdominal pain.  Recent small-bowel obstruction. EXAM: DG ABDOMEN ACUTE WITH 1 VIEW CHEST COMPARISON:  Recent CT with IV contrast 01/16/2024 FINDINGS: There is no evidence of dilated bowel loops or free intraperitoneal air. There is mild fecal stasis. Postsurgical changes of prior gastric bypass and cholecystectomy are again shown. No radiopaque calculi or other significant radiographic abnormality is seen. Heart size and mediastinal contours are within normal limits. Both lungs are clear. Thoracic cage is intact with mild thoracic dextroscoliosis. IMPRESSION: 1. No evidence of bowel obstruction or free air. 2. Mild fecal stasis. 3. No acute radiographic chest findings. Electronically Signed   By: Francis Quam M.D.   On: 01/22/2024 06:52     Labs:   Basic Metabolic Panel: Recent Labs  Lab 01/22/24 0345 01/22/24 0548 01/24/24 1104 01/25/24 0413  01/26/24 0508  NA 138 137 140 139 141  K 3.2* 3.8 3.6 3.4* 3.8  CL 103 104 104 102 101  CO2 23 22 27 28 25   GLUCOSE 219* 194* 123* 92 90  BUN 13 13 <5* <5* <5*  CREATININE 0.46 0.69 0.74 0.61 0.65  CALCIUM  9.1 9.0 9.0 8.7* 9.2   GFR Estimated Creatinine Clearance: 89.8 mL/min (by C-G formula based on SCr of 0.65 mg/dL). Liver Function Tests: Recent Labs  Lab 01/22/24 0345 01/22/24 0548 01/23/24 1453 01/24/24 1104 01/25/24 0413  AST 90* 82* 87* 52* 47*  ALT 47* 42 79* 54* 49*  ALKPHOS 181* 186* 191* 173* 162*  BILITOT 0.6 0.6 0.5 0.4 0.3  PROT 7.6 7.5 6.6 6.1* 5.7*  ALBUMIN  3.9 3.6 3.4* 3.0* 2.8*   Recent Labs  Lab 01/22/24 0345  LIPASE 23   No results for input(s): AMMONIA in the last 168 hours. Coagulation profile No results for input(s): INR, PROTIME in the last 168 hours.  CBC: Recent Labs  Lab 01/22/24 0548 01/23/24 0513 01/24/24 1104 01/24/24 1837 01/25/24 0413 01/26/24 0508  WBC 12.8* 6.3 3.7*  --  4.4 5.7  NEUTROABS 10.2*  --   --   --   --   --   HGB 13.7 10.4* 9.8* 9.9* 9.7* 10.1*  HCT 45.1 32.3* 30.9* 31.2* 30.3* 31.3*  MCV 94.0 91.8  92.0  --  90.2 91.0  PLT 382 286 270  --  293 290   Cardiac Enzymes: No results for input(s): CKTOTAL, CKMB, CKMBINDEX, TROPONINI in the last 168 hours. BNP: Invalid input(s): POCBNP CBG: Recent Labs  Lab 01/23/24 1349  GLUCAP 104*   D-Dimer No results for input(s): DDIMER in the last 72 hours. Hgb A1c No results for input(s): HGBA1C in the last 72 hours. Lipid Profile No results for input(s): CHOL, HDL, LDLCALC, TRIG, CHOLHDL, LDLDIRECT in the last 72 hours. Thyroid  function studies No results for input(s): TSH, T4TOTAL, T3FREE, THYROIDAB in the last 72 hours.  Invalid input(s): FREET3 Anemia work up Recent Labs    01/25/24 0413  VITAMINB12 808  FERRITIN 34  TIBC 294  IRON  42   Microbiology No results found for this or any previous visit (from the past  240 hours).   Discharge Instructions:   Discharge Instructions     Ambulatory referral to Pain Clinic   Complete by: As directed    Ambulatory referral to Physical Therapy   Complete by: As directed    Diet general   Complete by: As directed    Discharge instructions   Complete by: As directed    Work note from General surgery Follow up with [facility] pain clinic (unable to place outside referral to [facility] but I did [facility] internal [facility]-- not sure if they would be able to do shot but just in case you can check)   Increase activity slowly   Complete by: As directed       Allergies as of 01/26/2024       Reactions   Novocain [procaine] Anaphylaxis   Nsaids Other (See Comments)   H/o gastric bypass = avoid NSAIDs   Povidone Iodine Rash   Rash        Medication List     STOP taking these medications    oxyCODONE  5 MG immediate release tablet Commonly known as: Oxy IR/ROXICODONE  Replaced by: oxyCODONE  5 MG/5ML solution   oxyCODONE -acetaminophen  5-325 MG tablet Commonly known as: PERCOCET/ROXICET       TAKE these medications    acetaminophen  500 MG tablet Commonly known as: TYLENOL  Take 2 tablets (1,000 mg total) by mouth every 6 (six) hours as needed. What changed: reasons to take this   amphetamine -dextroamphetamine  30 MG 24 hr capsule Commonly known as: ADDERALL  XR Take 30 mg by mouth daily.   apixaban  5 MG Tabs tablet Commonly known as: ELIQUIS  Take 1 tablet (5 mg total) by mouth 2 (two) times daily.   butalbital -acetaminophen -caffeine  50-325-40 MG tablet Commonly known as: FIORICET  Take 1 tablet by mouth 2 (two) times daily as needed. What changed:  how much to take when to take this reasons to take this   cyclobenzaprine  10 MG tablet Commonly known as: FLEXERIL  Take 1 tablet (10 mg total) by mouth daily as needed. What changed: reasons to take this   DULoxetine  60 MG capsule Commonly known as: Cymbalta  Take 1 capsule  (60 mg total) by mouth daily.   Emgality  120 MG/ML Soaj Generic drug: Galcanezumab -gnlm Inject 1 Pen into the skin every 30 (thirty) days.   FORTIFY PROBIOTIC WOMENS PO Take 1 capsule by mouth at bedtime.   gabapentin  300 MG capsule Commonly known as: NEURONTIN  Would start with 300mg  BID x 1 week then increase to 300mg  TID thereafter What changed:  how to take this additional instructions   IRON  PO Take 1 tablet by mouth at bedtime.  Melatonin 10 MG Caps Take 10 mg by mouth at bedtime.   multivitamin tablet Take 1 tablet by mouth at bedtime.   naloxone  4 MG/0.1ML Liqd nasal spray kit Commonly known as: NARCAN  For opoid overdose   Nurtec 75 MG Tbdp Generic drug: Rimegepant Sulfate Take 1 tablet (75 mg total) by mouth daily as needed. What changed: reasons to take this   oxyCODONE  5 MG/5ML solution Commonly known as: ROXICODONE  Take 5 mLs (5 mg total) by mouth every 4 (four) hours as needed for severe pain (pain score 7-10). Replaces: oxyCODONE  5 MG immediate release tablet   temazepam  15 MG capsule Commonly known as: RESTORIL  TAKE 1 CAPSULE BY MOUTH AT BEDTIME AS NEEDED FOR SLEEP What changed: See the new instructions.   VITAMIN D  PO Take 1 capsule by mouth at bedtime.       patient expressed gratitude for care from all her caregivers   Time coordinating discharge: 45 min  Signed:  Harlene RAYMOND Bowl DO  [facility] 01/26/2024, 12:34 PM

## 2024-01-26 NOTE — Progress Notes (Signed)
 2 Days Post-Op  Subjective: Patient reports abdominal soreness but no further acute severe episodes of pain. She is tolerating diet and having bowel function. Discussion regarding workup to date without clear etiology of abdominal pain but have ruled out acutely worrisome sources. She wants to make sure there is a clear plan for pain control as an outpatient and is very willing to see pain management in the outpatient setting moving forward. Hoping to get back to work soon but agrees that shorter shifts at first may be a better way to try and ease back into things. Willing to try OP PT and see if that provides any improvement as well.    Objective: Vital signs in last 24 hours: Temp:  [97.6 F (36.4 C)-98.3 F (36.8 C)] 97.7 F (36.5 C) (02/07 2240) Pulse Rate:  [77-97] 77 (02/07 2240) Resp:  [16-19] 16 (02/07 2240) BP: (105-128)/(66-74) 105/67 (02/07 2240) SpO2:  [100 %] 100 % (02/07 2240) Last BM Date : 01/21/24  Intake/Output from previous day: 02/07 0701 - 02/08 0700 In: 285.4 [I.V.:285.4] Out: 2550 [Urine:2550] Intake/Output this shift: No intake/output data recorded.  PE: General: resting comfortably, NAD Neuro: alert and oriented, no focal deficits Resp: normal work of breathing on room air Abdomen: soft, nondistended   Lab Results:  Recent Labs    01/25/24 0413 01/26/24 0508  WBC 4.4 5.7  HGB 9.7* 10.1*  HCT 30.3* 31.3*  PLT 293 290   BMET Recent Labs    01/25/24 0413 01/26/24 0508  NA 139 141  K 3.4* 3.8  CL 102 101  CO2 28 25  GLUCOSE 92 90  BUN <5* <5*  CREATININE 0.61 0.65  CALCIUM  8.7* 9.2   PT/INR No results for input(s): LABPROT, INR in the last 72 hours. CMP     Component Value Date/Time   NA 141 01/26/2024 0508   NA 138 09/23/2023 0000   K 3.8 01/26/2024 0508   CL 101 01/26/2024 0508   CO2 25 01/26/2024 0508   GLUCOSE 90 01/26/2024 0508   BUN <5 (L) 01/26/2024 0508   BUN 9 09/23/2023 0000   CREATININE 0.65 01/26/2024 0508    CREATININE 0.60 04/06/2016 1700   CALCIUM  9.2 01/26/2024 0508   PROT 5.7 (L) 01/25/2024 0413   PROT 6.9 03/28/2023 1000   ALBUMIN  2.8 (L) 01/25/2024 0413   ALBUMIN  4.2 03/28/2023 1000   AST 47 (H) 01/25/2024 0413   ALT 49 (H) 01/25/2024 0413   ALKPHOS 162 (H) 01/25/2024 0413   BILITOT 0.3 01/25/2024 0413   BILITOT <0.2 03/28/2023 1000   GFRNONAA >60 01/26/2024 0508   GFRNONAA >89 04/06/2016 1700   GFRAA >60 04/23/2019 0105   GFRAA >89 04/06/2016 1700   Lipase     Component Value Date/Time   LIPASE 23 01/22/2024 0345       Studies/Results: No results found.   Anti-infectives: Anti-infectives (From admission, onward)    None        Assessment/Plan 43 yo female with a history of gastric sleeve converted to roux-en-Y gastric bypass. Had a recent internal hernia, s/p laparoscopic reduction and repair with cholecystectomy on 12/15/23. She has recurrent episodes of LUQ pain since surgery. Extensive workup including multiple CT scans, upper GI and EGD have not shown a clear source of pain. No evidence of recurrent hernia, bowel obstruction, or marginal ulcers. Suspect pain is musculoskeletal in etiology. Downtrend in hgb noted, presumably related to anticoagulation for acute PE. Positive hemeoccult thought to be secondary to small  amount of bleeding during EGD, hgb is 10 this AM. At this time I think discharge with pain management plan and outpatient follow up are reasonable. Discussed with primary attending as well. Will send referral for outpatient PT.    LOS: 4 days   Burnard JONELLE Louder, [facility] 01/26/2024, 8:57 AM Please see Amion for pager number during day hours 7:00am-4:30pm

## 2024-01-26 NOTE — Plan of Care (Signed)

## 2024-01-29 NOTE — Progress Notes (Unsigned)
I,Victoria T Deloria Lair, CMA,acting as a Neurosurgeon for Gwynneth Aliment, MD.,have documented all relevant documentation on the behalf of Gwynneth Aliment, MD,as directed by  Gwynneth Aliment, MD while in the presence of Gwynneth Aliment, MD.  Subjective:  Patient ID: Jessica Banks , female    DOB: 1981-10-08 , 43 y.o.   MRN: [id number]  No chief complaint on file.   HPI  Patient presents today for hospital follow up. She visited [facility] at [facility] on 01/22/2024 for abdominal pain. Discharged on 01/26/2024. Today she reports feeling     Past Medical History:  Diagnosis Date  . Abnormal Pap smear    CIN I  . Anemia   . Anxiety   . Depression    off meds now, ok now  . Enlarged thyroid gland    nromal function  . GERD (gastroesophageal reflux disease)   . Migraine   . Obesity   . Ovarian cyst   . Vaginal Pap smear, abnormal    ok now     Family History  Problem Relation Age of Onset  . Thyroid disease Mother        s/p thyroidectomy  . Arthritis Mother   . Leukemia Maternal Grandmother   . Stroke Maternal Grandmother   . Cancer Maternal Grandfather        lung  . Heart disease Paternal Grandmother   . COPD Paternal Grandfather   . Hypertension Maternal Uncle   . Diabetes Maternal Uncle      Current Outpatient Medications:  .  acetaminophen (TYLENOL) 500 MG tablet, Take 2 tablets (1,000 mg total) by mouth every 6 (six) hours as needed. (Patient taking differently: Take 1,000 mg by mouth every 6 (six) hours as needed for mild pain (pain score 1-3) or moderate pain (pain score 4-6).), Disp: , Rfl:  .  amphetamine-dextroamphetamine (ADDERALL XR) 30 MG 24 hr capsule, Take 30 mg by mouth daily., Disp: , Rfl:  .  apixaban (ELIQUIS) 5 MG TABS tablet, Take 1 tablet (5 mg total) by mouth 2 (two) times daily., Disp: 60 tablet, Rfl: 2 .  butalbital-acetaminophen-caffeine (FIORICET) 50-325-40 MG tablet, Take 1 tablet by mouth 2 (two) times daily as needed. (Patient taking  differently: Take 2 tablets by mouth daily as needed for headache or migraine.), Disp: 30 tablet, Rfl: 0 .  cyclobenzaprine (FLEXERIL) 10 MG tablet, Take 1 tablet (10 mg total) by mouth daily as needed. (Patient taking differently: Take 10 mg by mouth daily as needed for muscle spasms.), Disp: 30 tablet, Rfl: 1 .  DULoxetine (CYMBALTA) 60 MG capsule, Take 1 capsule (60 mg total) by mouth daily., Disp: 90 capsule, Rfl: 4 .  Ferrous Sulfate (IRON PO), Take 1 tablet by mouth at bedtime., Disp: , Rfl:  .  gabapentin (NEURONTIN) 300 MG capsule, Would start with 300mg  BID x 1 week then increase to 300mg  TID thereafter, Disp: 90 capsule, Rfl: 0 .  Galcanezumab-gnlm (EMGALITY) 120 MG/ML SOAJ, Inject 1 Pen into the skin every 30 (thirty) days., Disp: 1.12 mL, Rfl: 6 .  Melatonin 10 MG CAPS, Take 10 mg by mouth at bedtime., Disp: , Rfl:  .  Multiple Vitamin (MULTIVITAMIN) tablet, Take 1 tablet by mouth at bedtime., Disp: , Rfl:  .  naloxone (NARCAN) nasal spray 4 mg/0.1 mL, For opoid overdose, Disp: 1 each, Rfl: 1 .  oxyCODONE (ROXICODONE) 5 MG/5ML solution, Take 5 mLs (5 mg total) by mouth every 4 (four) hours as needed for severe  pain (pain score 7-10)., Disp: 30 mL, Rfl: 0 .  Probiotic Product (FORTIFY PROBIOTIC WOMENS PO), Take 1 capsule by mouth at bedtime., Disp: , Rfl:  .  Rimegepant Sulfate (NURTEC) 75 MG TBDP, Take 1 tablet (75 mg total) by mouth daily as needed. (Patient taking differently: Take 75 mg by mouth daily as needed (migraine).), Disp: 8 tablet, Rfl: 6 .  temazepam (RESTORIL) 15 MG capsule, TAKE 1 CAPSULE BY MOUTH AT BEDTIME AS NEEDED FOR SLEEP (Patient taking differently: Take 15 mg by mouth at bedtime.), Disp: 30 capsule, Rfl: 2 .  VITAMIN D PO, Take 1 capsule by mouth at bedtime., Disp: , Rfl:    Allergies  Allergen Reactions  . Novocain [Procaine] Anaphylaxis  . Nsaids Other (See Comments)    H/o gastric bypass = avoid NSAIDs  . Povidone Iodine Rash    Rash     Review of Systems   Constitutional: Negative.   Respiratory: Negative.    Cardiovascular: Negative.   Neurological: Negative.   Psychiatric/Behavioral: Negative.      There were no vitals filed for this visit. There is no height or weight on file to calculate BMI.  Wt Readings from Last 3 Encounters:  01/22/24 188 lb 9.6 oz (85.5 kg)  01/15/24 183 lb (83 kg)  12/24/23 188 lb 6.4 oz (85.5 kg)     Objective:  Physical Exam      Assessment And Plan:  Abdominal pain, epigastric  Pancreatic lesion  Elevated liver enzymes  Hypokalemia  Nerve entrapment  History of pulmonary embolism     No follow-ups on file.  Patient was given opportunity to ask questions. Patient verbalized understanding of the plan and was able to repeat key elements of the plan. All questions were answered to their satisfaction.  Gwynneth Aliment, MD  I, Gwynneth Aliment, MD, have reviewed all documentation for this visit. The documentation on 01/29/24 for the exam, diagnosis, procedures, and orders are all accurate and complete.   IF YOU HAVE BEEN REFERRED TO A SPECIALIST, IT MAY TAKE 1-2 WEEKS TO SCHEDULE/PROCESS THE REFERRAL. IF YOU HAVE NOT HEARD FROM US/SPECIALIST IN TWO WEEKS, PLEASE GIVE [place] A CALL AT [phone].   THE PATIENT IS ENCOURAGED TO PRACTICE SOCIAL DISTANCING DUE TO THE COVID-19 PANDEMIC.

## 2024-01-30 ENCOUNTER — Encounter: Payer: Self-pay | Admitting: Internal Medicine

## 2024-01-30 ENCOUNTER — Ambulatory Visit: Payer: Commercial Managed Care - PPO | Admitting: Internal Medicine

## 2024-01-30 VITALS — BP 146/80 | HR 121 | Temp 98.1°F | Ht 60.0 in | Wt 184.8 lb

## 2024-01-30 DIAGNOSIS — E66812 Obesity, class 2: Secondary | ICD-10-CM | POA: Diagnosis not present

## 2024-01-30 DIAGNOSIS — G8929 Other chronic pain: Secondary | ICD-10-CM

## 2024-01-30 DIAGNOSIS — R1013 Epigastric pain: Secondary | ICD-10-CM

## 2024-01-30 DIAGNOSIS — G589 Mononeuropathy, unspecified: Secondary | ICD-10-CM | POA: Diagnosis not present

## 2024-01-30 DIAGNOSIS — D6869 Other thrombophilia: Secondary | ICD-10-CM | POA: Diagnosis not present

## 2024-01-30 DIAGNOSIS — Z86711 Personal history of pulmonary embolism: Secondary | ICD-10-CM

## 2024-01-30 DIAGNOSIS — K869 Disease of pancreas, unspecified: Secondary | ICD-10-CM | POA: Diagnosis not present

## 2024-01-30 DIAGNOSIS — R748 Abnormal levels of other serum enzymes: Secondary | ICD-10-CM

## 2024-01-30 DIAGNOSIS — E876 Hypokalemia: Secondary | ICD-10-CM

## 2024-01-30 DIAGNOSIS — R030 Elevated blood-pressure reading, without diagnosis of hypertension: Secondary | ICD-10-CM

## 2024-01-30 DIAGNOSIS — Z6836 Body mass index (BMI) 36.0-36.9, adult: Secondary | ICD-10-CM

## 2024-01-30 DIAGNOSIS — D649 Anemia, unspecified: Secondary | ICD-10-CM

## 2024-01-30 LAB — CBC
Hematocrit: 35.5 % (ref 34.0–46.6)
Hemoglobin: 11.2 g/dL (ref 11.1–15.9)
MCH: 28.5 pg (ref 26.6–33.0)
MCHC: 31.5 g/dL (ref 31.5–35.7)
MCV: 90 fL (ref 79–97)
Platelets: 411 10*3/uL (ref 150–450)
RBC: 3.93 x10E6/uL (ref 3.77–5.28)
RDW: 12.2 % (ref 11.7–15.4)
WBC: 5.8 10*3/uL (ref 3.4–10.8)

## 2024-01-30 LAB — CMP14+EGFR
ALT: 57 [IU]/L — ABNORMAL HIGH (ref 0–32)
AST: 50 [IU]/L — ABNORMAL HIGH (ref 0–40)
Albumin: 4.2 g/dL (ref 3.9–4.9)
Alkaline Phosphatase: 220 [IU]/L — ABNORMAL HIGH (ref 44–121)
BUN/Creatinine Ratio: 17 (ref 9–23)
BUN: 12 mg/dL (ref 6–24)
Bilirubin Total: <0.2 mg/dL (ref 0.0–1.2)
CO2: 22 mmol/L (ref 20–29)
Calcium: 9.8 mg/dL (ref 8.7–10.2)
Chloride: 99 mmol/L (ref 96–106)
Creatinine, Ser: 0.71 mg/dL (ref 0.57–1.00)
Globulin, Total: 2.9 g/dL (ref 1.5–4.5)
Glucose: 88 mg/dL (ref 70–99)
Potassium: 4.7 mmol/L (ref 3.5–5.2)
Sodium: 137 mmol/L (ref 134–144)
Total Protein: 7.1 g/dL (ref 6.0–8.5)
eGFR: 109 mL/min/{1.73_m2} (ref >59)

## 2024-01-30 MED ORDER — HYDROCODONE-ACETAMINOPHEN 5-325 MG PO TABS: 1.0000 | ORAL_TABLET | Freq: Four times a day (QID) | ORAL | 0 refills | Status: DC | PRN

## 2024-01-30 NOTE — Assessment & Plan Note (Signed)
I will refer her to GI for CRC screening and further evaluation.  I will check repeat CBC today.

## 2024-01-30 NOTE — Assessment & Plan Note (Signed)
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  She is encouraged to aim for at least 150 minutes of exercise per week once she has healed from her recent procedure.

## 2024-01-30 NOTE — Assessment & Plan Note (Signed)
She is currently on Eliquis due to PE.

## 2024-01-30 NOTE — Assessment & Plan Note (Addendum)
TCM PERFORMED. UNFORTUNATELY, A TEAM MEMBER DID NOT PERFORM TOC CALL.   DISCHARGE SUMMARY WAS REVIEWED IN FULL DETAIL DURING THE VISIT. MEDS RECONCILED AND COMPARED TO DISCHARGE MEDS. MEDICATION LIST WAS UPDATED AND REVIEWED WITH THE PATIENT. GREATER THAN 50% FACE TO FACE TIME WAS SPENT IN COUNSELING AND COORDINATION OF CARE. ALL QUESTIONS WERE ANSWERED TO THE SATISFACTION OF THE PATIENT. She has done research and has found that [facility] handles these type of cases. I will place referral as requested.

## 2024-01-30 NOTE — Assessment & Plan Note (Signed)
She has MRCP ordered by General Surgery.

## 2024-01-31 DIAGNOSIS — G8929 Other chronic pain: Secondary | ICD-10-CM | POA: Insufficient documentation

## 2024-01-31 NOTE — Assessment & Plan Note (Signed)
Unfortunately, she has had persistent pain since her abdominal surgery. It is now felt that her sx are due to nerve entrapment. She is currently on gabapentin 300mg  twice daily, will increase to tid dosing in the near future. She was given rx hydrocodone to use prn. She was previously given rx oxycodone to use when the pain is 10/10 - in the past she has sought treatment in ER for this pain. Hopefully, with her new treatment plan, these sx do not return.

## 2024-01-31 NOTE — Assessment & Plan Note (Signed)
This is likely exacerbated by her health concerns. No need for treatment at this time. However, if elevated at next visit, will need to initiate treatment. She should follow a low sodium diet.

## 2024-02-06 ENCOUNTER — Ambulatory Visit: Payer: Commercial Managed Care - PPO | Admitting: Physical Therapy

## 2024-02-07 ENCOUNTER — Ambulatory Visit: Payer: BC Managed Care – PPO | Admitting: Internal Medicine

## 2024-02-09 ENCOUNTER — Other Ambulatory Visit: Payer: Self-pay | Admitting: Internal Medicine

## 2024-02-11 ENCOUNTER — Encounter: Payer: Self-pay | Admitting: Internal Medicine

## 2024-02-12 ENCOUNTER — Other Ambulatory Visit: Payer: Self-pay

## 2024-02-12 MED ORDER — APIXABAN 5 MG PO TABS: 5.0000 mg | ORAL_TABLET | Freq: Two times a day (BID) | ORAL | 2 refills | Status: DC

## 2024-02-15 NOTE — Therapy (Signed)
 OUTPATIENT PHYSICAL THERAPY THORACOLUMBAR EVALUATION   Patient Name: Jessica Banks MRN: [id number] DOB:1981-03-19, 43 y.o., female Today's Date: 02/18/2024  END OF SESSION:  PT End of Session - 02/18/24 1057     Visit Number 1    Authorization Type U[employer]    PT Start Time 1100    PT Stop Time 1145    PT Time Calculation (min) 45 min             Past Medical History:  Diagnosis Date   Abnormal Pap smear    CIN I   Anemia    Anxiety    Depression    off meds now, ok now   Enlarged thyroid gland    nromal function   GERD (gastroesophageal reflux disease)    Migraine    Obesity    Ovarian cyst    Vaginal Pap smear, abnormal    ok now   Past Surgical History:  Procedure Laterality Date   ESOPHAGOGASTRODUODENOSCOPY (EGD) WITH PROPOFOL N/A 01/24/2024   Procedure: ESOPHAGOGASTRODUODENOSCOPY (EGD) WITH PROPOFOL;  Surgeon: Ottie Glazier, MD;  [facility];  Service: Gastroenterology;  Laterality: N/A;   GASTRIC BYPASS  08/14/2022   Revision of sleeve/hiatal hernia repair   LAPAROSCOPIC GASTRIC SLEEVE RESECTION  2017   LAPAROSCOPY N/A 12/05/2023   Procedure: LAPAROSCOPY DIAGNOSTIC, CHOLECYSTECOMY, INTERNAL HERNIA REPAIR;  Surgeon: Fritzi Mandes, MD;  Location: [facility] ORS;  Service: General;  Laterality: N/A;   Patient Active Problem List   Diagnosis Date Noted   Other chronic pain 01/31/2024   Nerve entrapment 01/30/2024   Pancreatic lesion 01/30/2024   Elevated liver enzymes 01/30/2024   Acquired thrombophilia (HCC) 01/30/2024   Abdominal pain, epigastric 01/24/2024   Nausea and vomiting 01/24/2024   Abnormal upper gastrointestinal barium series 01/24/2024   Hx of gastric bypass 01/24/2024   Transaminitis 01/23/2024   Abdominal pain 01/22/2024   History of pulmonary embolism 01/22/2024   ADHD 01/22/2024   Hypokalemia 01/22/2024   Postoperative ileus (HCC) 12/30/2023   Dyspnea 12/14/2023   Insomnia 12/14/2023   Hyponatremia 12/14/2023   Mild  protein malnutrition (HCC) 12/14/2023   Anemia 12/14/2023   Hyperglycemia 12/14/2023   Thrombocytosis 12/14/2023   Acute lower UTI (urinary tract infection) 12/14/2023   Acute pulmonary embolism (HCC) 12/13/2023   Internal hernia 12/05/2023   Hypoglycemia 11/20/2023   Elevated blood pressure reading 11/20/2023   Elevated alkaline phosphatase level 03/06/2021   Vitamin D deficiency disease 03/06/2021   Sciatica of right side 03/06/2021   Thyroid nodule 03/06/2021   Uterine myoma 12/06/2020   Unwanted fertility 11/26/2020   Obesity affecting pregnancy, antepartum 09/01/2020   Supervision of other normal pregnancy, antepartum 08/30/2020   Antepartum multigravida of advanced maternal age 43/13/2021   Gastro-esophageal reflux disease without esophagitis 02/25/2020   Abnormal ECG 07/31/2019   Minimal coronary artery risk chest pain 07/31/2019   Near syncope 07/16/2017   History of bariatric surgery 04/04/2016   Anxiety    Migraine    Obesity     PCP: Dorothyann Peng  REFERRING PROVIDER: Lars Pinks  REFERRING DIAG:  [phone] (ICD-10-CM) - Hx of gastric bypass  R10.13 (ICD-10-CM) - Abdominal pain, epigastric    Rationale for Evaluation and Treatment: Rehabilitation  THERAPY DIAG:  Hx of gastric bypass  Abdominal pain, epigastric  ONSET DATE: 12/05/23  SUBJECTIVE:  SUBJECTIVE STATEMENT: Abdominal wall pain on my L side. Cutaneous nerve pain where my incision is. I went to the ER and they ruled everything and said it was nerve entrapment. I am seeing a pain management specialist and he said it is abdominal wall or visceral. He is going to do a TA nerve block on the 13th. If that doesn't work he is going to send me to another specialist.   Has had 4 episodes of really bad pain. Where it goes  from 0-100 and she starts pouring down in sweat and it lasts 45 mins-2hrs and in severe pain.   PERTINENT HISTORY:  01/22/24- ED HPI-- Jessica Banks is a 43 y.o. female with a complex past medical history noteworthy for remote gastric sleeve, Roux-en-Y and hiatal hernia repair admitted to the hospital with episodes of severe midepigastric/left upper quadrant abdominal pain and nausea.  Abdominal CT and upper GI series have not yielded an etiology for her pain episodes.  Upper GI series showed esophageal dysmotility but patient is denying symptoms of dysphagia.  EGD is performed today to evaluate mucosa of the upper GI tract, assess for reflux, assess gastric pouch and status of Roux-en  PAIN:  Are you having pain? Yes: NPRS scale: 3-4/10 Pain location: L side abdominal  Pain description: sharp, shooting Aggravating factors: not that I know of  Relieving factors: pain manages it   PRECAUTIONS: None  RED FLAGS: None   WEIGHT BEARING RESTRICTIONS: No  FALLS:  Has patient fallen in last 6 months? No  LIVING ENVIRONMENT: Lives with: lives with their family Lives in: House/apartment Stairs: Yes: Internal: 30 steps; on right going up  OCCUPATION: NP  PLOF: Independent and Independent with basic ADLs  PATIENT GOALS: get back to the gym and work and avoid episodes of work   NEXT MD VISIT: 02/28/24  OBJECTIVE:  Note: Objective measures were completed at Evaluation unless otherwise noted.  DIAGNOSTIC FINDINGS:  IMPRESSION: 1. Postoperative changes consistent with gastric bypass. No evidence of bowel obstruction. Large amount of residual contrast material throughout the colon and in the small bowel. Residual contrast material limits examination. 2. Suggestion of patchy infiltrates in the left lung base. This may be due to motion artifact or pneumonia. 3. Small amount of ascites, similar to prior study.  COGNITION: Overall cognitive status: Within functional limits for tasks  assessed     LUMBAR ROM: not tested  LOWER EXTREMITY ROM:  not tested   LOWER EXTREMITY MMT:  not tested    GAIT: Distance walked: in clinic distances Assistive device utilized: None Level of assistance: Complete Independence  TREATMENT DATE: 02/18/24- eval                                                                                                                                 PATIENT EDUCATION:  Education details: HEP Person educated: Patient Education method: Explanation Education comprehension: verbalized understanding  HOME EXERCISE PROGRAM: Access  Code: [email or web address] URL: [email or web address] Date: 02/18/2024 Prepared by: Cassie Freer  Exercises - Supine Posterior Pelvic Tilt  - 1 x daily - 7 x weekly - 3 sets - 10 reps - Supine Bridge  - 1 x daily - 7 x weekly - 3 sets - 10 reps - Supine March with Posterior Pelvic Tilt  - 1 x daily - 7 x weekly - 3 sets - 10 reps - Supine Straight Leg Raise with Pelvic Floor Contraction  - 1 x daily - 7 x weekly - 3 sets - 10 reps - Bilateral Bent Leg Lift  - 1 x daily - 7 x weekly - 3 sets - 10 reps   ASSESSMENT:  CLINICAL IMPRESSION: Patient is a 43 y.o. female who was seen today for physical therapy evaluation and treatment for abdominal pain. She has had gastric bypass 08/14/2022 and had another procedure done on 12/05/2023 for a laparoscopy, cholecystectomy and internal hernia repair. Today she presents with ongoing abdominal pain at her incision site. She has had 4 episodes of severe pain where it is debilitating. There is no pattern to these occurrences. Pt went back to the doctor and has a transverse abdominus plane block on 02/28/24 scheduled. If this does not work, her problems may be more visceral and she will see another specialist after. Patient has a Psychologist, educational and will go back to the gym once she is cleared to do so. Because of this we decided that her trainer will be able to provide appropriate training for her  to help ease back into the gym. She also has a high copay so this option works better for her. I gave her some light core exercises to start working on as she is still healing and working on managing her pain.  Patient encouraged to call back with any specific changes or development of limitations during functional activities, as well as to continue follow-up with physician, as needed.?     OBJECTIVE IMPAIRMENTS: pain.   ACTIVITY LIMITATIONS: lifting  PARTICIPATION LIMITATIONS: occupation and gym  CLINICAL DECISION MAKING: Evolving/moderate complexity  EVALUATION COMPLEXITY: Low   GOALS: Goals reviewed with patient? Yes  SHORT TERM GOALS: Target date: 02/18/24  Pt will verbalize understanding of role/purpose of physical therapy services and be able to independently identify if/when she may need to seek an add'l referral for services to assist w/ participation in functional activities Goal status: MET  PLAN:  PT FREQUENCY: one time visit  PT DURATION: other: one time visit  PLANNED INTERVENTIONS: Skilled PT services are not warranted at this time; pt encouraged to call back with any changes in functional status or limitations.     [place], PT 02/18/2024, 11:35 AM

## 2024-02-18 ENCOUNTER — Ambulatory Visit: Payer: Commercial Managed Care - PPO | Attending: Physician Assistant

## 2024-02-18 DIAGNOSIS — Z9884 Bariatric surgery status: Secondary | ICD-10-CM | POA: Diagnosis present

## 2024-02-18 DIAGNOSIS — R1013 Epigastric pain: Secondary | ICD-10-CM | POA: Insufficient documentation

## 2024-02-21 ENCOUNTER — Other Ambulatory Visit (HOSPITAL_COMMUNITY): Payer: Self-pay

## 2024-04-02 ENCOUNTER — Ambulatory Visit (INDEPENDENT_AMBULATORY_CARE_PROVIDER_SITE_OTHER): Payer: BC Managed Care – PPO | Admitting: Internal Medicine

## 2024-04-02 ENCOUNTER — Encounter: Payer: Self-pay | Admitting: Internal Medicine

## 2024-04-02 VITALS — BP 120/84 | HR 84 | Temp 98.1°F | Ht 60.0 in | Wt 189.3 lb

## 2024-04-02 DIAGNOSIS — R748 Abnormal levels of other serum enzymes: Secondary | ICD-10-CM

## 2024-04-02 DIAGNOSIS — Z0000 Encounter for general adult medical examination without abnormal findings: Secondary | ICD-10-CM

## 2024-04-02 DIAGNOSIS — E66812 Obesity, class 2: Secondary | ICD-10-CM

## 2024-04-02 DIAGNOSIS — F5089 Other specified eating disorder: Secondary | ICD-10-CM | POA: Diagnosis not present

## 2024-04-02 DIAGNOSIS — R41840 Attention and concentration deficit: Secondary | ICD-10-CM

## 2024-04-02 DIAGNOSIS — R7989 Other specified abnormal findings of blood chemistry: Secondary | ICD-10-CM

## 2024-04-02 DIAGNOSIS — F419 Anxiety disorder, unspecified: Secondary | ICD-10-CM

## 2024-04-02 DIAGNOSIS — D649 Anemia, unspecified: Secondary | ICD-10-CM

## 2024-04-02 DIAGNOSIS — Z6836 Body mass index (BMI) 36.0-36.9, adult: Secondary | ICD-10-CM

## 2024-04-02 DIAGNOSIS — Z79899 Other long term (current) drug therapy: Secondary | ICD-10-CM

## 2024-04-02 DIAGNOSIS — G8929 Other chronic pain: Secondary | ICD-10-CM

## 2024-04-02 MED ORDER — HYDROCODONE-ACETAMINOPHEN 5-325 MG PO TABS: 1.0000 | ORAL_TABLET | Freq: Four times a day (QID) | ORAL | 0 refills | Status: DC | PRN

## 2024-04-02 NOTE — Patient Instructions (Signed)

## 2024-04-02 NOTE — Assessment & Plan Note (Signed)

## 2024-04-02 NOTE — Progress Notes (Signed)
 I,Jessica Banks, CMA,acting as a Neurosurgeon for Smiley Dung, MD.,have documented all relevant documentation on the behalf of Smiley Dung, MD,as directed by  Smiley Dung, MD while in the presence of Smiley Dung, MD.  Subjective:    Patient ID: Jessica Banks , female    DOB: 10-17-1981 , 43 y.o.   MRN: [id number]  Chief Complaint  Patient presents with   Annual Exam    Patient presents today for annual exam. She reports compliance with medications. Denies headache, chest pain & sob. She is not currently est with an GYN.  While here today she would like her PTH checked. She also would like for you to take over her adderall  rx. She is followed by [facility] neuro fo this prescription. But due to insurance her copay for medication comes out as $100. She also complains of craving ice a lot more recently. Because of that, she would like her iron checked to make sure her     HPI Discussed the use of AI scribe software for clinical note transcription with the patient, who gave verbal consent to proceed.  History of Present Illness She is a 43 year old female who presents for a physical exam.  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 Her previous job provided a favorable work-life balance, which was important for managing her responsibilities with her children. The job offered flexible scheduling and benefits, including insurance coverage for her and her children, which did not come out of her paycheck.  She now plans to work PRN with [employer].     Past Medical History:  Diagnosis Date   Abnormal Pap smear    CIN I   Anemia    Anxiety    Depression    off meds now, ok now   Enlarged thyroid  gland    nromal function   GERD (gastroesophageal reflux disease)    Migraine    Obesity    Ovarian cyst    Vaginal Pap smear, abnormal    ok now     Family History  Problem Relation Age of Onset   Thyroid   disease Mother        s/p thyroidectomy   Arthritis Mother    Leukemia Maternal Grandmother    Stroke Maternal Grandmother    Cancer Maternal Grandfather        lung   Heart disease Paternal Grandmother    COPD Paternal Grandfather    Hypertension Maternal Uncle    Diabetes Maternal Uncle      Current Outpatient Medications:    amphetamine -dextroamphetamine  (ADDERALL  XR) 30 MG 24 hr capsule, Take 30 mg by mouth daily., Disp: , Rfl:    apixaban  (ELIQUIS ) 5 MG TABS tablet, Take 1 tablet (5 mg total) by mouth 2 (two) times daily., Disp: 60 tablet, Rfl: 2   DULoxetine  (CYMBALTA ) 60 MG capsule, Take 1 capsule (60 mg total) by mouth daily., Disp: 90 capsule, Rfl: 4   Ferrous Sulfate  (IRON PO), Take 1 tablet by mouth at bedtime., Disp: , Rfl:    Galcanezumab -gnlm (EMGALITY ) 120 MG/ML SOAJ, Inject 1 Pen into the skin every 30 (thirty) days., Disp: 1.12 mL, Rfl: 6   Melatonin 10 MG CAPS, Take 10 mg by mouth at bedtime., Disp: , Rfl:    Multiple Vitamin (MULTIVITAMIN) tablet, Take 1 tablet by mouth at bedtime., Disp: , Rfl:    Probiotic Product (FORTIFY PROBIOTIC WOMENS PO), Take 1 capsule by  mouth at bedtime., Disp: , Rfl:    Rimegepant Sulfate (NURTEC) 75 MG TBDP, Take 1 tablet (75 mg total) by mouth daily as needed. (Patient taking differently: Take 75 mg by mouth daily as needed (migraine).), Disp: 8 tablet, Rfl: 6   temazepam  (RESTORIL ) 15 MG capsule, TAKE 1 CAPSULE BY MOUTH AT BEDTIME AS NEEDED FOR SLEEP, Disp: 30 capsule, Rfl: 2   VITAMIN D  PO, Take 1 capsule by mouth at bedtime., Disp: , Rfl:    cyclobenzaprine  (FLEXERIL ) 10 MG tablet, TAKE 1 TABLET BY MOUTH EVERY DAY AS NEEDED, Disp: 30 tablet, Rfl: 1   HYDROcodone -acetaminophen  (NORCO/VICODIN) 5-325 MG tablet, Take 1 tablet by mouth every 6 (six) hours as needed for moderate pain (pain score 4-6)., Disp: 20 tablet, Rfl: 0   naloxone  (NARCAN ) nasal spray 4 mg/0.1 mL, For opoid overdose (Patient not taking: Reported on 04/02/2024), Disp: 1  each, Rfl: 1   Allergies  Allergen Reactions   Novocain [Procaine] Anaphylaxis   Nsaids Other (See Comments)    H/o gastric bypass = avoid NSAIDs   Povidone Iodine Rash    Rash      The patient states she uses vasectomy for birth control. No LMP recorded.. Negative for Dysmenorrhea. Negative for: breast discharge, breast lump(s), breast pain and breast self exam. Associated symptoms include abnormal vaginal bleeding. Pertinent negatives include abnormal bleeding (hematology), anxiety, decreased libido, depression, difficulty falling sleep, dyspareunia, history of infertility, nocturia, sexual dysfunction, sleep disturbances, urinary incontinence, urinary urgency, vaginal discharge and vaginal itching. Diet regular.The patient states her exercise level is  moderate including strength training and cardio.   . The patient's tobacco use is:  Social History   Tobacco Use  Smoking Status Former   Current packs/day: 0.00   Types: Cigarettes   Start date: 06/07/2009   Quit date: 06/07/2013   Years since quitting: 10.8  Smokeless Tobacco Never  Tobacco Comments   only smoked on the weekends  . She has been exposed to passive smoke. The patient's alcohol use is:  Social History   Substance and Sexual Activity  Alcohol Use Not Currently   Comment: SOCIAL    Review of Systems  Constitutional: Negative.   HENT: Negative.    Eyes: Negative.   Respiratory: Negative.    Cardiovascular: Negative.   Gastrointestinal:        She still has intermittent abdominal pain, initially felt to be due to nerve entrapment s/p laparoscopic surgery. She was seen by [facility] and advised that this may be due to visceral pain. Her sx have improved; however, would like refill of pain meds to take when she has a flare.   Endocrine: Negative.   Genitourinary: Negative.   Musculoskeletal: Negative.   Skin: Negative.   Allergic/Immunologic: Negative.   Neurological: Negative.   Hematological:  Negative.        She c/o PICA. Wants to have her iron levels checked. Denies having any abnormal vaginal bleeding and rectal bleeding.   Psychiatric/Behavioral: Negative.       Today's Vitals   04/02/24 0924  BP: 120/84  Pulse: 84  Temp: 98.1 F (36.7 C)  SpO2: 98%  Weight: 189 lb 4.8 oz (85.9 kg)  Height: 5' (1.524 m)   Body mass index is 36.97 kg/m.  Wt Readings from Last 3 Encounters:  04/02/24 189 lb 4.8 oz (85.9 kg)  01/30/24 184 lb 12.8 oz (83.8 kg)  01/22/24 188 lb 9.6 oz (85.5 kg)     Objective:  Physical  Exam Vitals and nursing note reviewed.  Constitutional:      Appearance: Normal appearance.  HENT:     Head: Normocephalic and atraumatic.     Right Ear: Tympanic membrane, ear canal and external ear normal.     Left Ear: Tympanic membrane, ear canal and external ear normal.     Nose: Nose normal.     Mouth/Throat:     Mouth: Mucous membranes are moist.     Pharynx: Oropharynx is clear.  Eyes:     Extraocular Movements: Extraocular movements intact.     Conjunctiva/sclera: Conjunctivae normal.     Pupils: Pupils are equal, round, and reactive to light.  Cardiovascular:     Rate and Rhythm: Normal rate and regular rhythm.     Pulses: Normal pulses.     Heart sounds: Normal heart sounds.  Pulmonary:     Effort: Pulmonary effort is normal.     Breath sounds: Normal breath sounds.  Chest:  Breasts:    Tanner Score is 5.     Right: Normal.     Left: Normal.  Abdominal:     General: Bowel sounds are normal.     Palpations: Abdomen is soft.     Tenderness: There is abdominal tenderness.  Genitourinary:    Comments: deferred Musculoskeletal:        General: Normal range of motion.     Cervical back: Normal range of motion and neck supple.  Skin:    General: Skin is warm and dry.  Neurological:     General: No focal deficit present.     Mental Status: She is alert and oriented to person, place, and time.  Psychiatric:        Mood and Affect: Mood  normal.        Behavior: Behavior normal.         Assessment And Plan:     Encounter for general adult medical examination w/o abnormal findings Assessment & Plan: A full exam was performed.  Importance of monthly self breast exams was discussed with the patient.  She is advised to get 30-45 minutes of regular exercise, no less than four to five days per week. Both weight-bearing and aerobic exercises are recommended.  She is advised to follow a healthy diet with at least six fruits/veggies per day, decrease intake of red meat and other saturated fats and to increase fish intake to twice weekly.  Meats/fish should not be fried -- baked, boiled or broiled is preferable. It is also important to cut back on your sugar intake.  Be sure to read labels - try to avoid anything with added sugar, high fructose corn syrup or other sweeteners.  If you must use a sweetener, you can try stevia or monkfruit.  It is also important to avoid artificially sweetened foods/beverages and diet drinks. Lastly, wear SPF 50 sunscreen on exposed skin and when in direct sunlight for an extended period of time.  Be sure to avoid fast food restaurants and aim for at least 60 ounces of water  daily.      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 Would like to get refills here, agrees to go back if she needs adjustment in meds. Will need to get records if not already  scanned in media.    Pica Assessment & Plan: This is a known symptom of anemia. Will check labs as below and supplement with iron as needed.   Orders: -     CBC -     Iron, TIBC and Ferritin Panel  Elevated alkaline phosphatase level -     PTH, intact and calcium   Elevated parathyroid hormone -     PTH, intact and calcium   Chronic mechanical visceral  pain Assessment & Plan: Pain Mgmt note reviewed in detail. PDMP reviewed. She was given refill of Hydrocodone  to take prn. Her sx appear to be improving, slowly but surely.    Class 2 severe obesity due to excess calories with serious comorbidity and body mass index (BMI) of 36.0 to 36.9 in adult [facility]) Assessment & Plan: She is encouraged to strive for BMI less than 30 to decrease cardiac risk. Advised to aim for at least 150 minutes of exercise per week.    Drug therapy -     Compliance Drug Analysis, Ur  Other orders -     HYDROcodone -Acetaminophen ; Take 1 tablet by mouth every 6 (six) hours as needed for moderate pain (pain score 4-6).  Dispense: 20 tablet; Refill: 0     Return for 1 year HM. Patient was given opportunity to ask questions. Patient verbalized understanding of the plan and was able to repeat key elements of the plan. All questions were answered to their satisfaction.    I, Smiley Dung, MD, have reviewed all documentation for this visit. The documentation on 04/02/24 for the exam, diagnosis, procedures, and orders are all accurate and complete.

## 2024-04-03 DIAGNOSIS — R41840 Attention and concentration deficit: Secondary | ICD-10-CM | POA: Insufficient documentation

## 2024-04-03 LAB — LIPID PANEL
Chol/HDL Ratio: 1.9 ratio (ref 0.0–4.4)
Cholesterol, Total: 168 mg/dL (ref 100–199)
HDL: 90 mg/dL (ref >39)
LDL Chol Calc (NIH): 68 mg/dL (ref 0–99)
Triglycerides: 45 mg/dL (ref 0–149)
VLDL Cholesterol Cal: 10 mg/dL (ref 5–40)

## 2024-04-03 LAB — CBC
Hematocrit: 35.9 % (ref 34.0–46.6)
Hemoglobin: 11.1 g/dL (ref 11.1–15.9)
MCH: 26.7 pg (ref 26.6–33.0)
MCHC: 30.9 g/dL — ABNORMAL LOW (ref 31.5–35.7)
MCV: 86 fL (ref 79–97)
Platelets: 389 10*3/uL (ref 150–450)
RBC: 4.16 x10E6/uL (ref 3.77–5.28)
RDW: 12.2 % (ref 11.7–15.4)
WBC: 6.6 10*3/uL (ref 3.4–10.8)

## 2024-04-03 LAB — IRON,TIBC AND FERRITIN PANEL
Ferritin: 13 ng/mL — ABNORMAL LOW (ref 15–150)
Iron Saturation: 11 % — ABNORMAL LOW (ref 15–55)
Iron: 46 ug/dL (ref 27–159)
Total Iron Binding Capacity: 403 ug/dL (ref 250–450)
UIBC: 357 ug/dL (ref 131–425)

## 2024-04-03 LAB — CMP14+EGFR
ALT: 40 IU/L — ABNORMAL HIGH (ref 0–32)
AST: 56 IU/L — ABNORMAL HIGH (ref 0–40)
Albumin: 4.2 g/dL (ref 3.9–4.9)
Alkaline Phosphatase: 224 IU/L — ABNORMAL HIGH (ref 44–121)
BUN/Creatinine Ratio: 17 (ref 9–23)
BUN: 11 mg/dL (ref 6–24)
Bilirubin Total: <0.2 mg/dL (ref 0.0–1.2)
CO2: 23 mmol/L (ref 20–29)
Calcium: 9.4 mg/dL (ref 8.7–10.2)
Chloride: 104 mmol/L (ref 96–106)
Creatinine, Ser: 0.65 mg/dL (ref 0.57–1.00)
Globulin, Total: 2.8 g/dL (ref 1.5–4.5)
Glucose: 98 mg/dL (ref 70–99)
Potassium: 4.4 mmol/L (ref 3.5–5.2)
Sodium: 140 mmol/L (ref 134–144)
Total Protein: 7 g/dL (ref 6.0–8.5)
eGFR: 113 mL/min/{1.73_m2} (ref >59)

## 2024-04-03 LAB — PTH, INTACT AND CALCIUM: PTH: 119 pg/mL — ABNORMAL HIGH (ref 15–65)

## 2024-04-03 NOTE — Assessment & Plan Note (Signed)
 This is a known symptom of anemia. Will check labs as below and supplement with iron as needed.

## 2024-04-03 NOTE — Assessment & Plan Note (Signed)
 Chronic, previously followed at [facility], copay is now prohibitive. Would like to get refills here, agrees to go back if she needs adjustment in meds. Will need to get records if not already scanned in media.

## 2024-04-04 ENCOUNTER — Other Ambulatory Visit: Payer: Self-pay | Admitting: Diagnostic Neuroimaging

## 2024-04-06 LAB — COMPLIANCE DRUG ANALYSIS, UR

## 2024-04-08 NOTE — Assessment & Plan Note (Signed)
 She is encouraged to strive for BMI less than 30 to decrease cardiac risk. Advised to aim for at least 150 minutes of exercise per week.

## 2024-04-08 NOTE — Assessment & Plan Note (Signed)
 Pain Mgmt note reviewed in detail. PDMP reviewed. She was given refill of Hydrocodone  to take prn. Her sx appear to be improving, slowly but surely.

## 2024-04-14 ENCOUNTER — Encounter: Payer: Self-pay | Admitting: Internal Medicine

## 2024-04-29 NOTE — Progress Notes (Unsigned)
 04/30/2024 Jessica Banks [id number] August 12, 1981  Referring provider: Cleave Curling, MD Primary GI doctor: Dr. Yvone Herd [facility])  ASSESSMENT AND PLAN:  IDA with FOBT positive stools  04/02/2024  HGB 11.1 MCV 86 Platelets 389 04/02/2024 Iron 46 Ferritin 13 B12 808 Recent Labs    01/16/24 0014 01/22/24 0225 01/22/24 0548 01/23/24 0513 01/24/24 1104 01/24/24 1837 01/25/24 0413 01/26/24 0508 01/30/24 0943 04/02/24 1026  HGB 11.9* 12.5 13.7 10.4* 9.8* 9.9* 9.7* 10.1* 11.2 11.1  in setting of Roux-en-Y August 2023 Recent EGD unremarkable 01/24/2024 -On iron but still having low iron, will send for IV iron -Likely from absorption but with FOBT and colon cancer in her brother, schedule for colonoscopy to evaluate -We have discussed the risks of bleeding, infection, perforation, medication reactions, and remote risk of death associated with colonoscopy. All questions were answered and the patient acknowledges these risk and wishes to proceed.  -Will do suprep, patient expressed concern over amount of prep, discussed taking her time but call us  if she has any issues.    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 hiatal hernia repair August 2023 Likely contributing to iron deficiency -B12 normal - send for IV Iron  GERD without dysphagia Status post cholecystectomy Upper GI series 01/23/2024 esophageal dysmotility slightly enlarged gastric pouch 01/24/2024 unremarkable EGD Well controlled at this time  severe epigastric left upper quadrant abdominal pain Status post cholecystectomy and lap internal hernia repair December 2024, complicated by PE and post op ileus Multiple CT scans December 2024 unremarkable Had continuing LUQ AB pain post op 01/23/2024 CT during admission showed small volume ascites, hiatal hernia and 1 cm fluid density lesion in the pancreatic tail 01/23/2024 upper GI series showed no contrast leak stricture or obstruction did show esophageal  dysmotility and slightly enlarged gastric pouch 01/24/2024 unremarkable EGD - likely nerve entrapment, seeing pain management, on gapapentin and  -Will order MRCP to evaluate with elevated alk phos - schedule colonoscopy  1 cm fluid density lesion pancreatic tail seen on CT with elevated alk phos  AST 56 ALT 40  Alkphos 224 TBili <0.2 Elevated alkaline phosphatase isolated, GGT normal 2022 Has seen endocrinology, unremarkable work up, elevated PTH, normal calcium , did prescription vitamin D  During hospital visit, minor AST elevations with alk phos Negative antimitochondrial antibody -Will order MRCP to evaluate  History of PE And anticoagulant on Eliquis  Hold Eliquis  for 2 days before procedure will instruct when and how to resume after procedure.  Patient understands that there is a low but real risk of cardiovascular event such as heart attack, stroke, or embolism /  thrombosis, or ischemia while off Eliquis   The patient consents to proceed.  Will communicate by phone or EMR with patient's prescribing provider to confirm that holding Eliquis  is reasonable in this case.   I have reviewed the clinic note as outlined by Santina Cull, PA and agree with the assessment, plan and medical decision making.  Ms. Ames presents to the office for follow-up after inpatient consultation in February 2025 for severe epigastric abdominal pain.  Pain currently attributed to potential nerve entrapment and being followed by pain management-on gabapentin  and underwent an ultrasound guided procedure with some relief.  Continues to manifest anemia despite overall normal EGD.  May have low iron related to previous bariatric surgery.  Reasonable to schedule colonoscopy and coordinate with prescriber of Eliquis  given recent PE -need to ensure patient is in a safe window to withhold anticoagulant.  Was incidentally noted to  have a pancreatic tail lesion on CT imaging during inpatient hospitalization.  Hepatic  transaminases now normal with persistent elevation of alkaline phosphatase.  Agree with MRCP.  Eugenia Hess, MD    Patient Care Team: Cleave Curling, MD as PCP - General (Internal Medicine) Quincey Quesinberry Jungling Tomas Fountain, MD as PCP - Cardiology (Cardiology) Ivery Marking, MD as Attending Physician (Obstetrics and Gynecology) Lavetta Potts, MD as Attending Physician (Neurology)  HISTORY OF PRESENT ILLNESS: 43 y.o. female with a past medical history listed below presents for evaluation of IDA, AB pain, pancreatic lesion.   Discussed the use of AI scribe software for clinical note transcription with the patient, who gave verbal consent to proceed.  History of Present Illness   Jessica Banks is a 43 year old female with a history of Roux-en-Y gastric bypass and hiatal hernia repair who presents with persistent abdominal pain.  She has been experiencing persistent abdominal pain since December 2024, following a Roux-en-Y gastric bypass and hiatal hernia repair in August 2023. The pain began after an internal hernia required emergency surgery. Postoperatively, she experienced disproportionate pain at one of the laparoscopic sites, which was not alleviated by regular pain medication. Despite multiple ER visits, the pain persisted until a trial of gabapentin  provided some relief. An ultrasound-guided procedure in March 2025 further reduced the severity of her pain.  In February 2025, she was hospitalized for a pulmonary embolism and post-alveolus, treated with heparin  and transitioned to Eliquis . Despite this, she continued to experience severe abdominal pain. A pancreatic lesion was identified on a CT scan in December 2024, which was new compared to previous imaging from 2017. The lesion was noted on the pancreas with some fluid density seen on imaging, and her liver function tests showed elevated alkaline phosphatase and AST levels. She has been under endocrinology care for elevated alkaline phosphatase  and has been on prescription vitamin D .  She has a family history of pancreatic cancer in her half-sisters and their mother, and a brother who died of colon cancer in his sixties. She has experienced changes in bowel habits post-cholecystectomy, with occasional 'gallbladder poops.' During hospitalization, her stool was positive for fecal occult blood.  She has experienced episodes of hypoglycemia, waking up drenched in sweat, and has been able to confirm low blood sugar levels at home. Initial workup for insulinoma was negative, and she was referred to endocrinology, but the appointment was delayed due to her other medical issues.  Her current medications include gabapentin  450 mg three times a day, Voltaren gel, lidocaine  patches, an iron supplement, bariatric vitamins, and a multivitamin. She continues to experience iron deficiency despite supplementation.        She  reports that she quit smoking about 10 years ago. Her smoking use included cigarettes. She started smoking about 14 years ago. She has never used smokeless tobacco. She reports that she does not currently use alcohol. She reports that she does not use drugs.  RELEVANT GI HISTORY, IMAGING AND LABS: Results   LABS LFTs: Elevated alkaline phosphatase, elevated AST (01/2024) PTH: Elevated Calcium : Normal Hb: 10 (03/2024) Fecal occult blood: Positive (01/2024)  RADIOLOGY Abdominal ultrasound: Sludge with no stones CT abdomen: Internal hernia (12/04/2023) CT abdomen: New lesion on pancreatic tail with fluid density (02/15/2024)     UGI series 01/23/24 Roux-en-Y gastric bypass anatomy. No evidence of contrast leak,stricture, or obstruction.. Mildly enlarged gastric pouch, with small hiatal hernia. Severe esophageal dysmotility.   EGD 01/24/2024     CBC    Component  Value Date/Time   WBC 6.6 04/02/2024 1026   WBC 5.7 01/26/2024 0508   RBC 4.16 04/02/2024 1026   RBC 3.44 (L) 01/26/2024 0508   HGB 11.1 04/02/2024 1026    HGB 10.3 05/11/2020 0000   HCT 35.9 04/02/2024 1026   HCT 32 05/11/2020 0000   PLT 389 04/02/2024 1026   PLT 416 05/11/2020 0000   MCV 86 04/02/2024 1026   MCH 26.7 04/02/2024 1026   MCH 29.4 01/26/2024 0508   MCHC 30.9 (L) 04/02/2024 1026   MCHC 32.3 01/26/2024 0508   RDW 12.2 04/02/2024 1026   LYMPHSABS 2.1 01/22/2024 0548   MONOABS 0.4 01/22/2024 0548   EOSABS 0.1 01/22/2024 0548   BASOSABS 0.0 01/22/2024 0548   Recent Labs    01/16/24 0014 01/22/24 0225 01/22/24 0548 01/23/24 0513 01/24/24 1104 01/24/24 1837 01/25/24 0413 01/26/24 0508 01/30/24 0943 04/02/24 1026  HGB 11.9* 12.5 13.7 10.4* 9.8* 9.9* 9.7* 10.1* 11.2 11.1    CMP     Component Value Date/Time   NA 140 04/02/2024 1026   K 4.4 04/02/2024 1026   CL 104 04/02/2024 1026   CO2 23 04/02/2024 1026   GLUCOSE 98 04/02/2024 1026   GLUCOSE 90 01/26/2024 0508   BUN 11 04/02/2024 1026   CREATININE 0.65 04/02/2024 1026   CREATININE 0.60 04/06/2016 1700   CALCIUM  9.4 04/02/2024 1026   PROT 7.0 04/02/2024 1026   ALBUMIN  4.2 04/02/2024 1026   AST 56 (H) 04/02/2024 1026   ALT 40 (H) 04/02/2024 1026   ALKPHOS 224 (H) 04/02/2024 1026   BILITOT <0.2 04/02/2024 1026   GFRNONAA >60 01/26/2024 0508   GFRNONAA >89 04/06/2016 1700   GFRAA >60 04/23/2019 0105   GFRAA >89 04/06/2016 1700      Latest Ref Rng & Units 04/02/2024   10:26 AM 01/30/2024    9:43 AM 01/25/2024    4:13 AM  Hepatic Function  Total Protein 6.0 - 8.5 g/dL 7.0  7.1  5.7   Albumin  3.9 - 4.9 g/dL 4.2  4.2  2.8   AST 0 - 40 IU/L 56  50  47   ALT 0 - 32 IU/L 40  57  49   Alk Phosphatase 44 - 121 IU/L 224  220  162   Total Bilirubin 0.0 - 1.2 mg/dL <1.6  <1.0  0.3       Current Medications:      Current Outpatient Medications (Analgesics):    Galcanezumab -gnlm (EMGALITY ) 120 MG/ML SOAJ, Inject 1 Pen into the skin every 30 (thirty) days.   HYDROcodone -acetaminophen  (NORCO/VICODIN) 5-325 MG tablet, Take 1 tablet by mouth every 6 (six) hours  as needed for moderate pain (pain score 4-6).   Rimegepant Sulfate (NURTEC) 75 MG TBDP, Take 1 tablet (75 mg total) by mouth daily as needed. (Patient taking differently: Take 75 mg by mouth daily as needed (migraine).)  Current Outpatient Medications (Hematological):    apixaban  (ELIQUIS ) 5 MG TABS tablet, Take 1 tablet (5 mg total) by mouth 2 (two) times daily.   Ferrous Sulfate  (IRON PO), Take 1 tablet by mouth at bedtime.  Current Outpatient Medications (Other):    amphetamine -dextroamphetamine  (ADDERALL  XR) 30 MG 24 hr capsule, Take 30 mg by mouth daily.   cyclobenzaprine  (FLEXERIL ) 10 MG tablet, TAKE 1 TABLET BY MOUTH EVERY DAY AS NEEDED   DULoxetine  (CYMBALTA ) 60 MG capsule, Take 1 capsule (60 mg total) by mouth daily.   gabapentin  (NEURONTIN ) 400 MG capsule, Take 400 mg by mouth 3 (three)  times daily.   Melatonin 10 MG CAPS, Take 10 mg by mouth at bedtime.   Multiple Vitamin (MULTIVITAMIN) tablet, Take 1 tablet by mouth at bedtime.   Na Sulfate-K Sulfate-Mg Sulfate concentrate (SUPREP) 17.5-3.13-1.6 GM/177ML SOLN, Take 1 kit (354 mLs total) by mouth once for 1 dose.   Probiotic Product (FORTIFY PROBIOTIC WOMENS PO), Take 1 capsule by mouth at bedtime.   temazepam  (RESTORIL ) 15 MG capsule, TAKE 1 CAPSULE BY MOUTH AT BEDTIME AS NEEDED FOR SLEEP   VITAMIN D  PO, Take 1 capsule by mouth at bedtime.  Medical History:  Past Medical History:  Diagnosis Date   Abnormal Pap smear    CIN I   Anemia    Anxiety    Depression    off meds now, ok now   Enlarged thyroid  gland    nromal function   GERD (gastroesophageal reflux disease)    Migraine    Obesity    Ovarian cyst    Vaginal Pap smear, abnormal    ok now   Allergies:  Allergies  Allergen Reactions   Novocain [Procaine] Anaphylaxis   Nsaids Other (See Comments)    H/o gastric bypass = avoid NSAIDs   Povidone Iodine Rash    Rash     Surgical History:  She  has a past surgical history that includes Laparoscopic gastric  sleeve resection (2017); Gastric bypass (08/14/2022); laparoscopy (N/A, 12/05/2023); and Esophagogastroduodenoscopy (egd) with propofol  (N/A, 01/24/2024). Family History:  Her family history includes Arthritis in her mother; COPD in her paternal grandfather; Cancer in her maternal grandfather; Diabetes in her maternal uncle; Heart disease in her paternal grandmother; Hypertension in her maternal uncle; Leukemia in her maternal grandmother; Stroke in her maternal grandmother; Thyroid  disease in her mother.  REVIEW OF SYSTEMS  : All other systems reviewed and negative except where noted in the History of Present Illness.  PHYSICAL EXAM: BP (!) 140/90   Pulse 70   Ht 5' (1.524 m)   Wt 195 lb (88.5 kg)   LMP 04/09/2024 (Approximate)   BMI 38.08 kg/m  General Appearance: Well nourished, in no apparent distress. Respiratory: Respiratory effort normal, BS equal bilaterally without rales, rhonchi, wheezing. Cardio: RRR with no MRGs. Abdomen: Soft,  Obese ,active bowel sounds. mild tenderness in the LUQ. Without guarding and Without rebound. No masses. Rectal: Not evaluated Musculoskeletal: Full ROM, Normal gait Neuro: Alert and  oriented x4;  No focal deficits. Psych:  Cooperative. Normal mood and affect.    Edmonia Gottron, PA-C 11:14 AM

## 2024-04-30 ENCOUNTER — Telehealth: Payer: Self-pay | Admitting: *Deleted

## 2024-04-30 ENCOUNTER — Encounter: Payer: Self-pay | Admitting: Physician Assistant

## 2024-04-30 ENCOUNTER — Other Ambulatory Visit (INDEPENDENT_AMBULATORY_CARE_PROVIDER_SITE_OTHER)

## 2024-04-30 ENCOUNTER — Ambulatory Visit: Admitting: Physician Assistant

## 2024-04-30 VITALS — BP 140/90 | HR 70 | Ht 60.0 in | Wt 195.0 lb

## 2024-04-30 DIAGNOSIS — K219 Gastro-esophageal reflux disease without esophagitis: Secondary | ICD-10-CM

## 2024-04-30 DIAGNOSIS — D509 Iron deficiency anemia, unspecified: Secondary | ICD-10-CM | POA: Diagnosis not present

## 2024-04-30 DIAGNOSIS — K869 Disease of pancreas, unspecified: Secondary | ICD-10-CM

## 2024-04-30 DIAGNOSIS — R195 Other fecal abnormalities: Secondary | ICD-10-CM | POA: Diagnosis not present

## 2024-04-30 DIAGNOSIS — R1013 Epigastric pain: Secondary | ICD-10-CM

## 2024-04-30 DIAGNOSIS — Z9884 Bariatric surgery status: Secondary | ICD-10-CM

## 2024-04-30 DIAGNOSIS — Z86711 Personal history of pulmonary embolism: Secondary | ICD-10-CM

## 2024-04-30 DIAGNOSIS — R748 Abnormal levels of other serum enzymes: Secondary | ICD-10-CM | POA: Diagnosis not present

## 2024-04-30 DIAGNOSIS — R1012 Left upper quadrant pain: Secondary | ICD-10-CM

## 2024-04-30 DIAGNOSIS — R7401 Elevation of levels of liver transaminase levels: Secondary | ICD-10-CM

## 2024-04-30 DIAGNOSIS — Z9049 Acquired absence of other specified parts of digestive tract: Secondary | ICD-10-CM

## 2024-04-30 DIAGNOSIS — K8689 Other specified diseases of pancreas: Secondary | ICD-10-CM

## 2024-04-30 DIAGNOSIS — Z7901 Long term (current) use of anticoagulants: Secondary | ICD-10-CM

## 2024-04-30 LAB — HEPATIC FUNCTION PANEL
ALT: 31 U/L (ref 0–35)
AST: 34 U/L (ref 0–37)
Albumin: 3.9 g/dL (ref 3.5–5.2)
Alkaline Phosphatase: 185 U/L — ABNORMAL HIGH (ref 39–117)
Bilirubin, Direct: 0 mg/dL (ref 0.0–0.3)
Total Bilirubin: 0.2 mg/dL (ref 0.2–1.2)
Total Protein: 7.1 g/dL (ref 6.0–8.3)

## 2024-04-30 LAB — CBC WITH DIFFERENTIAL/PLATELET
Basophils Absolute: 0 10*3/uL (ref 0.0–0.1)
Basophils Relative: 0.7 % (ref 0.0–3.0)
Eosinophils Absolute: 0.2 10*3/uL (ref 0.0–0.7)
Eosinophils Relative: 2.6 % (ref 0.0–5.0)
HCT: 35.8 % — ABNORMAL LOW (ref 36.0–46.0)
Hemoglobin: 11.3 g/dL — ABNORMAL LOW (ref 12.0–15.0)
Lymphocytes Relative: 37.7 % (ref 12.0–46.0)
Lymphs Abs: 2.2 10*3/uL (ref 0.7–4.0)
MCHC: 31.6 g/dL (ref 30.0–36.0)
MCV: 86.4 fl (ref 78.0–100.0)
Monocytes Absolute: 0.4 10*3/uL (ref 0.1–1.0)
Monocytes Relative: 6.1 % (ref 3.0–12.0)
Neutro Abs: 3.1 10*3/uL (ref 1.4–7.7)
Neutrophils Relative %: 52.9 % (ref 43.0–77.0)
Platelets: 344 10*3/uL (ref 150.0–400.0)
RBC: 4.14 Mil/uL (ref 3.87–5.11)
RDW: 14.6 % (ref 11.5–15.5)
WBC: 5.9 10*3/uL (ref 4.0–10.5)

## 2024-04-30 LAB — BASIC METABOLIC PANEL WITH GFR
BUN: 8 mg/dL (ref 6–23)
CO2: 27 meq/L (ref 19–32)
Calcium: 9 mg/dL (ref 8.4–10.5)
Chloride: 105 meq/L (ref 96–112)
Creatinine, Ser: 0.68 mg/dL (ref 0.40–1.20)
GFR: 107.61 mL/min (ref >60.00)
Glucose, Bld: 109 mg/dL — ABNORMAL HIGH (ref 70–99)
Potassium: 4 meq/L (ref 3.5–5.1)
Sodium: 138 meq/L (ref 135–145)

## 2024-04-30 LAB — AMYLASE: Amylase: 89 U/L (ref 27–131)

## 2024-04-30 LAB — LIPASE: Lipase: 18 U/L (ref 11.0–59.0)

## 2024-04-30 MED ORDER — NA SULFATE-K SULFATE-MG SULF 17.5-3.13-1.6 GM/177ML PO SOLN
1.0000 | Freq: Once | ORAL | 0 refills | Status: AC
Start: 2024-04-30 — End: 2024-04-30

## 2024-04-30 NOTE — Patient Instructions (Addendum)
 Your provider has requested that you go to the basement level for lab work before leaving today. Press "B" on the elevator. The lab is located at the first door on the left as you exit the elevator.  IV Iron will be scheduled and you will be contacted  You will be contacted by [facility] Scheduling in the next 2 days to arrange a MRCP.  The number on your caller ID will be [phone], please answer when they call.  If you have not heard from them in 2 days please call [phone] to schedule.    You have been scheduled for a colonoscopy. Please follow written instructions given to you at your visit today.   If you use inhalers (even only as needed), please bring them with you on the day of your procedure.  DO NOT TAKE 7 DAYS PRIOR TO TEST- Trulicity (dulaglutide) Ozempic, Wegovy  (semaglutide ) Mounjaro (tirzepatide) Bydureon Bcise (exanatide extended release)  DO NOT TAKE 1 DAY PRIOR TO YOUR TEST Rybelsus (semaglutide ) Adlyxin (lixisenatide) Victoza (liraglutide) Byetta (exanatide) ___________________________________________________________________________    Due to recent changes in healthcare laws, you may see the results of your imaging and laboratory studies on MyChart before your provider has had a chance to review them.  We understand that in some cases there may be results that are confusing or concerning to you. Not all laboratory results come back in the same time frame and the provider may be waiting for multiple results in order to interpret others.  Please give us  48 hours in order for your provider to thoroughly review all the results before contacting the office for clarification of your results.    I appreciate the  opportunity to care for you  Thank You   [facility]

## 2024-04-30 NOTE — Telephone Encounter (Signed)
  Jessica Banks 03-18-1981 [id number]     Dear Dr Cleave Curling:  We have scheduled the above named patient for a(n) Colonoscopy/Endoscopy procedure. Our records show that (s)he is on anticoagulation therapy.  Please advise as to whether the patient may come off their therapy of Eliquis  2 days prior to their procedure which is scheduled for 05/28/2024.  Please route your response to Royanne Core CMA or fax response to [phone].  Sincerely,    [facility]

## 2024-05-01 ENCOUNTER — Ambulatory Visit (HOSPITAL_BASED_OUTPATIENT_CLINIC_OR_DEPARTMENT_OTHER)
Admission: RE | Admit: 2024-05-01 | Discharge: 2024-05-01 | Disposition: A | Discharge status: Home / Self Care | Payer: BC Managed Care – PPO | Source: Ambulatory Visit | Attending: Internal Medicine | Admitting: Internal Medicine

## 2024-05-01 ENCOUNTER — Encounter: Payer: Self-pay | Admitting: Internal Medicine

## 2024-05-01 ENCOUNTER — Telehealth: Payer: Self-pay

## 2024-05-01 ENCOUNTER — Ambulatory Visit: Payer: Self-pay | Admitting: Physician Assistant

## 2024-05-01 ENCOUNTER — Encounter: Payer: Self-pay | Admitting: Physician Assistant

## 2024-05-01 ENCOUNTER — Encounter (HOSPITAL_BASED_OUTPATIENT_CLINIC_OR_DEPARTMENT_OTHER): Payer: Self-pay

## 2024-05-01 ENCOUNTER — Other Ambulatory Visit: Payer: Self-pay | Admitting: Physician Assistant

## 2024-05-01 DIAGNOSIS — Z1231 Encounter for screening mammogram for malignant neoplasm of breast: Secondary | ICD-10-CM | POA: Insufficient documentation

## 2024-05-01 DIAGNOSIS — D509 Iron deficiency anemia, unspecified: Secondary | ICD-10-CM | POA: Insufficient documentation

## 2024-05-01 DIAGNOSIS — Z139 Encounter for screening, unspecified: Secondary | ICD-10-CM | POA: Diagnosis present

## 2024-05-01 MED ORDER — SODIUM CHLORIDE 0.9 % IV SOLN: 200.0000 mg | INTRAVENOUS | 0 refills | Status: AC

## 2024-05-01 NOTE — Telephone Encounter (Signed)
-----   Message from [facility] Robin S sent at 04/30/2024  3:11 PM EDT ----- Regarding: IV Iron Infusion Santina Cull wanted this patient to have IV Iron infusion ... 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 Thanks

## 2024-05-01 NOTE — Telephone Encounter (Signed)
 Jessica Banks, patient will be scheduled as soon as possible. Auth Submission: NO AUTH NEEDED Site of care: Site of care: [facility] Payer: [employer] commercial Medication & CPT/J Code(s) submitted: Venofer (Iron Sucrose) J1756 Route of submission (phone, fax, portal):  Phone # Fax # Auth type: Buy/Bill PB Units/visits requested: 200mg  x 5 doses Reference number:  Approval from: 05/01/24 to 09/01/24

## 2024-05-01 NOTE — Addendum Note (Signed)
 Addended by: Santina Cull on: 05/01/2024 12:20 PM   Modules accepted: Orders

## 2024-05-01 NOTE — Progress Notes (Signed)
 SCANNED FORMS.

## 2024-05-01 NOTE — Telephone Encounter (Signed)
 Feraheme orders are in epic. [facility] will contact patient to set up appt once insurance authorization is obtained.   MyChart message sent to patient.

## 2024-05-01 NOTE — Addendum Note (Signed)
 Addended by: Martin Smeal N on: 05/01/2024 08:40 AM   Modules accepted: Orders

## 2024-05-07 ENCOUNTER — Ambulatory Visit (HOSPITAL_COMMUNITY)
Admission: RE | Admit: 2024-05-07 | Discharge: 2024-05-07 | Disposition: A | Discharge status: Home / Self Care | Source: Ambulatory Visit | Attending: Physician Assistant | Admitting: Physician Assistant

## 2024-05-07 ENCOUNTER — Other Ambulatory Visit: Payer: Self-pay | Admitting: Physician Assistant

## 2024-05-07 DIAGNOSIS — K869 Disease of pancreas, unspecified: Secondary | ICD-10-CM | POA: Insufficient documentation

## 2024-05-07 MED ORDER — GADOBUTROL 1 MMOL/ML IV SOLN
9.0000 mL | Freq: Once | INTRAVENOUS | Status: AC | PRN
  Administered 2024-05-07: 9 mL via INTRAVENOUS

## 2024-05-07 NOTE — Telephone Encounter (Signed)
 Patient informed ok to HOLD Eliquis  2 days before her procedure

## 2024-05-10 ENCOUNTER — Ambulatory Visit (HOSPITAL_COMMUNITY): Admission: RE | Admit: 2024-05-10 | Source: Ambulatory Visit

## 2024-05-11 ENCOUNTER — Other Ambulatory Visit: Payer: Self-pay | Admitting: Internal Medicine

## 2024-05-13 ENCOUNTER — Other Ambulatory Visit: Payer: Self-pay | Admitting: Internal Medicine

## 2024-05-13 DIAGNOSIS — D509 Iron deficiency anemia, unspecified: Secondary | ICD-10-CM

## 2024-05-13 DIAGNOSIS — D75839 Thrombocytosis, unspecified: Secondary | ICD-10-CM

## 2024-05-13 DIAGNOSIS — Z01419 Encounter for gynecological examination (general) (routine) without abnormal findings: Secondary | ICD-10-CM

## 2024-05-13 MED ORDER — BUPROPION HCL ER (XL) 150 MG PO TB24: 150.0000 mg | ORAL_TABLET | ORAL | 2 refills | Status: DC

## 2024-05-14 ENCOUNTER — Ambulatory Visit

## 2024-05-14 VITALS — BP 114/65 | HR 82 | Temp 98.4°F | Resp 16 | Ht 60.0 in | Wt 196.6 lb

## 2024-05-14 DIAGNOSIS — R195 Other fecal abnormalities: Secondary | ICD-10-CM

## 2024-05-14 DIAGNOSIS — D509 Iron deficiency anemia, unspecified: Secondary | ICD-10-CM | POA: Diagnosis not present

## 2024-05-14 DIAGNOSIS — Z9884 Bariatric surgery status: Secondary | ICD-10-CM

## 2024-05-14 DIAGNOSIS — D649 Anemia, unspecified: Secondary | ICD-10-CM

## 2024-05-14 MED ORDER — IRON SUCROSE 20 MG/ML IV SOLN
200.0000 mg | Freq: Once | INTRAVENOUS | Status: AC
  Administered 2024-05-14: 200 mg via INTRAVENOUS
  Filled 2024-05-14: qty 10

## 2024-05-14 NOTE — Progress Notes (Signed)
 Diagnosis: Iron Deficiency Anemia  Provider:  Chilton Greathouse MD  Procedure: IV Push  IV Type: Peripheral, IV Location: R Antecubital  Venofer (Iron Sucrose), Dose: 200 mg  Post Infusion IV Care: Observation period completed and Peripheral IV Discontinued  Discharge: Condition: Good, Destination: Home . AVS Declined  Performed by:  Rico Ala, LPN

## 2024-05-19 ENCOUNTER — Telehealth: Payer: BC Managed Care – PPO | Admitting: Diagnostic Neuroimaging

## 2024-05-19 ENCOUNTER — Encounter: Payer: Self-pay | Admitting: Diagnostic Neuroimaging

## 2024-05-19 DIAGNOSIS — G43109 Migraine with aura, not intractable, without status migrainosus: Secondary | ICD-10-CM | POA: Diagnosis not present

## 2024-05-19 MED ORDER — NURTEC 75 MG PO TBDP: 75.0000 mg | ORAL_TABLET | Freq: Every day | ORAL | 6 refills | Status: DC | PRN

## 2024-05-19 MED ORDER — ELETRIPTAN HYDROBROMIDE 40 MG PO TABS: 40.0000 mg | ORAL_TABLET | ORAL | 6 refills | Status: AC | PRN

## 2024-05-19 MED ORDER — DULOXETINE HCL 60 MG PO CPEP: 60.0000 mg | ORAL_CAPSULE | Freq: Every day | ORAL | 4 refills | Status: AC

## 2024-05-19 MED ORDER — EMGALITY 120 MG/ML ~~LOC~~ SOAJ: 1.0000 | SUBCUTANEOUS | 6 refills | Status: AC

## 2024-05-19 NOTE — Patient Instructions (Signed)
  MIGRAINE PREVENTION  LIFESTYLE CHANGES -Stop or avoid smoking -Decrease or avoid caffeine  / alcohol -Eat and sleep on a regular schedule -Exercise several times per week - continue galazanezumab (Emgality ) 120mg  monthly - continue duloxetine  60mg  daily   MIGRAINE RESCUE  - ibuprofen , tylenol  as needed - cyclobenzaprine  as needed - fioricet  as needed (last resort; started by PCP) - rimegepant (Nurtec) 75mg  as needed for breakthrough headache; max 8 per month - start eletriptan (Relpax) 40mg  as needed for breakthrough headache; may repeat x 1 after 2 hours; max 2 tabs per day or 8 per month - next steps: consider Botox, alternate CGRP (ajovy, qulipta )

## 2024-05-19 NOTE — Progress Notes (Addendum)
 [facility]  PATIENT: Jessica Banks DOB: Feb 11, 1981  REFERRING CLINICIAN: Cleave Curling, MD HISTORY FROM: patient  REASON FOR VISIT: follow up   HISTORICAL  CHIEF COMPLAINT:  Chief Complaint  Patient presents with   Migraine    HISTORY OF PRESENT ILLNESS:   UPDATE (05/19/24, VRP): Since last visit, doing well. Symptoms are improved. No alleviating or aggravating factors. Tolerating meds. Emgality  helping. Nurtec not helping. Still using fioricet  (~5-10 per month).   UPDATE (11/19/23, VRP): Since last visit, doing better with HA; now 3-4 HA per month on emgality . Tried nurtec with mild benefit. Uses fioricet  as back up rescue. Sometimes has visual aura.   PRIOR HPI (03/16/23, Dr. Billy Bue): The patient presents for evaluation of headaches which began around age 43. She did have a normal MRI as a child. Had a period of time between adolescence and college where she did not have migraines. They then started to return after college. Headaches are associated with photophobia, phonophobia, and osmophobia. She currently averages 12 migraine days per month.   Previous took Nurtec which was helpful, but had to stop this during pregnancy and has not had a chance to restart it. Took Fioricet  during pregnancy which was helpful. Has also taken Flexeril  as needed for neck spasms. She has tried multiple preventives which she has failed due to lack of efficacy.   Headache History: Onset: age 48 Aura: no Associated Symptoms:             Photophobia: yes             Phonophobia: yes Worse with activity?: yes Duration of headaches: 2 days   Migraine days per month: 12 Headache free days per month: 18   Current Treatment: Abortive Fioricet  Flexeril    Preventative none   Prior Therapies                                 Rescue: Toradol  Fioricet  Flexeril  Imitrex Maxalt 10 mg PRN Zomig spray Nurtec - helpful Ubrelvy - lack of efficacy   Preventive: Propranolol -  lightheadedness Topamax - paresthesias Zonisamide Celexa Sertraline  Aimovig  - lack of efficacy Trigger point injections    REVIEW OF SYSTEMS: Full 14 system review of systems performed and negative with exception of: as per HPI/.  ALLERGIES: Allergies  Allergen Reactions   Novocain [Procaine] Anaphylaxis   Nsaids Other (See Comments)    H/o gastric bypass = avoid NSAIDs   Povidone Iodine Rash    Rash    HOME MEDICATIONS: Outpatient Medications Prior to Visit  Medication Sig Dispense Refill   amphetamine -dextroamphetamine  (ADDERALL  XR) 30 MG 24 hr capsule Take 30 mg by mouth daily.     apixaban  (ELIQUIS ) 5 MG TABS tablet Take 1 tablet (5 mg total) by mouth 2 (two) times daily. 60 tablet 2   buPROPion  (WELLBUTRIN  XL) 150 MG 24 hr tablet Take 1 tablet (150 mg total) by mouth every morning. 30 tablet 2   cyclobenzaprine  (FLEXERIL ) 10 MG tablet TAKE 1 TABLET BY MOUTH EVERY DAY AS NEEDED 30 tablet 1   DULoxetine  (CYMBALTA ) 60 MG capsule Take 1 capsule (60 mg total) by mouth daily. 90 capsule 4   gabapentin  (NEURONTIN ) 400 MG capsule Take 400 mg by mouth 3 (three) times daily.     Galcanezumab -gnlm (EMGALITY ) 120 MG/ML SOAJ Inject 1 Pen into the skin every 30 (thirty) days. 1.12 mL 6   HYDROcodone -acetaminophen  (NORCO/VICODIN) 5-325 MG tablet  Take 1 tablet by mouth every 6 (six) hours as needed for moderate pain (pain score 4-6). 20 tablet 0   Melatonin 10 MG CAPS Take 10 mg by mouth at bedtime.     Multiple Vitamin (MULTIVITAMIN) tablet Take 1 tablet by mouth at bedtime.     Probiotic Product (FORTIFY PROBIOTIC WOMENS PO) Take 1 capsule by mouth at bedtime.     Rimegepant Sulfate (NURTEC) 75 MG TBDP Take 1 tablet (75 mg total) by mouth daily as needed. (Patient taking differently: Take 75 mg by mouth daily as needed (migraine).) 8 tablet 6   temazepam  (RESTORIL ) 15 MG capsule TAKE 1 CAPSULE BY MOUTH AT BEDTIME AS NEEDED FOR SLEEP 30 capsule 2   VITAMIN D  PO Take 1 capsule by mouth at  bedtime.     No facility-administered medications prior to visit.      PHYSICAL EXAM  GENERAL EXAM/CONSTITUTIONAL: Vitals:  There were no vitals filed for this visit.  There is no height or weight on file to calculate BMI. Wt Readings from Last 3 Encounters:  05/14/24 196 lb 9.6 oz (89.2 kg)  04/30/24 195 lb (88.5 kg)  04/02/24 189 lb 4.8 oz (85.9 kg)   Patient is in no distress; well developed, nourished and groomed; neck is supple  CARDIOVASCULAR: Examination of carotid arteries is normal; no carotid bruits Regular rate and rhythm, no murmurs Examination of peripheral vascular system by observation and palpation is normal  EYES: Ophthalmoscopic exam of optic discs and posterior segments is normal; no papilledema or hemorrhages No results found.  MUSCULOSKELETAL: Gait, strength, tone, movements noted in Neurologic exam below  NEUROLOGIC: MENTAL STATUS:      No data to display         awake, alert, oriented to person, place and time recent and remote memory intact normal attention and concentration language fluent, comprehension intact, naming intact fund of knowledge appropriate  CRANIAL NERVE:  2nd - no papilledema on fundoscopic exam 2nd, 3rd, 4th, 6th - pupils equal and reactive to light, visual fields full to confrontation, extraocular muscles intact, no nystagmus 5th - facial sensation symmetric 7th - facial strength symmetric 8th - hearing intact 9th - palate elevates symmetrically, uvula midline 11th - shoulder shrug symmetric 12th - tongue protrusion midline  MOTOR:  normal bulk and tone, full strength in the BUE, BLE  SENSORY:  normal and symmetric to light touch, temperature, vibration  COORDINATION:  finger-nose-finger, fine finger movements normal  REFLEXES:  deep tendon reflexes TRACE and symmetric  GAIT/STATION:  narrow based gait     DIAGNOSTIC DATA (LABS, IMAGING, TESTING) - I reviewed patient records, labs, notes, testing  and imaging myself where available.  Lab Results  Component Value Date   WBC 5.9 04/30/2024   HGB 11.3 (L) 04/30/2024   HCT 35.8 (L) 04/30/2024   MCV 86.4 04/30/2024   PLT 344.0 04/30/2024      Component Value Date/Time   NA 138 04/30/2024 1149   NA 140 04/02/2024 1026   K 4.0 04/30/2024 1149   CL 105 04/30/2024 1149   CO2 27 04/30/2024 1149   GLUCOSE 109 (H) 04/30/2024 1149   BUN 8 04/30/2024 1149   BUN 11 04/02/2024 1026   CREATININE 0.68 04/30/2024 1149   CREATININE 0.60 04/06/2016 1700   CALCIUM  9.0 04/30/2024 1149   PROT 7.1 04/30/2024 1149   PROT 7.0 04/02/2024 1026   ALBUMIN  3.9 04/30/2024 1149   ALBUMIN  4.2 04/02/2024 1026   AST 34 04/30/2024 1149  ALT 31 04/30/2024 1149   ALKPHOS 185 (H) 04/30/2024 1149   BILITOT 0.2 04/30/2024 1149   BILITOT <0.2 04/02/2024 1026   GFRNONAA >60 01/26/2024 0508   GFRNONAA >89 04/06/2016 1700   GFRAA >60 04/23/2019 0105   GFRAA >89 04/06/2016 1700   Lab Results  Component Value Date   CHOL 168 04/02/2024   HDL 90 04/02/2024   LDLCALC 68 04/02/2024   TRIG 45 04/02/2024   CHOLHDL 1.9 04/02/2024   Lab Results  Component Value Date   HGBA1C 5.6 01/22/2024   Lab Results  Component Value Date   VITAMINB12 808 01/25/2024   Lab Results  Component Value Date   TSH 0.53 10/25/2022       ASSESSMENT AND PLAN  43 y.o. year old female here with history of gastric bypass, iron  deficiency who presents for evaluation of migraines.   Meds tried:                                Rescue: Toradol  Fioricet  Flexeril  Imitrex Maxalt 10 mg PRN Nurtec - helpful Ubrelvy - lack of efficacy   Preventive: Propranolol - lightheadedness Topamax - paresthesias Zonisamide Celexa Sertraline  Aimovig  - lack of efficacy Trigger point injections  Dx:  1. Migraine with aura and without status migrainosus, not intractable      PLAN:  MIGRAINE WITH AURA TREATMENT PLAN:  MIGRAINE PREVENTION  LIFESTYLE CHANGES -Stop or avoid  smoking -Decrease or avoid caffeine  / alcohol -Eat and sleep on a regular schedule -Exercise several times per week - continue galazanezumab (Emgality ) 120mg  monthly - continue duloxetine  60mg  daily   MIGRAINE RESCUE  - ibuprofen , tylenol  as needed - cyclobenzaprine  as needed - fioricet  as needed (last resort; started by PCP) - rimegepant (Nurtec) 75mg  as needed for breakthrough headache; max 8 per month - start eletriptan  (Relpax ) 40mg  as needed for breakthrough headache; may repeat x 1 after 2 hours; max 2 tabs per day or 8 per month - next steps: consider Botox, alternate CGRP (ajovy, qulipta )  Meds ordered this encounter  Medications   eletriptan  (RELPAX ) 40 MG tablet    Sig: Take 1 tablet (40 mg total) by mouth as needed for migraine or headache. May repeat in 2 hours if headache persists or recurs.    Dispense:  10 tablet    Refill:  6   Return in about 1 year (around 05/19/2025) for MyChart visit (15 min), with NP.  Virtual Visit via Video Note  I connected with Jessica Banks on 05/27/24 at  1:30 PM EDT by a video enabled telemedicine application and verified that I am speaking with the correct person using two identifiers.   I discussed the limitations of evaluation and management by telemedicine and the availability of in person appointments. The patient expressed understanding and agreed to proceed.  Patient is at home and I am at the office.   I spent 15 minutes of face-to-face and non-face-to-face time with patient.  This included previsit chart review, lab review, study review, order entry, electronic health record documentation, patient education.      Omega Bible, MD 05/19/2024, 2:19 PM Certified in Neurology, Neurophysiology and Neuroimaging  [facility] [address] [place], [place] [address] [phone]

## 2024-05-20 ENCOUNTER — Encounter: Payer: Self-pay | Admitting: Physician Assistant

## 2024-05-20 ENCOUNTER — Ambulatory Visit

## 2024-05-20 ENCOUNTER — Telehealth: Payer: Self-pay

## 2024-05-20 ENCOUNTER — Other Ambulatory Visit (HOSPITAL_COMMUNITY): Payer: Self-pay

## 2024-05-20 VITALS — BP 119/77 | HR 80 | Temp 98.5°F | Resp 18 | Ht 60.0 in | Wt 195.8 lb

## 2024-05-20 DIAGNOSIS — Z9884 Bariatric surgery status: Secondary | ICD-10-CM

## 2024-05-20 DIAGNOSIS — D509 Iron deficiency anemia, unspecified: Secondary | ICD-10-CM | POA: Diagnosis not present

## 2024-05-20 DIAGNOSIS — D649 Anemia, unspecified: Secondary | ICD-10-CM

## 2024-05-20 MED ORDER — SODIUM CHLORIDE 0.9 % IV SOLN
300.0000 mg | Freq: Once | INTRAVENOUS | 0 refills | Status: AC
Start: 2024-05-20 — End: 2024-05-20

## 2024-05-20 MED ORDER — IRON SUCROSE 20 MG/ML IV SOLN
200.0000 mg | Freq: Once | INTRAVENOUS | Status: AC
  Administered 2024-05-20: 200 mg via INTRAVENOUS
  Filled 2024-05-20: qty 10

## 2024-05-20 NOTE — Telephone Encounter (Signed)
 Pharmacy Patient Advocate Encounter  Received notification from [facility] that Prior Authorization for Nurtec 75MG  dispersible tablets has been APPROVED from 05/20/2024 to 05/20/2025. Ran test claim, Copay is $0. This test claim was processed through [facility]- copay amounts may vary at other pharmacies due to pharmacy/plan contracts, or as the patient moves through the different stages of their insurance plan.   PA #/Case ID/Reference #: PA Case ID #: [id number]  (Key: [id number])

## 2024-05-20 NOTE — Telephone Encounter (Signed)
 Kim, are you able to assist with this? We just need to do one more dose of Venofer  300 mg at upcoming appt.

## 2024-05-20 NOTE — Progress Notes (Signed)
 Diagnosis: Acute Anemia  Provider:  Praveen Mannam MD  Procedure: IV Push  IV Type: Peripheral, IV Location: R Antecubital  Venofer  (Iron  Sucrose), Dose: 200 mg  Post Infusion IV Care: Observation period completed  Discharge: Condition: Good, Destination: Home . AVS Declined  Performed by:  Rachelle Bue, RN

## 2024-05-20 NOTE — Telephone Encounter (Signed)
 Infusion orders have to be adjusted via therapy plan (infusion navigator). I will send [name] a message to see if she can help.

## 2024-05-21 ENCOUNTER — Encounter: Payer: Self-pay | Admitting: Pediatrics

## 2024-05-22 ENCOUNTER — Other Ambulatory Visit: Payer: Self-pay | Admitting: Internal Medicine

## 2024-05-23 ENCOUNTER — Ambulatory Visit

## 2024-05-26 NOTE — Progress Notes (Unsigned)
 [facility] History and Physical   Primary Care Physician:  Cleave Curling, MD   Reason for Procedure:   IDA, FOBT +, family history of colon cancer-brother  Plan:    Colonoscopy     HPI: Jessica Banks is a 43 y.o. female undergoing colonoscopy for investigation of iron  deficiency anemia, FOBT + and family history of colorectal cancer-brother diagnosed with colon cancer in his 33s.  Patient was previously admitted to the hospital in February 2025 with severe epigastric abdominal pain as well as PE.  Has had ongoing abdominal pain status post surgical revision of an internal hernia.  Had an unremarkable EGD in February 2025.  Due to ongoing anemia and a positive stool test for occult blood colonoscopy has been completed.   Past Medical History:  Diagnosis Date   Abnormal Pap smear    CIN I   Anemia    Anxiety    Depression    off meds now, ok now   Enlarged thyroid  gland    nromal function   GERD (gastroesophageal reflux disease)    Migraine    Obesity    Ovarian cyst    Vaginal Pap smear, abnormal    ok now    Past Surgical History:  Procedure Laterality Date   ESOPHAGOGASTRODUODENOSCOPY (EGD) WITH PROPOFOL  N/A 01/24/2024   Procedure: ESOPHAGOGASTRODUODENOSCOPY (EGD) WITH PROPOFOL ;  Surgeon: Truddie Furrow, MD;  Location: [facility];  Service: Gastroenterology;  Laterality: N/A;   GASTRIC BYPASS  08/14/2022   Revision of sleeve/hiatal hernia repair   LAPAROSCOPIC GASTRIC SLEEVE RESECTION  2017   LAPAROSCOPY N/A 12/05/2023   Procedure: LAPAROSCOPY DIAGNOSTIC, CHOLECYSTECOMY, INTERNAL HERNIA REPAIR;  Surgeon: Lujean Sake, MD;  Location: [facility]S;  Service: General;  Laterality: N/A;    Prior to Admission medications   Medication Sig Start Date End Date Taking? Authorizing Provider  amphetamine -dextroamphetamine  (ADDERALL  XR) 30 MG 24 hr capsule Take 30 mg by mouth daily.    [provider]  buPROPion  (WELLBUTRIN  XL) 150 MG 24 hr tablet  Take 1 tablet (150 mg total) by mouth every morning. 05/13/24 05/13/25  Cleave Curling, MD  cyclobenzaprine  (FLEXERIL ) 10 MG tablet TAKE 1 TABLET BY MOUTH EVERY DAY AS NEEDED 04/07/24   Penumalli, Brenton Cambridge, MD  DULoxetine  (CYMBALTA ) 60 MG capsule Take 1 capsule (60 mg total) by mouth daily. 05/19/24   Penumalli, Brenton Cambridge, MD  eletriptan  (RELPAX ) 40 MG tablet Take 1 tablet (40 mg total) by mouth as needed for migraine or headache. May repeat in 2 hours if headache persists or recurs. 05/19/24   Penumalli, Vikram R, MD  ELIQUIS  5 MG TABS tablet TAKE 1 TABLET BY MOUTH TWICE A DAY 05/22/24   Cleave Curling, MD  gabapentin  (NEURONTIN ) 400 MG capsule Take 400 mg by mouth 3 (three) times daily.    [provider]  Galcanezumab -gnlm (EMGALITY ) 120 MG/ML SOAJ Inject 1 Pen into the skin every 30 (thirty) days. 05/19/24   Penumalli, Vikram R, MD  HYDROcodone -acetaminophen  (NORCO/VICODIN) 5-325 MG tablet Take 1 tablet by mouth every 6 (six) hours as needed for moderate pain (pain score 4-6). 04/02/24   Cleave Curling, MD  Melatonin 10 MG CAPS Take 10 mg by mouth at bedtime.    [provider]  Multiple Vitamin (MULTIVITAMIN) tablet Take 1 tablet by mouth at bedtime.    [provider]  Probiotic Product (FORTIFY PROBIOTIC WOMENS PO) Take 1 capsule by mouth at bedtime.    [provider]  Rimegepant Sulfate (NURTEC) 75  MG TBDP Take 1 tablet (75 mg total) by mouth daily as needed (migraine). 05/19/24   Penumalli, Vikram R, MD  temazepam  (RESTORIL ) 15 MG capsule TAKE 1 CAPSULE BY MOUTH AT BEDTIME AS NEEDED FOR SLEEP 05/13/24   Cleave Curling, MD  VITAMIN D  PO Take 1 capsule by mouth at bedtime.    [provider]    Current Outpatient Medications  Medication Sig Dispense Refill   amphetamine -dextroamphetamine  (ADDERALL  XR) 30 MG 24 hr capsule Take 30 mg by mouth daily.     buPROPion  (WELLBUTRIN  XL) 150 MG 24 hr tablet Take 1 tablet (150 mg total) by mouth every morning. 30 tablet 2    cyclobenzaprine  (FLEXERIL ) 10 MG tablet TAKE 1 TABLET BY MOUTH EVERY DAY AS NEEDED 30 tablet 1   DULoxetine  (CYMBALTA ) 60 MG capsule Take 1 capsule (60 mg total) by mouth daily. 90 capsule 4   eletriptan  (RELPAX ) 40 MG tablet Take 1 tablet (40 mg total) by mouth as needed for migraine or headache. May repeat in 2 hours if headache persists or recurs. 10 tablet 6   ELIQUIS  5 MG TABS tablet TAKE 1 TABLET BY MOUTH TWICE A DAY 60 tablet 2   gabapentin  (NEURONTIN ) 400 MG capsule Take 400 mg by mouth 3 (three) times daily.     Galcanezumab -gnlm (EMGALITY ) 120 MG/ML SOAJ Inject 1 Pen into the skin every 30 (thirty) days. 1.12 mL 6   HYDROcodone -acetaminophen  (NORCO/VICODIN) 5-325 MG tablet Take 1 tablet by mouth every 6 (six) hours as needed for moderate pain (pain score 4-6). 20 tablet 0   Melatonin 10 MG CAPS Take 10 mg by mouth at bedtime.     Multiple Vitamin (MULTIVITAMIN) tablet Take 1 tablet by mouth at bedtime.     Probiotic Product (FORTIFY PROBIOTIC WOMENS PO) Take 1 capsule by mouth at bedtime.     Rimegepant Sulfate (NURTEC) 75 MG TBDP Take 1 tablet (75 mg total) by mouth daily as needed (migraine). 8 tablet 6   temazepam  (RESTORIL ) 15 MG capsule TAKE 1 CAPSULE BY MOUTH AT BEDTIME AS NEEDED FOR SLEEP 30 capsule 2   VITAMIN D  PO Take 1 capsule by mouth at bedtime.     No current facility-administered medications for this visit.    Allergies as of 05/28/2024 - Review Complete 05/19/2024  Allergen Reaction Noted   Novocain [procaine] Anaphylaxis 07/17/2013   Nsaids Other (See Comments) 12/15/2023   Povidone iodine Rash 09/05/2021    Family History  Problem Relation Age of Onset   Thyroid  disease Mother        s/p thyroidectomy   Arthritis Mother    Leukemia Maternal Grandmother    Stroke Maternal Grandmother    Cancer Maternal Grandfather        lung   Heart disease Paternal Grandmother    COPD Paternal Grandfather    Hypertension Maternal Uncle    Diabetes Maternal Uncle      Social History   Socioeconomic History   Marital status: Married    Spouse name: n/a   Number of children: 0   Years of education: master's   Highest education level: Master's degree (e.g., MA, MS, MEng, MEd, MSW, MBA)  Occupational History   Occupation: RN    Comment: [employer]   Occupation: NP    Comment: graduated/boards spring 2014  Tobacco Use   Smoking status: Former    Current packs/day: 0.00    Types: Cigarettes    Start date: 06/07/2009    Quit date: 06/07/2013  Years since quitting: 10.9   Smokeless tobacco: Never   Tobacco comments:    only smoked on the weekends  Vaping Use   Vaping status: Never Used  Substance and Sexual Activity   Alcohol use: Not Currently    Comment: SOCIAL    Drug use: No   Sexual activity: Not Currently  Other Topics Concern   Not on file  Social History Narrative   Not on file   Social Drivers of Health   Financial Resource Strain: Low Risk  (01/29/2024)   Overall Financial Resource Strain (CARDIA)    Difficulty of Paying Living Expenses: Not hard at all  Food Insecurity: No Food Insecurity (01/29/2024)   Hunger Vital Sign    Worried About Running Out of Food in the Last Year: Never true    Ran Out of Food in the Last Year: Never true  Transportation Needs: No Transportation Needs (01/29/2024)   PRAPARE - Administrator, Civil Service (Medical): No    Lack of Transportation (Non-Medical): No  Physical Activity: Insufficiently Active (01/29/2024)   Exercise Vital Sign    Days of Exercise per Week: 2 days    Minutes of Exercise per Session: 40 min  Stress: No Stress Concern Present (01/29/2024)   [employer] of Occupational Health - Occupational Stress Questionnaire    Feeling of Stress : Not at all  Social Connections: Socially Integrated (01/29/2024)   Social Connection and Isolation Panel [NHANES]    Frequency of Communication with Friends and Family: More than three times a week    Frequency of  Social Gatherings with Friends and Family: Once a week    Attends Religious Services: More than 4 times per year    Active Member of [employer] or Organizations: Yes    Attends Banker Meetings: 1 to 4 times per year    Marital Status: Married  Catering manager Violence: Not At Risk (01/22/2024)   Humiliation, Afraid, Rape, and Kick questionnaire    Fear of Current or Ex-Partner: No    Emotionally Abused: No    Physically Abused: No    Sexually Abused: No    Review of Systems:  All other review of systems negative except as mentioned in the HPI.  Physical Exam: Vital signs LMP 04/09/2024 (Approximate)   General:   Alert,  Well-developed, well-nourished, pleasant and cooperative in NAD Airway:  Mallampati  Lungs:  Clear throughout to auscultation.   Heart:  Regular rate and rhythm; no murmurs, clicks, rubs,  or gallops. Abdomen:  Soft, nontender and nondistended. Normal bowel sounds.   Neuro/Psych:  Normal mood and affect. A and O x 3  Eugenia Hess, MD [facility]

## 2024-05-28 ENCOUNTER — Ambulatory Visit: Admitting: Pediatrics

## 2024-05-28 ENCOUNTER — Encounter: Payer: Self-pay | Admitting: Pediatrics

## 2024-05-28 VITALS — BP 117/75 | HR 69 | Temp 97.7°F | Resp 20 | Ht 60.0 in | Wt 195.0 lb

## 2024-05-28 DIAGNOSIS — Z800 Family history of malignant neoplasm of digestive organs: Secondary | ICD-10-CM | POA: Diagnosis not present

## 2024-05-28 DIAGNOSIS — Q439 Congenital malformation of intestine, unspecified: Secondary | ICD-10-CM

## 2024-05-28 DIAGNOSIS — R195 Other fecal abnormalities: Secondary | ICD-10-CM

## 2024-05-28 DIAGNOSIS — D509 Iron deficiency anemia, unspecified: Secondary | ICD-10-CM | POA: Diagnosis not present

## 2024-05-28 DIAGNOSIS — K648 Other hemorrhoids: Secondary | ICD-10-CM

## 2024-05-28 MED ORDER — SODIUM CHLORIDE 0.9 % IV SOLN: 500.0000 mL | INTRAVENOUS | Status: DC

## 2024-05-28 NOTE — Patient Instructions (Signed)
 Discharge instructions given. Handout on Hemorrhoids. Resume previous medications. YOU HAD AN ENDOSCOPIC PROCEDURE TODAY AT THE [facility]:   Refer to the procedure report that was given to you for any specific questions about what was found during the examination.  If the procedure report does not answer your questions, please call your gastroenterologist to clarify.  If you requested that your care partner not be given the details of your procedure findings, then the procedure report has been included in a sealed envelope for you to review at your convenience later.  YOU SHOULD EXPECT: Some feelings of bloating in the abdomen. Passage of more gas than usual.  Walking can help get rid of the air that was put into your GI tract during the procedure and reduce the bloating. If you had a lower endoscopy (such as a colonoscopy or flexible sigmoidoscopy) you may notice spotting of blood in your stool or on the toilet paper. If you underwent a bowel prep for your procedure, you may not have a normal bowel movement for a few days.  Please Note:  You might notice some irritation and congestion in your nose or some drainage.  This is from the oxygen used during your procedure.  There is no need for concern and it should clear up in a day or so.  SYMPTOMS TO REPORT IMMEDIATELY:  Following lower endoscopy (colonoscopy or flexible sigmoidoscopy):  Excessive amounts of blood in the stool  Significant tenderness or worsening of abdominal pains  Swelling of the abdomen that is new, acute  Fever of 100F or higher    For urgent or emergent issues, a gastroenterologist can be reached at any hour by calling [phone]. Do not use MyChart messaging for urgent concerns.    DIET:  We do recommend a small meal at first, but then you may proceed to your regular diet.  Drink plenty of fluids but you should avoid alcoholic beverages for 24 hours.  ACTIVITY:  You should plan to take it easy for the  rest of today and you should NOT DRIVE or use heavy machinery until tomorrow (because of the sedation medicines used during the test).    FOLLOW UP: Our staff will call the number listed on your records the next business day following your procedure.  We will call around 7:15- 8:00 am to check on you and address any questions or concerns that you may have regarding the information given to you following your procedure. If we do not reach you, we will leave a message.     If any biopsies were taken you will be contacted by phone or by letter within the next 1-3 weeks.  Please call us at [phone] if you have not heard about the biopsies in 3 weeks.    SIGNATURES/CONFIDENTIALITY: You and/or your care partner have signed paperwork which will be entered into your electronic medical record.  These signatures attest to the fact that that the information above on your After Visit Summary has been reviewed and is understood.  Full responsibility of the confidentiality of this discharge information lies with you and/or your care-partner.

## 2024-05-28 NOTE — Op Note (Signed)
 [facility] Patient Name: Jessica Banks Procedure Date: 05/28/2024 9:49 AM MRN: [id number] Endoscopist: Eugenia Hess , MD, [id number] Age: 43 Referring MD:  Date of Birth: [birth year] Gender: Female Account #: [id number] Procedure:                Colonoscopy Indications:              This is the patient's first colonoscopy, Iron                             deficiency anemia, FOBT positive, Family history of                            colorectal cancer and a half brother Procedure:                Pre-Anesthesia Assessment:                           - Prior to the procedure, a History and Physical                            was performed, and patient medications and                            allergies were reviewed. The patient's tolerance of                            previous anesthesia was also reviewed. The risks                            and benefits of the procedure and the sedation                            options and risks were discussed with the patient.                            All questions were answered, and informed consent                            was obtained. Prior Anticoagulants: The patient has                            taken Eliquis  (apixaban ), last dose was 2 days                            prior to procedure. ASA Grade Assessment: II - A                            patient with mild systemic disease. After reviewing                            the risks and benefits, the patient was deemed in                            satisfactory  condition to undergo the procedure.                           After obtaining informed consent, the colonoscope                            was passed under direct vision. Throughout the                            procedure, the patient's blood pressure, pulse, and                            oxygen saturations were monitored continuously. The                            CF [id number] #[id number] was introduced through the anus                             and advanced to the terminal ileum. The colonoscopy                            was performed without difficulty. The patient                            tolerated the procedure well. The quality of the                            bowel preparation was good. The terminal ileum,                            ileocecal valve, appendiceal orifice, and rectum                            were photographed. Scope In: 9:57:07 AM Scope Out: 10:15:36 AM Scope Withdrawal Time: 0 hours 11 minutes 38 seconds  Total Procedure Duration: 0 hours 18 minutes 29 seconds  Findings:                 The perianal and digital rectal examinations were                            normal. Pertinent negatives include normal                            sphincter tone and no palpable rectal lesions.                           The colon (entire examined portion) appeared normal.                           The left colon was moderately tortuous.                           The terminal ileum appeared normal.  Internal hemorrhoids were found during retroflexion. Complications:            No immediate complications. Estimated blood loss:                            None. Estimated Blood Loss:     Estimated blood loss: none. Impression:               - The entire examined colon is normal.                           - Tortuous colon.                           - The examined portion of the ileum was normal.                           - Internal hemorrhoids.                           - No specimens collected. Recommendation:           - Discharge patient to home (ambulatory).                           - Repeat colonoscopy in 5 years for screening                            purposes.                           - The findings and recommendations were discussed                            with the patient's family.                           - Patient has a contact number available for                             emergencies. The signs and symptoms of potential                            delayed complications were discussed with the                            patient. Return to normal activities tomorrow.                            Written discharge instructions were provided to the                            patient. Eugenia Hess, MD 05/28/2024 10:20:22 AM This report has been signed electronically.

## 2024-05-28 NOTE — Progress Notes (Signed)
 Vss nad trans to pacu

## 2024-05-29 ENCOUNTER — Telehealth: Payer: Self-pay | Admitting: Lactation Services

## 2024-05-29 NOTE — Telephone Encounter (Signed)
   Follow up Call-     05/28/2024    9:09 AM  Call back number  Post procedure Call Back phone  # [phone]  Permission to leave phone message Yes     Patient questions:  Do you have a fever, pain , or abdominal swelling? No. Pain Score  0 *  Have you tolerated food without any problems? Yes.    Have you been able to return to your normal activities? Yes.    Do you have any questions about your discharge instructions: Diet   Yes.   Medications  No. Follow up visit  Yes.    Do you have questions or concerns about your Care? Yes.    Actions: * If pain score is 4 or above: No action needed, pain <4.

## 2024-05-30 ENCOUNTER — Inpatient Hospital Stay

## 2024-05-30 ENCOUNTER — Inpatient Hospital Stay: Admitting: Family

## 2024-05-30 ENCOUNTER — Ambulatory Visit (INDEPENDENT_AMBULATORY_CARE_PROVIDER_SITE_OTHER): Admitting: *Deleted

## 2024-05-30 VITALS — BP 121/79 | HR 79 | Temp 97.9°F | Resp 16 | Ht 60.0 in | Wt 197.8 lb

## 2024-05-30 DIAGNOSIS — D649 Anemia, unspecified: Secondary | ICD-10-CM

## 2024-05-30 DIAGNOSIS — D509 Iron deficiency anemia, unspecified: Secondary | ICD-10-CM

## 2024-05-30 DIAGNOSIS — Z9884 Bariatric surgery status: Secondary | ICD-10-CM | POA: Diagnosis not present

## 2024-05-30 MED ORDER — IRON SUCROSE 20 MG/ML IV SOLN
200.0000 mg | Freq: Once | INTRAVENOUS | Status: AC
  Administered 2024-05-30: 200 mg via INTRAVENOUS
  Filled 2024-05-30: qty 10

## 2024-05-30 NOTE — Progress Notes (Signed)
 Diagnosis: Iron Deficiency Anemia  Provider:  Chilton Greathouse MD  Procedure: IV Push  IV Type: Peripheral, IV Location: R Antecubital  Venofer (Iron Sucrose), Dose: 200 mg  Post Infusion IV Care: Observation period completed and Peripheral IV Discontinued  Discharge: Condition: Good, Destination: Home . AVS Declined  Performed by:  Forrest Moron, RN

## 2024-06-02 ENCOUNTER — Ambulatory Visit

## 2024-06-02 ENCOUNTER — Other Ambulatory Visit: Payer: Self-pay | Admitting: Family

## 2024-06-02 ENCOUNTER — Encounter: Payer: Self-pay | Admitting: Family

## 2024-06-02 ENCOUNTER — Inpatient Hospital Stay: Admitting: Family

## 2024-06-02 ENCOUNTER — Inpatient Hospital Stay: Attending: Hematology & Oncology

## 2024-06-02 VITALS — BP 120/81 | HR 80 | Temp 97.7°F | Resp 16 | Ht 60.0 in | Wt 196.6 lb

## 2024-06-02 VITALS — BP 123/75 | HR 74 | Temp 98.9°F | Resp 18 | Ht 60.0 in | Wt 195.8 lb

## 2024-06-02 DIAGNOSIS — D649 Anemia, unspecified: Secondary | ICD-10-CM

## 2024-06-02 DIAGNOSIS — Z7901 Long term (current) use of anticoagulants: Secondary | ICD-10-CM | POA: Diagnosis not present

## 2024-06-02 DIAGNOSIS — Z87891 Personal history of nicotine dependence: Secondary | ICD-10-CM | POA: Diagnosis not present

## 2024-06-02 DIAGNOSIS — Z886 Allergy status to analgesic agent status: Secondary | ICD-10-CM | POA: Diagnosis not present

## 2024-06-02 DIAGNOSIS — K219 Gastro-esophageal reflux disease without esophagitis: Secondary | ICD-10-CM | POA: Diagnosis not present

## 2024-06-02 DIAGNOSIS — Z833 Family history of diabetes mellitus: Secondary | ICD-10-CM | POA: Diagnosis not present

## 2024-06-02 DIAGNOSIS — Z8261 Family history of arthritis: Secondary | ICD-10-CM | POA: Insufficient documentation

## 2024-06-02 DIAGNOSIS — I2699 Other pulmonary embolism without acute cor pulmonale: Secondary | ICD-10-CM

## 2024-06-02 DIAGNOSIS — D509 Iron deficiency anemia, unspecified: Secondary | ICD-10-CM | POA: Insufficient documentation

## 2024-06-02 DIAGNOSIS — F419 Anxiety disorder, unspecified: Secondary | ICD-10-CM | POA: Diagnosis not present

## 2024-06-02 DIAGNOSIS — R5383 Other fatigue: Secondary | ICD-10-CM | POA: Diagnosis not present

## 2024-06-02 DIAGNOSIS — R002 Palpitations: Secondary | ICD-10-CM

## 2024-06-02 DIAGNOSIS — Z9884 Bariatric surgery status: Secondary | ICD-10-CM | POA: Diagnosis not present

## 2024-06-02 DIAGNOSIS — Z823 Family history of stroke: Secondary | ICD-10-CM | POA: Insufficient documentation

## 2024-06-02 DIAGNOSIS — K648 Other hemorrhoids: Secondary | ICD-10-CM | POA: Diagnosis not present

## 2024-06-02 DIAGNOSIS — Z6838 Body mass index (BMI) 38.0-38.9, adult: Secondary | ICD-10-CM | POA: Diagnosis not present

## 2024-06-02 DIAGNOSIS — Z8249 Family history of ischemic heart disease and other diseases of the circulatory system: Secondary | ICD-10-CM | POA: Insufficient documentation

## 2024-06-02 DIAGNOSIS — E669 Obesity, unspecified: Secondary | ICD-10-CM | POA: Insufficient documentation

## 2024-06-02 DIAGNOSIS — Z79899 Other long term (current) drug therapy: Secondary | ICD-10-CM

## 2024-06-02 DIAGNOSIS — Z825 Family history of asthma and other chronic lower respiratory diseases: Secondary | ICD-10-CM

## 2024-06-02 DIAGNOSIS — Z8349 Family history of other endocrine, nutritional and metabolic diseases: Secondary | ICD-10-CM | POA: Diagnosis not present

## 2024-06-02 DIAGNOSIS — Z801 Family history of malignant neoplasm of trachea, bronchus and lung: Secondary | ICD-10-CM | POA: Diagnosis not present

## 2024-06-02 DIAGNOSIS — Z806 Family history of leukemia: Secondary | ICD-10-CM

## 2024-06-02 LAB — CBC WITH DIFFERENTIAL (CANCER CENTER ONLY)
Abs Immature Granulocytes: 0.01 10*3/uL (ref 0.00–0.07)
Basophils Absolute: 0 10*3/uL (ref 0.0–0.1)
Basophils Relative: 1 %
Eosinophils Absolute: 0.1 10*3/uL (ref 0.0–0.5)
Eosinophils Relative: 2 %
HCT: 37.1 % (ref 36.0–46.0)
Hemoglobin: 11.4 g/dL — ABNORMAL LOW (ref 12.0–15.0)
Immature Granulocytes: 0 %
Lymphocytes Relative: 46 %
Lymphs Abs: 2.4 10*3/uL (ref 0.7–4.0)
MCH: 27.7 pg (ref 26.0–34.0)
MCHC: 30.7 g/dL (ref 30.0–36.0)
MCV: 90 fL (ref 80.0–100.0)
Monocytes Absolute: 0.4 10*3/uL (ref 0.1–1.0)
Monocytes Relative: 7 %
Neutro Abs: 2.3 10*3/uL (ref 1.7–7.7)
Neutrophils Relative %: 44 %
Platelet Count: 340 10*3/uL (ref 150–400)
RBC: 4.12 MIL/uL (ref 3.87–5.11)
RDW: 16.7 % — ABNORMAL HIGH (ref 11.5–15.5)
WBC Count: 5.2 10*3/uL (ref 4.0–10.5)
nRBC: 0 % (ref 0.0–0.2)

## 2024-06-02 LAB — RETICULOCYTES
Immature Retic Fract: 7.3 % (ref 2.3–15.9)
RBC.: 4.08 MIL/uL (ref 3.87–5.11)
Retic Count, Absolute: 60.4 10*3/uL (ref 19.0–186.0)
Retic Ct Pct: 1.5 % (ref 0.4–3.1)

## 2024-06-02 LAB — FERRITIN: Ferritin: 296 ng/mL (ref 11–307)

## 2024-06-02 MED ORDER — IRON SUCROSE 20 MG/ML IV SOLN
200.0000 mg | Freq: Once | INTRAVENOUS | Status: AC
  Administered 2024-06-02: 200 mg via INTRAVENOUS
  Filled 2024-06-02: qty 10

## 2024-06-02 NOTE — Progress Notes (Unsigned)
 Hematology/Oncology Consultation   Name: Jessica Banks      MRN: [id number]    Location: Room/bed info not found  Date: 06/02/2024 Time:3:23 PM   REFERRING PHYSICIAN:  Cleave Curling, MD  REASON FOR CONSULT:  Iron  deficiency anemia    DIAGNOSIS: Iron  deficiency anemia   HISTORY OF PRESENT ILLNESS:  ***  ROS: All other 10 point review of systems is negative.   PAST MEDICAL HISTORY:   Past Medical History:  Diagnosis Date   Abnormal Pap smear    CIN I   Anemia    Anxiety    Depression    off meds now, ok now   Enlarged thyroid  gland    nromal function   GERD (gastroesophageal reflux disease)    Migraine    Obesity    Ovarian cyst    Vaginal Pap smear, abnormal    ok now    ALLERGIES: Allergies  Allergen Reactions   Novocain [Procaine] Anaphylaxis   Nsaids Other (See Comments)    H/o gastric bypass = avoid NSAIDs   Povidone Iodine Rash    Rash      MEDICATIONS:  Current Outpatient Medications on File Prior to Visit  Medication Sig Dispense Refill   amphetamine -dextroamphetamine  (ADDERALL  XR) 30 MG 24 hr capsule Take 30 mg by mouth daily.     buPROPion  (WELLBUTRIN  XL) 150 MG 24 hr tablet Take 1 tablet (150 mg total) by mouth every morning. 30 tablet 2   cyclobenzaprine  (FLEXERIL ) 10 MG tablet TAKE 1 TABLET BY MOUTH EVERY DAY AS NEEDED 30 tablet 1   DULoxetine  (CYMBALTA ) 60 MG capsule Take 1 capsule (60 mg total) by mouth daily. 90 capsule 4   eletriptan  (RELPAX ) 40 MG tablet Take 1 tablet (40 mg total) by mouth as needed for migraine or headache. May repeat in 2 hours if headache persists or recurs. 10 tablet 6   ELIQUIS  5 MG TABS tablet TAKE 1 TABLET BY MOUTH TWICE A DAY 60 tablet 2   Ferrous Sulfate  Dried 143 (45 Fe) MG TBCR Take 45 mg of iron  by mouth.     gabapentin  (NEURONTIN ) 400 MG capsule Take 400 mg by mouth 3 (three) times daily.     Galcanezumab -gnlm (EMGALITY ) 120 MG/ML SOAJ Inject 1 Pen into the skin every 30 (thirty) days. 1.12 mL 6    HYDROcodone -acetaminophen  (NORCO/VICODIN) 5-325 MG tablet Take 1 tablet by mouth every 6 (six) hours as needed for moderate pain (pain score 4-6). 20 tablet 0   Melatonin 10 MG CAPS Take 10 mg by mouth at bedtime.     Multiple Vitamin (MULTIVITAMIN) tablet Take 1 tablet by mouth at bedtime.     Probiotic Product (FORTIFY PROBIOTIC WOMENS PO) Take 1 capsule by mouth at bedtime.     Rimegepant Sulfate (NURTEC) 75 MG TBDP Take 1 tablet (75 mg total) by mouth daily as needed (migraine). 8 tablet 6   temazepam  (RESTORIL ) 15 MG capsule TAKE 1 CAPSULE BY MOUTH AT BEDTIME AS NEEDED FOR SLEEP 30 capsule 2   VITAMIN D  PO Take 1 capsule by mouth at bedtime.     No current facility-administered medications on file prior to visit.     PAST SURGICAL HISTORY Past Surgical History:  Procedure Laterality Date   ESOPHAGOGASTRODUODENOSCOPY (EGD) WITH PROPOFOL  N/A 01/24/2024   Procedure: ESOPHAGOGASTRODUODENOSCOPY (EGD) WITH PROPOFOL ;  Surgeon: Truddie Furrow, MD;  Location: [facility] ENDOSCOPY;  Service: Gastroenterology;  Laterality: N/A;   GASTRIC BYPASS  08/14/2022   Revision of sleeve/hiatal hernia repair  LAPAROSCOPIC GASTRIC SLEEVE RESECTION  2017   LAPAROSCOPY N/A 12/05/2023   Procedure: LAPAROSCOPY DIAGNOSTIC, CHOLECYSTECOMY, INTERNAL HERNIA REPAIR;  Surgeon: Lujean Sake, MD;  Location: [facility] ORS;  Service: General;  Laterality: N/A;    FAMILY HISTORY: Family History  Problem Relation Age of Onset   Thyroid  disease Mother        s/p thyroidectomy   Arthritis Mother    Leukemia Maternal Grandmother    Stroke Maternal Grandmother    Cancer Maternal Grandfather        lung   Heart disease Paternal Grandmother    COPD Paternal Grandfather    Hypertension Maternal Uncle    Diabetes Maternal Uncle     SOCIAL HISTORY:  reports that she quit smoking about 10 years ago. Her smoking use included cigarettes. She started smoking about 14 years ago. She has never used smokeless tobacco. She reports that  she does not currently use alcohol. She reports that she does not use drugs.  PERFORMANCE STATUS: The patient's performance status is {CHL ONC ECOG ZO:[id number]}  PHYSICAL EXAM: Most Recent Vital Signs: There were no vitals taken for this visit. {Exam, Complete:17964}  LABORATORY DATA:  Results for orders placed or performed in visit on 06/02/24 (from the past 48 hours)  CBC with Differential (Cancer Center Only)     Status: Abnormal   Collection Time: 06/02/24  3:12 PM  Result Value Ref Range   WBC Count 5.2 4.0 - 10.5 K/uL   RBC 4.12 3.87 - 5.11 MIL/uL   Hemoglobin 11.4 (L) 12.0 - 15.0 g/dL   HCT 81.1 91.4 - 78.2 %   MCV 90.0 80.0 - 100.0 fL   MCH 27.7 26.0 - 34.0 pg   MCHC 30.7 30.0 - 36.0 g/dL   RDW 95.6 (H) 21.3 - 08.6 %   Platelet Count 340 150 - 400 K/uL   nRBC 0.0 0.0 - 0.2 %   Neutrophils Relative % 44 %   Neutro Abs 2.3 1.7 - 7.7 K/uL   Lymphocytes Relative 46 %   Lymphs Abs 2.4 0.7 - 4.0 K/uL   Monocytes Relative 7 %   Monocytes Absolute 0.4 0.1 - 1.0 K/uL   Eosinophils Relative 2 %   Eosinophils Absolute 0.1 0.0 - 0.5 K/uL   Basophils Relative 1 %   Basophils Absolute 0.0 0.0 - 0.1 K/uL   Immature Granulocytes 0 %   Abs Immature Granulocytes 0.01 0.00 - 0.07 K/uL    Comment: Performed at [facility], [address] [place], [place] [address]      RADIOGRAPHY: No results found.     PATHOLOGY: None   ASSESSMENT/PLAN:   All questions were answered. The patient knows to call the clinic with any problems, questions or concerns. We can certainly see the patient much sooner if necessary.  The patient was discussed with Dr. Maria Shiner and he is in agreement with the aforementioned.   Kennard Pea, NP

## 2024-06-02 NOTE — Progress Notes (Signed)
 Diagnosis: Iron  Deficiency Anemia  Provider:  Praveen Mannam MD  Procedure: IV Push  IV Type: Peripheral, IV Location: L Antecubital  Venofer  (Iron  Sucrose), Dose: 200 mg  Post Infusion IV Care: Observation period completed and Peripheral IV Discontinued Stayed only a 5 minute observation.   Discharge: Condition: Good, Destination: Home . AVS Declined  Performed by:  Star East, LPN

## 2024-06-03 LAB — IRON AND IRON BINDING CAPACITY (CC-WL,HP ONLY)
Iron: 322 ug/dL — ABNORMAL HIGH (ref 28–170)
Saturation Ratios: 89 % — ABNORMAL HIGH (ref 10.4–31.8)
TIBC: 363 ug/dL (ref 250–450)
UIBC: 41 ug/dL

## 2024-06-04 ENCOUNTER — Encounter: Payer: Self-pay | Admitting: Physician Assistant

## 2024-06-04 ENCOUNTER — Other Ambulatory Visit: Payer: Self-pay | Admitting: Internal Medicine

## 2024-06-05 ENCOUNTER — Encounter (HOSPITAL_BASED_OUTPATIENT_CLINIC_OR_DEPARTMENT_OTHER): Payer: Self-pay

## 2024-06-05 ENCOUNTER — Inpatient Hospital Stay

## 2024-06-05 ENCOUNTER — Ambulatory Visit (HOSPITAL_BASED_OUTPATIENT_CLINIC_OR_DEPARTMENT_OTHER)
Admission: RE | Admit: 2024-06-05 | Discharge: 2024-06-05 | Discharge status: Home / Self Care | Source: Ambulatory Visit | Attending: Family

## 2024-06-05 DIAGNOSIS — I2699 Other pulmonary embolism without acute cor pulmonale: Secondary | ICD-10-CM | POA: Insufficient documentation

## 2024-06-05 DIAGNOSIS — D509 Iron deficiency anemia, unspecified: Secondary | ICD-10-CM | POA: Diagnosis not present

## 2024-06-05 MED ORDER — IOHEXOL 350 MG/ML SOLN
100.0000 mL | Freq: Once | INTRAVENOUS | Status: AC | PRN
  Administered 2024-06-05: 75 mL via INTRAVENOUS

## 2024-06-06 ENCOUNTER — Other Ambulatory Visit: Payer: Self-pay | Admitting: Family

## 2024-06-06 ENCOUNTER — Ambulatory Visit: Payer: Self-pay

## 2024-06-06 DIAGNOSIS — I2699 Other pulmonary embolism without acute cor pulmonale: Secondary | ICD-10-CM

## 2024-06-06 LAB — DRVVT MIX: dRVVT Mix: 43.4 s — ABNORMAL HIGH (ref 0.0–40.4)

## 2024-06-06 LAB — DRVVT CONFIRM: dRVVT Confirm: 1 ratio (ref 0.8–1.2)

## 2024-06-06 LAB — BETA-2-GLYCOPROTEIN I ABS, IGG/M/A
Beta-2 Glyco I IgG: <9 GPI IgG units (ref 0–20)
Beta-2-Glycoprotein I IgA: <9 GPI IgA units (ref 0–25)
Beta-2-Glycoprotein I IgM: <9 GPI IgM units (ref 0–32)

## 2024-06-06 LAB — PROTEIN C ACTIVITY: Protein C Activity: 113 % (ref 73–180)

## 2024-06-06 LAB — PROTEIN S ACTIVITY: Protein S Activity: 79 % (ref 63–140)

## 2024-06-06 LAB — HOMOCYSTEINE: Homocysteine: 7.2 umol/L (ref 0.0–14.5)

## 2024-06-06 LAB — ANTITHROMBIN III: AntiThromb III Func: 133 % — ABNORMAL HIGH (ref 75–120)

## 2024-06-06 LAB — LUPUS ANTICOAGULANT PANEL
DRVVT: 51 s — ABNORMAL HIGH (ref 0.0–47.0)
PTT Lupus Anticoagulant: 37.5 s (ref 0.0–43.5)

## 2024-06-06 LAB — PROTEIN S, TOTAL: Protein S Ag, Total: 80 % (ref 60–150)

## 2024-06-07 LAB — CARDIOLIPIN ANTIBODIES, IGG, IGM, IGA
Anticardiolipin IgA: 9 U/mL (ref 0–11)
Anticardiolipin IgG: <9 GPL U/mL (ref 0–14)
Anticardiolipin IgM: 10 [MPL'U]/mL (ref 0–12)

## 2024-06-07 LAB — PROTEIN C, TOTAL: Protein C, Total: 86 % (ref 60–150)

## 2024-06-09 LAB — PROTHROMBIN GENE MUTATION

## 2024-06-09 LAB — FACTOR 5 LEIDEN

## 2024-06-23 ENCOUNTER — Other Ambulatory Visit (HOSPITAL_COMMUNITY): Payer: Self-pay

## 2024-07-07 ENCOUNTER — Ambulatory Visit: Admitting: Internal Medicine

## 2024-07-07 VITALS — BP 126/80 | HR 85 | Temp 98.3°F | Ht 60.0 in | Wt 194.7 lb

## 2024-07-07 DIAGNOSIS — E041 Nontoxic single thyroid nodule: Secondary | ICD-10-CM

## 2024-07-07 DIAGNOSIS — R748 Abnormal levels of other serum enzymes: Secondary | ICD-10-CM

## 2024-07-07 DIAGNOSIS — K869 Disease of pancreas, unspecified: Secondary | ICD-10-CM

## 2024-07-07 DIAGNOSIS — G43709 Chronic migraine without aura, not intractable, without status migrainosus: Secondary | ICD-10-CM | POA: Diagnosis not present

## 2024-07-07 DIAGNOSIS — F909 Attention-deficit hyperactivity disorder, unspecified type: Secondary | ICD-10-CM | POA: Diagnosis not present

## 2024-07-07 DIAGNOSIS — D508 Other iron deficiency anemias: Secondary | ICD-10-CM

## 2024-07-07 MED ORDER — TEMAZEPAM 15 MG PO CAPS: ORAL_CAPSULE | ORAL | 1 refills | Status: DC

## 2024-07-07 NOTE — Progress Notes (Signed)
 I,[name], CMA,acting as a Neurosurgeon for [name], MD.,have documented all relevant documentation on the behalf of [name], MD,as directed by  [name], MD while in the presence of [name], MD.  Subjective:  Patient ID: Jessica Banks , female    DOB: 09-05-1981 , 43 y.o.   MRN: [id number]  Chief Complaint  Patient presents with   ADHD    Patient presents today for ADD follow up. She reports compliance with medications. Denies headache, chest pain & sob. She has an appointment next month with GYN.     HPI Discussed the use of AI scribe software for clinical note transcription with the patient, who gave verbal consent to proceed.  History of Present Illness Jessica Banks is a 43 year old female who presents for a follow-up visit regarding her ADD and migraines.  Her ADD medication regimen has been stable for a few months, and she feels it is effective.  She experiences frequent migraines and has tried various treatments over the years. Currently, she uses Emgality  and feels that her headaches have slowed down in frequency. She finds Fioricet  effective but reports that her neurologist is reluctant to prescribe it. She has tried Nurtec and is currently trying Relpax , with plans to use Fioricet  if these do not alleviate her headaches. She also uses Flexeril  for tension headaches, described as a pulling sensation in the back of her neck.  She recently completed iron  infusions and is under the care of a hematologist. She will return next month for blood work to monitor her iron  levels. Her previous CT scan showed resolution of her pulmonary embolism, and she remains on Eliquis .  She has a history of a stable pancreatic cyst and underwent a colonoscopy recently. She reports regular bowel movements despite being told she was constipated.  She is concerned about her neck appearing larger and wonders if it could be due to weight loss.  She reports  that her deodorant is less effective than before, which she attributes to stress. She uses a loofah and witch hazel for her underarm care and is considering changing her deodorant but is hesitant due to cost.  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   Past Medical History:  Diagnosis Date   Abnormal Pap smear    CIN I   Anemia    Anxiety    Depression    off meds now, ok now   Enlarged thyroid  gland    nromal function   GERD (gastroesophageal reflux disease)    Migraine    Obesity    Ovarian cyst    Vaginal Pap smear, abnormal    ok now     Family History  Problem Relation Age of Onset   Thyroid  disease Mother        s/p thyroidectomy   Arthritis Mother    Leukemia Maternal Grandmother    Stroke Maternal Grandmother    Cancer Maternal Grandfather        lung   Heart disease Paternal Grandmother    COPD Paternal Grandfather    Hypertension Maternal Uncle    Diabetes Maternal Uncle      Current Outpatient Medications:    amphetamine -dextroamphetamine  (ADDERALL  XR) 30 MG 24 hr  capsule, Take 30 mg by mouth daily., Disp: , Rfl:    buPROPion  (WELLBUTRIN  XL) 150 MG 24 hr tablet, TAKE 1 TABLET BY MOUTH EVERY DAY IN THE MORNING, Disp: 90 tablet, Rfl: 1   cyclobenzaprine  (FLEXERIL ) 10 MG tablet, TAKE 1 TABLET BY MOUTH EVERY DAY AS NEEDED, Disp: 30 tablet, Rfl: 1   DULoxetine  (CYMBALTA ) 60 MG capsule, Take 1 capsule (60 mg total) by mouth daily., Disp: 90 capsule, Rfl: 4   eletriptan  (RELPAX ) 40 MG tablet, Take 1 tablet (40 mg total) by mouth as needed for migraine or headache. May repeat in 2 hours if headache persists or recurs., Disp: 10 tablet, Rfl: 6   ELIQUIS  5 MG TABS tablet, TAKE 1 TABLET BY MOUTH TWICE A  DAY, Disp: 60 tablet, Rfl: 2   Ferrous Sulfate  Dried 143 (45 Fe) MG TBCR, Take 45 mg of iron  by mouth., Disp: , Rfl:    gabapentin  (NEURONTIN ) 400 MG capsule, Take 400 mg by mouth 3 (three) times daily., Disp: , Rfl:    Galcanezumab -gnlm (EMGALITY ) 120 MG/ML SOAJ, Inject 1 Pen into the skin every 30 (thirty) days., Disp: 1.12 mL, Rfl: 6   HYDROcodone -acetaminophen  (NORCO/VICODIN) 5-325 MG tablet, Take 1 tablet by mouth every 6 (six) hours as needed for moderate pain (pain score 4-6)., Disp: 20 tablet, Rfl: 0   Melatonin 10 MG CAPS, Take 10 mg by mouth at bedtime., Disp: , Rfl:    Multiple Vitamin (MULTIVITAMIN) tablet, Take 1 tablet by mouth at bedtime., Disp: , Rfl:    Probiotic Product (FORTIFY PROBIOTIC WOMENS PO), Take 1 capsule by mouth at bedtime., Disp: , Rfl:    Rimegepant Sulfate (NURTEC) 75 MG TBDP, Take 1 tablet (75 mg total) by mouth daily as needed (migraine)., Disp: 8 tablet, Rfl: 6   VITAMIN D  PO, Take 1 capsule by mouth at bedtime., Disp: , Rfl:    temazepam  (RESTORIL ) 15 MG capsule, TAKE 1 CAPSULE BY MOUTH AT BEDTIME AS NEEDED FOR SLEEP., Disp: 60 capsule, Rfl: 1   Allergies  Allergen Reactions   Novocain [Procaine] Anaphylaxis   Nsaids Other (See Comments)    H/o gastric bypass = avoid NSAIDs   Povidone Iodine Rash    Rash     Review of Systems  Constitutional: Negative.   Respiratory: Negative.    Cardiovascular: Negative.   Gastrointestinal: Negative.   Neurological:  Positive for headaches.  Psychiatric/Behavioral: Negative.       Today's Vitals   07/07/24 1409  BP: 126/80  Pulse: 85  Temp: 98.3 F (36.8 C)  SpO2: 98%  Weight: 194 lb 11.2 oz (88.3 kg)  Height: 5' (1.524 m)   Body mass index is 38.02 kg/m.  Wt Readings from Last 3 Encounters:  07/07/24 194 lb 11.2 oz (88.3 kg)  06/02/24 195 lb 12.8 oz (88.8 kg)  06/02/24 196 lb 9.6 oz (89.2 kg)     Objective:  Physical Exam Vitals and nursing note reviewed.  Constitutional:      Appearance:  Normal appearance. She is obese.  HENT:     Head: Normocephalic and atraumatic.  Eyes:     Extraocular Movements: Extraocular movements intact.  Cardiovascular:     Rate and Rhythm: Normal rate and regular rhythm.     Heart sounds: Normal heart sounds.  Pulmonary:     Effort: Pulmonary effort is normal.     Breath sounds: Normal breath sounds.  Musculoskeletal:     Cervical back: Normal range of motion.  Skin:    General:  Skin is warm.  Neurological:     General: No focal deficit present.     Mental Status: She is alert.  Psychiatric:        Mood and Affect: Mood normal.        Behavior: Behavior normal.         Assessment And Plan:  Attention deficit hyperactivity disorder (ADHD), unspecified ADHD type Assessment & Plan: Chronic, stable on current medication regimen. No change needed. [facility] ADD specialist appointment by two months if possible. - Refill medication as needed. - Schedule follow-up in four months.   Elevated alkaline phosphatase level Assessment & Plan: Chronic, likely related to hepatic steatosis.   Chronic migraine without aura without status migrainosus, not intractable Assessment & Plan: Migraines with tension headache component. Fioricet  effective but concerns about dependency. Emgality , Nurtec, and Relpax  reduce frequency. Botox suggested. - Consider Botox for prevention. - Continue Emgality . - Use Relpax  for acute attacks, Fioricet  as backup. - Use Flexeril  for tension headaches as needed.   Pancreatic lesion Assessment & Plan: Stable pancreatic cyst. Repeat imaging in one year. - Repeat imaging in one year. - MR abdomen due May 2026   Thyroid  nodule Assessment & Plan: Stable thyroid  nodule for over two years. Follow-up ultrasound due next year. Monitors neck changes. - Order thyroid  ultrasound now. - Repeat ultrasound next year. - Measure neck circumference.  Orders: -     US  THYROID ; Future -     TSH + free T4  Other iron   deficiency anemia Assessment & Plan: Completed iron  infusions. Hematologist to monitor iron  levels next month. - Follow up with hematologist for blood work and assessment.   Other orders -     Temazepam ; TAKE 1 CAPSULE BY MOUTH AT BEDTIME AS NEEDED FOR SLEEP.  Dispense: 60 capsule; Refill: 1  Return for 4 month ADD f/u.SABRA  Patient was given opportunity to ask questions. Patient verbalized understanding of the plan and was able to repeat key elements of the plan. All questions were answered to their satisfaction.   I, [name], MD, have reviewed all documentation for this visit. The documentation on 07/07/24 for the exam, diagnosis, procedures, and orders are all accurate and complete.   IF YOU HAVE BEEN REFERRED TO A SPECIALIST, IT MAY TAKE 1-2 WEEKS TO SCHEDULE/PROCESS THE REFERRAL. IF YOU HAVE NOT HEARD FROM US /SPECIALIST IN TWO WEEKS, PLEASE GIVE US  A CALL AT [phone].   THE PATIENT IS ENCOURAGED TO PRACTICE SOCIAL DISTANCING DUE TO THE COVID-19 PANDEMIC.

## 2024-07-07 NOTE — Patient Instructions (Signed)
 Living With Attention Deficit Hyperactivity Disorder If you have been diagnosed with attention deficit hyperactivity disorder (ADHD), you may be relieved that you now know why you have felt or behaved a certain way. Still, you may feel overwhelmed about the treatment ahead. You may also wonder how to get the support you need and how to deal with the condition day-to-day. With treatment and support, you can live with ADHD and manage your symptoms. How to manage lifestyle changes Managing lifestyle changes can be challenging. Seeking support from your healthcare provider, therapist, family, and friends can be helpful. How to recognize changes in your condition The following signs may mean that your treatment is working well and your condition is improving: Consistently being on time for appointments. Being more organized at home and work. Other people noticing improvements in your behavior. Achieving goals that you set for yourself. Thinking more clearly. The following signs may mean that your treatment is not working very well: Feeling impatience or more confusion. Missing, forgetting, or being late for appointments. An increasing sense of disorganization and messiness. More difficulty in reaching goals that you set for yourself. Loved ones becoming angry or frustrated with you. Follow these instructions at home: Medicines Take over-the-counter and prescription medicines only as told by your health care provider. Check with your health care provider before taking any new medicines. General instructions Create structure and an organized atmosphere at home. For example: Make a list of tasks, then rank them from most important to least important. Work on one task at a time until your listed tasks are done. Make a daily schedule and follow it consistently every day. Use an appointment calendar, and check it 2-3 times a day to keep on track. Keep it with you when you leave the house. Create  spaces where you keep certain things, and always put things back in their places after you use them. Keep all follow-up visits. Your health care provider will need to monitor your condition and adjust your treatment over time. Where to find support Talking to others  Keep emotion out of important discussions and speak in a calm, logical way. Listen closely and patiently to your loved ones. Try to understand their point of view, and try to avoid getting defensive. Take responsibility for the consequences of your actions. Ask that others do not take your behaviors personally. Aim to solve problems as they come up, and express your feelings instead of bottling them up. Talk openly about what you need from your loved ones and how they can support you. Consider going to family therapy sessions or having your family meet with a specialist who deals with ADHD-related behavior problems. Finances Not all insurance plans cover mental health care, so it is important to check with your insurance carrier. If paying for co-pays or counseling services is a problem, search for a local or county mental health care center. Public mental health care services may be offered there at a low cost or no cost when you are not able to see a private health care provider. If you are taking medicine for ADHD, you may be able to get the generic form, which may be less expensive than brand-name medicine. Some makers of prescription medicines also offer help to patients who cannot afford the medicines that they need. Therapy and support groups Talking with a mental health care provider and participating in support groups can help to improve your quality of life, daily functioning, and overall symptoms. Questions to ask your health  care provider: What are the risks and benefits of taking medicines? Would I benefit from therapy? How often should I follow up with a health care provider? Where to find more information Learn more  about ADHD from: Children and Adults with Attention Deficit Hyperactivity Disorder: [employer]add[employer] of Mental Health: BloggerCourse.com Centers for Disease Control and Prevention: TonerPromos.no Contact a health care provider if: You have side effects from your medicines, such as: Repeated muscle twitches, coughing, or speech outbursts. Sleep problems. Loss of appetite. Dizziness. Unusually fast heartbeat. Stomach pains. Headaches. You have new or worsening behavior problems. You are struggling with anxiety, depression, or substance abuse. Get help right away if: You have a severe reaction to a medicine. These symptoms may be an emergency. Get help right away. Call 911. Do not wait to see if the symptoms will go away. Do not drive yourself to the hospital. Take one of these steps if you feel like you may hurt yourself or others, or have thoughts about taking your own life: Go to your nearest emergency room. Call 911. Call the National Suicide Prevention Lifeline at (989) 115-0802 or 988. This is open 24 hours a day. Text the Crisis Text Line at 802-008-3988. Summary With treatment and support, you can live with ADHD and manage your symptoms. Consider taking part in family therapy or self-help groups with family members or friends. When you talk with friends and family about your ADHD, be patient and communicate openly. Keep all follow-up visits. Your health care provider will need to monitor your condition and adjust your treatment over time. This information is not intended to replace advice given to you by your health care provider. Make sure you discuss any questions you have with your health care provider. Document Revised: 03/24/2022 Document Reviewed: 03/24/2022 Elsevier Patient Education  2024 [employer].

## 2024-07-08 LAB — TSH+FREE T4
Free T4: 0.87 ng/dL (ref 0.82–1.77)
TSH: 1.01 u[IU]/mL (ref 0.450–4.500)

## 2024-07-09 ENCOUNTER — Ambulatory Visit: Payer: Self-pay | Admitting: Internal Medicine

## 2024-07-13 NOTE — Assessment & Plan Note (Signed)
 Stable thyroid  nodule for over two years. Follow-up ultrasound due next year. Monitors neck changes. - Order thyroid  ultrasound now. - Repeat ultrasound next year. - Measure neck circumference.

## 2024-07-13 NOTE — Assessment & Plan Note (Signed)
 Migraines with tension headache component. Fioricet  effective but concerns about dependency. Emgality , Nurtec, and Relpax  reduce frequency. Botox suggested. - Consider Botox for prevention. - Continue Emgality . - Use Relpax  for acute attacks, Fioricet  as backup. - Use Flexeril  for tension headaches as needed.

## 2024-07-13 NOTE — Assessment & Plan Note (Addendum)
 Stable pancreatic cyst. Repeat imaging in one year. - Repeat imaging in one year. - MR abdomen due May 2026

## 2024-07-13 NOTE — Assessment & Plan Note (Signed)
 Chronic, likely related to hepatic steatosis.

## 2024-07-13 NOTE — Assessment & Plan Note (Signed)
 Completed iron  infusions. Hematologist to monitor iron  levels next month. - Follow up with hematologist for blood work and assessment.

## 2024-07-13 NOTE — Assessment & Plan Note (Signed)
 Chronic, stable on current medication regimen. No change needed. [facility] ADD specialist appointment by two months if possible. - Refill medication as needed. - Schedule follow-up in four months.

## 2024-07-15 ENCOUNTER — Other Ambulatory Visit (HOSPITAL_COMMUNITY): Payer: Self-pay

## 2024-07-15 ENCOUNTER — Ambulatory Visit (HOSPITAL_BASED_OUTPATIENT_CLINIC_OR_DEPARTMENT_OTHER)
Admission: RE | Admit: 2024-07-15 | Discharge: 2024-07-15 | Disposition: A | Discharge status: Home / Self Care | Source: Ambulatory Visit | Attending: Internal Medicine | Admitting: Internal Medicine

## 2024-07-15 DIAGNOSIS — E041 Nontoxic single thyroid nodule: Secondary | ICD-10-CM | POA: Diagnosis present

## 2024-07-21 NOTE — Progress Notes (Signed)
 43 y.o. [id number] female with hx of PE (2024, on eliquis ), IDA (following with H/O)here for annual exam. Married, vasectomy. 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  Patient's last menstrual period was 06/29/2024 (exact date). 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 Seeing hematology for IV Fe. She had an acute PE diagnosed December 2024.  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 Last Hgb 11.4  Urine sample provided: No  Abnormal bleeding: as noted Pelvic discharge or pain: none Breast mass, nipple discharge or skin changes : none  Sexually active: Yes Birth control: vasectomy H/o abnl PAP: yes Last PAP: No results found for: DIAGPAP, HPVHIGH, ADEQPAP  Last mammogram: 05/01/24 Bi-Rads 1, B Last colonoscopy: 05/28/24 5 year recall  Exercising: Yes, works with a trainer 2 x week, cardio and strength training.  Smoker: No  Flowsheet Row Office Visit from 07/22/2024 in [facility]  PHQ-2 Total Score 0    Flowsheet Row Office Visit from 07/07/2024 in [facility]  PHQ-9 Total Score 0     GYN HISTORY: No sig hx  OB History  Gravida Para Term Preterm AB Living  3 2 2  1 2   SAB IAB Ectopic Multiple Live Births  1   0 2    # Outcome Date GA Lbr Len/2nd Weight Sex Type Anes PTL Lv  3 Term 12/06/20 [redacted]w[redacted]d  7 lb 3.7 oz (3.28 kg) F Vag-Spont EPI  LIV  2 Term 10/17/17 [redacted]w[redacted]d 06:54 / 01:08 6 lb 15.1 oz (3.15 kg) M Vag-Spont EPI, Local  LIV     Birth Comments: WNL   1 SAB 2015           Past Medical History:  Diagnosis Date   Abnormal Pap smear    CIN I   Anemia    Anxiety    Depression    off meds now, ok now   Enlarged thyroid  gland    nromal  function   GERD (gastroesophageal reflux disease)    Migraine    Obesity    Ovarian cyst    Vaginal Pap smear, abnormal    ok now   Past Surgical History:  Procedure Laterality Date   ESOPHAGOGASTRODUODENOSCOPY (EGD) WITH PROPOFOL  N/A 01/24/2024   Procedure: ESOPHAGOGASTRODUODENOSCOPY (EGD) WITH PROPOFOL ;  Surgeon: Suzann Inocente HERO, MD;  Location: [facility] ENDOSCOPY;  Service: Gastroenterology;  Laterality: N/A;   GASTRIC BYPASS  08/14/2022   Revision of sleeve/hiatal hernia repair   LAPAROSCOPIC GASTRIC SLEEVE RESECTION  2017   LAPAROSCOPY N/A 12/05/2023   Procedure: LAPAROSCOPY DIAGNOSTIC, CHOLECYSTECOMY, INTERNAL HERNIA REPAIR;  Surgeon: Dasie Leonor CROME, MD;  Location: [facility] ORS;  Service: General;  Laterality: N/A;   Current Outpatient Medications on File Prior to Visit  Medication Sig Dispense Refill   amphetamine -dextroamphetamine  (ADDERALL  XR) 30 MG 24 hr capsule Take 30 mg by mouth daily.     buPROPion  (WELLBUTRIN  XL) 150 MG 24 hr tablet TAKE 1 TABLET BY MOUTH EVERY DAY IN THE MORNING 90 tablet 1   cyclobenzaprine  (FLEXERIL ) 10 MG tablet TAKE 1 TABLET BY MOUTH EVERY DAY AS NEEDED 30 tablet 1   DULoxetine  (CYMBALTA ) 60 MG capsule Take 1 capsule (60 mg total) by mouth daily. 90 capsule 4   eletriptan  (RELPAX ) 40 MG tablet Take  1 tablet (40 mg total) by mouth as needed for migraine or headache. May repeat in 2 hours if headache persists or recurs. 10 tablet 6   ELIQUIS  5 MG TABS tablet TAKE 1 TABLET BY MOUTH TWICE A DAY 60 tablet 2   Ferrous Sulfate  Dried 143 (45 Fe) MG TBCR Take 45 mg of iron  by mouth.     gabapentin  (NEURONTIN ) 400 MG capsule Take 400 mg by mouth 3 (three) times daily.     Galcanezumab -gnlm (EMGALITY ) 120 MG/ML SOAJ Inject 1 Pen into the skin every 30 (thirty) days. 1.12 mL 6   HYDROcodone -acetaminophen  (NORCO/VICODIN) 5-325 MG tablet Take 1 tablet by mouth every 6 (six) hours as needed for moderate pain (pain score 4-6). 20 tablet 0   Melatonin 10 MG CAPS Take 10 mg by  mouth at bedtime.     Multiple Vitamin (MULTIVITAMIN) tablet Take 1 tablet by mouth at bedtime.     Probiotic Product (FORTIFY PROBIOTIC WOMENS PO) Take 1 capsule by mouth at bedtime.     temazepam  (RESTORIL ) 15 MG capsule TAKE 1 CAPSULE BY MOUTH AT BEDTIME AS NEEDED FOR SLEEP. 60 capsule 1   VITAMIN D  PO Take 1 capsule by mouth at bedtime.     No current facility-administered medications on file prior to visit.   Social History   Socioeconomic History   Marital status: Married    Spouse name: n/a   Number of children: 0   Years of education: master's   Highest education level: Master's degree (e.g., MA, MS, MEng, MEd, MSW, MBA)  Occupational History   Occupation: RN    Comment: [employer]   Occupation: NP    Comment: graduated/boards spring 2014  Tobacco Use   Smoking status: Former    Current packs/day: 0.00    Types: Cigarettes    Start date: 06/07/2009    Quit date: 06/07/2013    Years since quitting: 11.1   Smokeless tobacco: Never   Tobacco comments:    only smoked on the weekends  Vaping Use   Vaping status: Never Used  Substance and Sexual Activity   Alcohol use: Not Currently    Comment: SOCIAL    Drug use: No   Sexual activity: Yes  Other Topics Concern   Not on file  Social History Narrative   Not on file   Social Drivers of Health   Financial Resource Strain: Low Risk  (07/07/2024)   Overall Financial Resource Strain (CARDIA)    Difficulty of Paying Living Expenses: Not hard at all  Food Insecurity: No Food Insecurity (07/07/2024)   Hunger Vital Sign    Worried About Running Out of Food in the Last Year: Never true    Ran Out of Food in the Last Year: Never true  Transportation Needs: No Transportation Needs (07/07/2024)   PRAPARE - Administrator, Civil Service (Medical): No    Lack of Transportation (Non-Medical): No  Physical Activity: Insufficiently Active (07/07/2024)   Exercise Vital Sign    Days of Exercise per Week: 3 days     Minutes of Exercise per Session: 40 min  Stress: No Stress Concern Present (07/07/2024)   [employer] of Occupational Health - Occupational Stress Questionnaire    Feeling of Stress: Only a little  Social Connections: Socially Integrated (07/07/2024)   Social Connection and Isolation Panel    Frequency of Communication with Friends and Family: More than three times a week    Frequency of Social Gatherings with Friends  and Family: Once a week    Attends Religious Services: More than 4 times per year    Active Member of Clubs or Organizations: Yes    Attends Banker Meetings: More than 4 times per year    Marital Status: Married  Catering manager Violence: Not At Risk (01/22/2024)   Humiliation, Afraid, Rape, and Kick questionnaire    Fear of Current or Ex-Partner: No    Emotionally Abused: No    Physically Abused: No    Sexually Abused: No   Family History  Problem Relation Age of Onset   Thyroid  disease Mother        s/p thyroidectomy   Arthritis Mother    Leukemia Maternal Grandmother    Stroke Maternal Grandmother    Cancer Maternal Grandfather        lung   Heart disease Paternal Grandmother    COPD Paternal Grandfather    Hypertension Maternal Uncle    Diabetes Maternal Uncle    Allergies  Allergen Reactions   Novocain [Procaine] Anaphylaxis   Nsaids Other (See Comments)    H/o gastric bypass = avoid NSAIDs   Povidone Iodine Rash    Rash     PE Today's Vitals   07/22/24 1109  BP: 118/78  Pulse: 91  Temp: 97.8 F (36.6 C)  TempSrc: Oral  SpO2: 99%  Weight: 197 lb (89.4 kg)  Height: 5' 1 (1.549 m)   Body mass index is 37.22 kg/m.  Physical Exam Vitals reviewed. Exam conducted with a chaperone present.  Constitutional:      General: She is not in acute distress.    Appearance: Normal appearance.  HENT:     Head: Normocephalic and atraumatic.     Nose: Nose normal.  Eyes:     Extraocular Movements: Extraocular movements intact.      Conjunctiva/sclera: Conjunctivae normal.  Neck:     Thyroid : Thyromegaly present. No thyroid  mass or thyroid  tenderness.  Pulmonary:     Effort: Pulmonary effort is normal.  Chest:     Chest wall: No mass or tenderness.  Breasts:    Right: Normal. No swelling, mass, nipple discharge, skin change or tenderness.     Left: Normal. No swelling, mass, nipple discharge, skin change or tenderness.  Abdominal:     General: There is no distension.     Palpations: Abdomen is soft.     Tenderness: There is no abdominal tenderness.  Genitourinary:    General: Normal vulva.     Exam position: Lithotomy position.     Urethra: No prolapse.     Vagina: Normal. No vaginal discharge or bleeding.     Cervix: Normal. No lesion.     Uterus: Normal. Not enlarged and not tender.      Adnexa: Right adnexa normal and left adnexa normal.  Musculoskeletal:        General: Normal range of motion.     Cervical back: Normal range of motion.  Lymphadenopathy:     Upper Body:     Right upper body: No axillary adenopathy.     Left upper body: No axillary adenopathy.     Lower Body: No right inguinal adenopathy. No left inguinal adenopathy.  Skin:    General: Skin is warm and dry.  Neurological:     General: No focal deficit present.     Mental Status: She is alert.  Psychiatric:        Mood and Affect: Mood normal.  Behavior: Behavior normal.       Assessment and Plan:        Well woman exam with routine gynecological exam Assessment & Plan: Cervical cancer screening performed according to ASCCP guidelines. Encouraged annual mammogram screening Colonoscopy UTD  Labs and immunizations with her primary Encouraged safe sexual practices as indicated Encouraged healthy lifestyle practices with diet and exercise For patients under 50yo, I recommend 1000mg  calcium  daily and 600IU of vitamin D  daily.    Cervical cancer screening -     Cytology - PAP  Negative depression screening  Abnormal  uterine bleeding (AUB) Assessment & Plan: AUB-C Likely due to Eliquis , planning for 2 years of treatment Discussed management options of AUB during this time Including Mirena versus endometrial ablation. Could also consider hysterectomy however currently on therapeutic Eliquis  which increases bleeding risk. Plan for endometrial elation, pamphlets provided. Return office for EMB  Orders: -     Endometrial biopsy; Future   Vera LULLA Pa, MD

## 2024-07-22 ENCOUNTER — Ambulatory Visit (INDEPENDENT_AMBULATORY_CARE_PROVIDER_SITE_OTHER): Admitting: Obstetrics and Gynecology

## 2024-07-22 ENCOUNTER — Encounter: Payer: Self-pay | Admitting: Obstetrics and Gynecology

## 2024-07-22 ENCOUNTER — Other Ambulatory Visit (HOSPITAL_COMMUNITY)
Admission: RE | Admit: 2024-07-22 | Discharge: 2024-07-22 | Disposition: A | Discharge status: Home / Self Care | Source: Ambulatory Visit | Attending: Obstetrics and Gynecology | Admitting: Obstetrics and Gynecology

## 2024-07-22 VITALS — BP 118/78 | HR 91 | Temp 97.8°F | Ht 61.0 in | Wt 197.0 lb

## 2024-07-22 DIAGNOSIS — Z01419 Encounter for gynecological examination (general) (routine) without abnormal findings: Secondary | ICD-10-CM | POA: Diagnosis not present

## 2024-07-22 DIAGNOSIS — Z1331 Encounter for screening for depression: Secondary | ICD-10-CM

## 2024-07-22 DIAGNOSIS — Z124 Encounter for screening for malignant neoplasm of cervix: Secondary | ICD-10-CM | POA: Insufficient documentation

## 2024-07-22 DIAGNOSIS — N939 Abnormal uterine and vaginal bleeding, unspecified: Secondary | ICD-10-CM | POA: Insufficient documentation

## 2024-07-22 NOTE — Assessment & Plan Note (Signed)
 AUB-C Likely due to Eliquis , planning for 2 years of treatment Discussed management options of AUB during this time Including Mirena versus endometrial ablation. Could also consider hysterectomy however currently on therapeutic Eliquis  which increases bleeding risk. Plan for endometrial elation, pamphlets provided. Return office for EMB

## 2024-07-22 NOTE — Patient Instructions (Signed)

## 2024-07-22 NOTE — Assessment & Plan Note (Signed)
Cervical cancer screening performed according to ASCCP guidelines. Encouraged annual mammogram screening Colonoscopy UTD Labs and immunizations with her primary Encouraged safe sexual practices as indicated Encouraged healthy lifestyle practices with diet and exercise For patients under 42yo, I recommend 1000mg  calcium daily and 600IU of vitamin D daily.

## 2024-07-28 ENCOUNTER — Ambulatory Visit: Payer: Self-pay | Admitting: Obstetrics and Gynecology

## 2024-07-28 LAB — CYTOLOGY - PAP
Comment: NEGATIVE
Diagnosis: NEGATIVE
High risk HPV: NEGATIVE

## 2024-08-05 ENCOUNTER — Encounter: Payer: Self-pay | Admitting: Internal Medicine

## 2024-08-05 MED ORDER — AMPHETAMINE-DEXTROAMPHET ER 30 MG PO CP24: 30.0000 mg | ORAL_CAPSULE | Freq: Every day | ORAL | 0 refills | Status: DC

## 2024-08-17 ENCOUNTER — Other Ambulatory Visit: Payer: Self-pay | Admitting: Internal Medicine

## 2024-09-02 ENCOUNTER — Ambulatory Visit: Payer: Self-pay

## 2024-09-02 ENCOUNTER — Other Ambulatory Visit

## 2024-09-02 ENCOUNTER — Other Ambulatory Visit: Payer: Self-pay | Admitting: Family

## 2024-09-02 ENCOUNTER — Ambulatory Visit: Admitting: Family

## 2024-09-02 ENCOUNTER — Inpatient Hospital Stay: Attending: Hematology & Oncology

## 2024-09-02 ENCOUNTER — Inpatient Hospital Stay: Admitting: Family

## 2024-09-02 VITALS — BP 136/82 | HR 95 | Temp 98.6°F | Resp 17 | Ht 60.0 in | Wt 198.0 lb

## 2024-09-02 DIAGNOSIS — Z86711 Personal history of pulmonary embolism: Secondary | ICD-10-CM | POA: Diagnosis not present

## 2024-09-02 DIAGNOSIS — D509 Iron deficiency anemia, unspecified: Secondary | ICD-10-CM

## 2024-09-02 DIAGNOSIS — Z7901 Long term (current) use of anticoagulants: Secondary | ICD-10-CM | POA: Insufficient documentation

## 2024-09-02 DIAGNOSIS — I2699 Other pulmonary embolism without acute cor pulmonale: Secondary | ICD-10-CM | POA: Diagnosis not present

## 2024-09-02 LAB — CBC WITH DIFFERENTIAL (CANCER CENTER ONLY)
Abs Immature Granulocytes: 0.01 K/uL (ref 0.00–0.07)
Basophils Absolute: 0 K/uL (ref 0.0–0.1)
Basophils Relative: 1 %
Eosinophils Absolute: 0.1 K/uL (ref 0.0–0.5)
Eosinophils Relative: 2 %
HCT: 39.1 % (ref 36.0–46.0)
Hemoglobin: 12.3 g/dL (ref 12.0–15.0)
Immature Granulocytes: 0 %
Lymphocytes Relative: 42 %
Lymphs Abs: 2.1 K/uL (ref 0.7–4.0)
MCH: 29 pg (ref 26.0–34.0)
MCHC: 31.5 g/dL (ref 30.0–36.0)
MCV: 92.2 fL (ref 80.0–100.0)
Monocytes Absolute: 0.3 K/uL (ref 0.1–1.0)
Monocytes Relative: 5 %
Neutro Abs: 2.4 K/uL (ref 1.7–7.7)
Neutrophils Relative %: 50 %
Platelet Count: 331 K/uL (ref 150–400)
RBC: 4.24 MIL/uL (ref 3.87–5.11)
RDW: 13.2 % (ref 11.5–15.5)
WBC Count: 4.9 K/uL (ref 4.0–10.5)
nRBC: 0 % (ref 0.0–0.2)

## 2024-09-02 LAB — CMP (CANCER CENTER ONLY)
ALT: 31 U/L (ref 0–44)
AST: 38 U/L (ref 15–41)
Albumin: 4.4 g/dL (ref 3.5–5.0)
Alkaline Phosphatase: 211 U/L — ABNORMAL HIGH (ref 38–126)
Anion gap: 10 (ref 5–15)
BUN: 9 mg/dL (ref 6–20)
CO2: 26 mmol/L (ref 22–32)
Calcium: 9.5 mg/dL (ref 8.9–10.3)
Chloride: 102 mmol/L (ref 98–111)
Creatinine: 0.7 mg/dL (ref 0.44–1.00)
GFR, Estimated: >60 mL/min (ref >60)
Glucose, Bld: 115 mg/dL — ABNORMAL HIGH (ref 70–99)
Potassium: 4.6 mmol/L (ref 3.5–5.1)
Sodium: 137 mmol/L (ref 135–145)
Total Bilirubin: 0.2 mg/dL (ref 0.0–1.2)
Total Protein: 7.2 g/dL (ref 6.5–8.1)

## 2024-09-02 LAB — ANTITHROMBIN III: AntiThromb III Func: 138 % — ABNORMAL HIGH (ref 75–120)

## 2024-09-02 LAB — IRON AND IRON BINDING CAPACITY (CC-WL,HP ONLY)
Iron: 76 ug/dL (ref 28–170)
Saturation Ratios: 21 % (ref 10.4–31.8)
TIBC: 357 ug/dL (ref 250–450)
UIBC: 281 ug/dL

## 2024-09-02 LAB — FERRITIN: Ferritin: 44 ng/mL (ref 11–307)

## 2024-09-02 NOTE — Progress Notes (Signed)
 Hematology and Oncology Follow Up Visit  Jessica Banks [id number] 02-28-1981 43 y.o. 09/02/2024   Principle Diagnosis:  Pulmonary embolism - resolved on CTA 05/2024 Iron  deficiency anemia   Current Therapy:   Eliquis  5 mg PO BID IV iron  as indicated    Interim History:  Jessica Banks is here today for follow-up. She is doing fairly well but does note persistent fatigue. She has been very busy with her job.  She also notes that her cycle on Eliquis  has been quite heavy with large clots. She states that gynecology plans to do an ablation to help stop her cycle. She was instructed to hold her Eliquis  starting two days prior to the procedure and then restart the day after. She verbalized understanding.  No other blood loss noted. No bruising or petechiae.  She has also had issues with hypoglycemia. She states that her work up with PCP and endocrinology has been negative so far.  No fever, chills, n/v, cough, rash, dizziness, SOB, chest pain, palpitations, abdominal pain or changes in bowel or bladder habits.  No swelling, tenderness, numbness or tingling in her extremities.  No falls or syncope reported.  Appetite and hydration are good. Weight is stable at 198 lbs.   ECOG Performance Status: 1 - Symptomatic but completely ambulatory  Medications:  Allergies as of 09/02/2024       Reactions   Novocain [procaine] Anaphylaxis   Nsaids Other (See Comments)   H/o gastric bypass = avoid NSAIDs   Povidone Iodine Rash   Rash        Medication List        Accurate as of September 02, 2024 10:50 AM. If you have any questions, ask your nurse or doctor.          amphetamine -dextroamphetamine  30 MG 24 hr capsule Commonly known as: ADDERALL  XR Take 1 capsule (30 mg total) by mouth daily.   buPROPion  150 MG 24 hr tablet Commonly known as: WELLBUTRIN  XL TAKE 1 TABLET BY MOUTH EVERY DAY IN THE MORNING   cyclobenzaprine  10 MG tablet Commonly known as: FLEXERIL  TAKE 1 TABLET  BY MOUTH EVERY DAY AS NEEDED   DULoxetine  60 MG capsule Commonly known as: Cymbalta  Take 1 capsule (60 mg total) by mouth daily.   eletriptan  40 MG tablet Commonly known as: RELPAX  Take 1 tablet (40 mg total) by mouth as needed for migraine or headache. May repeat in 2 hours if headache persists or recurs.   Eliquis  5 MG Tabs tablet Generic drug: apixaban  TAKE 1 TABLET BY MOUTH TWICE A DAY   Emgality  120 MG/ML Soaj Generic drug: Galcanezumab -gnlm Inject 1 Pen into the skin every 30 (thirty) days.   Ferrous Sulfate  Dried 143 (45 Fe) MG Tbcr Take 45 mg of iron  by mouth.   FORTIFY PROBIOTIC WOMENS PO Take 1 capsule by mouth at bedtime.   gabapentin  400 MG capsule Commonly known as: NEURONTIN  Take 400 mg by mouth 3 (three) times daily.   HYDROcodone -acetaminophen  5-325 MG tablet Commonly known as: NORCO/VICODIN Take 1 tablet by mouth every 6 (six) hours as needed for moderate pain (pain score 4-6).   Melatonin 10 MG Caps Take 10 mg by mouth at bedtime.   multivitamin tablet Take 1 tablet by mouth at bedtime.   temazepam  15 MG capsule Commonly known as: RESTORIL  TAKE 1 CAPSULE BY MOUTH AT BEDTIME AS NEEDED FOR SLEEP.   VITAMIN D  PO Take 1 capsule by mouth at bedtime.        Allergies:  Allergies  Allergen Reactions   Novocain [Procaine] Anaphylaxis   Nsaids Other (See Comments)    H/o gastric bypass = avoid NSAIDs   Povidone Iodine Rash    Rash    Past Medical History, Surgical history, Social history, and Family History were reviewed and updated.  Review of Systems: All other 10 point review of systems is negative.   Physical Exam:  height is 5' (1.524 m) and weight is 198 lb (89.8 kg). Her oral temperature is 98.6 F (37 C). Her blood pressure is 136/82 and her pulse is 95. Her respiration is 17 and oxygen saturation is 100%.   Wt Readings from Last 3 Encounters:  09/02/24 198 lb (89.8 kg)  07/22/24 197 lb (89.4 kg)  07/07/24 194 lb 11.2 oz (88.3  kg)    Ocular: Sclerae unicteric, pupils equal, round and reactive to light Ear-nose-throat: Oropharynx clear, dentition fair Lymphatic: No cervical or supraclavicular adenopathy Lungs no rales or rhonchi, good excursion bilaterally Heart regular rate and rhythm, no murmur appreciated Abd soft, nontender, positive bowel sounds MSK no focal spinal tenderness, no joint edema Neuro: non-focal, well-oriented, appropriate affect Breasts: Deferred   Lab Results  Component Value Date   WBC 4.9 09/02/2024   HGB 12.3 09/02/2024   HCT 39.1 09/02/2024   MCV 92.2 09/02/2024   PLT 331 09/02/2024   Lab Results  Component Value Date   FERRITIN 296 06/02/2024   IRON  322 (H) 06/02/2024   TIBC 363 06/02/2024   UIBC 41 06/02/2024   IRONPCTSAT 89 (H) 06/02/2024   Lab Results  Component Value Date   RETICCTPCT 1.5 06/02/2024   RBC 4.24 09/02/2024   No results found for: KPAFRELGTCHN, LAMBDASER, KAPLAMBRATIO No results found for: IGGSERUM, IGA, IGMSERUM No results found for: STEPHANY CARLOTA BENSON MARKEL EARLA JOANNIE DOC VICK, SPEI   Chemistry      Component Value Date/Time   NA 138 04/30/2024 1149   NA 140 04/02/2024 1026   K 4.0 04/30/2024 1149   CL 105 04/30/2024 1149   CO2 27 04/30/2024 1149   BUN 8 04/30/2024 1149   BUN 11 04/02/2024 1026   CREATININE 0.68 04/30/2024 1149   CREATININE 0.60 04/06/2016 1700   GLU 108 09/23/2023 0000      Component Value Date/Time   CALCIUM  9.0 04/30/2024 1149   ALKPHOS 185 (H) 04/30/2024 1149   AST 34 04/30/2024 1149   ALT 31 04/30/2024 1149   BILITOT 0.2 04/30/2024 1149   BILITOT <0.2 04/02/2024 1026       Impression and Plan: Ms. [name] is a very pleasant 43 yo caucasian female with IDA as well as PE involving multiple upper lobe branches of the right pulmonary artery without evidence of right heart strain post surgery (internal hernia repair and cholecystectomy).  She will continue her same  regimen with Eliquis .  Hyper coag panel was negative.  Follow-up in another 3 months.   Jessica Pepper, NP 9/16/202510:50 AM

## 2024-09-12 ENCOUNTER — Other Ambulatory Visit: Payer: Self-pay | Admitting: Internal Medicine

## 2024-09-12 ENCOUNTER — Encounter: Payer: Self-pay | Admitting: Diagnostic Neuroimaging

## 2024-09-12 ENCOUNTER — Encounter: Payer: Self-pay | Admitting: Internal Medicine

## 2024-09-12 MED ORDER — AMPHETAMINE-DEXTROAMPHET ER 30 MG PO CP24: 30.0000 mg | ORAL_CAPSULE | Freq: Every day | ORAL | 0 refills | Status: AC

## 2024-09-16 ENCOUNTER — Other Ambulatory Visit (HOSPITAL_COMMUNITY)
Admission: RE | Admit: 2024-09-16 | Discharge: 2024-09-16 | Disposition: A | Discharge status: Home / Self Care | Source: Ambulatory Visit | Attending: Obstetrics and Gynecology | Admitting: Obstetrics and Gynecology

## 2024-09-16 ENCOUNTER — Ambulatory Visit (INDEPENDENT_AMBULATORY_CARE_PROVIDER_SITE_OTHER): Admitting: Obstetrics and Gynecology

## 2024-09-16 ENCOUNTER — Encounter: Payer: Self-pay | Admitting: Obstetrics and Gynecology

## 2024-09-16 VITALS — BP 122/64 | HR 84 | Temp 98.9°F | Wt 197.0 lb

## 2024-09-16 DIAGNOSIS — Z86711 Personal history of pulmonary embolism: Secondary | ICD-10-CM | POA: Diagnosis not present

## 2024-09-16 DIAGNOSIS — Z9884 Bariatric surgery status: Secondary | ICD-10-CM | POA: Diagnosis not present

## 2024-09-16 DIAGNOSIS — Z01812 Encounter for preprocedural laboratory examination: Secondary | ICD-10-CM

## 2024-09-16 DIAGNOSIS — N939 Abnormal uterine and vaginal bleeding, unspecified: Secondary | ICD-10-CM

## 2024-09-16 LAB — PREGNANCY, URINE: Preg Test, Ur: NEGATIVE

## 2024-09-16 NOTE — Assessment & Plan Note (Signed)
 Plan for hysteroscopy, dilation and curettage, Novasure endometrial ablation, possible Mirena insertion  Discussed outpatient procedure. Reviewed that  recovery is usually 1-2 days. Risks including infections, bleeding, and damage to surrounding organs reviewed. Recommend NPO prior to midnight and reviewed medication to take on day of surgery. Dicussed use of NSAIDS as needed for pain postoperatively.  Preop checklist: Antibiotics: none DVT ppx: SCDs Postop visit: 1 week Additional clearance: hematology, hold eliquis  48hr before procedure

## 2024-09-16 NOTE — Patient Instructions (Signed)
 It is common to have vaginal bleeding and cramping for up to 72 hours after your biopsy. Please call our office with heavy vaginal bleeding, severe abdominal pain or fever. Avoid intercourse, tampon use, douching and baths for 7 days to decrease the risk of infection.

## 2024-09-16 NOTE — Progress Notes (Signed)
 43 y.o. [id number] female with AUB, hx of PE (2024, on eliquis ), IDA (following with H/O) here for EMB. Married, vasectomy. 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  Patient's last menstrual period was 08/26/2024 (exact date). Period Duration (Days): 5 Period Pattern: Regular Menstrual Flow: Heavy Menstrual Control: Maxi pad, Tampon Dysmenorrhea: (!) Mild Dysmenorrhea Symptoms: Headache  She is doing well. On 08/01/24, she reported HVB since starting eliquis . Seeing hematology for IV Fe.She had an acute PE diagnosed December 2024.   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 Birth control: vasectomy  Uterus measurement on 01/23/24 CT 9cm in length.  GYN HISTORY: No sig hx  OB History  Gravida Para Term Preterm AB Living  3 2 2  1 2   SAB IAB Ectopic Multiple Live Births  1   0 2    # Outcome Date GA Lbr Len/2nd Weight Sex Type Anes PTL Lv  3 Term 12/06/20 [redacted]w[redacted]d  7 lb 3.7 oz (3.28 kg) F Vag-Spont EPI  LIV  2 Term 10/17/17 [redacted]w[redacted]d 06:54 / 01:08 6 lb 15.1 oz (3.15 kg) M Vag-Spont EPI, Local  LIV     Birth Comments: WNL   1 SAB 2015           Past Medical History:  Diagnosis Date   Abnormal Pap smear    CIN I   Anemia    Anxiety    Depression    off meds now, ok now   Enlarged thyroid  gland    nromal function   GERD (gastroesophageal reflux disease)    Migraine    Obesity    Ovarian cyst    Vaginal Pap smear, abnormal    ok now   Past Surgical History:  Procedure Laterality Date   ESOPHAGOGASTRODUODENOSCOPY (EGD) WITH PROPOFOL  N/A 01/24/2024   Procedure: ESOPHAGOGASTRODUODENOSCOPY (EGD) WITH PROPOFOL ;  Surgeon: Suzann Inocente HERO, MD;  Location: [facility] ENDOSCOPY;  Service: Gastroenterology;  Laterality: N/A;   GASTRIC BYPASS  08/14/2022   Revision of sleeve/hiatal hernia repair   LAPAROSCOPIC GASTRIC SLEEVE RESECTION  2017   LAPAROSCOPY N/A 12/05/2023    Procedure: LAPAROSCOPY DIAGNOSTIC, CHOLECYSTECOMY, INTERNAL HERNIA REPAIR;  Surgeon: Dasie Leonor CROME, MD;  Location: [facility] ORS;  Service: General;  Laterality: N/A;   Current Outpatient Medications on File Prior to Visit  Medication Sig Dispense Refill   amphetamine -dextroamphetamine  (ADDERALL  XR) 30 MG 24 hr capsule Take 1 capsule (30 mg total) by mouth daily. 30 capsule 0   buPROPion  (WELLBUTRIN  XL) 150 MG 24 hr tablet TAKE 1 TABLET BY MOUTH EVERY DAY IN THE MORNING 90 tablet 1   cyclobenzaprine  (FLEXERIL ) 10 MG tablet TAKE 1 TABLET BY MOUTH EVERY DAY AS NEEDED 30 tablet 1   DULoxetine  (CYMBALTA ) 60 MG capsule Take 1 capsule (60 mg total) by mouth daily. 90 capsule 4   eletriptan  (RELPAX ) 40 MG tablet Take 1 tablet (40 mg total) by mouth as needed for migraine or headache. May repeat in 2 hours if headache persists or recurs. 10 tablet 6   ELIQUIS  5 MG TABS tablet TAKE 1 TABLET BY MOUTH TWICE A DAY 60 tablet 2   Ferrous Sulfate  Dried 143 (45 Fe) MG TBCR Take 45 mg of iron  by mouth.     gabapentin  (NEURONTIN ) 400 MG capsule Take 400 mg by mouth 3 (three) times daily.     Galcanezumab -gnlm (EMGALITY ) 120 MG/ML SOAJ Inject 1 Pen into the  skin every 30 (thirty) days. 1.12 mL 6   Melatonin 10 MG CAPS Take 10 mg by mouth at bedtime.     Multiple Vitamin (MULTIVITAMIN) tablet Take 1 tablet by mouth at bedtime.     Probiotic Product (FORTIFY PROBIOTIC WOMENS PO) Take 1 capsule by mouth at bedtime.     temazepam  (RESTORIL ) 15 MG capsule TAKE 1 CAPSULE BY MOUTH AT BEDTIME AS NEEDED FOR SLEEP. 60 capsule 1   VITAMIN D  PO Take 1 capsule by mouth at bedtime.     No current facility-administered medications on file prior to visit.   Allergies  Allergen Reactions   Novocain [Procaine] Anaphylaxis   Nsaids Other (See Comments)    H/o gastric bypass = avoid NSAIDs   Povidone Iodine Rash    Rash      PE Today's Vitals   09/16/24 1004  BP: 122/64  Pulse: 84  Temp: 98.9 F (37.2 C)  TempSrc: Oral   SpO2: 98%  Weight: 197 lb (89.4 kg)   Body mass index is 38.47 kg/m.  Physical Exam Vitals reviewed. Exam conducted with a chaperone present.  Constitutional:      General: She is not in acute distress.    Appearance: Normal appearance.  HENT:     Head: Normocephalic and atraumatic.     Nose: Nose normal.  Eyes:     Extraocular Movements: Extraocular movements intact.     Conjunctiva/sclera: Conjunctivae normal.  Cardiovascular:     Rate and Rhythm: Normal rate and regular rhythm.     Heart sounds: No murmur heard.    No friction rub. No gallop.  Pulmonary:     Effort: Pulmonary effort is normal. No respiratory distress.     Breath sounds: Normal breath sounds. No stridor. No wheezing, rhonchi or rales.  Genitourinary:    General: Normal vulva.     Exam position: Lithotomy position.     Vagina: Normal. No vaginal discharge.     Cervix: Normal. No cervical motion tenderness, discharge or lesion.     Uterus: Normal. Not enlarged and not tender.      Adnexa: Right adnexa normal and left adnexa normal.  Musculoskeletal:        General: Normal range of motion.     Cervical back: Normal range of motion.  Neurological:     General: No focal deficit present.     Mental Status: She is alert.  Psychiatric:        Mood and Affect: Mood normal.        Behavior: Behavior normal.     Procedure Consent was signed. Timeout was performed. Speculum inserted into the vagina, cervix visualized and was prepped with Betadine.  Cervical block was declined.. A single-toothed tenaculum was placed on the anterior lip of the cervix to stabilize it.  The 3 mm pipelle was introduced into the endometrial cavity without difficulty to a depth of 10cm, suction initiated and a moderate amount of tissue was obtained over 2 passes and sent to pathology.  The instruments were removed from the patient's vagina.  Minimal bleeding from the cervix was noted.  The patient tolerated the procedure well.      Assessment and Plan:        Pre-procedure lab exam -     Pregnancy, urine  Abnormal uterine bleeding (AUB) Assessment & Plan: Plan for hysteroscopy, dilation and curettage, Novasure endometrial ablation, possible Mirena insertion  Discussed outpatient procedure. Reviewed that  recovery is usually 1-2 days. Risks including infections,  bleeding, and damage to surrounding organs reviewed. Recommend NPO prior to midnight and reviewed medication to take on day of surgery. Dicussed use of NSAIDS as needed for pain postoperatively.  Preop checklist: Antibiotics: none DVT ppx: SCDs Postop visit: 1 week Additional clearance: hematology, hold eliquis  48hr before procedure    Orders: -     Surgical pathology -     Ambulatory Referral For Surgery Scheduling  History of pulmonary embolism  History of bariatric surgery    Vera LULLA Pa, MD

## 2024-09-17 ENCOUNTER — Ambulatory Visit: Payer: Self-pay | Admitting: Obstetrics and Gynecology

## 2024-09-17 LAB — SURGICAL PATHOLOGY

## 2024-09-18 ENCOUNTER — Encounter: Payer: Self-pay | Admitting: Diagnostic Neuroimaging

## 2024-09-18 ENCOUNTER — Telehealth: Payer: Self-pay | Admitting: Diagnostic Neuroimaging

## 2024-09-18 ENCOUNTER — Telehealth (INDEPENDENT_AMBULATORY_CARE_PROVIDER_SITE_OTHER): Payer: Self-pay | Admitting: Diagnostic Neuroimaging

## 2024-09-18 DIAGNOSIS — G43019 Migraine without aura, intractable, without status migrainosus: Secondary | ICD-10-CM

## 2024-09-18 MED ORDER — QULIPTA 60 MG PO TABS: 60.0000 mg | ORAL_TABLET | Freq: Every day | ORAL | 12 refills | Status: DC

## 2024-09-18 NOTE — Patient Instructions (Signed)
-   stop galazanezumab (Emgality ) 120mg  monthly - start quilpta 60mg  daily

## 2024-09-18 NOTE — Telephone Encounter (Signed)
 Re: today's my chart vv pt states she has been online since before 3:30. Pt is in the parking lot of her 4:20 eye dr appointment, she will only be able to wait until about 4:10 before she has to hang up for her New Pt eye dr appointment, message was forwarded to MD as well.

## 2024-09-18 NOTE — Progress Notes (Signed)
 [facility]  PATIENT: Jessica Banks DOB: [birth year]  REFERRING CLINICIAN: Jarold Medici, MD HISTORY FROM: patient  REASON FOR VISIT: follow up   HISTORICAL  CHIEF COMPLAINT:  Chief Complaint  Patient presents with   Headache    HISTORY OF PRESENT ILLNESS:   UPDATE (09/18/24, VRP): Since last visit, doing well with 1-2 HA per month. Then on 09/10/24, had worsening headaches, and now constant since then. Tried her usual rescue meds, but no relief.   UPDATE (05/19/24, VRP): Since last visit, doing well. Symptoms are improved. No alleviating or aggravating factors. Tolerating meds. Emgality  helping. Nurtec not helping. Still using fioricet  (~5-10 per month).   UPDATE (11/19/23, VRP): Since last visit, doing better with HA; now 3-4 HA per month on emgality . Tried nurtec with mild benefit. Uses fioricet  as back up rescue. Sometimes has visual aura.   PRIOR HPI (03/16/23, Dr. Rush): The patient presents for evaluation of headaches which began around age 63. She did have a normal MRI as a child. Had a period of time between adolescence and college where she did not have migraines. They then started to return after college. Headaches are associated with photophobia, phonophobia, and osmophobia. She currently averages 12 migraine days per month.   Previous took Nurtec which was helpful, but had to stop this during pregnancy and has not had a chance to restart it. Took Fioricet  during pregnancy which was helpful. Has also taken Flexeril  as needed for neck spasms. She has tried multiple preventives which she has failed due to lack of efficacy.   Headache History: Onset: age 75 Aura: no Associated Symptoms:             Photophobia: yes             Phonophobia: yes Worse with activity?: yes Duration of headaches: 2 days   Migraine days per month: 12 Headache free days per month: 18   Current Treatment: Abortive Fioricet  Flexeril    Preventative none   Prior  Therapies                                 Rescue: Toradol  Fioricet  Flexeril  Imitrex Maxalt 10 mg PRN Zomig spray Nurtec - helpful Ubrelvy - lack of efficacy   Preventive: Propranolol - lightheadedness Topamax - paresthesias Zonisamide Celexa Sertraline  Aimovig  - lack of efficacy Trigger point injections    REVIEW OF SYSTEMS: Full 14 system review of systems performed and negative with exception of: as per HPI/.  ALLERGIES: Allergies  Allergen Reactions   Novocain [Procaine] Anaphylaxis   Nsaids Other (See Comments)    H/o gastric bypass = avoid NSAIDs   Povidone Iodine Rash    Rash    HOME MEDICATIONS: Outpatient Medications Prior to Visit  Medication Sig Dispense Refill   amphetamine -dextroamphetamine  (ADDERALL  XR) 30 MG 24 hr capsule Take 1 capsule (30 mg total) by mouth daily. 30 capsule 0   buPROPion  (WELLBUTRIN  XL) 150 MG 24 hr tablet TAKE 1 TABLET BY MOUTH EVERY DAY IN THE MORNING 90 tablet 1   cyclobenzaprine  (FLEXERIL ) 10 MG tablet TAKE 1 TABLET BY MOUTH EVERY DAY AS NEEDED 30 tablet 1   DULoxetine  (CYMBALTA ) 60 MG capsule Take 1 capsule (60 mg total) by mouth daily. 90 capsule 4   eletriptan  (RELPAX ) 40 MG tablet Take 1 tablet (40 mg total) by mouth as needed for migraine or headache. May repeat in 2 hours if headache persists or  recurs. 10 tablet 6   ELIQUIS  5 MG TABS tablet TAKE 1 TABLET BY MOUTH TWICE A DAY 60 tablet 2   Ferrous Sulfate  Dried 143 (45 Fe) MG TBCR Take 45 mg of iron  by mouth.     gabapentin  (NEURONTIN ) 400 MG capsule Take 400 mg by mouth 3 (three) times daily.     Galcanezumab -gnlm (EMGALITY ) 120 MG/ML SOAJ Inject 1 Pen into the skin every 30 (thirty) days. 1.12 mL 6   Melatonin 10 MG CAPS Take 10 mg by mouth at bedtime.     Multiple Vitamin (MULTIVITAMIN) tablet Take 1 tablet by mouth at bedtime.     Probiotic Product (FORTIFY PROBIOTIC WOMENS PO) Take 1 capsule by mouth at bedtime.     temazepam  (RESTORIL ) 15 MG capsule TAKE 1 CAPSULE BY  MOUTH AT BEDTIME AS NEEDED FOR SLEEP. 60 capsule 1   VITAMIN D  PO Take 1 capsule by mouth at bedtime.     No facility-administered medications prior to visit.      PHYSICAL EXAM  GENERAL EXAM/CONSTITUTIONAL: Vitals:  There were no vitals filed for this visit.  There is no height or weight on file to calculate BMI. Wt Readings from Last 3 Encounters:  09/16/24 197 lb (89.4 kg)  09/02/24 198 lb (89.8 kg)  07/22/24 197 lb (89.4 kg)   Patient is in no distress; well developed, nourished and groomed; neck is supple  CARDIOVASCULAR: Examination of carotid arteries is normal; no carotid bruits Regular rate and rhythm, no murmurs Examination of peripheral vascular system by observation and palpation is normal  EYES: Ophthalmoscopic exam of optic discs and posterior segments is normal; no papilledema or hemorrhages No results found.  MUSCULOSKELETAL: Gait, strength, tone, movements noted in Neurologic exam below  NEUROLOGIC: MENTAL STATUS:      No data to display         awake, alert, oriented to person, place and time recent and remote memory intact normal attention and concentration language fluent, comprehension intact, naming intact fund of knowledge appropriate  CRANIAL NERVE:  2nd - no papilledema on fundoscopic exam 2nd, 3rd, 4th, 6th - pupils equal and reactive to light, visual fields full to confrontation, extraocular muscles intact, no nystagmus 5th - facial sensation symmetric 7th - facial strength symmetric 8th - hearing intact 9th - palate elevates symmetrically, uvula midline 11th - shoulder shrug symmetric 12th - tongue protrusion midline  MOTOR:  normal bulk and tone, full strength in the BUE, BLE  SENSORY:  normal and symmetric to light touch, temperature, vibration  COORDINATION:  finger-nose-finger, fine finger movements normal  REFLEXES:  deep tendon reflexes TRACE and symmetric  GAIT/STATION:  narrow based gait     DIAGNOSTIC  DATA (LABS, IMAGING, TESTING) - I reviewed patient records, labs, notes, testing and imaging myself where available.  Lab Results  Component Value Date   WBC 4.9 09/02/2024   HGB 12.3 09/02/2024   HCT 39.1 09/02/2024   MCV 92.2 09/02/2024   PLT 331 09/02/2024      Component Value Date/Time   NA 137 09/02/2024 1015   NA 140 04/02/2024 1026   K 4.6 09/02/2024 1015   CL 102 09/02/2024 1015   CO2 26 09/02/2024 1015   GLUCOSE 115 (H) 09/02/2024 1015   BUN 9 09/02/2024 1015   BUN 11 04/02/2024 1026   CREATININE 0.70 09/02/2024 1015   CREATININE 0.60 04/06/2016 1700   CALCIUM  9.5 09/02/2024 1015   PROT 7.2 09/02/2024 1015   PROT 7.0 04/02/2024 1026  ALBUMIN  4.4 09/02/2024 1015   ALBUMIN  4.2 04/02/2024 1026   AST 38 09/02/2024 1015   ALT 31 09/02/2024 1015   ALKPHOS 211 (H) 09/02/2024 1015   BILITOT 0.2 09/02/2024 1015   GFRNONAA >60 09/02/2024 1015   GFRNONAA >89 04/06/2016 1700   GFRAA >60 04/23/2019 0105   GFRAA >89 04/06/2016 1700   Lab Results  Component Value Date   CHOL 168 04/02/2024   HDL 90 04/02/2024   LDLCALC 68 04/02/2024   TRIG 45 04/02/2024   CHOLHDL 1.9 04/02/2024   Lab Results  Component Value Date   HGBA1C 5.6 01/22/2024   Lab Results  Component Value Date   VITAMINB12 808 01/25/2024   Lab Results  Component Value Date   TSH 1.010 07/07/2024       ASSESSMENT AND PLAN  43 y.o. year old female here with history of gastric bypass, iron  deficiency who presents for evaluation of migraines.   Meds tried:                                Rescue: Toradol  Fioricet  Flexeril  Imitrex Maxalt 10 mg PRN Nurtec - helpful Ubrelvy - lack of efficacy   Preventive: Propranolol - lightheadedness Topamax - paresthesias Qulipta  - helpful Emgality  Zonisamide Celexa Sertraline  Aimovig  - lack of efficacy Trigger point injections  Dx:  1. Intractable migraine without aura and without status migrainosus       PLAN:  MIGRAINE WITH AURA  TREATMENT PLAN:  MIGRAINE PREVENTION  LIFESTYLE CHANGES -Stop or avoid smoking -Decrease or avoid caffeine  / alcohol -Eat and sleep on a regular schedule -Exercise several times per week - stop galazanezumab (Emgality ) 120mg  monthly - start quilpta 60mg  daily - continue duloxetine  60mg  daily   MIGRAINE RESCUE  - ibuprofen , tylenol  as needed - cyclobenzaprine  as needed - fioricet  as needed (last resort; started by PCP) - continue rimegepant (Nurtec) 75mg  as needed for breakthrough headache; max 8 per month - continue eletriptan  (Relpax ) 40mg  as needed for breakthrough headache; may repeat x 1 after 2 hours; max 2 tabs per day or 8 per month - next steps: consider Botox, alternate CGRP (ajovy, qulipta )  Meds ordered this encounter  Medications   Atogepant  (QULIPTA ) 60 MG TABS    Sig: Take 1 tablet (60 mg total) by mouth daily.    Dispense:  30 tablet    Refill:  12   Return in about 4 months (around 01/19/2025) for MyChart visit (15 min).   Virtual Visit via TELEPHONE  I connected with Jessica Banks on 05/19/24 at  3:30 PM EDT by telephone. No charge as video application was not connecting.   I spent 15 minutes of face-to-face and non-face-to-face time with patient.  This included previsit chart review, lab review, study review, order entry, electronic health record documentation, patient education.      EDUARD FABIENE HANLON, MD 09/18/2024, 4:22 PM Certified in Neurology, Neurophysiology and Neuroimaging  [facility] [address], [address] [place], [place] [address] [phone]

## 2024-09-23 ENCOUNTER — Encounter: Payer: Self-pay | Admitting: *Deleted

## 2024-10-02 ENCOUNTER — Telehealth: Payer: Self-pay

## 2024-10-02 ENCOUNTER — Other Ambulatory Visit (HOSPITAL_COMMUNITY): Payer: Self-pay

## 2024-10-02 NOTE — Telephone Encounter (Signed)
 Pharmacy Patient Advocate Encounter   Received notification from CoverMyMeds that prior authorization for Qulipta  60mg  tbalets is required/requested.   Insurance verification completed.   The patient is insured through [employer] [facility].   Per test claim: PA required; PA submitted to above mentioned insurance via CoverMyMeds Key/confirmation #/EOC [id number] Status is pending

## 2024-10-03 NOTE — Telephone Encounter (Signed)
 Pharmacy Patient Advocate Encounter  Received notification from [employer] [facility] that Prior Authorization for Qulipta  has been APPROVED from 10/02/2024 to 10/02/2025   PA #/Case ID/Reference #: [id number]

## 2024-11-04 ENCOUNTER — Ambulatory Visit: Payer: Self-pay | Admitting: Internal Medicine

## 2024-11-12 ENCOUNTER — Other Ambulatory Visit: Payer: Self-pay | Admitting: Internal Medicine

## 2024-11-22 ENCOUNTER — Other Ambulatory Visit: Payer: Self-pay | Admitting: Internal Medicine

## 2024-12-15 ENCOUNTER — Other Ambulatory Visit: Payer: Self-pay | Admitting: Internal Medicine

## 2024-12-15 ENCOUNTER — Encounter: Payer: Self-pay | Admitting: Internal Medicine

## 2024-12-19 ENCOUNTER — Other Ambulatory Visit: Payer: Self-pay | Admitting: Internal Medicine

## 2024-12-19 ENCOUNTER — Other Ambulatory Visit: Payer: Self-pay

## 2024-12-19 MED ORDER — TEMAZEPAM 15 MG PO CAPS: ORAL_CAPSULE | ORAL | 1 refills | Status: DC

## 2024-12-22 ENCOUNTER — Encounter: Payer: Self-pay | Admitting: Obstetrics and Gynecology

## 2024-12-22 ENCOUNTER — Other Ambulatory Visit: Payer: Self-pay | Admitting: Internal Medicine

## 2024-12-22 MED ORDER — TEMAZEPAM 15 MG PO CAPS: ORAL_CAPSULE | ORAL | 1 refills | Status: AC

## 2024-12-30 ENCOUNTER — Other Ambulatory Visit: Payer: Self-pay

## 2024-12-30 ENCOUNTER — Encounter: Payer: Self-pay | Admitting: Family

## 2024-12-30 ENCOUNTER — Inpatient Hospital Stay: Admitting: Family

## 2024-12-30 ENCOUNTER — Inpatient Hospital Stay: Attending: Hematology & Oncology

## 2024-12-30 VITALS — BP 138/84 | HR 102 | Temp 98.0°F | Resp 19 | Ht 60.0 in | Wt 209.4 lb

## 2024-12-30 DIAGNOSIS — R200 Anesthesia of skin: Secondary | ICD-10-CM | POA: Diagnosis not present

## 2024-12-30 DIAGNOSIS — D509 Iron deficiency anemia, unspecified: Secondary | ICD-10-CM | POA: Insufficient documentation

## 2024-12-30 DIAGNOSIS — I2699 Other pulmonary embolism without acute cor pulmonale: Secondary | ICD-10-CM

## 2024-12-30 DIAGNOSIS — Z886 Allergy status to analgesic agent status: Secondary | ICD-10-CM | POA: Diagnosis not present

## 2024-12-30 DIAGNOSIS — R5383 Other fatigue: Secondary | ICD-10-CM | POA: Diagnosis not present

## 2024-12-30 DIAGNOSIS — Z7901 Long term (current) use of anticoagulants: Secondary | ICD-10-CM | POA: Diagnosis not present

## 2024-12-30 DIAGNOSIS — R202 Paresthesia of skin: Secondary | ICD-10-CM | POA: Insufficient documentation

## 2024-12-30 DIAGNOSIS — Z86711 Personal history of pulmonary embolism: Secondary | ICD-10-CM | POA: Insufficient documentation

## 2024-12-30 LAB — IRON AND IRON BINDING CAPACITY (CC-WL,HP ONLY)
Iron: 79 ug/dL (ref 28–170)
Saturation Ratios: 20 % (ref 10.4–31.8)
TIBC: 398 ug/dL (ref 250–450)
UIBC: 319 ug/dL

## 2024-12-30 LAB — CMP (CANCER CENTER ONLY)
ALT: 45 U/L — ABNORMAL HIGH (ref 0–44)
AST: 44 U/L — ABNORMAL HIGH (ref 15–41)
Albumin: 4.5 g/dL (ref 3.5–5.0)
Alkaline Phosphatase: 231 U/L — ABNORMAL HIGH (ref 38–126)
Anion gap: 11 (ref 5–15)
BUN: 6 mg/dL (ref 6–20)
CO2: 26 mmol/L (ref 22–32)
Calcium: 9.5 mg/dL (ref 8.9–10.3)
Chloride: 100 mmol/L (ref 98–111)
Creatinine: 0.67 mg/dL (ref 0.44–1.00)
GFR, Estimated: >60 mL/min
Glucose, Bld: 131 mg/dL — ABNORMAL HIGH (ref 70–99)
Potassium: 4 mmol/L (ref 3.5–5.1)
Sodium: 138 mmol/L (ref 135–145)
Total Bilirubin: 0.3 mg/dL (ref 0.0–1.2)
Total Protein: 7.5 g/dL (ref 6.5–8.1)

## 2024-12-30 LAB — CBC WITH DIFFERENTIAL (CANCER CENTER ONLY)
Abs Immature Granulocytes: 0.01 K/uL (ref 0.00–0.07)
Basophils Absolute: 0 K/uL (ref 0.0–0.1)
Basophils Relative: 1 %
Eosinophils Absolute: 0.1 K/uL (ref 0.0–0.5)
Eosinophils Relative: 2 %
HCT: 39.1 % (ref 36.0–46.0)
Hemoglobin: 12.3 g/dL (ref 12.0–15.0)
Immature Granulocytes: 0 %
Lymphocytes Relative: 36 %
Lymphs Abs: 2.2 K/uL (ref 0.7–4.0)
MCH: 29.4 pg (ref 26.0–34.0)
MCHC: 31.5 g/dL (ref 30.0–36.0)
MCV: 93.3 fL (ref 80.0–100.0)
Monocytes Absolute: 0.3 K/uL (ref 0.1–1.0)
Monocytes Relative: 6 %
Neutro Abs: 3.3 K/uL (ref 1.7–7.7)
Neutrophils Relative %: 55 %
Platelet Count: 348 K/uL (ref 150–400)
RBC: 4.19 MIL/uL (ref 3.87–5.11)
RDW: 13.5 % (ref 11.5–15.5)
WBC Count: 6 K/uL (ref 4.0–10.5)
nRBC: 0 % (ref 0.0–0.2)

## 2024-12-30 LAB — FERRITIN: Ferritin: 20 ng/mL (ref 11–307)

## 2024-12-30 MED ORDER — APIXABAN 2.5 MG PO TABS: 2.5000 mg | ORAL_TABLET | Freq: Two times a day (BID) | ORAL | 6 refills | Status: AC

## 2024-12-30 NOTE — Progress Notes (Signed)
 " Hematology and Oncology Follow Up Visit  Jessica Banks [id number] August 28, 1981 43 y.o. 12/30/2024   Principle Diagnosis:  Pulmonary embolism - resolved on CTA 05/2024 Iron  deficiency anemia    Current Therapy:        Eliquis  5 mg PO BID IV iron  as indicated    Interim History:  Jessica Banks is here today for follow-up. She is still having heavy cycles and is symptomatic with fatigue, occasional dizziness and positional numbness and tingling in her right hand and feet.  She has not craved any ice so far.  No other blood loss noted outside of her cycle. I did fax her clearance to gynecology on 12/23/2024 for endometrial ablation. She is still waiting to schedule.   She will hold her Eliquis  starting 2 days prior to procedure and restart the day after. She will let us  know if there is any issue with abnormal blood loss with or after her procedure. She verbalized understanding on how to hold medication and restart.  No fever, chills, n/v, cough, rash, SOB, chest pain, palpitations, abdominal pain or changes in bowel or bladder habits.   No swelling in her extremities.  No falls or syncope reported.  Appetite and hydration are good. Weight is stable at 209 lbs.   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 If you have any questions, ask your nurse or doctor.          amphetamine -dextroamphetamine  10 MG tablet Commonly known as: ADDERALL  Take 10 mg by mouth 1 day or 1 dose. What changed: additional instructions   amphetamine -dextroamphetamine  30 MG 24 hr capsule Commonly known as: ADDERALL  XR Take 1 capsule (30 mg total) by mouth daily. What changed: Another medication with the same name  was changed. Make sure you understand how and when to take each.   buPROPion  150 MG 24 hr tablet Commonly known as: WELLBUTRIN  XL TAKE 1 TABLET BY MOUTH EVERY DAY IN THE MORNING   cyclobenzaprine  10 MG tablet Commonly known as: FLEXERIL  TAKE 1 TABLET BY MOUTH EVERY DAY AS NEEDED   DULoxetine  60 MG capsule Commonly known as: Cymbalta  Take 1 capsule (60 mg total) by mouth daily.   eletriptan  40 MG tablet Commonly known as: RELPAX  Take 1 tablet (40 mg total) by mouth as needed for migraine or headache. May repeat in 2 hours if headache persists or recurs.   Eliquis  5 MG Tabs tablet Generic drug: apixaban  TAKE 1 TABLET BY MOUTH TWICE A DAY   Emgality  120 MG/ML Soaj Generic drug: Galcanezumab -gnlm Inject 1 Pen into the skin every 30 (thirty) days.   Ferrous Sulfate  Dried 143 (45 Fe) MG Tbcr Take 45 mg of iron  by mouth.   FORTIFY PROBIOTIC WOMENS PO Take 1 capsule by mouth at bedtime.   gabapentin  400 MG capsule Commonly known as: NEURONTIN  Take 400 mg by mouth 3 (three) times daily.   Melatonin 10 MG Caps Take 10 mg by mouth at bedtime.   multivitamin tablet Take 1 tablet by mouth at bedtime.   Qulipta  60 MG Tabs Generic drug: Atogepant  Take 1 tablet (60 mg total) by mouth  daily.   temazepam  15 MG capsule Commonly known as: RESTORIL  TAKE 1 CAPSULE BY MOUTH AT BEDTIME AS NEEDED FOR SLEEP.   VITAMIN D  PO Take 1 capsule by mouth at bedtime.        Allergies: Allergies[1]  Past Medical History, Surgical history, Social history, and Family History were reviewed and updated.  Review of Systems: All other 10 point review of systems is negative.   Physical Exam:  height is 5' (1.524 m) and weight is 209 lb 6.4 oz (95 kg). Her oral temperature is 98 F (36.7 C). Her blood pressure is 138/84 and her pulse is 102 (abnormal). Her respiration is 19 and oxygen saturation is 98%.   Wt Readings from Last 3 Encounters:  12/30/24 209 lb 6.4 oz (95 kg)  09/16/24 197 lb  (89.4 kg)  09/02/24 198 lb (89.8 kg)    Ocular: Sclerae unicteric, pupils equal, round and reactive to light Ear-nose-throat: Oropharynx clear, dentition fair Lymphatic: No cervical or supraclavicular adenopathy Lungs no rales or rhonchi, good excursion bilaterally Heart regular rate and rhythm, no murmur appreciated Abd soft, nontender, positive bowel sounds MSK no focal spinal tenderness, no joint edema Neuro: non-focal, well-oriented, appropriate affect Breasts: Deferred   Lab Results  Component Value Date   WBC 6.0 12/30/2024   HGB 12.3 12/30/2024   HCT 39.1 12/30/2024   MCV 93.3 12/30/2024   PLT 348 12/30/2024   Lab Results  Component Value Date   FERRITIN 44 09/02/2024   IRON  76 09/02/2024   TIBC 357 09/02/2024   UIBC 281 09/02/2024   IRONPCTSAT 21 09/02/2024   Lab Results  Component Value Date   RETICCTPCT 1.5 06/02/2024   RBC 4.19 12/30/2024   No results found for: KPAFRELGTCHN, LAMBDASER, KAPLAMBRATIO No results found for: IGGSERUM, IGA, IGMSERUM No results found for: STEPHANY CARLOTA BENSON MARKEL EARLA JOANNIE DOC VICK, SPEI   Chemistry      Component Value Date/Time   NA 137 09/02/2024 1015   NA 140 04/02/2024 1026   K 4.6 09/02/2024 1015   CL 102 09/02/2024 1015   CO2 26 09/02/2024 1015   BUN 9 09/02/2024 1015   BUN 11 04/02/2024 1026   CREATININE 0.70 09/02/2024 1015   CREATININE 0.60 04/06/2016 1700   GLU 108 09/23/2023 0000      Component Value Date/Time   CALCIUM  9.5 09/02/2024 1015   ALKPHOS 211 (H) 09/02/2024 1015   AST 38 09/02/2024 1015   ALT 31 09/02/2024 1015   BILITOT 0.2 09/02/2024 1015       Impression and Plan: Jessica Banks is a very pleasant 44 yo caucasian female with IDA as well as PE involving multiple upper lobe branches of the right pulmonary artery without evidence of right heart strain post surgery (internal hernia repair and cholecystectomy).  She has now completed 1 year of  full dose anticoagulation. June 2025 CTA showed resolution of PE. We will now reduce her Eliquis  to maintenance 2.5 mg PO BID.   Hyper coag panel was negative.  She is cleared from our standpoint for endometrial ablation. She will hold Eliquis  starting 2 days prior to procedure and restart the day after.  Iron  studies are pending. We will replace if needed.  Follow-up in another 3 months.    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

## 2025-01-05 ENCOUNTER — Telehealth: Payer: Self-pay | Admitting: Family

## 2025-01-05 NOTE — Telephone Encounter (Signed)
 Called to schedule infusions per inbasket. LVM to return call for scheduling.

## 2025-01-07 ENCOUNTER — Telehealth: Payer: Self-pay | Admitting: Family

## 2025-01-07 NOTE — Telephone Encounter (Signed)
 Called to schedule infusions per inbasket. LVM to return call for scheduling.

## 2025-01-09 ENCOUNTER — Telehealth: Payer: Self-pay | Admitting: Family

## 2025-01-09 NOTE — Telephone Encounter (Signed)
 Called to schedule infusions per inbasket. LVM to return call for scheduling. 3rd attempt.

## 2025-01-11 ENCOUNTER — Other Ambulatory Visit: Payer: Self-pay | Admitting: Diagnostic Neuroimaging

## 2025-01-12 NOTE — Telephone Encounter (Signed)
 Last refilled by patient on : 12/17/24 Last office visit : 09/18/24 Next office visit : Return in about 4 months (due back around 01/19/2025) Per last office note - start quilpta 60mg  daily

## 2025-01-14 ENCOUNTER — Other Ambulatory Visit: Payer: Self-pay | Admitting: Family

## 2025-01-15 ENCOUNTER — Encounter: Payer: Self-pay | Admitting: Family

## 2025-01-15 ENCOUNTER — Other Ambulatory Visit (HOSPITAL_COMMUNITY): Payer: Self-pay

## 2025-01-15 ENCOUNTER — Encounter: Payer: Self-pay | Admitting: Diagnostic Neuroimaging

## 2025-01-15 ENCOUNTER — Telehealth: Payer: Self-pay

## 2025-01-15 NOTE — Telephone Encounter (Signed)
 Received notification from [facility] that Prior Authorization for Qulipta  has been APPROVED from 10/02/2024 to 10/02/2025   I spoke to the pharmacy and it is still saying the patient needs a PA. Please advise.

## 2025-01-15 NOTE — Telephone Encounter (Signed)
 PA is still active. Called and spoke with Deanna at pharmacy. They are getting it filled #30 tablets $0 copay.

## 2025-01-15 NOTE — Telephone Encounter (Signed)
Thank you patient has been notified.

## 2025-01-20 ENCOUNTER — Inpatient Hospital Stay: Attending: Hematology & Oncology

## 2025-01-20 VITALS — BP 137/90 | HR 103 | Resp 18

## 2025-01-20 DIAGNOSIS — D509 Iron deficiency anemia, unspecified: Secondary | ICD-10-CM

## 2025-01-20 MED ORDER — IRON SUCROSE 300 MG IVPB - SIMPLE MED
300.0000 mg | Freq: Once | Status: AC
  Administered 2025-01-20: 300 mg via INTRAVENOUS
  Filled 2025-01-20: qty 300

## 2025-01-20 MED ORDER — SODIUM CHLORIDE 0.9 % IV SOLN: INTRAVENOUS | Status: DC

## 2025-01-20 NOTE — Patient Instructions (Signed)
 Iron  Sucrose Injection What is this medication? IRON  SUCROSE (EYE ern SOO krose) treats low levels of iron  (iron  deficiency anemia) in people with kidney disease. Iron  is a mineral that plays an important role in making red blood cells, which carry oxygen from your lungs to the rest of your body. This medicine may be used for other purposes; ask your health care provider or pharmacist if you have questions. COMMON BRAND NAME(S): Venofer  What should I tell my care team before I take this medication? They need to know if you have any of these conditions: Anemia not caused by low iron  levels Heart disease High levels of iron  in the blood Kidney disease Liver disease An unusual or allergic reaction to iron , other medications, foods, dyes, or preservatives Pregnant or trying to get pregnant Breastfeeding How should I use this medication? This medication is infused into a vein. It is given by your care team in a hospital or clinic setting. Talk to your care team about the use of this medication in children. While it may be prescribed for children as young as 2 years for selected conditions, precautions do apply. Overdosage: If you think you have taken too much of this medicine contact a poison control center or emergency room at once. NOTE: This medicine is only for you. Do not share this medicine with others. What if I miss a dose? Keep appointments for follow-up doses. It is important not to miss your dose. Call your care team if you are unable to keep an appointment. What may interact with this medication? Do not take this medication with any of the following: Deferoxamine Dimercaprol Other iron  products This medication may also interact with the following: Chloramphenicol Deferasirox This list may not describe all possible interactions. Give your health care provider a list of all the medicines, herbs, non-prescription drugs, or dietary supplements you use. Also tell them if you smoke,  drink alcohol, or use illegal drugs. Some items may interact with your medicine. What should I watch for while using this medication? Your condition will be monitored carefully while you are receiving this medication. Tell your care team if your symptoms do not start to get better or if they get worse. You may need blood work done while you are taking this medication. Sometimes, when medications are infused into veins, a little can leak out of the vein and into the tissue around it. If this medication leaks, it can cause a brown or dark stain on the skin. This is not common. It may be permanent. If you feel pain or swelling during your infusion, tell your care team right away. They can stop the infusion and treat the area. You may need to eat more foods that contain iron . Talk to your care team. Foods that contain iron  include whole grains or cereals, dried fruits, beans, peas, leafy green vegetables, and organ meats (liver, kidney). What side effects may I notice from receiving this medication? Side effects that you should report to your care team as soon as possible: Allergic reactions--skin rash, itching, hives, swelling of the face, lips, tongue, or throat Low blood pressure--dizziness, feeling faint or lightheaded, blurry vision Painful swelling, warmth, or redness of the skin, brown or dark skin color at the infusion site Shortness of breath Side effects that usually do not require medical attention (report these to your care team if they continue or are bothersome): Flushing Headache Joint pain Muscle pain Nausea This list may not describe all possible side effects. Call your  doctor for medical advice about side effects. You may report side effects to FDA at 1-800-FDA-1088. Where should I keep my medication? This medication is given in a hospital or clinic. It will not be stored at home. NOTE: This sheet is a summary. It may not cover all possible information. If you have questions about  this medicine, talk to your doctor, pharmacist, or health care provider.  2025 Elsevier/Gold Standard (2024-10-22 00:00:00)

## 2025-01-27 ENCOUNTER — Inpatient Hospital Stay

## 2025-02-03 ENCOUNTER — Inpatient Hospital Stay

## 2025-03-31 ENCOUNTER — Inpatient Hospital Stay

## 2025-03-31 ENCOUNTER — Inpatient Hospital Stay: Admitting: Family

## 2025-04-08 ENCOUNTER — Encounter: Payer: Self-pay | Admitting: Internal Medicine
